# Patient Record
Sex: Male | Born: 1965 | Race: White | Hispanic: No | Marital: Married | Smoking: Former smoker
Health system: Southern US, Community
[De-identification: ages and names within clinical notes are randomized; demographics above are authoritative.]

## PROBLEM LIST (undated history)

## (undated) DIAGNOSIS — F32A Depression, unspecified: Secondary | ICD-10-CM

## (undated) DIAGNOSIS — I350 Nonrheumatic aortic (valve) stenosis: Secondary | ICD-10-CM

## (undated) DIAGNOSIS — R0600 Dyspnea, unspecified: Secondary | ICD-10-CM

## (undated) DIAGNOSIS — I2510 Atherosclerotic heart disease of native coronary artery without angina pectoris: Secondary | ICD-10-CM

## (undated) DIAGNOSIS — Z9109 Other allergy status, other than to drugs and biological substances: Secondary | ICD-10-CM

## (undated) DIAGNOSIS — I77810 Thoracic aortic ectasia: Secondary | ICD-10-CM

## (undated) DIAGNOSIS — I779 Disorder of arteries and arterioles, unspecified: Secondary | ICD-10-CM

## (undated) DIAGNOSIS — E669 Obesity, unspecified: Secondary | ICD-10-CM

## (undated) DIAGNOSIS — I471 Supraventricular tachycardia, unspecified: Secondary | ICD-10-CM

## (undated) DIAGNOSIS — G4730 Sleep apnea, unspecified: Secondary | ICD-10-CM

## (undated) DIAGNOSIS — E7800 Pure hypercholesterolemia, unspecified: Secondary | ICD-10-CM

## (undated) DIAGNOSIS — I739 Peripheral vascular disease, unspecified: Secondary | ICD-10-CM

## (undated) DIAGNOSIS — I10 Essential (primary) hypertension: Secondary | ICD-10-CM

## (undated) DIAGNOSIS — I499 Cardiac arrhythmia, unspecified: Secondary | ICD-10-CM

## (undated) DIAGNOSIS — R519 Headache, unspecified: Secondary | ICD-10-CM

## (undated) DIAGNOSIS — E119 Type 2 diabetes mellitus without complications: Secondary | ICD-10-CM

## (undated) DIAGNOSIS — M1990 Unspecified osteoarthritis, unspecified site: Secondary | ICD-10-CM

## (undated) DIAGNOSIS — R011 Cardiac murmur, unspecified: Secondary | ICD-10-CM

## (undated) DIAGNOSIS — F4310 Post-traumatic stress disorder, unspecified: Secondary | ICD-10-CM

## (undated) HISTORY — DX: Thoracic aortic ectasia: I77.810

## (undated) HISTORY — DX: Other allergy status, other than to drugs and biological substances: Z91.09

## (undated) HISTORY — DX: Nonrheumatic aortic (valve) stenosis: I35.0

## (undated) HISTORY — DX: Atherosclerotic heart disease of native coronary artery without angina pectoris: I25.10

## (undated) HISTORY — PX: TONSILLECTOMY: SUR1361

## (undated) HISTORY — DX: Obesity, unspecified: E66.9

## (undated) HISTORY — PX: OTHER SURGICAL HISTORY: SHX169

---

## 2007-05-31 DIAGNOSIS — N529 Male erectile dysfunction, unspecified: Secondary | ICD-10-CM | POA: Insufficient documentation

## 2007-06-01 DIAGNOSIS — D751 Secondary polycythemia: Secondary | ICD-10-CM | POA: Insufficient documentation

## 2010-04-11 ENCOUNTER — Emergency Department (HOSPITAL_COMMUNITY): Admission: EM | Admit: 2010-04-11 | Discharge: 2010-04-11 | Payer: Self-pay | Admitting: Emergency Medicine

## 2010-09-19 LAB — BASIC METABOLIC PANEL
BUN: 23 mg/dL (ref 6–23)
CO2: 22 mEq/L (ref 19–32)
Calcium: 9.5 mg/dL (ref 8.4–10.5)
Chloride: 104 mEq/L (ref 96–112)
Creatinine, Ser: 1.16 mg/dL (ref 0.4–1.5)
GFR calc Af Amer: >60 mL/min (ref >60)
GFR calc non Af Amer: >60 mL/min (ref >60)
Glucose, Bld: 127 mg/dL — ABNORMAL HIGH (ref 70–99)
Potassium: 3.9 mEq/L (ref 3.5–5.1)
Sodium: 136 mEq/L (ref 135–145)

## 2010-09-19 LAB — DIFFERENTIAL
Basophils Absolute: 0 10*3/uL (ref 0.0–0.1)
Basophils Relative: 0 % (ref 0–1)
Eosinophils Absolute: 0.1 10*3/uL (ref 0.0–0.7)
Eosinophils Relative: 1 % (ref 0–5)
Lymphocytes Relative: 30 % (ref 12–46)
Lymphs Abs: 3.7 10*3/uL (ref 0.7–4.0)
Monocytes Absolute: 0.9 10*3/uL (ref 0.1–1.0)
Monocytes Relative: 8 % (ref 3–12)
Neutro Abs: 7.6 10*3/uL (ref 1.7–7.7)
Neutrophils Relative %: 61 % (ref 43–77)

## 2010-09-19 LAB — CBC
HCT: 46.3 % (ref 39.0–52.0)
Hemoglobin: 16 g/dL (ref 13.0–17.0)
MCH: 31.7 pg (ref 26.0–34.0)
MCHC: 34.6 g/dL (ref 30.0–36.0)
MCV: 91.5 fL (ref 78.0–100.0)
Platelets: 254 10*3/uL (ref 150–400)
RBC: 5.05 MIL/uL (ref 4.22–5.81)
RDW: 14.1 % (ref 11.5–15.5)
WBC: 12.4 10*3/uL — ABNORMAL HIGH (ref 4.0–10.5)

## 2010-09-19 LAB — CK TOTAL AND CKMB (NOT AT ARMC)
CK, MB: 3.6 ng/mL (ref 0.3–4.0)
Relative Index: 1 (ref 0.0–2.5)
Total CK: 350 U/L — ABNORMAL HIGH (ref 7–232)

## 2010-09-19 LAB — TROPONIN I: Troponin I: 0.04 ng/mL (ref 0.00–0.06)

## 2010-12-13 ENCOUNTER — Emergency Department (HOSPITAL_COMMUNITY)
Admission: EM | Admit: 2010-12-13 | Discharge: 2010-12-14 | Disposition: A | Discharge status: Home / Self Care | Payer: BC Managed Care – PPO | Attending: Emergency Medicine | Admitting: Emergency Medicine

## 2010-12-13 DIAGNOSIS — I498 Other specified cardiac arrhythmias: Secondary | ICD-10-CM | POA: Insufficient documentation

## 2010-12-13 DIAGNOSIS — E7800 Pure hypercholesterolemia, unspecified: Secondary | ICD-10-CM | POA: Insufficient documentation

## 2010-12-13 DIAGNOSIS — I10 Essential (primary) hypertension: Secondary | ICD-10-CM | POA: Insufficient documentation

## 2010-12-13 DIAGNOSIS — R079 Chest pain, unspecified: Secondary | ICD-10-CM | POA: Insufficient documentation

## 2010-12-13 DIAGNOSIS — R61 Generalized hyperhidrosis: Secondary | ICD-10-CM | POA: Insufficient documentation

## 2010-12-13 LAB — POCT I-STAT, CHEM 8
BUN: 21 mg/dL (ref 6–23)
Calcium, Ion: 1.12 mmol/L (ref 1.12–1.32)
Chloride: 103 mEq/L (ref 96–112)
Creatinine, Ser: 1.4 mg/dL (ref 0.4–1.5)
Glucose, Bld: 123 mg/dL — ABNORMAL HIGH (ref 70–99)
HCT: 47 % (ref 39.0–52.0)
Hemoglobin: 16 g/dL (ref 13.0–17.0)
Potassium: 3.6 mEq/L (ref 3.5–5.1)
Sodium: 139 mEq/L (ref 135–145)
TCO2: 24 mmol/L (ref 0–100)

## 2011-06-03 ENCOUNTER — Emergency Department (HOSPITAL_BASED_OUTPATIENT_CLINIC_OR_DEPARTMENT_OTHER)
Admission: EM | Admit: 2011-06-03 | Discharge: 2011-06-03 | Disposition: A | Discharge status: Home / Self Care | Payer: BC Managed Care – PPO | Attending: Emergency Medicine | Admitting: Emergency Medicine

## 2011-06-03 ENCOUNTER — Encounter: Payer: Self-pay | Admitting: Student

## 2011-06-03 DIAGNOSIS — Z79899 Other long term (current) drug therapy: Secondary | ICD-10-CM | POA: Insufficient documentation

## 2011-06-03 DIAGNOSIS — I471 Supraventricular tachycardia: Secondary | ICD-10-CM

## 2011-06-03 DIAGNOSIS — I498 Other specified cardiac arrhythmias: Secondary | ICD-10-CM | POA: Insufficient documentation

## 2011-06-03 DIAGNOSIS — I10 Essential (primary) hypertension: Secondary | ICD-10-CM | POA: Insufficient documentation

## 2011-06-03 DIAGNOSIS — E7800 Pure hypercholesterolemia, unspecified: Secondary | ICD-10-CM | POA: Insufficient documentation

## 2011-06-03 HISTORY — DX: Essential (primary) hypertension: I10

## 2011-06-03 HISTORY — DX: Pure hypercholesterolemia, unspecified: E78.00

## 2011-06-03 HISTORY — DX: Supraventricular tachycardia: I47.1

## 2011-06-03 HISTORY — DX: Supraventricular tachycardia, unspecified: I47.10

## 2011-06-03 LAB — BASIC METABOLIC PANEL
BUN: 20 mg/dL (ref 6–23)
CO2: 25 mEq/L (ref 19–32)
Calcium: 9.5 mg/dL (ref 8.4–10.5)
Chloride: 101 mEq/L (ref 96–112)
Creatinine, Ser: 1 mg/dL (ref 0.50–1.35)
GFR calc Af Amer: >90 mL/min (ref >90)
GFR calc non Af Amer: 89 mL/min — ABNORMAL LOW (ref >90)
Glucose, Bld: 127 mg/dL — ABNORMAL HIGH (ref 70–99)
Potassium: 3.8 mEq/L (ref 3.5–5.1)
Sodium: 137 mEq/L (ref 135–145)

## 2011-06-03 LAB — CBC
HCT: 46.5 % (ref 39.0–52.0)
Hemoglobin: 16.2 g/dL (ref 13.0–17.0)
MCH: 30.5 pg (ref 26.0–34.0)
MCHC: 34.8 g/dL (ref 30.0–36.0)
MCV: 87.6 fL (ref 78.0–100.0)
Platelets: 216 10*3/uL (ref 150–400)
RBC: 5.31 MIL/uL (ref 4.22–5.81)
RDW: 13.5 % (ref 11.5–15.5)
WBC: 10.3 10*3/uL (ref 4.0–10.5)

## 2011-06-03 LAB — DIFFERENTIAL
Basophils Absolute: 0.1 10*3/uL (ref 0.0–0.1)
Basophils Relative: 1 % (ref 0–1)
Eosinophils Absolute: 0.2 10*3/uL (ref 0.0–0.7)
Eosinophils Relative: 2 % (ref 0–5)
Lymphocytes Relative: 28 % (ref 12–46)
Lymphs Abs: 2.9 10*3/uL (ref 0.7–4.0)
Monocytes Absolute: 1 10*3/uL (ref 0.1–1.0)
Monocytes Relative: 10 % (ref 3–12)
Neutro Abs: 6.2 10*3/uL (ref 1.7–7.7)
Neutrophils Relative %: 60 % (ref 43–77)

## 2011-06-03 LAB — TROPONIN I: Troponin I: <0.3 ng/mL (ref <0.30)

## 2011-06-03 MED ORDER — DILTIAZEM HCL ER COATED BEADS 180 MG PO CP24: 180.0000 mg | ORAL_CAPSULE | Freq: Every day | ORAL | Status: DC

## 2011-06-03 MED ORDER — ADENOSINE 6 MG/2ML IV SOLN
6.0000 mg | Freq: Once | INTRAVENOUS | Status: AC
  Administered 2011-06-03: 6 mg via INTRAVENOUS

## 2011-06-03 MED ORDER — ADENOSINE 6 MG/2ML IV SOLN
INTRAVENOUS | Status: AC
  Administered 2011-06-03: 6 mg via INTRAVENOUS
  Filled 2011-06-03: qty 2

## 2011-06-03 MED ORDER — SODIUM CHLORIDE 0.9 % IV BOLUS (SEPSIS)
1000.0000 mL | Freq: Once | INTRAVENOUS | Status: AC
  Administered 2011-06-03 (×2): 1000 mL via INTRAVENOUS

## 2011-06-03 NOTE — ED Notes (Signed)
Pt. Reports no pain and is in no resp. Distress.  Pt. Is SR on monitor with no noted ectopy.

## 2011-06-03 NOTE — ED Notes (Signed)
Pt in with c/o SVT starting at 1440 today and reports + cardiac hx of SVT. Take cardizem daily.

## 2011-06-03 NOTE — ED Notes (Signed)
MD at bedside. Crash cart at bedside. Pt placed on monitors with alarms set.

## 2011-06-03 NOTE — ED Provider Notes (Signed)
History     CSN: [id number] Arrival date & time: 06/03/2011  3:20 PM   First MD Initiated Contact with Patient 06/03/11 1521      Chief Complaint  Patient presents with  . Irregular Heart Beat    (Consider location/radiation/quality/duration/timing/severity/associated sxs/prior treatment) HPI Comments: Patient presents with palpitations, tightness in the chest and shortness of breath for the past hour period. 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  He has had SVT in the past and follows with Dr. Katrinka Blazing of [facility] cardiovascular. His last episode was in June he did convert with adenosine. Denies any cough or fever. He states he has a slight twinge of chest tightness doubt this time is in no distress. Denies any nausea, vomiting, abdominal pain. He states his Cardizem was recently increased to 189 mg from 120 but he has not received the new prescription yet.  The history is provided by the patient.    Past Medical History  Diagnosis Date  . SVT (supraventricular tachycardia)   . Hypertension   . Hypercholesterolemia     Past Surgical History  Procedure Date  . Tonsillectomy     History reviewed. No pertinent family history.  History  Substance Use Topics  . Smoking status: Never Smoker   . Smokeless tobacco: Not on file  . Alcohol Use: Yes      Review of Systems  Constitutional: Negative for fever, activity change and appetite change.  HENT: Negative for congestion and rhinorrhea.   Respiratory: Positive for chest tightness. Negative for cough and shortness of breath.   Cardiovascular: Positive for chest pain and palpitations.  Gastrointestinal: Negative for nausea, vomiting and abdominal pain.  Genitourinary: Negative for dysuria and hematuria.  Musculoskeletal: Negative for back pain.  Skin: Negative for rash.  Neurological: Negative for weakness and headaches.    Allergies  Tomato  Home Medications   Current  Outpatient Rx  Name Route Sig Dispense Refill  . ATORVASTATIN CALCIUM 40 MG PO TABS Oral Take 40 mg by mouth daily.      [name] DILTIAZEM HCL COATED BEADS 120 MG PO CP24 Oral Take 120 mg by mouth daily.      [name] DILTIAZEM HCL COATED BEADS 180 MG PO CP24 Oral Take 180 mg by mouth daily.      [name] HYDROCHLOROTHIAZIDE 12.5 MG PO CAPS Oral Take 12.5 mg by mouth daily.      [name] LISINOPRIL 40 MG PO TABS Oral Take 40 mg by mouth daily.      [name] ONE-DAILY MULTI VITAMINS PO TABS Oral Take 1 tablet by mouth daily.      [name] VITAMIN C 500 MG PO TABS Oral Take 500 mg by mouth daily.      [name] DILTIAZEM HCL COATED BEADS 180 MG PO CP24 Oral Take 1 capsule (180 mg total) by mouth daily. 7 capsule 0    BP 120/77  Pulse 77  Temp(Src) 98.1 F (36.7 C) (Oral)  Resp 20  Wt 265 lb (120.203 kg)  SpO2 99%  Physical Exam  Constitutional: He is oriented to person, place, and time. He appears well-developed and well-nourished. He appears distressed.  HENT:  Head: Normocephalic and atraumatic.  Mouth/Throat: Oropharynx is clear and moist.  Eyes: Conjunctivae are normal. Pupils are equal, round, and reactive to light.  Neck: Normal range of motion.  Cardiovascular: Normal rate, regular rhythm and normal heart sounds.        Tachycardic regular rhythm  Pulmonary/Chest: Effort normal  and breath sounds normal.  Abdominal: Soft. There is no tenderness. There is no rebound.  Musculoskeletal: Normal range of motion. He exhibits no edema and no tenderness.  Neurological: He is alert and oriented to person, place, and time. No cranial nerve deficit.  Skin: Skin is warm.    ED Course  Procedures (including critical care time)  Labs Reviewed  BASIC METABOLIC PANEL - Abnormal; Notable for the following:    Glucose, Bld 127 (*)    GFR calc non Af Amer 89 (*)    All other components within normal limits  CBC  DIFFERENTIAL  TROPONIN I   No results found.   1. Supraventricular tachycardia       MDM  Palpitations with  history of SVT. Vital signs stable at this time. SVT confirmed on EKG patient given 6 mg of adenosine and converted to normal sinus rhythm.   Date: 06/03/2011  Rate: 186  Rhythm: supraventricular tachycardia (SVT)  QRS Axis: normal  Intervals: normal  ST/T Wave abnormalities: nonspecific ST/T changes  Conduction Disutrbances:none  Narrative Interpretation:   Old EKG Reviewed: unchanged   Date: 06/03/2011  Rate: 89  Rhythm: normal sinus rhythm  QRS Axis: normal  Intervals: normal  ST/T Wave abnormalities: nonspecific ST/T changes  Conduction Disutrbances:none  Narrative Interpretation:   Old EKG Reviewed: changes noted  Patient remains in NSR with stable vitals and asymptomatic.  D/w Dr. Mayford Knife of [place].  Advises patient to start 180 mg of cardizem, continue other meds and follow up with Dr. Katrinka Blazing.  CRITICAL CARE Performed by: Glynn Octave   Total critical care time: 30  Critical care time was exclusive of separately billable procedures and treating other patients.  Critical care was necessary to treat or prevent imminent or life-threatening deterioration.  Critical care was time spent personally by me on the following activities: development of treatment plan with patient and/or surrogate as well as nursing, discussions with consultants, evaluation of patient's response to treatment, examination of patient, obtaining history from patient or surrogate, ordering and performing treatments and interventions, ordering and review of laboratory studies, ordering and review of radiographic studies, pulse oximetry and re-evaluation of patient's condition.         Glynn Octave, MD 06/03/11 [phone]

## 2011-06-03 NOTE — ED Notes (Signed)
Pt. Received adenosine 6mg  and bolused NS after infusion of Adenosine.  Pt. Converted to SR rate of 89 and was at a rate of 179 in SVT on cardiac monitor.  Pt. Is alert and oriented with no distress noted.  HR 87 sat on 3 liters is 98% and b/p is 134 /79

## 2011-06-03 NOTE — ED Notes (Signed)
Pt. In no distress.  Mother at bedside of Pt.  Cardiac monitor reveals SR with no ectopy

## 2011-08-04 ENCOUNTER — Ambulatory Visit (HOSPITAL_BASED_OUTPATIENT_CLINIC_OR_DEPARTMENT_OTHER): Payer: BC Managed Care – PPO | Attending: Otolaryngology | Admitting: Radiology

## 2011-08-04 VITALS — Ht 73.0 in | Wt 265.0 lb

## 2011-08-04 DIAGNOSIS — G4733 Obstructive sleep apnea (adult) (pediatric): Secondary | ICD-10-CM | POA: Insufficient documentation

## 2011-08-08 ENCOUNTER — Other Ambulatory Visit: Payer: Self-pay | Admitting: *Deleted

## 2011-08-08 ENCOUNTER — Encounter: Payer: Self-pay | Admitting: *Deleted

## 2011-08-08 ENCOUNTER — Ambulatory Visit (INDEPENDENT_AMBULATORY_CARE_PROVIDER_SITE_OTHER): Payer: BC Managed Care – PPO | Admitting: Internal Medicine

## 2011-08-08 ENCOUNTER — Encounter: Payer: Self-pay | Admitting: Internal Medicine

## 2011-08-08 DIAGNOSIS — I471 Supraventricular tachycardia, unspecified: Secondary | ICD-10-CM | POA: Insufficient documentation

## 2011-08-08 DIAGNOSIS — I350 Nonrheumatic aortic (valve) stenosis: Secondary | ICD-10-CM | POA: Insufficient documentation

## 2011-08-08 DIAGNOSIS — I498 Other specified cardiac arrhythmias: Secondary | ICD-10-CM

## 2011-08-08 DIAGNOSIS — I359 Nonrheumatic aortic valve disorder, unspecified: Secondary | ICD-10-CM

## 2011-08-08 NOTE — Assessment & Plan Note (Signed)
The patient has SVT. I initially thought it was A-V reentry but now by ECG I cannot be sure. Given the likelihood in a male is being A-V reentry and high probability if that is the case that it is on the left side, I discussed with Dr. Ladona Ridgel he is doing the procedure because of the potential need for transseptal access. The patient and he are agreeable. We have reviewed potential benefits as well as risks including but not limited to death perforation recurrence and heart block requiring pacemaker implantation he understands these risks and is willing to proceed.

## 2011-08-08 NOTE — Patient Instructions (Signed)
See attached instruction sheet for ablation on 08-21-2011

## 2011-08-08 NOTE — Progress Notes (Signed)
History and Physical  Patient ID: Dalton [name] MRN: [id number], SOB: 1966/01/08 45 y.o. Date of Encounter: 08/08/2011, 3:40 PM  Primary Physician: No primary provider on file. Primary Cardiologist: smith Primary Electrophysiologist:  Graciela Husbands  Chief Complaint: svt  History of Present Illness: Dalton [name] is a 46 y.o. male with a bicuspid aortic valve and moderate aortic stenosis who presents with recurrent SVT over the last 6 months or so. He's had 3 visits to the emergency room for this. They're associated with dyspnea chest tightness and lightheadedness. It converted with adenosine. There frog negative. He does get a pounding in his head.   Past Medical History  Diagnosis Date  . SVT (supraventricular tachycardia)   . Hypertension   . Hypercholesterolemia   . Aortic valve stenosis     Bicuspid     Past Surgical History  Procedure Date  . Tonsillectomy   . Surgery for non descending testicle       Current Outpatient Prescriptions  Medication Sig Dispense Refill  . atorvastatin (LIPITOR) 40 MG tablet Take 40 mg by mouth daily.        [name] diltiazem (DILACOR XR) 240 MG 24 hr capsule Take 240 mg by mouth daily.      . hydrochlorothiazide (MICROZIDE) 12.5 MG capsule Take 12.5 mg by mouth daily.        [name] lisinopril (PRINIVIL,ZESTRIL) 40 MG tablet Take 20 mg by mouth daily.       . Lycopene 10 MG CAPS Take 1 capsule by mouth.      . Multiple Vitamin (MULTIVITAMIN) tablet Take 1 tablet by mouth daily.        . Omega-3 Fatty Acids (FISH OIL) 1000 MG CAPS Take 1 capsule by mouth daily.      . vitamin C (ASCORBIC ACID) 500 MG tablet Take 1,000 mg by mouth daily.          Allergies: Allergies  Allergen Reactions  . Tomato Hives and Itching    Only allergic to raw tomato     History  Substance Use Topics  . Smoking status: Former Smoker    Types: Cigarettes    Quit date: 08/07/2010  . Smokeless tobacco: Never Used  . Alcohol Use: Yes     RED WINE EACH EVENING,  LIQUOR OCCASIONAL      Family History  Problem Relation Age of Onset  . Diabetes    . Cancer    . Breast cancer Maternal Grandmother   . Cancer Maternal Grandfather   . Aortic stenosis Maternal Grandfather       ROS:  Please see the history of present illness.   All other systems reviewed and negative.   Vital Signs: Blood pressure 126/80, pulse 71, height 6\' 1"  (1.854 m), weight 271 lb (122.925 kg).  PHYSICAL EXAM: General:  Well nourished, well developed male in no acute distress HEENT: normal Lymph: no adenopathy Neck: no JVD Endocrine:  No thryomegaly Vascular: No carotid bruits; upstroke mildly delayed FA pulses 2+ bilaterally without bruits Cardiac:  normal S1, S2; RRR; 2-3/6 systolic murmur increases with variable R-R interval   Back: without kyphosis/scoliosis, no CVA tenderness Lungs:  clear to auscultation bilaterally, no wheezing, rhonchi or rales Abd: soft, nontender, no hepatomegaly Ext: no edema Musculoskeletal:  No deformities, BUE and BLE strength normal and equal Skin: warm and dry Neuro:  CNs 2-12 intact, no focal abnormalities noted Psych:  Normal affect   EKG:  Sinus rhythm at 71 intervals 0.15/0.10/0.44 Axis is 81  Tachycardia ECG November 27 demonstrates R-R interval of 320 ms there is a suggestion of an R. prime in lead V1 there is no effacement of the Q waves in the inferior leads and there is a suggestion of a P wave in the proximal portion of the ST segment in lead 3  Labs:   Lab Results  Component Value Date   WBC 10.3 06/03/2011   HGB 16.2 06/03/2011   HCT 46.5 06/03/2011   MCV 87.6 06/03/2011   PLT 216 06/03/2011   No results found for this basename: NA,K,CL,CO2,BUN,CREATININE,CALCIUM,LABALBU,PROT,BILITOT,ALKPHOS,ALT,AST,GLUCOSE in the last 168 hours No results found for this basename: CKTOTAL:4,CKMB:4,TROPONINI:4 in the last 72 hours No results found for this basename: CHOL,  HDL,  LDLCALC,  TRIG   No results found for this basename:  DDIMER   BNP No results found for this basename: probnp       ASSESSMENT AND PLAN:

## 2011-08-08 NOTE — Assessment & Plan Note (Signed)
As above.

## 2011-08-10 DIAGNOSIS — R0609 Other forms of dyspnea: Secondary | ICD-10-CM

## 2011-08-10 DIAGNOSIS — G4733 Obstructive sleep apnea (adult) (pediatric): Secondary | ICD-10-CM

## 2011-08-10 DIAGNOSIS — R0989 Other specified symptoms and signs involving the circulatory and respiratory systems: Secondary | ICD-10-CM

## 2011-08-11 ENCOUNTER — Encounter (HOSPITAL_COMMUNITY): Payer: Self-pay | Admitting: Respiratory Therapy

## 2011-08-11 NOTE — Procedures (Signed)
NAME:  Dalton [name], Dalton [name] NO.:  [id number]  MEDICAL RECORD NO.:  [id number]          PATIENT TYPE:  OUT  LOCATION:  SLEEP CENTER                 FACILITY:  [facility]  PHYSICIAN:  Nolon Yellin D. Maple Hudson, MD, FCCP, FACPDATE OF BIRTH:  1966/06/17  DATE OF STUDY:  08/04/2011                           NOCTURNAL POLYSOMNOGRAM  REFERRING PHYSICIAN:  Hermelinda Medicus, M.D.  REFERRING PHYSICIAN:  Hermelinda Medicus, M.D.  INDICATION FOR STUDY:  Hypersomnia with sleep apnea.  EPWORTH SLEEPINESS SCORE:  11/24.  BMI 35. Weight 265 pounds, height 73 inches, neck 17.5 inches.  HOME MEDICATIONS:  Charted and reviewed.  SLEEP ARCHITECTURE:  Total sleep time 292.5 minutes with sleep efficiency 76.1%.  Stage I was 3.1%, stage II 78.8%, and stage III 0.2%. REM 17.9% of total sleep time.  Sleep latency 88.5 minutes.  REM latency 118.5 minutes.  Awake after sleep onset 3.5 minutes.  Arousal index 49.4.  BEDTIME MEDICATIONS:  None.  RESPIRATORY DATA:  Apnea-hypopnea index (AHI) 54.6 per hour.  A total of 266 events was scored including 110 obstructive apneas, 22 central apneas, 35 mixed apneas, and 99 hypopneas.  Events were not positional. REM AHI 14.9 per hour.  This was a diagnostic NPSG protocol as ordered, CPAP titration not done.  OXYGEN DATA:  Moderate snoring with oxygen desaturation to a nadir of 83% and a mean oxygen saturation through the study of 94.2% on room air.  CARDIAC DATA:  Sinus rhythm with occasional PVC.  MOVEMENT/PARASOMNIA:  No significant movement disturbance.  No bathroom trips.  IMPRESSION/RECOMMENDATION:  Severe obstructive sleep apnea/hypopnea syndrome, apnea-hypopnea index 54.6 per hour with non positional events. Moderate snoring and oxygen desaturation to a nadir of 83% and a mean oxygen saturation through the study of 94.2% on room air.     Kattaleya Alia D. Maple Hudson, MD, [facility], FACP Diplomate, Biomedical engineer of Sleep Medicine Electronically Signed    CDY/MEDQ   D:  08/10/2011 18:15:27  T:  08/11/2011 03:48:35  Job:  [id number]

## 2011-08-14 ENCOUNTER — Other Ambulatory Visit (INDEPENDENT_AMBULATORY_CARE_PROVIDER_SITE_OTHER): Payer: BC Managed Care – PPO | Admitting: *Deleted

## 2011-08-14 DIAGNOSIS — I471 Supraventricular tachycardia: Secondary | ICD-10-CM

## 2011-08-14 DIAGNOSIS — I498 Other specified cardiac arrhythmias: Secondary | ICD-10-CM

## 2011-08-14 LAB — BASIC METABOLIC PANEL
BUN: 15 mg/dL (ref 6–23)
CO2: 29 mEq/L (ref 19–32)
Calcium: 9.5 mg/dL (ref 8.4–10.5)
Chloride: 104 mEq/L (ref 96–112)
Creatinine, Ser: 1.1 mg/dL (ref 0.4–1.5)
GFR: 73.54 mL/min (ref >60.00)
Glucose, Bld: 128 mg/dL — ABNORMAL HIGH (ref 70–99)
Potassium: 4.7 mEq/L (ref 3.5–5.1)
Sodium: 138 mEq/L (ref 135–145)

## 2011-08-14 LAB — CBC WITH DIFFERENTIAL/PLATELET
Basophils Absolute: 0 10*3/uL (ref 0.0–0.1)
Basophils Relative: 0.6 % (ref 0.0–3.0)
Eosinophils Absolute: 0.1 10*3/uL (ref 0.0–0.7)
Eosinophils Relative: 1.4 % (ref 0.0–5.0)
HCT: 44.9 % (ref 39.0–52.0)
Hemoglobin: 15.3 g/dL (ref 13.0–17.0)
Lymphocytes Relative: 31.5 % (ref 12.0–46.0)
Lymphs Abs: 2 10*3/uL (ref 0.7–4.0)
MCHC: 34.1 g/dL (ref 30.0–36.0)
MCV: 92.3 fl (ref 78.0–100.0)
Monocytes Absolute: 0.5 10*3/uL (ref 0.1–1.0)
Monocytes Relative: 7.5 % (ref 3.0–12.0)
Neutro Abs: 3.8 10*3/uL (ref 1.4–7.7)
Neutrophils Relative %: 59 % (ref 43.0–77.0)
Platelets: 172 10*3/uL (ref 150.0–400.0)
RBC: 4.86 Mil/uL (ref 4.22–5.81)
RDW: 14 % (ref 11.5–14.6)
WBC: 6.4 10*3/uL (ref 4.5–10.5)

## 2011-08-14 LAB — PROTIME-INR
INR: 1 ratio (ref 0.8–1.0)
Prothrombin Time: 10.5 s (ref 10.2–12.4)

## 2011-08-21 ENCOUNTER — Encounter (HOSPITAL_COMMUNITY): Payer: Self-pay | Admitting: General Practice

## 2011-08-21 ENCOUNTER — Encounter (HOSPITAL_COMMUNITY)
Admission: RE | Disposition: A | Discharge status: Home / Self Care | Payer: Self-pay | Source: Ambulatory Visit | Attending: Internal Medicine

## 2011-08-21 ENCOUNTER — Ambulatory Visit (HOSPITAL_COMMUNITY)
Admission: RE | Admit: 2011-08-21 | Discharge: 2011-08-22 | Disposition: A | Discharge status: Home / Self Care | Payer: BC Managed Care – PPO | Source: Ambulatory Visit | Attending: Internal Medicine | Admitting: Internal Medicine

## 2011-08-21 DIAGNOSIS — I471 Supraventricular tachycardia, unspecified: Secondary | ICD-10-CM | POA: Insufficient documentation

## 2011-08-21 DIAGNOSIS — R0989 Other specified symptoms and signs involving the circulatory and respiratory systems: Secondary | ICD-10-CM | POA: Insufficient documentation

## 2011-08-21 DIAGNOSIS — I498 Other specified cardiac arrhythmias: Secondary | ICD-10-CM | POA: Insufficient documentation

## 2011-08-21 DIAGNOSIS — I359 Nonrheumatic aortic valve disorder, unspecified: Secondary | ICD-10-CM | POA: Insufficient documentation

## 2011-08-21 DIAGNOSIS — R0609 Other forms of dyspnea: Secondary | ICD-10-CM | POA: Insufficient documentation

## 2011-08-21 DIAGNOSIS — E7800 Pure hypercholesterolemia, unspecified: Secondary | ICD-10-CM | POA: Insufficient documentation

## 2011-08-21 DIAGNOSIS — I10 Essential (primary) hypertension: Secondary | ICD-10-CM | POA: Insufficient documentation

## 2011-08-21 HISTORY — PX: CARDIAC ELECTROPHYSIOLOGY STUDY AND ABLATION: SHX1294

## 2011-08-21 HISTORY — PX: SUPRAVENTRICULAR TACHYCARDIA ABLATION: SHX5492

## 2011-08-21 HISTORY — DX: Cardiac murmur, unspecified: R01.1

## 2011-08-21 SURGERY — SUPRAVENTRICULAR TACHYCARDIA ABLATION
Anesthesia: LOCAL

## 2011-08-21 MED ORDER — BUPIVACAINE HCL (PF) 0.25 % IJ SOLN
INTRAMUSCULAR | Status: AC
  Filled 2011-08-21: qty 30

## 2011-08-21 MED ORDER — HYDROXYUREA 500 MG PO CAPS
ORAL_CAPSULE | ORAL | Status: AC
  Filled 2011-08-21: qty 1

## 2011-08-21 MED ORDER — HEPARIN (PORCINE) IN NACL 2-0.9 UNIT/ML-% IJ SOLN
INTRAMUSCULAR | Status: AC
  Filled 2011-08-21: qty 1000

## 2011-08-21 MED ORDER — LIDOCAINE HCL (PF) 1 % IJ SOLN
INTRAMUSCULAR | Status: AC
  Filled 2011-08-21: qty 30

## 2011-08-21 MED ORDER — FENTANYL CITRATE 0.05 MG/ML IJ SOLN
INTRAMUSCULAR | Status: AC
  Filled 2011-08-21: qty 2

## 2011-08-21 MED ORDER — SODIUM CHLORIDE 0.9 % IV SOLN
250.0000 mL | INTRAVENOUS | Status: AC
  Administered 2011-08-21: 20 mL via INTRAVENOUS

## 2011-08-21 MED ORDER — MIDAZOLAM HCL 5 MG/5ML IJ SOLN
INTRAMUSCULAR | Status: AC
  Filled 2011-08-21: qty 5

## 2011-08-21 MED ORDER — ONDANSETRON HCL 4 MG/2ML IJ SOLN: 4.0000 mg | Freq: Four times a day (QID) | INTRAMUSCULAR | Status: DC | PRN

## 2011-08-21 MED ORDER — ACETAMINOPHEN 325 MG PO TABS: 650.0000 mg | ORAL_TABLET | ORAL | Status: DC | PRN

## 2011-08-21 MED ORDER — SODIUM CHLORIDE 0.45 % IV SOLN
INTRAVENOUS | Status: DC
  Administered 2011-08-21: 10:00:00 via INTRAVENOUS

## 2011-08-21 MED ORDER — LISINOPRIL 20 MG PO TABS
20.0000 mg | ORAL_TABLET | Freq: Every day | ORAL | Status: DC
  Administered 2011-08-21 – 2011-08-22 (×2): 20 mg via ORAL
  Filled 2011-08-21 (×2): qty 1

## 2011-08-21 MED ORDER — OXYCODONE HCL 5 MG PO TABS: 5.0000 mg | ORAL_TABLET | ORAL | Status: DC | PRN

## 2011-08-21 NOTE — Progress Notes (Signed)
At 2000 Pt bedrest up pt and he ambulated from room and up and down the hall bilateral groins assessed when wh returned to room level O no bruising inform pt to notify staff he he feels any warmth  or pain in groin area. Ilean Skill LPN

## 2011-08-21 NOTE — H&P (Signed)
  Dalton [name] is an 46 y.o. male.   Chief Complaint: i am here for an ablation HPI:  Dalton [name] is a pleasant 46 yo man with a h/o tachypalpitations associated with SVT at 190/min. He has been on medical therapy with calcium channel blockers and had recurrent symptoms and is now admitted for EPS/RFA of SVT. He is not pre-excited at baseline. He has dyspnea when he goes into SVT.  Past Medical History  Diagnosis Date  . SVT (supraventricular tachycardia)   . Hypertension   . Hypercholesterolemia   . Aortic valve stenosis     Bicuspid    Past Surgical History  Procedure Date  . Tonsillectomy   . Surgery for non descending testicle     Family History  Problem Relation Age of Onset  . Diabetes    . Cancer    . Breast cancer Maternal Grandmother   . Cancer Maternal Grandfather   . Aortic stenosis Maternal Grandfather    Social History:  reports that he quit smoking about a year ago. His smoking use included Cigarettes. He has never used smokeless tobacco. He reports that he drinks alcohol. He reports that he uses illicit drugs.  Allergies:  Allergies  Allergen Reactions  . Tomato Hives and Itching    Only allergic to raw tomato    Medications Prior to Admission  Medication Dose Route Frequency Provider Last Rate Last Dose  . 0.45 % sodium chloride infusion   Intravenous Continuous Lewayne Bunting, MD 50 mL/hr at 08/21/11 0959    . bupivacaine (MARCAINE) 0.25 % injection           . heparin 2-0.9 UNIT/ML-% infusion            Medications Prior to Admission  Medication Sig Dispense Refill  . atorvastatin (LIPITOR) 40 MG tablet Take 40 mg by mouth daily.        . hydrochlorothiazide (MICROZIDE) 12.5 MG capsule Take 12.5 mg by mouth daily.        [name] lisinopril (PRINIVIL,ZESTRIL) 40 MG tablet Take 20 mg by mouth daily.       . Multiple Vitamin (MULTIVITAMIN) tablet Take 1 tablet by mouth daily.        . vitamin C (ASCORBIC ACID) 500 MG tablet Take 1,000 mg by mouth daily.           No results found for this or any previous visit (from the past 48 hour(s)). No results found.  @ROS @  Blood pressure 116/71, pulse 54, temperature 97.5 F (36.4 C), temperature source Oral, resp. rate 18, height 6\' 1"  (1.854 m), weight 120.203 kg (265 lb), SpO2 96.00%. Well appearing NAD HEENT: Unremarkable Neck:  No JVD, no thyromegally Lymphatics:  No adenopathy Back:  No CVA tenderness Lungs:  Clear HEART:  Regular rate rhythm, no murmurs, no rubs, no clicks Abd:  Flat, positive bowel sounds, no organomegally, no rebound, no guarding Ext:  2 plus pulses, no edema, no cyanosis, no clubbing Skin:  No rashes no nodules Neuro:  CN II through XII intact, motor grossly intact  EKG: NSR  Assessment/Plan 1. Recurrent SVT - I have discussed the indications, risks, benefits, goals, and expectations of EPS/RFA with the patient and he wishes to proceed.  Lewayne Bunting 08/21/2011, 10:37 AM

## 2011-08-22 ENCOUNTER — Other Ambulatory Visit: Payer: Self-pay

## 2011-08-22 DIAGNOSIS — I4892 Unspecified atrial flutter: Secondary | ICD-10-CM

## 2011-08-22 MED ORDER — LISINOPRIL 20 MG PO TABS: 20.0000 mg | ORAL_TABLET | Freq: Every day | ORAL | Status: DC

## 2011-08-22 NOTE — Discharge Instructions (Signed)
NO HEAVY LIFTING OR SEXUAL ACTIVITY X 7 DAYS. NO DRIVING X 2 DAYS. NO SOAKING BATHS, HOT TUBS, POOLS, ETC., X 5 DAYS. Cardiac Ablation Cardiac means related to the heart. Ablation means destruction. Cardiac ablation is a procedure to disable a small amount of heart tissue in very specific places. The heart has many electrical connections. Sometimes these connections can become abnormal and can cause the heart to beat very fast or irregularly. By disabling some of the problem areas, the heart rhythm can be improved or made normal. Ablation is done for people who:  Have Wolf-Parkinson-White syndrome.   Have other fast heart rhythms known as tachycardia.   Have tried medications for an abnormal heart rhythm that resulted in:   No success.   Side effects.   May have a high risk heart beat (arrhythmia) that could result in death.  LET YOUR CAREGIVER KNOW ABOUT:   All previous heart procedures, such as:   Surgeries.   Tests.   Heart conditions.   Allergies or previous reactions to:   Food.   Medicine.   Tape.  RISKS AND COMPLICATIONS  Depending on how long it takes to do the ablation, the dose of radiation can be high. This can increase the risk of cancer.   Bruising and bleeding where the catheter was inserted.   Bleeding into the chest, especially into the sack that surrounds the heart, is a serious complication that sometimes occurs.   A permanent pacemaker may be needed if the normal electrical system is damaged.   The procedure may not be fully effective, and this may not be recognized for months. Repeat ablations are sometimes required.  BEFORE THE PROCEDURE   Let your caregiver know if:   You have an allergy to X-ray dye or seafood.   If you have kidney failure.   If you have had a kidney transplant.   Follow your caregiver's instructions regarding eating and drinking before the procedure.   Take your medications as directed at regular times with water unless  instructed otherwise. If you are on insulin, ask how you are to take it and if there are special instructions. It is common to adjust insulin dosing the day of the ablation.  PROCEDURE  An ablation is usually performed in a catheterization laboratory with the guidance of fluoroscopy. Fluoroscopy is a type of X-ray that helps your caregiver see images of your heart during the procedure.   An IV will be started before the procedure begins. You will be given a sedative to help you relax.   An ablation is a minimally invasive procedure. This means a small cut (incision) is made in either your neck or groin. Your caregiver will decide where to make the incision based on your medical history and physical exam.   The skin on your neck or groin will be numbed. A needle will be inserted into a large vein in your neck or groin and a thin, flexible tube called a catheter will be threaded to your heart.   A special dye that shows up on fluoroscopy pictures may be injected through the catheter. The dye helps your caregiver see the area of the heart that needs treatment.   The catheter has electrodes on the tip. When the area of heart tissue that is causing the abnormal heart rhythm is found, the catheter tip will send an electrical current to the area and "scar" the tissue.   Three types of energy can be used to ablate the heart  tissue:   Heat (radiofrequency energy).   Laser energy.   Extreme cold (cryoablation).   When the area of the heart has been ablated, the catheter will be taken out. Pressure will be held on the insertion site. This will help the insertion site clot and keep it from bleeding. A bandage will be placed on the insertion site.   An ablation procedure can take 1 to 6 hours to complete.  AFTER THE PROCEDURE   After the procedure, you will be taken to a recovery area where your vital signs (blood pressure, heart rate and breathing) will be monitored. The insertion site will also be  monitored for bleeding.   You will need to lie still for 4 to 6 hours. This is to ensure you do not bleed from the catheter insertion site.   If your blood pressure and heart rate are stable and no bleeding occurs at the insertion site, you may go home the same day as your procedure.   If complications occur or your caregiver feels you should be watched, you may need to stay in the hospital overnight.  HOME CARE INSTRUCTIONS   The day following the procedure, the bandage can be removed. If the insertion site oozes a small amount of blood, such as a few teaspoons (15 ml), place a clean bandage over the insertion site.   Ask your caregiver when you may shower.   Do not submerge the insertion site in water. This means do not sit in a bathtub, hot tub or swimming pool for 5 days or as instructed by your caregiver.  SEEK MEDICAL CARE IF:   There is swelling larger than a walnut or half a lemon at the insertion site.   You develop an oral temperature of more than 100.5 F (38.1 C).   The insertion site:   Becomes red and swollen.   Is Painful.   Has drainage that is tan, yellow or green in color.  SEEK IMMEDIATE MEDICAL CARE IF:   Bleeding from the insertion site does not stop. Hold pressure to this area. Call your local emergency service (911 in the [place]) immediately!   If your insertion site was in your groin, and the leg that has the insertion site becomes:   Blue.   Pale and/or cold.   Numb.   You develop chest pain that is crushing or pressure-like.   You have difficulty breathing or shortness of breath.   You develop an oral temperature of more than 101 F (38.3 C).  MAKE SURE YOU:   Understand these instructions.   Will watch your condition.   Will get help right away if you are not doing well or get worse.  Document Released: 11/09/2008 Document Revised: 03/05/2011 Document Reviewed: 11/09/2008 [facility] Patient Information 2012 [place], [place].

## 2011-08-22 NOTE — Discharge Summary (Signed)
   CARDIOLOGY DISCHARGE SUMMARY   Patient ID: Dalton [name] MRN: [id number] DOB/AGE: 09/14/1965 46 y.o.  Admit date: 08/21/2011 Discharge date: 08/22/2011  Primary Discharge Diagnosis:  SVT Secondary Discharge Diagnosis:  Patient Active Problem List  Diagnoses  . Aortic valve stenosis  . SVT (supraventricular tachycardia)    Procedures: Electrophysiologic study and RF catheter ablation  of AV node reentrant tachycardia utilizing three-dimensional  electroanatomic mapping.   Hospital Course: [name] is a 46 year old male with a history of SVT. Ablation was recommended and he came to the hospital for the procedure on 08/21/2011.   He had the procedures described above, with results listed below. Per Dr Jenel Lucks note: This study demonstrates successful electrophysiologic study  and RF catheter ablation of AV node reentrant tachycardia with 12 RF  energy applications delivered with Koch's triangle resulting in  rendering the SVT noninducible.  On 08-22-2011, [name] was evaluated by Dr Johney Frame. He was ambulating without chest pain or SOB and is considered stable for discharge, in improved condition, to follow up as an outpatient.    FOLLOW UP PLANS AND APPOINTMENTS Discharge Orders    Future Appointments: Provider: Department: Dept Phone: Center:   09/16/2011 9:50 AM Beatrice Lecher, PA [facility] [phone] [facility]     Future Orders Please Complete By Expires   Diet - low sodium heart healthy      Increase activity slowly      Call MD for:  redness, tenderness, or signs of infection (pain, swelling, redness, odor or green/yellow discharge around incision site)        Allergies  Allergen Reactions  . Tomato Hives and Itching    Only allergic to raw tomato   Medication List  As of 08/22/2011 11:51 AM   TAKE these medications         atorvastatin 40 MG tablet   Commonly known as: LIPITOR   Take 40 mg by mouth daily.     STOP diltiazem 240 MG 24 hr  capsule   Commonly known as: DILACOR XR   Take 240 mg by mouth daily.      Fish Oil 1000 MG Caps   Take 1 capsule by mouth daily.      hydrochlorothiazide 12.5 MG capsule   Commonly known as: MICROZIDE   Take 12.5 mg by mouth daily.      lisinopril 20 MG tablet   Commonly known as: PRINIVIL,ZESTRIL   Take 1 tablet (20 mg total) by mouth daily.      Lycopene 10 MG Caps   Take 1 capsule by mouth.      multivitamin tablet   Take 1 tablet by mouth daily.      vitamin C 500 MG tablet   Commonly known as: ASCORBIC ACID   Take 1,000 mg by mouth daily.           Follow-up Information    Follow up with Tereso Newcomer, PA. (See for Dr Graciela Husbands on March 12th at  9:50 am)    Contact information:   [address] [place] [address] [phone]          BRING ALL MEDICATIONS WITH YOU TO FOLLOW UP APPOINTMENTS  Time spent with patient to include physician time: 33 min Signed: Theodore Demark 08/22/2011, 11:51 AM Co-Sign MD  I have seen, examined the patient, and reviewed the above assessment and plan.   Co Sign: Hillis Range, MD

## 2011-08-22 NOTE — Op Note (Signed)
NAMEALONDRA, SAHNI NO.:  [id number]  MEDICAL RECORD NO.:  [id number]  LOCATION:  [id number]                         FACILITY:  [facility]  PHYSICIAN:  Doylene Canning. Ladona Ridgel, MD    DATE OF BIRTH:  May 29, 1966  DATE OF PROCEDURE:  08/21/2011 DATE OF DISCHARGE:                              OPERATIVE REPORT   SURGEON:  Doylene Canning. Ladona Ridgel, MD  PROCEDURE PERFORMED:  Electrophysiologic study and RF catheter ablation of AV node reentrant tachycardia utilizing three-dimensional electroanatomic mapping.  INTRODUCTION:  The patient is a 46 year old man with a 2-year history of tachypalpitations and documented SVT at rates of 190 beats per minute. He has failed medical therapy.  He is now referred for catheter ablation.  PROCEDURE:  After informed was obtained, the patient was taken to the Diagnostic EP Lab in the fasting state.  After usual preparation and draping, intravenous fentanyl and midazolam was given for sedation.  A 6- [place] hexapolar catheter was inserted percutaneously into the right jugular vein and advanced to the coronary sinus.  A 6-French quadripolar catheter was inserted percutaneously in the right femoral vein and advanced to the His bundle region.  A 6-French quadripolar catheter was inserted percutaneously in the right femoral vein and advanced to the right ventricle.  After measurement of the basic intervals, rapid ventricular pacing was carried out from the right ventricle at a base drive cycle length of 161 milliseconds demonstrating VA dissociation. Next, programmed ventricular stimulation was carried out demonstrating VA dissociation at 600 milliseconds.  Next, programmed atrial stimulation was carried out from the right atrium and this demonstrated AV Wenckebach cycle length of 440 milliseconds.  During rapid atrial pacing, the PR interval was greater than the RR interval, but there was no inducible SVT.  Next, programmed atrial stimulation was carried  out at a base drive cycle length of 096 milliseconds.  The S1-S2 interval was stepwise decreased from 540 milliseconds down to 390 milliseconds where the AV node ERP was observed.  During programmed atrial stimulation, there were multiple AH jumps, but no inducible SVT.  At this point, isoproterenol was infused at rates of 1-4 mcg per minute and rapid atrial pacing was again carried out resulting in the initiation of SVT.  Mapping was carried out demonstrating VA interval of approximately 0 and midline atrial activation.  PVCs placed at time of the His bundle refractoriness did not preexcite the atrium and ventricular pacing demonstrated a VAV conduction sequence.  With these data, the 7-French quadripolar ablation catheter was inserted into the right femoral vein and advanced into the right atrium.  Additional mapping was carried out and 3-D electroanatomic mapping carried out.  Koch's triangle was of larger than usual.  The CS was also larger than usual.  The activation mapping was carried out during SVT.  At this point, the ablation catheter was maneuvered into Koch's triangle and 12 RF energy applications were delivered starting at site 10 going up to site 5. During RF energy application, there was very accelerated junctional rhythm worrisome for possible development of heart block and RF had to be discontinued.  At the final site, accelerated junctional rhythm was slightly slower and after this, rapid atrial pacing  was carried out demonstrating no inducible SVT.  Programmed atrial stimulation was also carried out demonstrating no inducible SVT.  The patient was observed for 20 minutes and during this time, rapid atrial pacing and programmed atrial stimulation were carried out and no inducible SVT was demonstrated.  In addition, the PR interval was less than the RR interval.  At this point, the catheters were removed, hemostasis was assured, and the patient was returned to his room in  satisfactory condition.  COMPLICATIONS:  There were no immediate procedure complications.  RESULTS:  A.  Baseline ECG:  Baseline ECG demonstrates normal sinus rhythm with normal axis and intervals.  There was no pre-excitation. B.  Baseline intervals:  The HV interval was 37 milliseconds.  The QRS duration was 94 milliseconds.  The sinus node cycle length was 1100 milliseconds. C.  Rapid ventricular pacing:  The rapid ventricular pacing was carried out from the right ventricle demonstrating VA dissociation at 600 milliseconds. D.  Programmed ventricular stimulation:  Programmed ventricular stimulation was carried out from the right ventricle demonstrating VA dissociation at 600 milliseconds. E.  Rapid atrial pacing:  Rapid atrial pacing was carried out from the coronary sinus at a base drive cycle length of 409 milliseconds.  The S1- S2 interval was stepwise decreased down to 390 milliseconds where atrial refractoriness was observed.  During programmed atrial stimulation, there were multiple AH jumps.  There was no inducible SVT.  Following initiation of isoproterenol, there was inducible SVT with programmed atrial stimulation. F.  Rapid atrial pacing:  Rapid atrial pacing was carried out from the coronary sinus and was stepwise decreased down to 440 milliseconds where AV Wenckebach was observed.  Following the initiation of isoproterenol, rapid atrial pacing was carried out resulting in the initiation of SVT at a cycle length of 300 milliseconds. G.  Arrhythmias observed. 1. AV node reentry tachycardia initiation was with rapid atrial pacing     on isoproterenol.  Duration was sustained, cycle length was 320     milliseconds.  Method of termination was with rapid atrial pacing.     a.     Mapping:  Mapping of Koch's triangle demonstrated a larger      than normal-sized Koch's triangle, which was oriented superiorly.     b.     RF energy application:  A total of 12 brief RF energy       applications were delivered resulting in accelerated junctional      rhythm and rendering the patient's SVT noninducible.  CONCLUSION:  This study demonstrates successful electrophysiologic study and RF catheter ablation of AV node reentrant tachycardia with 12 RF energy applications delivered with Koch's triangle resulting in rendering the SVT noninducible.     Doylene Canning. Ladona Ridgel, MD     GWT/MEDQ  D:  08/21/2011  T:  08/22/2011  Job:  [id number]  cc:   Duke Salvia, MD, [facility] Lyn Records, M.D.

## 2011-08-22 NOTE — Progress Notes (Signed)
SUBJECTIVE: The patient is doing well today. He denies procedure related concerns  At this time, he denies chest pain, shortness of breath, or any new concerns.     . bupivacaine      . fentaNYL      . heparin      . hydroxyurea      . lidocaine      . lisinopril  20 mg Oral Daily  . midazolam      . midazolam          . sodium chloride Stopped (08/21/11 1830)  . DISCONTD: sodium chloride 50 mL/hr at 08/21/11 0959    OBJECTIVE: Physical Exam: Filed Vitals:   08/21/11 1451 08/21/11 1648 08/21/11 1943 08/22/11 0500  BP: 122/77 121/59 121/63 103/65  Pulse: 64  65 53  Temp: 97.8 F (36.6 C)  97.6 F (36.4 C) 97.6 F (36.4 C)  TempSrc: Oral  Oral Oral  Resp: 16  16 16   Height: 6\' 1"  (1.854 m)     Weight: 268 lb 4.8 oz (121.7 kg)   275 lb 12.7 oz (125.1 kg)  SpO2: 96%  95% 97%    Intake/Output Summary (Last 24 hours) at 08/22/11 0756 Last data filed at 08/22/11 0500  Gross per 24 hour  Intake  71.33 ml  Output    400 ml  Net -328.67 ml    Telemetry reveals sinus rhythm  GEN- The patient is well appearing, alert and oriented x 3 today.   Head- normocephalic, atraumatic Eyes-  Sclera clear, conjunctiva pink Ears- hearing intact Oropharynx- clear Neck- supple, no JVP Lymph- no cervical lymphadenopathy Lungs- Clear to ausculation bilaterally, normal work of breathing Heart- Regular rate and rhythm, no murmurs, rubs or gallops, PMI not laterally displaced GI- soft, NT, ND, + BS Extremities- no clubbing, cyanosis, or edema, no hematoma/ bruits  ASSESSMENT AND PLAN:  Active Problems:  * No active hospital problems. *   1. SVT- s/p successful ablation for AVNRT DC to home Follow-up with Dr Ladona Ridgel in 4-6 weeks  Hillis Range, MD 08/22/2011 7:56 AM

## 2011-08-22 NOTE — Progress Notes (Signed)
D/c instructions given. Informed of follow-up appt and e-script sent to Pharmacy. No questions re: d/c. Only request was for a return to work note. Awaiting his ride to pick him up.

## 2011-09-16 ENCOUNTER — Ambulatory Visit (INDEPENDENT_AMBULATORY_CARE_PROVIDER_SITE_OTHER): Payer: BC Managed Care – PPO | Admitting: Physician Assistant

## 2011-09-16 ENCOUNTER — Encounter: Payer: Self-pay | Admitting: Physician Assistant

## 2011-09-16 DIAGNOSIS — I471 Supraventricular tachycardia: Secondary | ICD-10-CM

## 2011-09-16 DIAGNOSIS — I350 Nonrheumatic aortic (valve) stenosis: Secondary | ICD-10-CM

## 2011-09-16 DIAGNOSIS — I498 Other specified cardiac arrhythmias: Secondary | ICD-10-CM

## 2011-09-16 DIAGNOSIS — I359 Nonrheumatic aortic valve disorder, unspecified: Secondary | ICD-10-CM

## 2011-09-16 NOTE — Progress Notes (Signed)
  [address] [place], [place]  [address] Phone: [phone] Fax:  [phone]  Date:  09/16/2011   Name:  [name]       DOB:  1966-02-24 MRN:  [id number]  PCP:  Dr. Barbaraann Barthel Primary Cardiologist:  Dr. Garnette Scheuermann Primary Electrophysiologist:  Dr. Sherryl Manges    History of Present Illness: Dalton [name] is a 46 y.o. male who returns for post hospital follow up.  He was evaluated by Dr. Sherryl Manges 2/13 for SVT.  He has a history of aortic stenosis.  He is followed by Dr. Katrinka Blazing.  Decision was made to pursue RFCA.  This was performed by Dr. Ladona Ridgel 08/21/11.  The procedure was successful.  He returns today for post hospital followup.  He denies palpitations.  The patient denies chest pain, shortness of breath, syncope, orthopnea, PND or significant pedal edema.   Past Medical History  Diagnosis Date  . Hypertension   . Hypercholesterolemia   . Aortic valve stenosis     Bicuspid  . Heart murmur   . SVT (supraventricular tachycardia)     Current Outpatient Prescriptions  Medication Sig Dispense Refill  . atorvastatin (LIPITOR) 40 MG tablet Take 40 mg by mouth daily.      . hydrochlorothiazide (MICROZIDE) 12.5 MG capsule Take 12.5 mg by mouth daily.        [name] lisinopril (PRINIVIL,ZESTRIL) 20 MG tablet Take 1 tablet (20 mg total) by mouth daily.  30 tablet  11  . Lycopene 10 MG CAPS Take 1 capsule by mouth.      . Multiple Vitamin (MULTIVITAMIN) tablet Take 1 tablet by mouth daily.        . Omega-3 Fatty Acids (FISH OIL) 1000 MG CAPS Take 1 capsule by mouth daily.      . vitamin C (ASCORBIC ACID) 500 MG tablet Take 1,000 mg by mouth daily.         Allergies: Allergies  Allergen Reactions  . Tomato Hives and Itching    Only allergic to raw tomato    History  Substance Use Topics  . Smoking status: Former Smoker    Types: Cigarettes    Quit date: 03/08/2011  . Smokeless tobacco: Never Used  . Alcohol Use: Yes     RED WINE EACH EVENING,  LIQUOR OCCASIONAL     PHYSICAL EXAM: VS:  BP 136/82  Pulse 53  Ht 6\' 1"  (1.854 m)  Wt 275 lb 12.8 oz (125.102 kg)  BMI 36.39 kg/m2 Well nourished, well developed, in no acute distress HEENT: normal Neck: no JVD Cardiac:  normal S1, S2; RRR; 2/6 systolic murmur RUSB Lungs:  clear to auscultation bilaterally, no wheezing, rhonchi or rales Abd: soft, nontender, no hepatomegaly Ext: no edema; right and left groin without hematoma or bruit  Skin: warm and dry Neuro:  CNs 2-12 intact, no focal abnormalities noted  EKG:  Sinus bradycardia, rate 53, normal axis, J-point elevation, no change from prior  ASSESSMENT AND PLAN:  1. SVT (supraventricular tachycardia)  Doing well post RFCA.  He can follow up with Dr. Sherryl Manges as needed.     2. Aortic valve stenosis  Continue follow up with Dr. Katrinka Blazing.     Signed, Tereso Newcomer, PA-C  10:15 AM 09/16/2011

## 2011-09-16 NOTE — Patient Instructions (Signed)
FOLLOW UP AS NEEDED

## 2013-05-12 ENCOUNTER — Other Ambulatory Visit (HOSPITAL_COMMUNITY): Payer: BC Managed Care – PPO

## 2013-06-03 ENCOUNTER — Telehealth: Payer: Self-pay

## 2013-06-06 MED ORDER — LISINOPRIL 20 MG PO TABS: 20.0000 mg | ORAL_TABLET | Freq: Every day | ORAL | Status: DC

## 2013-06-06 NOTE — Telephone Encounter (Signed)
Done

## 2013-11-28 ENCOUNTER — Other Ambulatory Visit: Payer: Self-pay | Admitting: *Deleted

## 2013-11-28 DIAGNOSIS — E785 Hyperlipidemia, unspecified: Secondary | ICD-10-CM

## 2013-12-21 ENCOUNTER — Other Ambulatory Visit (INDEPENDENT_AMBULATORY_CARE_PROVIDER_SITE_OTHER): Payer: BC Managed Care – PPO

## 2013-12-21 DIAGNOSIS — E785 Hyperlipidemia, unspecified: Secondary | ICD-10-CM

## 2013-12-21 LAB — HEPATIC FUNCTION PANEL
ALT: 36 U/L (ref 0–53)
AST: 25 U/L (ref 0–37)
Albumin: 4.8 g/dL (ref 3.5–5.2)
Alkaline Phosphatase: 89 U/L (ref 39–117)
Bilirubin, Direct: 0 mg/dL (ref 0.0–0.3)
Total Bilirubin: 0.7 mg/dL (ref 0.2–1.2)
Total Protein: 7.3 g/dL (ref 6.0–8.3)

## 2013-12-21 LAB — LIPID PANEL
Cholesterol: 143 mg/dL (ref 0–200)
HDL: 39.5 mg/dL (ref >39.00)
LDL Cholesterol: 74 mg/dL (ref 0–99)
NonHDL: 103.5
Total CHOL/HDL Ratio: 4
Triglycerides: 150 mg/dL — ABNORMAL HIGH (ref 0.0–149.0)
VLDL: 30 mg/dL (ref 0.0–40.0)

## 2013-12-23 ENCOUNTER — Telehealth: Payer: Self-pay

## 2013-12-23 DIAGNOSIS — E785 Hyperlipidemia, unspecified: Secondary | ICD-10-CM

## 2013-12-23 NOTE — Telephone Encounter (Signed)
lmom.lab are Excellent. Recheck 1 year.copy of labs mailed to pt.

## 2014-01-13 ENCOUNTER — Telehealth: Payer: Self-pay | Admitting: *Deleted

## 2014-01-13 MED ORDER — HYDROCHLOROTHIAZIDE 12.5 MG PO CAPS: 12.5000 mg | ORAL_CAPSULE | Freq: Every day | ORAL | Status: DC

## 2014-01-13 MED ORDER — ATORVASTATIN CALCIUM 40 MG PO TABS: 40.0000 mg | ORAL_TABLET | Freq: Every day | ORAL | Status: DC

## 2014-01-13 NOTE — Telephone Encounter (Signed)
Amy, can you address this in Lisa's absence today? Patient requests hctz and lipitor refill be sent to [facility] on [address]. Thanks, MI

## 2014-01-13 NOTE — Telephone Encounter (Signed)
Refilled

## 2014-03-30 ENCOUNTER — Ambulatory Visit: Payer: BC Managed Care – PPO | Admitting: Interventional Cardiology

## 2014-05-14 ENCOUNTER — Encounter: Payer: Self-pay | Admitting: *Deleted

## 2014-06-08 DIAGNOSIS — I10 Essential (primary) hypertension: Secondary | ICD-10-CM | POA: Insufficient documentation

## 2014-06-08 DIAGNOSIS — E785 Hyperlipidemia, unspecified: Secondary | ICD-10-CM | POA: Insufficient documentation

## 2014-06-08 NOTE — Progress Notes (Signed)
Patient ID: Dalton [name], male   DOB: 04/20/1966, 48 y.o.   MRN: [id number]    [address], [place], [place]  [address] Phone: [phone] Fax:  [phone]  Date:  06/08/2014   ID:  Dalton [name], DOB 06/07/1966, MRN [id number]  PCP:  Milagros Evener, MD   ASSESSMENT:  1. Bicuspid aortic valve with aortic stenosis 2. Prior history of SVT, resolved after radiofrequency ablation 3. Hypertension, essential 4. Hyperlipidemia  PLAN:  1. 2-D Doppler echocardiogram 2. Blood pressures at home over the next weeks and report back to [place] 3. Clinical follow-up in one year   SUBJECTIVE: Dalton [name] is a 48 y.o. male who is doing well. He is getting married. He has no syncope, dyspnea, or edema. He has not had palpitations. Overall he feels well.   Wt Readings from Last 3 Encounters:  09/16/11 275 lb 12.8 oz (125.102 kg)  08/22/11 275 lb 12.7 oz (125.1 kg)  08/08/11 271 lb (122.925 kg)     Past Medical History  Diagnosis Date  . Hypertension   . Hypercholesterolemia   . Aortic valve stenosis     Bicuspid  . Heart murmur   . SVT (supraventricular tachycardia)     Current Outpatient Prescriptions  Medication Sig Dispense Refill  . atorvastatin (LIPITOR) 40 MG tablet Take 1 tablet (40 mg total) by mouth daily. 30 tablet 2  . hydrochlorothiazide (MICROZIDE) 12.5 MG capsule Take 1 capsule (12.5 mg total) by mouth daily. 30 capsule 2  . lisinopril (PRINIVIL,ZESTRIL) 20 MG tablet Take 1 tablet (20 mg total) by mouth daily. 30 tablet 11  . Lycopene 10 MG CAPS Take 1 capsule by mouth.    . Multiple Vitamin (MULTIVITAMIN) tablet Take 1 tablet by mouth daily.      . Omega-3 Fatty Acids (FISH OIL) 1000 MG CAPS Take 1 capsule by mouth daily.    . vitamin C (ASCORBIC ACID) 500 MG tablet Take 1,000 mg by mouth daily.      No current facility-administered medications for this visit.    Allergies:    Allergies  Allergen Reactions  . Tomato Hives and Itching      Only allergic to raw tomato    Social History:  The patient  reports that he quit smoking about 3 years ago. His smoking use included Cigarettes. He smoked 0.00 packs per day. He has never used smokeless tobacco. He reports that he drinks alcohol. He reports that he uses illicit drugs.   ROS:  Please see the history of present illness.   No transient neurological complaints   All other systems reviewed and negative.   OBJECTIVE: VS:  There were no vitals taken for this visit. Well nourished, well developed, in no acute distress, obese HEENT: normal Neck: JVD flat. Carotid bruit bilateral transmitted from aortic valve  Cardiac:  normal S1, S2; RRR; 3 to 4/6 crescendo decrescendo systolic murmur at the right upper sternal border Lungs:  clear to auscultation bilaterally, no wheezing, rhonchi or rales Abd: soft, nontender, no hepatomegaly Ext: Edema absent. Pulses 2+ Skin: warm and dry Neuro:  CNs 2-12 intact, no focal abnormalities noted  EKG:  Normal sinus rhythm with prominent voltage       Signed, Illene Labrador III, MD 06/08/2014 7:30 PM

## 2014-06-09 ENCOUNTER — Encounter: Payer: Self-pay | Admitting: Interventional Cardiology

## 2014-06-09 ENCOUNTER — Ambulatory Visit (INDEPENDENT_AMBULATORY_CARE_PROVIDER_SITE_OTHER): Payer: BC Managed Care – PPO | Admitting: Interventional Cardiology

## 2014-06-09 VITALS — BP 150/72 | HR 55 | Ht 73.0 in | Wt 262.8 lb

## 2014-06-09 DIAGNOSIS — E785 Hyperlipidemia, unspecified: Secondary | ICD-10-CM

## 2014-06-09 DIAGNOSIS — I471 Supraventricular tachycardia: Secondary | ICD-10-CM

## 2014-06-09 DIAGNOSIS — I10 Essential (primary) hypertension: Secondary | ICD-10-CM

## 2014-06-09 DIAGNOSIS — I350 Nonrheumatic aortic (valve) stenosis: Secondary | ICD-10-CM

## 2014-06-09 MED ORDER — HYDROCHLOROTHIAZIDE 12.5 MG PO CAPS: 12.5000 mg | ORAL_CAPSULE | Freq: Every day | ORAL | Status: DC

## 2014-06-09 MED ORDER — LISINOPRIL 20 MG PO TABS: 20.0000 mg | ORAL_TABLET | Freq: Every day | ORAL | Status: DC

## 2014-06-09 MED ORDER — ATORVASTATIN CALCIUM 40 MG PO TABS: 40.0000 mg | ORAL_TABLET | Freq: Every day | ORAL | Status: DC

## 2014-06-09 NOTE — Patient Instructions (Addendum)
Your physician recommends that you continue on your current medications as directed. Please refer to the Current Medication list given to you today.  Your physician has requested that you have an echocardiogram. Echocardiography is a painless test that uses sound waves to create images of your heart. It provides your doctor with information about the size and shape of your heart and how well your heart's chambers and valves are working. This procedure takes approximately one hour. There are no restrictions for this procedure.  Your physician wants you to follow-up in: 1 year with Dr. Smith.  You will receive a reminder letter in the mail two months in advance. If you don't receive a letter, please call our office to schedule the follow-up appointment.  

## 2014-06-13 ENCOUNTER — Encounter: Payer: Self-pay | Admitting: Interventional Cardiology

## 2014-06-15 ENCOUNTER — Telehealth: Payer: Self-pay

## 2014-06-15 ENCOUNTER — Encounter (HOSPITAL_COMMUNITY): Payer: Self-pay | Admitting: Internal Medicine

## 2014-06-15 NOTE — Telephone Encounter (Signed)
pt wife aware d.o.t letter will be left at the front desk for pick up

## 2014-06-19 ENCOUNTER — Other Ambulatory Visit (HOSPITAL_COMMUNITY): Payer: BC Managed Care – PPO | Admitting: Cardiology

## 2014-06-19 ENCOUNTER — Ambulatory Visit (HOSPITAL_COMMUNITY): Payer: BC Managed Care – PPO | Attending: Cardiology | Admitting: Radiology

## 2014-06-19 ENCOUNTER — Other Ambulatory Visit: Payer: Self-pay

## 2014-06-19 DIAGNOSIS — I471 Supraventricular tachycardia: Secondary | ICD-10-CM | POA: Insufficient documentation

## 2014-06-19 DIAGNOSIS — E669 Obesity, unspecified: Secondary | ICD-10-CM | POA: Insufficient documentation

## 2014-06-19 DIAGNOSIS — I350 Nonrheumatic aortic (valve) stenosis: Secondary | ICD-10-CM | POA: Insufficient documentation

## 2014-06-19 DIAGNOSIS — Q231 Congenital insufficiency of aortic valve: Secondary | ICD-10-CM | POA: Insufficient documentation

## 2014-06-19 DIAGNOSIS — E785 Hyperlipidemia, unspecified: Secondary | ICD-10-CM | POA: Diagnosis not present

## 2014-06-19 DIAGNOSIS — I10 Essential (primary) hypertension: Secondary | ICD-10-CM | POA: Insufficient documentation

## 2014-06-19 DIAGNOSIS — R011 Cardiac murmur, unspecified: Secondary | ICD-10-CM | POA: Insufficient documentation

## 2014-06-19 DIAGNOSIS — I359 Nonrheumatic aortic valve disorder, unspecified: Secondary | ICD-10-CM | POA: Diagnosis present

## 2014-06-19 MED ORDER — PERFLUTREN LIPID MICROSPHERE
3.0000 mL | Freq: Once | INTRAVENOUS | Status: AC
  Administered 2014-06-19: 3 mL via INTRAVENOUS

## 2014-06-19 NOTE — Progress Notes (Signed)
Echocardiogram performed with [facility].  

## 2014-06-22 ENCOUNTER — Telehealth: Payer: Self-pay

## 2014-06-22 NOTE — Telephone Encounter (Signed)
Follow Up    Pt returning call regarding ECHO results. Please call back.

## 2014-06-22 NOTE — Telephone Encounter (Signed)
-----   Message from Burna, MD sent at 06/21/2014  7:39 PM EST ----- Severely calcified valve with mild aortic stenosis. Mildly dilated aortic root. Normal heart function and moderate increase in wall thickness. Will need a repeat echocardiogram in 2 years.

## 2014-06-22 NOTE — Telephone Encounter (Signed)
called to give pt echo resultd.lmtcb

## 2014-07-01 DIAGNOSIS — I359 Nonrheumatic aortic valve disorder, unspecified: Secondary | ICD-10-CM | POA: Insufficient documentation

## 2014-07-01 DIAGNOSIS — I498 Other specified cardiac arrhythmias: Secondary | ICD-10-CM | POA: Insufficient documentation

## 2015-03-14 ENCOUNTER — Encounter: Payer: Self-pay | Admitting: *Deleted

## 2015-03-21 NOTE — Discharge Instructions (Signed)
[facility] °[facility] °ENDOSCOPIC SINUS SURGERY °[facility] ° °What is Functional Endoscopic Sinus Surgery? ° The Surgery involves making the natural openings of the sinuses larger by removing the bony partitions that separate the sinuses from the nasal cavity.  The natural sinus lining is preserved as much as possible to allow the sinuses to resume normal function after the surgery.  In some patients nasal polyps (excessively swollen lining of the sinuses) may be removed to relieve obstruction of the sinus openings.  The surgery is performed through the nose using lighted scopes, which eliminates the need for incisions on the face.  A septoplasty is a different procedure which is sometimes performed with sinus surgery.  It involves straightening the boy partition that separates the two sides of your nose.  A crooked or deviated septum may need repair if is obstructing the sinuses or nasal airflow.  Turbinate reduction is also often performed during sinus surgery.  The turbinates are bony proturberances from the side walls of the nose which swell and can obstruct the nose in patients with sinus and allergy problems.  Their size can be surgically reduced to help relieve nasal obstruction. ° °What Can Sinus Surgery Do For Me? ° Sinus surgery can reduce the frequency of sinus infections requiring antibiotic treatment.  This can provide improvement in nasal congestion, post-nasal drainage, facial pressure and nasal obstruction.  Surgery will NOT prevent you from ever having an infection again, so it usually only for patients who get infections 4 or more times yearly requiring antibiotics, or for infections that do not clear with antibiotics.  It will not cure nasal allergies, so patients with allergies may still require medication to treat their allergies after surgery. Surgery may improve headaches related to sinusitis, however, some people will continue to  require medication to control sinus headaches related to allergies.  Surgery will do nothing for other forms of headache (migraine, tension or cluster). ° °What Are the Risks of Endoscopic Sinus Surgery? ° Current techniques allow surgery to be performed safely with little risk, however, there are rare complications that patients should be aware of.  Because the sinuses are located around the eyes, there is risk of eye injury, including blindness, though again, this would be quite rare. This is usually a result of bleeding behind the eye during surgery, which puts the vision oat risk, though there are treatments to protect the vision and prevent permanent disrupted by surgery causing a leak of the spinal fluid that surrounds the brain.  More serious complications would include bleeding inside the brain cavity or damage to the brain.  Again, all of these complications are uncommon, and spinal fluid leaks can be safely managed surgically if they occur.  The most common complication of sinus surgery is bleeding from the nose, which may require packing or cauterization of the nose.  Continued sinus have polyps may experience recurrence of the polyps requiring revision surgery.  Alterations of sense of smell or injury to the tear ducts are also rare complications.  ° °What is the Surgery Like, and what is the Recovery? ° The Surgery usually takes a couple of hours to perform, and is usually performed under a general anesthetic (completely asleep).  Patients are usually discharged home after a couple of hours.  Sometimes during surgery it is necessary to pack the nose to control bleeding, and the packing is left in place for 24 - 48 hours, and removed by your surgeon.    If a septoplasty was performed during the procedure, there is often a splint placed which must be removed after 5-7 days.   °Discomfort: Pain is usually mild to moderate, and can be controlled by prescription pain medication or acetaminophen (Tylenol).   Aspirin, Ibuprofen (Advil, Motrin), or Naprosyn (Aleve) should be avoided, as they can cause increased bleeding.  Most patients feel sinus pressure like they have a bad head cold for several days.  Sleeping with your head elevated can help reduce swelling and facial pressure, as can ice packs over the face.  A humidifier may be helpful to keep the mucous and blood from drying in the nose.  ° °Diet: There are no specific diet restrictions, however, you should generally start with clear liquids and a light diet of bland foods because the anesthetic can cause some nausea.  Advance your diet depending on how your stomach feels.  Taking your pain medication with food will often help reduce stomach upset which pain medications can cause. ° °Nasal Saline Irrigation: It is important to remove blood clots and dried mucous from the nose as it is healing.  This is done by having you irrigate the nose at least 3 - 4 times daily with a salt water solution.  We recommend using NeilMed Sinus Rinse (available at the drug store).  Fill the squeeze bottle with the solution, bend over a sink, and insert the tip of the squeeze bottle into the nose ½ of an inch.  Point the tip of the squeeze bottle towards the inside corner of the eye on the same side your irrigating.  Squeeze the bottle and gently irrigate the nose.  If you bend forward as you do this, most of the fluid will flow back out of the nose, instead of down your throat.   The solution should be warm, near body temperature, when you irrigate.   Each time you irrigate, you should use a full squeeze bottle.  ° °Note that if you are instructed to use Nasal Steroid Sprays at any time after your surgery, irrigate with saline BEFORE using the steroid spray, so you do not wash it all out of the nose. °Another product, Nasal Saline Gel (such as AYR Nasal Saline Gel) can be applied in each nostril 3 - 4 times daily to moisture the nose and reduce scabbing or crusting. ° °Bleeding:   Bloody drainage from the nose can be expected for several days, and patients are instructed to irrigate their nose frequently with salt water to help remove mucous and blood clots.  The drainage may be dark red or brown, though some fresh blood may be seen intermittently, especially after irrigation.  Do not blow you nose, as bleeding may occur. If you must sneeze, keep your mouth open to allow air to escape through your mouth. ° °If heavy bleeding occurs: Irrigate the nose with saline to rinse out clots, then spray the nose 3 - 4 times with Afrin Nasal Decongestant Spray.  The spray will constrict the blood vessels to slow bleeding.  Pinch the lower half of your nose shut to apply pressure, and lay down with your head elevated.  Ice packs over the nose may help as well. If bleeding persists despite these measures, you should notify your doctor.  Do not use the Afrin routinely to control nasal congestion after surgery, as it can result in worsening congestion and may affect healing.  ° ° ° °Activity: Return to work varies among patients. Most patients will be   out of work at least 5 - 7 days to recover.  Patient may return to work after they are off of narcotic pain medication, and feeling well enough to perform the functions of their job.  Patients must avoid heavy lifting (over 10 pounds) or strenuous physical for 2 weeks after surgery, so your employer may need to assign you to light duty, or keep you out of work longer if light duty is not possible.  NOTE: you should not drive, operate dangerous machinery, do any mentally demanding tasks or make any important legal or financial decisions while on narcotic pain medication and recovering from the general anesthetic.  °  °Call Your Doctor Immediately if You Have Any of the Following: °1. Bleeding that you cannot control with the above measures °2. Loss of vision, double vision, bulging of the eye or black eyes. °3. Fever over 101 degrees °4. Neck stiffness with  severe headache, fever, nausea and change in mental state. °You are always encourage to call anytime with concerns, however, please call with requests for pain medication refills during office hours. ° °Office Endoscopy: During follow-up visits your doctor will remove any packing or splints that may have been placed and evaluate and clean your sinuses endoscopically.  Topical anesthetic will be used to make this as comfortable as possible, though you may want to take your pain medication prior to the visit.  How often this will need to be done varies from patient to patient.  After complete recovery from the surgery, you may need follow-up endoscopy from time to time, particularly if there is concern of recurrent infection or nasal polyps. ° °General Anesthesia, Care After °Refer to this sheet in the next few weeks. These instructions provide you with information on caring for yourself after your procedure. Your health care provider may also give you more specific instructions. Your treatment has been planned according to current medical practices, but problems sometimes occur. Call your health care provider if you have any problems or questions after your procedure. °WHAT TO EXPECT AFTER THE PROCEDURE °After the procedure, it is typical to experience: °· Sleepiness. °· Nausea and vomiting. °HOME CARE INSTRUCTIONS °· For the first 24 hours after general anesthesia: °¨ Have a responsible person with you. °¨ Do not drive a car. If you are alone, do not take public transportation. °¨ Do not drink alcohol. °¨ Do not take medicine that has not been prescribed by your health care provider. °¨ Do not sign important papers or make important decisions. °¨ You may resume a normal diet and activities as directed by your health care provider. °· Change bandages (dressings) as directed. °· If you have questions or problems that seem related to general anesthesia, call the hospital and ask for the anesthetist or anesthesiologist  on call. °SEEK MEDICAL CARE IF: °· You have nausea and vomiting that continue the day after anesthesia. °· You develop a rash. °SEEK IMMEDIATE MEDICAL CARE IF:  °· You have difficulty breathing. °· You have chest pain. °· You have any allergic problems. °Document Released: 09/29/2000 Document Revised: 06/28/2013 Document Reviewed: 01/06/2013 °[employer]® Patient Information ©2015 [employer]. This information is not intended to replace advice given to you by your health care provider. Make sure you discuss any questions you have with your health care provider. ° °

## 2015-03-22 ENCOUNTER — Ambulatory Visit: Payer: BLUE CROSS/BLUE SHIELD | Admitting: Anesthesiology

## 2015-03-22 ENCOUNTER — Encounter
Admission: RE | Disposition: A | Discharge status: Home / Self Care | Payer: Self-pay | Source: Ambulatory Visit | Attending: Otolaryngology

## 2015-03-22 ENCOUNTER — Encounter: Payer: Self-pay | Admitting: Otolaryngology

## 2015-03-22 ENCOUNTER — Ambulatory Visit
Admission: RE | Admit: 2015-03-22 | Discharge: 2015-03-22 | Disposition: A | Discharge status: Home / Self Care | Payer: BLUE CROSS/BLUE SHIELD | Source: Ambulatory Visit | Attending: Otolaryngology | Admitting: Otolaryngology

## 2015-03-22 DIAGNOSIS — I10 Essential (primary) hypertension: Secondary | ICD-10-CM | POA: Insufficient documentation

## 2015-03-22 DIAGNOSIS — Z8249 Family history of ischemic heart disease and other diseases of the circulatory system: Secondary | ICD-10-CM | POA: Diagnosis not present

## 2015-03-22 DIAGNOSIS — I350 Nonrheumatic aortic (valve) stenosis: Secondary | ICD-10-CM | POA: Diagnosis not present

## 2015-03-22 DIAGNOSIS — Z8261 Family history of arthritis: Secondary | ICD-10-CM | POA: Diagnosis not present

## 2015-03-22 DIAGNOSIS — E785 Hyperlipidemia, unspecified: Secondary | ICD-10-CM | POA: Diagnosis not present

## 2015-03-22 DIAGNOSIS — J342 Deviated nasal septum: Secondary | ICD-10-CM | POA: Insufficient documentation

## 2015-03-22 DIAGNOSIS — Z809 Family history of malignant neoplasm, unspecified: Secondary | ICD-10-CM | POA: Diagnosis not present

## 2015-03-22 DIAGNOSIS — Z79899 Other long term (current) drug therapy: Secondary | ICD-10-CM | POA: Insufficient documentation

## 2015-03-22 DIAGNOSIS — Z87891 Personal history of nicotine dependence: Secondary | ICD-10-CM | POA: Insufficient documentation

## 2015-03-22 DIAGNOSIS — J343 Hypertrophy of nasal turbinates: Secondary | ICD-10-CM | POA: Insufficient documentation

## 2015-03-22 DIAGNOSIS — I471 Supraventricular tachycardia: Secondary | ICD-10-CM | POA: Insufficient documentation

## 2015-03-22 HISTORY — PX: SEPTOPLASTY: SHX2393

## 2015-03-22 HISTORY — DX: Cardiac arrhythmia, unspecified: I49.9

## 2015-03-22 SURGERY — SEPTOPLASTY, NOSE
Anesthesia: General | Wound class: Clean Contaminated

## 2015-03-22 MED ORDER — CEFAZOLIN SODIUM-DEXTROSE 2-3 GM-% IV SOLR
2.0000 g | Freq: Once | INTRAVENOUS | Status: AC
  Administered 2015-03-22: 2 g via INTRAVENOUS

## 2015-03-22 MED ORDER — OXYCODONE HCL 5 MG PO TABS: 5.0000 mg | ORAL_TABLET | Freq: Once | ORAL | Status: DC | PRN

## 2015-03-22 MED ORDER — OXYMETAZOLINE HCL 0.05 % NA SOLN
2.0000 | Freq: Once | NASAL | Status: AC
  Administered 2015-03-22: 2 via NASAL

## 2015-03-22 MED ORDER — DEXAMETHASONE SODIUM PHOSPHATE 4 MG/ML IJ SOLN
INTRAMUSCULAR | Status: DC | PRN
  Administered 2015-03-22: 10 mg via INTRAVENOUS

## 2015-03-22 MED ORDER — SUCCINYLCHOLINE CHLORIDE 20 MG/ML IJ SOLN
INTRAMUSCULAR | Status: DC | PRN
  Administered 2015-03-22: 100 mg via INTRAVENOUS

## 2015-03-22 MED ORDER — HYDROMORPHONE HCL 1 MG/ML IJ SOLN: 0.2500 mg | INTRAMUSCULAR | Status: DC | PRN

## 2015-03-22 MED ORDER — FENTANYL CITRATE (PF) 100 MCG/2ML IJ SOLN
INTRAMUSCULAR | Status: DC | PRN
  Administered 2015-03-22: 100 ug via INTRAVENOUS

## 2015-03-22 MED ORDER — ONDANSETRON HCL 4 MG/2ML IJ SOLN
INTRAMUSCULAR | Status: DC | PRN
  Administered 2015-03-22: 4 mg via INTRAVENOUS

## 2015-03-22 MED ORDER — MIDAZOLAM HCL 5 MG/5ML IJ SOLN
INTRAMUSCULAR | Status: DC | PRN
  Administered 2015-03-22: 2 mg via INTRAVENOUS

## 2015-03-22 MED ORDER — PROPOFOL 10 MG/ML IV BOLUS
INTRAVENOUS | Status: DC | PRN
  Administered 2015-03-22: 200 mg via INTRAVENOUS

## 2015-03-22 MED ORDER — ONDANSETRON HCL 4 MG/2ML IJ SOLN
4.0000 mg | Freq: Once | INTRAMUSCULAR | Status: AC | PRN
  Administered 2015-03-22: 4 mg via INTRAVENOUS

## 2015-03-22 MED ORDER — LIDOCAINE HCL (CARDIAC) 20 MG/ML IV SOLN
INTRAVENOUS | Status: DC | PRN
  Administered 2015-03-22: 40 mg via INTRAVENOUS

## 2015-03-22 MED ORDER — OXYCODONE HCL 5 MG/5ML PO SOLN: 5.0000 mg | Freq: Once | ORAL | Status: DC | PRN

## 2015-03-22 MED ORDER — LIDOCAINE HCL 1 % IJ SOLN
INTRAMUSCULAR | Status: DC | PRN
  Administered 2015-03-22: 30 mL

## 2015-03-22 MED ORDER — LACTATED RINGERS IV SOLN
INTRAVENOUS | Status: DC
  Administered 2015-03-22: 09:00:00 via INTRAVENOUS

## 2015-03-22 MED ORDER — LIDOCAINE-EPINEPHRINE 1 %-1:100000 IJ SOLN
INTRAMUSCULAR | Status: DC | PRN
  Administered 2015-03-22: 4 mL

## 2015-03-22 SURGICAL SUPPLY — 44 items
BLADE SURG 15 STRL LF DISP TIS (BLADE) IMPLANT
BLADE SURG 15 STRL SS (BLADE)
CANISTER SUCT 1200ML W/VALVE (MISCELLANEOUS) ×3 IMPLANT
CATH IV 18X1 1/4 SAFELET (CATHETERS) ×3 IMPLANT
COAG SUCT 10F 3.5MM HAND CTRL (MISCELLANEOUS) ×3 IMPLANT
COAGULATOR SUCT 8FR VV (MISCELLANEOUS) IMPLANT
DEVICE INFLATION 20/61 (MISCELLANEOUS) IMPLANT
DRAPE HEAD BAR (DRAPES) ×3 IMPLANT
DRESSING NASL FOAM PST OP SINU (MISCELLANEOUS) IMPLANT
DRSG NASAL 4CM NASOPORE (MISCELLANEOUS) IMPLANT
DRSG NASAL FOAM POST OP SINU (MISCELLANEOUS)
GLOVE PI ULTRA LF STRL 7.5 (GLOVE) ×4 IMPLANT
GLOVE PI ULTRA NON LATEX 7.5 (GLOVE) ×2
IRRIGATOR 4MM STR (IRRIGATION / IRRIGATOR) ×3 IMPLANT
IV CATH 18X1 1/4 SAFELET (CATHETERS) ×2
IV NS 500ML (IV SOLUTION)
IV NS 500ML BAXH (IV SOLUTION) ×1 IMPLANT
NDL HYPO 25GX1X1/2 BEV (NEEDLE) ×1 IMPLANT
NDL SPNL 25GX3.5 QUINCKE BL (NEEDLE) IMPLANT
NEEDLE HYPO 25GX1X1/2 BEV (NEEDLE) ×3 IMPLANT
NEEDLE SPNL 25GX3.5 QUINCKE BL (NEEDLE) ×3 IMPLANT
NS IRRIG 500ML POUR BTL (IV SOLUTION) ×3 IMPLANT
PACK DRAPE NASAL/ENT (PACKS) ×3 IMPLANT
PACKING NASAL EPIS 4X2.4 XEROG (MISCELLANEOUS) IMPLANT
PAD GROUND ADULT SPLIT (MISCELLANEOUS) ×3 IMPLANT
PATTIES SURGICAL .5 X3 (DISPOSABLE) ×3 IMPLANT
SET HANDPIECE IRR DIEGO (MISCELLANEOUS) ×3 IMPLANT
SINUPLASTY BALLN CATHTIP (CATHETERS) IMPLANT
SOL ANTI-FOG 6CC FOG-OUT (MISCELLANEOUS) ×2 IMPLANT
SOL FOG-OUT ANTI-FOG 6CC (MISCELLANEOUS) ×1
SPLINT NASAL SEPTAL BLV .50 ST (MISCELLANEOUS) ×1 IMPLANT
STRAP BODY AND KNEE 60X3 (MISCELLANEOUS) ×5 IMPLANT
SUT CHROMIC 3-0 (SUTURE)
SUT CHROMIC 3-0 KS 27XMFL CR (SUTURE)
SUT ETHILON 3-0 KS 30 BLK (SUTURE) ×3 IMPLANT
SUT ETHILON 4-0 (SUTURE)
SUT ETHILON 4-0 FS2 18XMFL BLK (SUTURE)
SUT PLAIN GUT 4-0 (SUTURE) ×3 IMPLANT
SUTURE CHRMC 3-0 KS 27XMFL CR (SUTURE) ×1 IMPLANT
SUTURE ETHLN 4-0 FS2 18XMF BLK (SUTURE) IMPLANT
SYR 3ML LL SCALE MARK (SYRINGE) ×3 IMPLANT
SYSTEM BALLN SINUPLASTY 6X16 (BALLOONS) IMPLANT
TOWEL OR 17X26 4PK STRL BLUE (TOWEL DISPOSABLE) ×3 IMPLANT
WATER STERILE IRR 500ML POUR (IV SOLUTION) IMPLANT

## 2015-03-22 NOTE — Anesthesia Preprocedure Evaluation (Signed)
Anesthesia Evaluation  Patient identified by MRN, date of birth, ID band Patient awake    Reviewed: Allergy & Precautions, NPO status , Patient's Chart, lab work & pertinent test results  Airway Mallampati: II  TM Distance: >3 FB Neck ROM: Full    Dental   Pulmonary former smoker,    Pulmonary exam normal        Cardiovascular hypertension, Normal cardiovascular exam+ dysrhythmias + Valvular Problems/Murmurs AS      Neuro/Psych    GI/Hepatic   Endo/Other    Renal/GU      Musculoskeletal   Abdominal   Peds  Hematology   Anesthesia Other Findings   Reproductive/Obstetrics                             Anesthesia Physical Anesthesia Plan  ASA: II  Anesthesia Plan: General   Post-op Pain Management:    Induction: Intravenous  Airway Management Planned: Oral ETT  Additional Equipment:   Intra-op Plan:   Post-operative Plan: Extubation in OR  Informed Consent: I have reviewed the patients History and Physical, chart, labs and discussed the procedure including the risks, benefits and alternatives for the proposed anesthesia with the patient or authorized representative who has indicated his/her understanding and acceptance.     Plan Discussed with: CRNA  Anesthesia Plan Comments:         Anesthesia Quick Evaluation

## 2015-03-22 NOTE — Transfer of Care (Signed)
Immediate Anesthesia Transfer of Care Note  Patient: Dalton [name]  Procedure(s) Performed: Procedure(s): SEPTOPLASTY  WITH RIGHT INFERIOR TURBINATE REDUCTION (N/A)  Patient Location: PACU  Anesthesia Type: General  Level of Consciousness: awake, alert  and patient cooperative  Airway and Oxygen Therapy: Patient Spontanous Breathing and Patient connected to supplemental oxygen  Post-op Assessment: Post-op Vital signs reviewed, Patient's Cardiovascular Status Stable, Respiratory Function Stable, Patent Airway and No signs of Nausea or vomiting  Post-op Vital Signs: Reviewed and stable  Complications: No apparent anesthesia complications

## 2015-03-22 NOTE — Anesthesia Postprocedure Evaluation (Signed)
  Anesthesia Post-op Note  Patient: Dalton [name]  Procedure(s) Performed: Procedure(s): SEPTOPLASTY  WITH RIGHT INFERIOR TURBINATE REDUCTION (N/A)  Anesthesia type:General  Patient location: PACU  Post pain: Pain level controlled  Post assessment: Post-op Vital signs reviewed, Patient's Cardiovascular Status Stable, Respiratory Function Stable, Patent Airway and No signs of Nausea or vomiting  Post vital signs: Reviewed and stable  Last Vitals:  Filed Vitals:   03/22/15 1045  BP: 128/82  Pulse: 66  Temp:   Resp: 17    Level of consciousness: awake, alert  and patient cooperative  Complications: No apparent anesthesia complications

## 2015-03-22 NOTE — Anesthesia Procedure Notes (Signed)
Procedure Name: Intubation Date/Time: 03/22/2015 9:16 AM Performed by: Londell Moh Pre-anesthesia Checklist: Patient identified, Emergency Drugs available, Suction available, Patient being monitored and Timeout performed Patient Re-evaluated:Patient Re-evaluated prior to inductionOxygen Delivery Method: Circle system utilized Preoxygenation: Pre-oxygenation with 100% oxygen Intubation Type: IV induction Ventilation: Mask ventilation without difficulty Grade View: Grade I Tube type: Oral Rae Tube size: 7.5 mm Number of attempts: 1 Placement Confirmation: ETT inserted through vocal cords under direct vision,  positive ETCO2 and breath sounds checked- equal and bilateral Tube secured with: Tape Dental Injury: Teeth and Oropharynx as per pre-operative assessment

## 2015-03-22 NOTE — H&P (Signed)
  H&P has been reviewed and no changes necessary. To be downloaded later. 

## 2015-03-22 NOTE — Op Note (Signed)
03/22/2015  10:19 AM  [id number]   Pre-Op Dx:  Deviated Nasal Septum, Hypertrophic Inferior Turbinates  Post-op Dx: Same  Proc: Nasal Septoplasty,  Partial Reduction Right Inferior Turbinate   Surg:  [name]  Anes:  GOT  EBL:  150 mL  Comp:  None  Findings: Shaley deviated septum to the left side pushing into the lateral nasal wall and turbinate a large right inferior turbinate was overgrown  Procedure: With the patient in a comfortable supine position,  general orotracheal anesthesia was induced without difficulty.     The patient received preoperative Afrin spray for topical decongestion and vasoconstriction.  Intravenous prophylactic antibiotics were administered.  At an appropriate level, the patient was placed in a semi-sitting position.  Nasal vibrissae were trimmed.   1% Xylocaine with 1:100,000 epinephrine, 10 cc's, was infiltrated into the anterior floor of the nose, into the nasal spine region, into the membranous columella, and finally into the submucoperichondrial plane of the septum on both sides.  Several minutes were allowed for this to take effect.  Cottoniod pledgetts soaked in Afrin and 4% Xylocaine were placed into both nasal cavities and left while the patient was prepped and draped in the standard fashion.  The materials were removed from the nose and observed to be intact and correct in number.  The nose was inspected with a headlight and also with a 0 degree scope, with the findings as described above.  A left Killian incision was sharply executed and carried down to the caudal edge of the quadrangular cartilage. The mucoperichondrium was elelvated along the quadrangular plate back to the bony-cartilaginous junction. The mucoperiostium was then elevated along the ethmoid plate and the vomer. The boney-catilaginous junction was then split with a freer elevator and the mucoperiosteum was elevated on the opposite side. The mucoperiosteum was then elevated along  the maxillary crest as needed to expose the crooked bone of the crest.  Boney spurs of the vomer and maxillary crest were removed with Donavan Foil forceps.  The cartilaginous plate was trimmed along its posterior and inferior borders of about 2 mm of cartilage to free it up inferiorly. Some of the deviated ethmoid plate was then fractured and removed with Takahashi forceps to free up the posterior border of the quadrangular plate and allow it to swing back to the midline. The mucosal flaps were placed back into their anatomic position to allow visualization of the airways. The septum now sat in the midline with an improved airway.  The mucosal flaps are then sutured together using a through and through whip stitch of 4-0 Plain Gut with a mini-Keith needle. This was used to close the Heeney incision as well.   The inferior turbinates were then inspected. An incision was created along the inferior aspect of the right inferior turbinate with removal of some of the inferior soft tissue and bone. Electrocautery was used to control bleeding in the area. The remaining turbinate was then outfractured to open up the airway further. There was no significant bleeding noted.  The airways were then visualized and showed open passageways on both sides that were significantly improved compared to before surgery. There was no signifcant bleeding. Nasal splints were applied to both sides of the septum using Xomed 0.46mm regular sized splints that were trimmed, and then held in position with a 3-0 Nylon through and through suture.  The patient was turned back over to anesthesia, and awakened, extubated, and taken to the PACU in satisfactory condition.  Dispo:  PACU to home  Plan: Ice, elevation, narcotic analgesia, steroid taper, and prophylactic antibiotics for the duration of indwelling nasal foreign bodies.  We will reevaluate the patient in the office in 6 days and remove the septal splints.  Return to work in 10  days, strenuous activities in two weeks.   Dalton [name] H 03/22/2015 10:19 AM

## 2015-03-23 ENCOUNTER — Encounter: Payer: Self-pay | Admitting: Otolaryngology

## 2015-04-25 ENCOUNTER — Encounter: Payer: Self-pay | Admitting: Physician Assistant

## 2015-04-25 NOTE — Progress Notes (Signed)
Cardiology Office Note Date:  04/26/2015  Patient ID:  Dalton, [name] [birth year], MRN [id number] PCP:  Einar Pheasant, MD  Cardiologist:  Dr. Tamala Julian  Chief Complaint: f/u aortic valve disorder  History of Present Illness: Dalton [name] is a 49 y.o. male with history of biscupid aortic valve with AS, prior SVT s/p RF ablation, HTN, HLD who presents for follow-up. Last echo 06/2014: mod LVH, EF 60-65%, grade 1 DD, severely calcified AV leaflets (bicuspid by prior report, poorly visualized on this study), moderate aortic stenosis with mild AI, mildly dilated ascending aorta - repeat planned in 2 years. There have been no significant interim medical events other than that he is being treated for seasonal allergies with allergy shots. He's snores but he thinks this may be related to the allergies. He is in between primary care providers at this time.  He is doing well from cardiovascular standpoint. Denies CP, SOB, lightheadedness, bleeding, near-syncope, or syncope. He does not think he's had labs checked in a while. 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   Past Medical History  Diagnosis Date  . Hypertension   . Hypercholesterolemia   . Aortic valve stenosis     a. Bicuspid AV - 2D Echo 06/2014 - mod AS, mild AI, mildly dilated aortic root.  . SVT (supraventricular tachycardia) ([place])     a. h/o SVT - Ablation done 2013.  . Dilated aortic root ([place])     a. By echo 06/2014.    Past Surgical History  Procedure Laterality Date  . Tonsillectomy    . Surgery for non descending testicle    . Cardiac electrophysiology study and ablation  08/21/2011  . Supraventricular tachycardia ablation N/A 08/21/2011    Procedure: SUPRAVENTRICULAR TACHYCARDIA ABLATION;  Surgeon: Evans Lance, MD;  Location: [facility] CATH LAB;  Service: Cardiovascular;  Laterality: N/A;    . Septoplasty N/A 03/22/2015    Procedure: SEPTOPLASTY  WITH RIGHT INFERIOR TURBINATE REDUCTION;  Surgeon: Margaretha Sheffield, MD;  Location: [place];  Service: ENT;  Laterality: N/A;    Current Outpatient Prescriptions  Medication Sig Dispense Refill  . atorvastatin (LIPITOR) 40 MG tablet Take 1 tablet (40 mg total) by mouth daily. 30 tablet 11  . hydrochlorothiazide (MICROZIDE) 12.5 MG capsule Take 1 capsule (12.5 mg total) by mouth daily. 30 capsule 11  . lisinopril (PRINIVIL,ZESTRIL) 20 MG tablet Take 1 tablet (20 mg total) by mouth daily. 30 tablet 11  . mometasone (NASONEX) 50 MCG/ACT nasal spray Place 2 sprays into the nose daily.    . tamsulosin (FLOMAX) 0.4 MG CAPS capsule Take 0.4 mg by mouth daily after supper.    . testosterone cypionate (DEPOTESTOSTERONE CYPIONATE) 200 MG/ML injection Inject 200 mg into the muscle every 14 (fourteen) days. Mondays    . vitamin C (ASCORBIC ACID) 500 MG tablet Take 1,000 mg by mouth daily.      No current facility-administered medications for this visit.    Allergies:   Tomato   Social History:  The patient  reports that he quit smoking about 26 years ago. His smoking use included Cigarettes. He has never used smokeless tobacco. He reports that he drinks about 4.8 oz of alcohol per week. He reports that he uses illicit drugs.   Family History:  The patient's family history includes Aortic stenosis in his maternal grandfather; Breast cancer  in his maternal grandmother; Cancer in his maternal grandfather and another family member; Diabetes in an other family member.  ROS:  Please see the history of present illness. All other systems are reviewed and otherwise negative.   PHYSICAL EXAM:  VS:  BP 130/88 mmHg  Pulse 60  Ht 6\' 1"  (1.854 m)  Wt 273 lb 6.4 oz (124.013 kg)  BMI 36.08 kg/m2 BMI: Body mass index is 36.08 kg/(m^2). Well nourished, well developed large-built WM in no acute distress HEENT: normocephalic, atraumatic Neck: no JVD.  + bilateral carotid bruits, question radiation from aortic valve. Carotid upstrokes are brisk and equal. No masses Cardiac:  normal S1, S2; RRR. 2/6 SEM at RUSB. No murmurs, rubs, or gallops Lungs:  clear to auscultation bilaterally, no wheezing, rhonchi or rales Abd: soft, nontender, no hepatomegaly, + BS MS: no deformity or atrophy Ext: no edema Skin: warm and dry, no rash Neuro:  moves all extremities spontaneously, no focal abnormalities noted, follows commands Psych: euthymic mood, full affect   EKG:  Done today shows NSR 60bpm, early ST upsloping in several leads, I, II, III, avF, V5-V6 (no reciprocal changes), similar to prior tracing.  Recent Labs: No results found for requested labs within last 365 days.  No results found for requested labs within last 365 days.   CrCl cannot be calculated (Patient has no serum creatinine result on file.).   Wt Readings from Last 3 Encounters:  04/26/15 273 lb 6.4 oz (124.013 kg)  03/22/15 268 lb (121.564 kg)  06/09/14 262 lb 12.8 oz (119.205 kg)     Other studies reviewed: Additional studies/records reviewed today include: summarized above  ASSESSMENT AND PLAN:  1. Bicuspid aortic valve with mod AS, mild AI, dilated aortic root - reviewed echocardiogram result with Dr. Tamala Julian. No change in plan to repeat echo in 06/2016. The patient is completely asymptomatic from cardiac standpoint. 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 We will provide a letter. 2. Essential HTN - generally controlled. 3. Hyperlipidemia - he has not had this checked recently and is in between PCPs. He says he is fasting this AM and requests to have these drawn. 4. H/o SVT - s/p ablation. Remains quiescent. 5. Carotid bruits - suspect radiation from aortic valve disease but will check carotid duplex given HTN & HLD. No TIA symptoms reported. 6. Snoring - patient states this persisted despite sinus surgery last month. He thinks allergies may have  something to do with it. He just started allergy shots. He may need sleep study to exclude sleep apnea - will defer to DOT examiner whether this is mandated prior to their full clearance. He does not report apneic events or significant daytime fatigue. I asked the patient to discuss with his DOT provider.  Disposition: F/u with Dr. Tamala Julian in 1 year.  Current medicines are reviewed at length with the patient today.  The patient did not have any concerns regarding medicines.  Raechel Ache PA-C 04/26/2015 8:19 AM     [address] [place] [address] [phone] (office)  [phone] (fax)

## 2015-04-26 ENCOUNTER — Ambulatory Visit (INDEPENDENT_AMBULATORY_CARE_PROVIDER_SITE_OTHER): Payer: BLUE CROSS/BLUE SHIELD | Admitting: Physician Assistant

## 2015-04-26 ENCOUNTER — Encounter: Payer: Self-pay | Admitting: *Deleted

## 2015-04-26 ENCOUNTER — Encounter: Payer: Self-pay | Admitting: Physician Assistant

## 2015-04-26 ENCOUNTER — Other Ambulatory Visit: Payer: Self-pay | Admitting: Physician Assistant

## 2015-04-26 VITALS — BP 130/88 | HR 60 | Ht 73.0 in | Wt 273.4 lb

## 2015-04-26 DIAGNOSIS — I471 Supraventricular tachycardia: Secondary | ICD-10-CM | POA: Diagnosis not present

## 2015-04-26 DIAGNOSIS — R0683 Snoring: Secondary | ICD-10-CM

## 2015-04-26 DIAGNOSIS — E785 Hyperlipidemia, unspecified: Secondary | ICD-10-CM

## 2015-04-26 DIAGNOSIS — Q231 Congenital insufficiency of aortic valve: Secondary | ICD-10-CM

## 2015-04-26 DIAGNOSIS — R0989 Other specified symptoms and signs involving the circulatory and respiratory systems: Secondary | ICD-10-CM

## 2015-04-26 DIAGNOSIS — I350 Nonrheumatic aortic (valve) stenosis: Secondary | ICD-10-CM

## 2015-04-26 DIAGNOSIS — I10 Essential (primary) hypertension: Secondary | ICD-10-CM

## 2015-04-26 LAB — COMPREHENSIVE METABOLIC PANEL
ALT: 36 U/L (ref 9–46)
AST: 19 U/L (ref 10–40)
Albumin: 4.4 g/dL (ref 3.6–5.1)
Alkaline Phosphatase: 85 U/L (ref 40–115)
BUN: 10 mg/dL (ref 7–25)
CO2: 26 mmol/L (ref 20–31)
Calcium: 9.3 mg/dL (ref 8.6–10.3)
Chloride: 98 mmol/L (ref 98–110)
Creat: 1.09 mg/dL (ref 0.60–1.35)
Glucose, Bld: 147 mg/dL — ABNORMAL HIGH (ref 65–99)
Potassium: 4.6 mmol/L (ref 3.5–5.3)
Sodium: 136 mmol/L (ref 135–146)
Total Bilirubin: 0.6 mg/dL (ref 0.2–1.2)
Total Protein: 6.8 g/dL (ref 6.1–8.1)

## 2015-04-26 LAB — HEPATIC FUNCTION PANEL
ALT: 36 U/L (ref 9–46)
AST: 19 U/L (ref 10–40)
Albumin: 4.4 g/dL (ref 3.6–5.1)
Alkaline Phosphatase: 85 U/L (ref 40–115)
Bilirubin, Direct: 0.2 mg/dL (ref <=0.2)
Indirect Bilirubin: 0.4 mg/dL (ref 0.2–1.2)
Total Bilirubin: 0.6 mg/dL (ref 0.2–1.2)
Total Protein: 6.8 g/dL (ref 6.1–8.1)

## 2015-04-26 NOTE — Patient Instructions (Addendum)
Medication Instructions:  Your physician recommends that you continue on your current medications as directed. Please refer to the Current Medication list given to you today.   Labwork: TODAY:  CMET                 LFT  Testing/Procedures: Your physician has requested that you have a carotid duplex. This test is an ultrasound of the carotid arteries in your neck. It looks at blood flow through these arteries that supply the brain with blood. Allow one hour for this exam. There are no restrictions or special instructions.  DUE IN December 2017:  ECHO    Follow-Up: Your physician wants you to follow-up in:  [place].  You will receive a reminder letter in the mail two months in advance. If you don't receive a letter, please call our office to schedule the follow-up appointment.   Any Other Special Instructions Will Be Listed Below (If Applicable).  Please discuss with DOT regarding Sleep Apnea.

## 2015-04-27 ENCOUNTER — Other Ambulatory Visit: Payer: Self-pay | Admitting: *Deleted

## 2015-04-27 DIAGNOSIS — E785 Hyperlipidemia, unspecified: Secondary | ICD-10-CM

## 2015-04-27 DIAGNOSIS — I350 Nonrheumatic aortic (valve) stenosis: Secondary | ICD-10-CM

## 2015-04-27 DIAGNOSIS — I10 Essential (primary) hypertension: Secondary | ICD-10-CM

## 2015-04-27 LAB — LIPID PANEL
Cholesterol: 114 mg/dL — ABNORMAL LOW (ref 125–200)
HDL: 29 mg/dL — ABNORMAL LOW (ref >=40)
LDL Cholesterol: 62 mg/dL (ref <130)
Total CHOL/HDL Ratio: 3.9 Ratio (ref <=5.0)
Triglycerides: 117 mg/dL (ref <150)
VLDL: 23 mg/dL (ref <30)

## 2015-05-14 ENCOUNTER — Ambulatory Visit (HOSPITAL_COMMUNITY)
Admission: RE | Admit: 2015-05-14 | Discharge: 2015-05-14 | Disposition: A | Discharge status: Home / Self Care | Payer: BLUE CROSS/BLUE SHIELD | Source: Ambulatory Visit | Attending: Cardiovascular Disease | Admitting: Cardiovascular Disease

## 2015-05-14 ENCOUNTER — Encounter (HOSPITAL_COMMUNITY): Payer: Self-pay

## 2015-05-14 DIAGNOSIS — I10 Essential (primary) hypertension: Secondary | ICD-10-CM | POA: Diagnosis not present

## 2015-05-14 DIAGNOSIS — E7800 Pure hypercholesterolemia, unspecified: Secondary | ICD-10-CM | POA: Insufficient documentation

## 2015-05-14 DIAGNOSIS — R0989 Other specified symptoms and signs involving the circulatory and respiratory systems: Secondary | ICD-10-CM | POA: Diagnosis not present

## 2015-05-14 DIAGNOSIS — I6523 Occlusion and stenosis of bilateral carotid arteries: Secondary | ICD-10-CM | POA: Diagnosis not present

## 2015-05-14 HISTORY — DX: Peripheral vascular disease, unspecified: I73.9

## 2015-05-14 HISTORY — DX: Disorder of arteries and arterioles, unspecified: I77.9

## 2015-06-05 ENCOUNTER — Telehealth: Payer: Self-pay | Admitting: Internal Medicine

## 2015-06-05 NOTE — Telephone Encounter (Signed)
Left msg to call office to schedule flu shot/msn °

## 2015-07-05 ENCOUNTER — Encounter: Payer: BLUE CROSS/BLUE SHIELD | Admitting: Internal Medicine

## 2015-07-06 ENCOUNTER — Other Ambulatory Visit: Payer: Self-pay | Admitting: Interventional Cardiology

## 2015-07-19 ENCOUNTER — Encounter: Payer: Self-pay | Admitting: Internal Medicine

## 2015-07-19 ENCOUNTER — Telehealth: Payer: Self-pay | Admitting: *Deleted

## 2015-07-19 ENCOUNTER — Ambulatory Visit (INDEPENDENT_AMBULATORY_CARE_PROVIDER_SITE_OTHER): Payer: BLUE CROSS/BLUE SHIELD | Admitting: Internal Medicine

## 2015-07-19 VITALS — BP 120/80 | HR 61 | Temp 98.0°F | Resp 18 | Ht 72.0 in | Wt 282.0 lb

## 2015-07-19 DIAGNOSIS — R0683 Snoring: Secondary | ICD-10-CM

## 2015-07-19 DIAGNOSIS — Z9109 Other allergy status, other than to drugs and biological substances: Secondary | ICD-10-CM

## 2015-07-19 DIAGNOSIS — E119 Type 2 diabetes mellitus without complications: Secondary | ICD-10-CM | POA: Diagnosis not present

## 2015-07-19 DIAGNOSIS — I10 Essential (primary) hypertension: Secondary | ICD-10-CM

## 2015-07-19 DIAGNOSIS — E785 Hyperlipidemia, unspecified: Secondary | ICD-10-CM

## 2015-07-19 DIAGNOSIS — I471 Supraventricular tachycardia: Secondary | ICD-10-CM

## 2015-07-19 DIAGNOSIS — I2510 Atherosclerotic heart disease of native coronary artery without angina pectoris: Secondary | ICD-10-CM

## 2015-07-19 DIAGNOSIS — R400 Somnolence: Secondary | ICD-10-CM

## 2015-07-19 DIAGNOSIS — R635 Abnormal weight gain: Secondary | ICD-10-CM

## 2015-07-19 DIAGNOSIS — Z91048 Other nonmedicinal substance allergy status: Secondary | ICD-10-CM

## 2015-07-19 DIAGNOSIS — I350 Nonrheumatic aortic (valve) stenosis: Secondary | ICD-10-CM

## 2015-07-19 LAB — BASIC METABOLIC PANEL
BUN: 12 mg/dL (ref 6–23)
CO2: 30 mEq/L (ref 19–32)
Calcium: 9.5 mg/dL (ref 8.4–10.5)
Chloride: 102 mEq/L (ref 96–112)
Creatinine, Ser: 1.08 mg/dL (ref 0.40–1.50)
GFR: 76.98 mL/min (ref >60.00)
Glucose, Bld: 152 mg/dL — ABNORMAL HIGH (ref 70–99)
Potassium: 4.8 mEq/L (ref 3.5–5.1)
Sodium: 139 mEq/L (ref 135–145)

## 2015-07-19 LAB — CBC WITH DIFFERENTIAL/PLATELET
Basophils Absolute: 0.1 10*3/uL (ref 0.0–0.1)
Basophils Relative: 0.8 % (ref 0.0–3.0)
Eosinophils Absolute: 0.1 10*3/uL (ref 0.0–0.7)
Eosinophils Relative: 1.6 % (ref 0.0–5.0)
HCT: 50.1 % (ref 39.0–52.0)
Hemoglobin: 16.7 g/dL (ref 13.0–17.0)
Lymphocytes Relative: 22.7 % (ref 12.0–46.0)
Lymphs Abs: 1.9 10*3/uL (ref 0.7–4.0)
MCHC: 33.3 g/dL (ref 30.0–36.0)
MCV: 89.9 fl (ref 78.0–100.0)
Monocytes Absolute: 0.7 10*3/uL (ref 0.1–1.0)
Monocytes Relative: 8.8 % (ref 3.0–12.0)
Neutro Abs: 5.6 10*3/uL (ref 1.4–7.7)
Neutrophils Relative %: 66.1 % (ref 43.0–77.0)
Platelets: 191 10*3/uL (ref 150.0–400.0)
RBC: 5.57 Mil/uL (ref 4.22–5.81)
RDW: 14 % (ref 11.5–15.5)
WBC: 8.5 10*3/uL (ref 4.0–10.5)

## 2015-07-19 LAB — HEPATIC FUNCTION PANEL
ALT: 30 U/L (ref 0–53)
AST: 17 U/L (ref 0–37)
Albumin: 4.4 g/dL (ref 3.5–5.2)
Alkaline Phosphatase: 77 U/L (ref 39–117)
Bilirubin, Direct: 0.1 mg/dL (ref 0.0–0.3)
Total Bilirubin: 0.8 mg/dL (ref 0.2–1.2)
Total Protein: 6.4 g/dL (ref 6.0–8.3)

## 2015-07-19 LAB — HEMOGLOBIN A1C: Hgb A1c MFr Bld: 7.5 % — ABNORMAL HIGH (ref 4.6–6.5)

## 2015-07-19 LAB — LIPID PANEL
Cholesterol: 136 mg/dL (ref 0–200)
HDL: 26.7 mg/dL — ABNORMAL LOW (ref >39.00)
LDL Cholesterol: 82 mg/dL (ref 0–99)
NonHDL: 109.52
Total CHOL/HDL Ratio: 5
Triglycerides: 136 mg/dL (ref 0.0–149.0)
VLDL: 27.2 mg/dL (ref 0.0–40.0)

## 2015-07-19 LAB — TSH: TSH: 1.03 u[IU]/mL (ref 0.35–4.50)

## 2015-07-19 NOTE — Progress Notes (Signed)
Patient ID: Dalton [name], male   DOB: August 30, 1965, 50 y.o.   MRN: [id number]   Subjective:    Patient ID: Dalton [name], male    DOB: [birth year], 50 y.o.   MRN: [id number]  HPI  Patient with past history of hypercholesterolemia, aortic stenosis, hypertension, SVT and CAD.  He comes in today to follow up on these issues and to establish care.  Was previously seeing Dr Milagros Evener [facility] in [place].  He saw Dr Sharlett Iles recently (DOT physician).  a1c 7.1.  Has never been diagnosed with diabetes or elevated sugar.  Has until March to improve the a1c level.  Has been adjusting his diet.  Decreased tea intake.  Discussed diet and exercise.  No chest pain or tightness.  Sees cardiology.  Just evaluated.  They follow with serial echo's.  Is s/p ablation for SVT.  No sob.  No increased heart rate or palpitations.  No acid reflux.  No abdominal pain or cramping.  Bowels stable.  Takes zyrtec and gets allergy shots.  Quit smoking 5-6 years ago.  Sees Dr Kathyrn Sheriff.  S/p sinus surgery.  Stable.  Sees Dr Mayer Masker - urology.  Stable.  Follows psa.  Discussed his sugars and diet and exercise.  Discussed referral to a nutritionist.  He is accompanied by his wife.  History obtained from both of them.  He snores.  Increased daytime somnolence.  Has some witnessed apneic episodes - per wife. Is planning to have a split night sleep study.     Past Medical History  Diagnosis Date  . Hypertension   . Hypercholesterolemia   . Aortic valve stenosis     a. Bicuspid AV - 2D Echo 06/2014 - mod AS, mild AI, mildly dilated aortic root.  . SVT (supraventricular tachycardia) ([place])     a. h/o SVT - Ablation done 2013.  . Dilated aortic root ([place])     a. By echo 06/2014.  . Carotid artery disease ([place])     a. Mild by duplex 05/2015 - 1-39% BICA. Repeat due 05/2017.  [name] CAD (coronary artery disease)   . Environmental allergies    Past Surgical History  Procedure Laterality Date  . Tonsillectomy    .  Surgery for non descending testicle    . Cardiac electrophysiology study and ablation  08/21/2011  . Supraventricular tachycardia ablation N/A 08/21/2011    Procedure: SUPRAVENTRICULAR TACHYCARDIA ABLATION;  Surgeon: Evans Lance, MD;  Location: [facility] CATH LAB;  Service: Cardiovascular;  Laterality: N/A;  . Septoplasty N/A 03/22/2015    Procedure: SEPTOPLASTY  WITH RIGHT INFERIOR TURBINATE REDUCTION;  Surgeon: Margaretha Sheffield, MD;  Location: [place];  Service: ENT;  Laterality: N/A;   Family History  Problem Relation Age of Onset  . Diabetes    . Cancer    . Breast cancer Maternal Grandmother   . Cancer Maternal Grandfather   . Aortic stenosis Maternal Grandfather    Social History   Social History  . Marital Status: Married    Spouse Name: N/A  . Number of Children: N/A  . Years of Education: N/A   Occupational History  . truck driver    Social History Main Topics  . Smoking status: Former Smoker    Types: Cigarettes    Quit date: 11/04/1988  . Smokeless tobacco: Never Used  . Alcohol Use: 4.8 oz/week    7 Glasses of wine, 1 Shots of liquor per week     Comment: RED WINE EACH EVENING,  LIQUOR OCCASIONAL  . Drug Use: Yes     Comment: many years ago in highschool  . Sexual Activity: Not Currently   Other Topics Concern  . None   Social History Narrative   Very rare exercise    Outpatient Encounter Prescriptions as of 07/19/2015  Medication Sig  . atorvastatin (LIPITOR) 40 MG tablet TAKE ONE TABLET BY MOUTH ONCE DAILY  . hydrochlorothiazide (MICROZIDE) 12.5 MG capsule TAKE ONE CAPSULE BY MOUTH ONCE DAILY  . lisinopril (PRINIVIL,ZESTRIL) 20 MG tablet TAKE ONE TABLET BY MOUTH ONCE DAILY  . mometasone (NASONEX) 50 MCG/ACT nasal spray Place 2 sprays into the nose daily.  . tamsulosin (FLOMAX) 0.4 MG CAPS capsule Take 0.4 mg by mouth daily after supper.  . testosterone cypionate (DEPOTESTOSTERONE CYPIONATE) 200 MG/ML injection Inject 200 mg into the muscle every 14  (fourteen) days. Mondays  . [DISCONTINUED] vitamin C (ASCORBIC ACID) 500 MG tablet Take 1,000 mg by mouth daily.    No facility-administered encounter medications on file as of 07/19/2015.    Review of Systems  Constitutional: Negative for fever and chills.  HENT: Negative for congestion and sinus pressure.   Eyes: Negative for discharge and redness.  Respiratory: Negative for cough, chest tightness and shortness of breath.   Cardiovascular: Negative for chest pain, palpitations and leg swelling.  Gastrointestinal: Negative for nausea, vomiting, abdominal pain and diarrhea.  Genitourinary: Negative for dysuria and difficulty urinating.  Musculoskeletal: Negative for back pain and joint swelling.  Skin: Negative for color change and rash.  Neurological: Negative for dizziness, light-headedness and headaches.  Psychiatric/Behavioral: Negative for decreased concentration and agitation.       Objective:    Physical Exam  Constitutional: He appears well-developed and well-nourished. No distress.  HENT:  Nose: Nose normal.  Mouth/Throat: Oropharynx is clear and moist.  Eyes: Conjunctivae are normal. Right eye exhibits no discharge. Left eye exhibits no discharge.  Neck: Neck supple. No thyromegaly present.  Cardiovascular: Normal rate and regular rhythm.   Pulmonary/Chest: Effort normal and breath sounds normal. No respiratory distress.  Abdominal: Soft. Bowel sounds are normal. There is no tenderness.  Musculoskeletal: He exhibits no edema or tenderness.  Lymphadenopathy:    He has no cervical adenopathy.  Skin: No rash noted. No erythema.  Psychiatric: He has a normal mood and affect. His behavior is normal.    BP 120/80 mmHg  Pulse 61  Temp(Src) 98 F (36.7 C) (Oral)  Resp 18  Ht 6' (1.829 m)  Wt 282 lb (127.914 kg)  BMI 38.24 kg/m2  SpO2 95% Wt Readings from Last 3 Encounters:  07/19/15 282 lb (127.914 kg)  04/26/15 273 lb 6.4 oz (124.013 kg)  03/22/15 268 lb (121.564  kg)     Lab Results  Component Value Date   WBC 8.5 07/19/2015   HGB 16.7 07/19/2015   HCT 50.1 07/19/2015   PLT 191.0 07/19/2015   GLUCOSE 152* 07/19/2015   CHOL 136 07/19/2015   TRIG 136.0 07/19/2015   HDL 26.70* 07/19/2015   LDLCALC 82 07/19/2015   ALT 30 07/19/2015   AST 17 07/19/2015   NA 139 07/19/2015   K 4.8 07/19/2015   CL 102 07/19/2015   CREATININE 1.08 07/19/2015   BUN 12 07/19/2015   CO2 30 07/19/2015   TSH 1.03 07/19/2015   INR 1.0 08/14/2011   HGBA1C 7.5* 07/19/2015       Assessment & Plan:   Problem List Items Addressed This Visit    Aortic valve stenosis  History of aortic stenosis.  Followed by cardiology.  Followed with ECHOs.        CAD (coronary artery disease)    Continue risk factor modification.  Currently without symptoms.  Followed by cardiology.        Diabetes mellitus ([place]) - Primary    Recently found to have elevated a1c.  Discussed low carb diet and exercise.  Discussed weight loss.  Discussed referral to a nutritionist.  Check met b and a1c.        Relevant Orders   Hemoglobin A1c (Completed)   Ambulatory referral to diabetic education   Environmental allergies    Receiving allergy shots.  On zyrtec.        Essential hypertension    Blood pressure under good control.  Continue same medication regimen.  Follow pressures.  Follow metabolic panel.        Relevant Orders   CBC with Differential/Platelet (Completed)   Basic metabolic panel (Completed)   Hyperlipidemia    Low cholesterol diet and exercise.  Follow lipid panel and liver function tests.        Relevant Orders   Hepatic function panel (Completed)   Lipid panel (Completed)   SVT (supraventricular tachycardia) ([facility])    S/p ablation.  Currently doing well.         Other Visit Diagnoses    Weight gain        Relevant Orders    TSH (Completed)    Aortic stenosis            Einar Pheasant, MD

## 2015-07-19 NOTE — Progress Notes (Signed)
Pre-visit discussion using our clinic review tool. No additional management support is needed unless otherwise documented below in the visit note.  

## 2015-07-19 NOTE — Telephone Encounter (Signed)
Patient was seen on 07/18/14, he was  advise to have a Sleep study. Patient stated that he needed Dr. Nicki Reaper to place the order for a sleep study. Patient stated that Dr. Nicki Reaper was aware of this.  Please Advice [place], his wife [phone]

## 2015-07-19 NOTE — Telephone Encounter (Signed)
Please advise 

## 2015-07-20 ENCOUNTER — Telehealth: Payer: Self-pay | Admitting: Internal Medicine

## 2015-07-20 NOTE — Telephone Encounter (Signed)
I have placed the order for the sleep study.  Someone from the office will be contacting him with an appt date and time.

## 2015-07-22 ENCOUNTER — Encounter: Payer: Self-pay | Admitting: Internal Medicine

## 2015-07-22 DIAGNOSIS — I2510 Atherosclerotic heart disease of native coronary artery without angina pectoris: Secondary | ICD-10-CM | POA: Insufficient documentation

## 2015-07-22 DIAGNOSIS — Z9109 Other allergy status, other than to drugs and biological substances: Secondary | ICD-10-CM | POA: Insufficient documentation

## 2015-07-22 NOTE — Assessment & Plan Note (Signed)
History of aortic stenosis.  Followed by cardiology.  Followed with ECHOs.

## 2015-07-22 NOTE — Assessment & Plan Note (Signed)
Low cholesterol diet and exercise.  Follow lipid panel and liver function tests.  

## 2015-07-22 NOTE — Assessment & Plan Note (Addendum)
Continue risk factor modification.  Currently without symptoms.  Followed by cardiology.

## 2015-07-22 NOTE — Assessment & Plan Note (Signed)
Blood pressure under good control.  Continue same medication regimen.  Follow pressures.  Follow metabolic panel.   

## 2015-07-22 NOTE — Assessment & Plan Note (Signed)
S/p ablation.  Currently doing well.

## 2015-07-22 NOTE — Assessment & Plan Note (Signed)
Receiving allergy shots.  On zyrtec.

## 2015-07-22 NOTE — Assessment & Plan Note (Signed)
Recently found to have elevated a1c.  Discussed low carb diet and exercise.  Discussed weight loss.  Discussed referral to a nutritionist.  Check met b and a1c.

## 2015-07-23 ENCOUNTER — Encounter: Payer: Self-pay | Admitting: Internal Medicine

## 2015-07-23 ENCOUNTER — Telehealth: Payer: Self-pay

## 2015-07-23 MED ORDER — METFORMIN HCL 500 MG PO TABS: 500.0000 mg | ORAL_TABLET | Freq: Every day | ORAL | Status: DC

## 2015-07-23 NOTE — Telephone Encounter (Signed)
rx sent in for metformin 500mg  q day #30 with 2 refills.  Pt notified via my chart.

## 2015-07-23 NOTE — Telephone Encounter (Signed)
Pt.s wife stated that Dalton [name] wants to try execerise before going on Metformin and is that okay? Please advise/tvw

## 2015-07-23 NOTE — Telephone Encounter (Signed)
I reviewed this phone message and I also received a my chart message from the patient.  He informed me that he would start the medication.  Her message states he does not want to start.  Need to clarify what he wants to do.  Thanks.

## 2015-07-23 NOTE — Telephone Encounter (Signed)
I sent in rx for metformin.  I also sent pt a my chart message to let him know that I sent in the prescription and to inform him to check blood sugars bid and send in over the next couple of weeks.

## 2015-07-23 NOTE — Telephone Encounter (Signed)
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

## 2015-07-24 NOTE — Telephone Encounter (Signed)
Called and notified pt of Dr. Bary Leriche message./tvw

## 2015-07-25 ENCOUNTER — Other Ambulatory Visit: Payer: Self-pay | Admitting: *Deleted

## 2015-07-25 ENCOUNTER — Telehealth: Payer: Self-pay | Admitting: *Deleted

## 2015-07-25 ENCOUNTER — Encounter: Payer: Self-pay | Admitting: Internal Medicine

## 2015-07-25 MED ORDER — ONETOUCH ULTRASOFT LANCETS MISC: Status: DC

## 2015-07-25 MED ORDER — GLUCOSE BLOOD VI STRP: ORAL_STRIP | Status: DC

## 2015-07-25 NOTE — Telephone Encounter (Signed)
[facility], stated that the lancets and test strips were sent over, patient do not use [facility]. [facility]

## 2015-07-25 NOTE — Telephone Encounter (Signed)
Noted./tvw

## 2015-07-30 ENCOUNTER — Telehealth: Payer: Self-pay | Admitting: *Deleted

## 2015-07-30 ENCOUNTER — Encounter: Payer: Self-pay | Admitting: Internal Medicine

## 2015-07-30 NOTE — Telephone Encounter (Signed)
Opened in error

## 2015-08-03 ENCOUNTER — Ambulatory Visit: Payer: BLUE CROSS/BLUE SHIELD | Attending: Otolaryngology

## 2015-08-03 DIAGNOSIS — G4733 Obstructive sleep apnea (adult) (pediatric): Secondary | ICD-10-CM | POA: Insufficient documentation

## 2015-08-03 DIAGNOSIS — G2581 Restless legs syndrome: Secondary | ICD-10-CM | POA: Insufficient documentation

## 2015-08-03 DIAGNOSIS — R0683 Snoring: Secondary | ICD-10-CM | POA: Insufficient documentation

## 2015-08-03 DIAGNOSIS — I10 Essential (primary) hypertension: Secondary | ICD-10-CM | POA: Diagnosis not present

## 2015-08-03 DIAGNOSIS — E119 Type 2 diabetes mellitus without complications: Secondary | ICD-10-CM | POA: Insufficient documentation

## 2015-08-09 ENCOUNTER — Telehealth: Payer: Self-pay | Admitting: *Deleted

## 2015-08-09 NOTE — Telephone Encounter (Signed)
Placed in yellow.

## 2015-08-09 NOTE — Telephone Encounter (Signed)
Reviewed and discussed with pt at his wife's appt.

## 2015-08-09 NOTE — Telephone Encounter (Signed)
Pt dropped off sugar reading while he was here during his wife's appt.

## 2015-08-23 ENCOUNTER — Encounter: Payer: Self-pay | Admitting: Internal Medicine

## 2015-08-23 ENCOUNTER — Ambulatory Visit (INDEPENDENT_AMBULATORY_CARE_PROVIDER_SITE_OTHER): Payer: BLUE CROSS/BLUE SHIELD | Admitting: Internal Medicine

## 2015-08-23 VITALS — BP 126/82 | HR 60 | Temp 97.4°F | Resp 12 | Ht 72.0 in | Wt 261.2 lb

## 2015-08-23 DIAGNOSIS — E785 Hyperlipidemia, unspecified: Secondary | ICD-10-CM

## 2015-08-23 DIAGNOSIS — I350 Nonrheumatic aortic (valve) stenosis: Secondary | ICD-10-CM | POA: Diagnosis not present

## 2015-08-23 DIAGNOSIS — E119 Type 2 diabetes mellitus without complications: Secondary | ICD-10-CM | POA: Diagnosis not present

## 2015-08-23 DIAGNOSIS — I2510 Atherosclerotic heart disease of native coronary artery without angina pectoris: Secondary | ICD-10-CM | POA: Diagnosis not present

## 2015-08-23 DIAGNOSIS — Z125 Encounter for screening for malignant neoplasm of prostate: Secondary | ICD-10-CM

## 2015-08-23 DIAGNOSIS — I10 Essential (primary) hypertension: Secondary | ICD-10-CM

## 2015-08-23 DIAGNOSIS — R0981 Nasal congestion: Secondary | ICD-10-CM

## 2015-08-23 MED ORDER — FLUTICASONE PROPIONATE 50 MCG/ACT NA SUSP: 2.0000 | Freq: Every day | NASAL | Status: DC

## 2015-08-23 NOTE — Patient Instructions (Signed)
Saline nasal spray - flush nose at least 2-3x/day  flonase nasal spray - 2 sprays each nostril one time per day.  Do this in the evening.   

## 2015-08-23 NOTE — Progress Notes (Signed)
Patient ID: Dalton [name], male   DOB: Aug 26, 1965, 50 y.o.   MRN: [id number]   Subjective:    Patient ID: Dalton [name], male    DOB: May 25, 1966, 50 y.o.   MRN: [id number]  HPI  Patient with past history of hypercholesterolemia, aortic valve stenosis, hypertension, CAD and diabetes.  He comes in today for a scheduled follow up.  He is accompanied by his wife.  History obtained from both of them.  He has adjusted his diet.  Lost weight.  Sugars much improved.  Sugars mostly running 80-120s.  Much improved.  He stays physically active.  Does not do formal exercise.  Feels better.  No chest pain or tightness.  No sob.  No acid reflux.  No abdominal pain or cramping.  Bowels stable.     Past Medical History  Diagnosis Date  . Hypertension   . Hypercholesterolemia   . Aortic valve stenosis     a. Bicuspid AV - 2D Echo 06/2014 - mod AS, mild AI, mildly dilated aortic root.  . SVT (supraventricular tachycardia) ([place])     a. h/o SVT - Ablation done 2013.  . Dilated aortic root ([place])     a. By echo 06/2014.  . Carotid artery disease ([place])     a. Mild by duplex 05/2015 - 1-39% BICA. Repeat due 05/2017.  [name] CAD (coronary artery disease)   . Environmental allergies    Past Surgical History  Procedure Laterality Date  . Tonsillectomy    . Surgery for non descending testicle    . Cardiac electrophysiology study and ablation  08/21/2011  . Supraventricular tachycardia ablation N/A 08/21/2011    Procedure: SUPRAVENTRICULAR TACHYCARDIA ABLATION;  Surgeon: Evans Lance, MD;  Location[facility] CATH LAB;  Service: Cardiovascular;  Laterality: N/A;  . Septoplasty N/A 03/22/2015    Procedure: SEPTOPLASTY  WITH RIGHT INFERIOR TURBINATE REDUCTION;  Surgeon: Margaretha Sheffield, MD;  Location: [place];  Service: ENT;  Laterality: N/A;   Family History  Problem Relation Age of Onset  . Diabetes    . Cancer    . Breast cancer Maternal Grandmother   . Cancer Maternal Grandfather   . Aortic stenosis  Maternal Grandfather    Social History   Social History  . Marital Status: Married    Spouse Name: N/A  . Number of Children: N/A  . Years of Education: N/A   Occupational History  . truck driver    Social History Main Topics  . Smoking status: Former Smoker    Types: Cigarettes    Quit date: 11/04/1988  . Smokeless tobacco: Never Used  . Alcohol Use: 4.8 oz/week    7 Glasses of wine, 1 Shots of liquor per week     Comment: RED WINE EACH EVENING, LIQUOR OCCASIONAL  . Drug Use: Yes     Comment: many years ago in highschool  . Sexual Activity: Not Currently   Other Topics Concern  . None   Social History Narrative   Very rare exercise    Outpatient Encounter Prescriptions as of 08/23/2015  Medication Sig  . atorvastatin (LIPITOR) 40 MG tablet TAKE ONE TABLET BY MOUTH ONCE DAILY  . glucose blood test strip Use to check sugars twice a day. Dx-E11.9  . hydrochlorothiazide (MICROZIDE) 12.5 MG capsule TAKE ONE CAPSULE BY MOUTH ONCE DAILY  . Lancets (ONETOUCH ULTRASOFT) lancets Use to check sugars twice a day. Dx E11.9  . lisinopril (PRINIVIL,ZESTRIL) 20 MG tablet TAKE ONE TABLET BY MOUTH ONCE DAILY  .  metFORMIN (GLUCOPHAGE) 500 MG tablet Take 1 tablet (500 mg total) by mouth daily with breakfast.  . mometasone (NASONEX) 50 MCG/ACT nasal spray Place 2 sprays into the nose daily.  . tamsulosin (FLOMAX) 0.4 MG CAPS capsule Take 0.4 mg by mouth daily after supper.  . testosterone cypionate (DEPOTESTOSTERONE CYPIONATE) 200 MG/ML injection Inject 200 mg into the muscle every 14 (fourteen) days. Mondays  . fluticasone (FLONASE) 50 MCG/ACT nasal spray Place 2 sprays into both nostrils daily.   No facility-administered encounter medications on file as of 08/23/2015.    Review of Systems  Constitutional:       Has adjusted his diet.  Lost weight.  Feels better.  Energy better.    HENT: Negative for congestion and sinus pressure.   Eyes: Negative for discharge and redness.    Respiratory: Negative for cough, chest tightness and shortness of breath.   Cardiovascular: Negative for chest pain, palpitations and leg swelling.  Gastrointestinal: Negative for nausea, vomiting, abdominal pain and diarrhea.  Musculoskeletal: Negative for back pain and joint swelling.  Skin: Negative for color change and rash.  Neurological: Negative for dizziness, light-headedness and headaches.  Psychiatric/Behavioral: Negative for dysphoric mood and agitation.       Objective:    Physical Exam  Constitutional: He appears well-developed and well-nourished. No distress.  HENT:  Nose: Nose normal.  Mouth/Throat: Oropharynx is clear and moist.  Eyes: Right eye exhibits no discharge. Left eye exhibits no discharge.  Neck: Neck supple. No thyromegaly present.  Cardiovascular: Normal rate and regular rhythm.   Pulmonary/Chest: Effort normal and breath sounds normal. No respiratory distress.  Abdominal: Soft. Bowel sounds are normal. There is no tenderness.  Musculoskeletal: He exhibits no edema or tenderness.  Lymphadenopathy:    He has no cervical adenopathy.  Skin: No rash noted. No erythema.  Psychiatric: He has a normal mood and affect. His behavior is normal.    BP 126/82 mmHg  Pulse 60  Temp(Src) 97.4 F (36.3 C) (Oral)  Resp 12  Ht 6' (1.829 m)  Wt 261 lb 3.2 oz (118.48 kg)  BMI 35.42 kg/m2  SpO2 97% Wt Readings from Last 3 Encounters:  08/23/15 261 lb 3.2 oz (118.48 kg)  07/19/15 282 lb (127.914 kg)  04/26/15 273 lb 6.4 oz (124.013 kg)     Lab Results  Component Value Date   WBC 8.5 07/19/2015   HGB 16.7 07/19/2015   HCT 50.1 07/19/2015   PLT 191.0 07/19/2015   GLUCOSE 152* 07/19/2015   CHOL 136 07/19/2015   TRIG 136.0 07/19/2015   HDL 26.70* 07/19/2015   LDLCALC 82 07/19/2015   ALT 30 07/19/2015   AST 17 07/19/2015   NA 139 07/19/2015   K 4.8 07/19/2015   CL 102 07/19/2015   CREATININE 1.08 07/19/2015   BUN 12 07/19/2015   CO2 30 07/19/2015    TSH 1.03 07/19/2015   INR 1.0 08/14/2011   HGBA1C 7.5* 07/19/2015       Assessment & Plan:   Problem List Items Addressed This Visit    Aortic valve stenosis    History of aortic stenosis.  Followed by cardiology.        CAD (coronary artery disease)    Continue risk factor modification.  Currently without symptoms.  Followed by cardiology.        Diabetes mellitus ([place])    Has adjusted his diet.  Lost weight.  Sugars much improved.  Not on any medication.  Follow met b and a1c.  Relevant Orders   Hemoglobin A1c   Essential hypertension - Primary    Blood pressure under good control.  Continue same medication regimen.  Follow pressures.  Follow metabolic panel.        Relevant Orders   Basic metabolic panel   Hyperlipidemia    Low cholesterol diet and exercise.  Has adjusted his diet.  Lost weight.   Lab Results  Component Value Date   CHOL 136 07/19/2015   HDL 26.70* 07/19/2015   LDLCALC 82 07/19/2015   TRIG 136.0 07/19/2015   CHOLHDL 5 07/19/2015        Relevant Orders   Lipid panel   Hepatic function panel   Nasal congestion    Saline nasal spray and nasacort (flonase) as directed.  Follow.         Other Visit Diagnoses    Prostate cancer screening        Relevant Orders    PSA        Einar Pheasant, MD

## 2015-08-23 NOTE — Progress Notes (Signed)
Pre visit review using our clinic review tool, if applicable. No additional management support is needed unless otherwise documented below in the visit note. 

## 2015-08-26 ENCOUNTER — Encounter: Payer: Self-pay | Admitting: Internal Medicine

## 2015-08-26 DIAGNOSIS — R0981 Nasal congestion: Secondary | ICD-10-CM | POA: Insufficient documentation

## 2015-08-26 NOTE — Assessment & Plan Note (Signed)
Continue risk factor modification.  Currently without symptoms.  Followed by cardiology.

## 2015-08-26 NOTE — Assessment & Plan Note (Signed)
History of aortic stenosis.  Followed by cardiology.

## 2015-08-26 NOTE — Assessment & Plan Note (Signed)
Saline nasal spray and nasacort (flonase) as directed.  Follow.

## 2015-08-26 NOTE — Assessment & Plan Note (Signed)
Blood pressure under good control.  Continue same medication regimen.  Follow pressures.  Follow metabolic panel.   

## 2015-08-26 NOTE — Assessment & Plan Note (Signed)
Low cholesterol diet and exercise.  Has adjusted his diet.  Lost weight.   Lab Results  Component Value Date   CHOL 136 07/19/2015   HDL 26.70* 07/19/2015   LDLCALC 82 07/19/2015   TRIG 136.0 07/19/2015   CHOLHDL 5 07/19/2015

## 2015-08-26 NOTE — Assessment & Plan Note (Signed)
Has adjusted his diet.  Lost weight.  Sugars much improved.  Not on any medication.  Follow met b and a1c.

## 2015-09-20 ENCOUNTER — Ambulatory Visit: Payer: BLUE CROSS/BLUE SHIELD | Admitting: Internal Medicine

## 2015-09-25 ENCOUNTER — Encounter: Payer: Self-pay | Admitting: Internal Medicine

## 2015-10-22 ENCOUNTER — Encounter: Payer: Self-pay | Admitting: Internal Medicine

## 2015-10-23 ENCOUNTER — Encounter: Payer: Self-pay | Admitting: Internal Medicine

## 2015-10-23 NOTE — Telephone Encounter (Signed)
Letter typed and printed.  Please forward and notify pt.

## 2015-11-09 ENCOUNTER — Other Ambulatory Visit (INDEPENDENT_AMBULATORY_CARE_PROVIDER_SITE_OTHER): Payer: BLUE CROSS/BLUE SHIELD

## 2015-11-09 DIAGNOSIS — E785 Hyperlipidemia, unspecified: Secondary | ICD-10-CM

## 2015-11-09 DIAGNOSIS — Z125 Encounter for screening for malignant neoplasm of prostate: Secondary | ICD-10-CM | POA: Diagnosis not present

## 2015-11-09 DIAGNOSIS — E119 Type 2 diabetes mellitus without complications: Secondary | ICD-10-CM | POA: Diagnosis not present

## 2015-11-09 DIAGNOSIS — I10 Essential (primary) hypertension: Secondary | ICD-10-CM | POA: Diagnosis not present

## 2015-11-09 LAB — BASIC METABOLIC PANEL
BUN: 14 mg/dL (ref 6–23)
CO2: 28 mEq/L (ref 19–32)
Calcium: 9.2 mg/dL (ref 8.4–10.5)
Chloride: 104 mEq/L (ref 96–112)
Creatinine, Ser: 1.13 mg/dL (ref 0.40–1.50)
GFR: 72.97 mL/min (ref >60.00)
Glucose, Bld: 116 mg/dL — ABNORMAL HIGH (ref 70–99)
Potassium: 4.4 mEq/L (ref 3.5–5.1)
Sodium: 138 mEq/L (ref 135–145)

## 2015-11-09 LAB — HEPATIC FUNCTION PANEL
ALT: 22 U/L (ref 0–53)
AST: 14 U/L (ref 0–37)
Albumin: 4.5 g/dL (ref 3.5–5.2)
Alkaline Phosphatase: 55 U/L (ref 39–117)
Bilirubin, Direct: 0.1 mg/dL (ref 0.0–0.3)
Total Bilirubin: 0.8 mg/dL (ref 0.2–1.2)
Total Protein: 6.3 g/dL (ref 6.0–8.3)

## 2015-11-09 LAB — HEMOGLOBIN A1C: Hgb A1c MFr Bld: 5.8 % (ref 4.6–6.5)

## 2015-11-09 LAB — LIPID PANEL
Cholesterol: 215 mg/dL — ABNORMAL HIGH (ref 0–200)
HDL: 28.6 mg/dL — ABNORMAL LOW (ref >39.00)
LDL Cholesterol: 162 mg/dL — ABNORMAL HIGH (ref 0–99)
NonHDL: 186.08
Total CHOL/HDL Ratio: 8
Triglycerides: 118 mg/dL (ref 0.0–149.0)
VLDL: 23.6 mg/dL (ref 0.0–40.0)

## 2015-11-09 LAB — PSA: PSA: 1.03 ng/mL (ref 0.10–4.00)

## 2015-11-10 ENCOUNTER — Encounter: Payer: Self-pay | Admitting: Internal Medicine

## 2015-11-12 ENCOUNTER — Ambulatory Visit: Payer: BLUE CROSS/BLUE SHIELD | Admitting: Internal Medicine

## 2015-11-19 ENCOUNTER — Encounter: Payer: Self-pay | Admitting: Internal Medicine

## 2015-11-19 ENCOUNTER — Ambulatory Visit (INDEPENDENT_AMBULATORY_CARE_PROVIDER_SITE_OTHER): Payer: BLUE CROSS/BLUE SHIELD | Admitting: Internal Medicine

## 2015-11-19 VITALS — BP 120/80 | HR 85 | Temp 98.0°F | Resp 18 | Ht 72.0 in | Wt 256.0 lb

## 2015-11-19 DIAGNOSIS — Z9109 Other allergy status, other than to drugs and biological substances: Secondary | ICD-10-CM

## 2015-11-19 DIAGNOSIS — I350 Nonrheumatic aortic (valve) stenosis: Secondary | ICD-10-CM | POA: Diagnosis not present

## 2015-11-19 DIAGNOSIS — I2510 Atherosclerotic heart disease of native coronary artery without angina pectoris: Secondary | ICD-10-CM

## 2015-11-19 DIAGNOSIS — I10 Essential (primary) hypertension: Secondary | ICD-10-CM | POA: Diagnosis not present

## 2015-11-19 DIAGNOSIS — Z91048 Other nonmedicinal substance allergy status: Secondary | ICD-10-CM

## 2015-11-19 DIAGNOSIS — E119 Type 2 diabetes mellitus without complications: Secondary | ICD-10-CM | POA: Diagnosis not present

## 2015-11-19 DIAGNOSIS — E785 Hyperlipidemia, unspecified: Secondary | ICD-10-CM

## 2015-11-19 NOTE — Progress Notes (Signed)
Pre-visit discussion using our clinic review tool. No additional management support is needed unless otherwise documented below in the visit note.  

## 2015-11-19 NOTE — Progress Notes (Signed)
Patient ID: Dalton [name], male   DOB: 05-21-1966, 50 y.o.   MRN: [id number]   Subjective:    Patient ID: Dalton [name], male    DOB: 1965/12/24, 50 y.o.   MRN: [id number]  HPI  Patient here for a scheduled follow up.  He has adjusted his diet.  Lost weight.  Feels better.  Sugars better.  On no medication.  a1c just checked 5.8.  Stopped taking cholesterol medication.  LDL elevated.  Discussed results and discussed the need to start back on his cholesterol medication.  No cardiac symptoms with increased activity or exertion.  No sob.  Stays physically active.  No abdominal pain or cramping.  Bowels stable.     Past Medical History  Diagnosis Date  . Hypertension   . Hypercholesterolemia   . Aortic valve stenosis     a. Bicuspid AV - 2D Echo 06/2014 - mod AS, mild AI, mildly dilated aortic root.  . SVT (supraventricular tachycardia) ([place])     a. h/o SVT - Ablation done 2013.  . Dilated aortic root ([place])     a. By echo 06/2014.  . Carotid artery disease ([place])     a. Mild by duplex 05/2015 - 1-39% BICA. Repeat due 05/2017.  [name] CAD (coronary artery disease)   . Environmental allergies    Past Surgical History  Procedure Laterality Date  . Tonsillectomy    . Surgery for non descending testicle    . Cardiac electrophysiology study and ablation  08/21/2011  . Supraventricular tachycardia ablation N/A 08/21/2011    Procedure: SUPRAVENTRICULAR TACHYCARDIA ABLATION;  Surgeon: Evans Lance, MD;  Location: [facility] CATH LAB;  Service: Cardiovascular;  Laterality: N/A;  . Septoplasty N/A 03/22/2015    Procedure: SEPTOPLASTY  WITH RIGHT INFERIOR TURBINATE REDUCTION;  Surgeon: Margaretha Sheffield, MD;  Location: [place];  Service: ENT;  Laterality: N/A;   Family History  Problem Relation Age of Onset  . Diabetes    . Cancer    . Breast cancer Maternal Grandmother   . Cancer Maternal Grandfather   . Aortic stenosis Maternal Grandfather    Social History   Social History  . Marital  Status: Married    Spouse Name: N/A  . Number of Children: N/A  . Years of Education: N/A   Occupational History  . truck driver    Social History Main Topics  . Smoking status: Former Smoker    Types: Cigarettes    Quit date: 11/04/1988  . Smokeless tobacco: Never Used  . Alcohol Use: 4.8 oz/week    7 Glasses of wine, 1 Shots of liquor per week     Comment: RED WINE EACH EVENING, LIQUOR OCCASIONAL  . Drug Use: Yes     Comment: many years ago in highschool  . Sexual Activity: Not Currently   Other Topics Concern  . None   Social History Narrative   Very rare exercise    Outpatient Encounter Prescriptions as of 11/19/2015  Medication Sig  . atorvastatin (LIPITOR) 40 MG tablet TAKE ONE TABLET BY MOUTH ONCE DAILY  . cetirizine (ZYRTEC) 10 MG tablet Take by mouth.  . fluticasone (FLONASE) 50 MCG/ACT nasal spray Place 2 sprays into both nostrils daily.  [name] glucose blood test strip Use to check sugars twice a day. Dx-E11.9  . hydrochlorothiazide (MICROZIDE) 12.5 MG capsule TAKE ONE CAPSULE BY MOUTH ONCE DAILY  . Lancets (ONETOUCH ULTRASOFT) lancets Use to check sugars twice a day. Dx E11.9  . lisinopril (PRINIVIL,ZESTRIL) 20  MG tablet TAKE ONE TABLET BY MOUTH ONCE DAILY  . tamsulosin (FLOMAX) 0.4 MG CAPS capsule Take 0.4 mg by mouth daily after supper.  . testosterone cypionate (DEPOTESTOSTERONE CYPIONATE) 200 MG/ML injection Inject 200 mg into the muscle every 14 (fourteen) days. Mondays  . [DISCONTINUED] mometasone (NASONEX) 50 MCG/ACT nasal spray Place 2 sprays into the nose daily.  . metFORMIN (GLUCOPHAGE) 500 MG tablet Take 1 tablet (500 mg total) by mouth daily with breakfast. (Patient not taking: Reported on 11/19/2015)   No facility-administered encounter medications on file as of 11/19/2015.    Review of Systems  Constitutional: Negative for appetite change and unexpected weight change.  HENT: Negative for congestion and sinus pressure.   Respiratory: Negative for cough,  chest tightness and shortness of breath.   Cardiovascular: Negative for chest pain, palpitations and leg swelling.  Gastrointestinal: Negative for nausea, vomiting, abdominal pain and diarrhea.  Genitourinary: Negative for dysuria and difficulty urinating.  Musculoskeletal: Negative for myalgias and joint swelling.  Skin: Negative for color change and rash.  Neurological: Negative for dizziness, light-headedness and headaches.  Psychiatric/Behavioral: Negative for dysphoric mood and agitation.       Objective:    Physical Exam  Constitutional: He appears well-developed and well-nourished. No distress.  HENT:  Nose: Nose normal.  Mouth/Throat: Oropharynx is clear and moist.  Neck: Neck supple. No thyromegaly present.  Cardiovascular: Normal rate and regular rhythm.   Pulmonary/Chest: Effort normal and breath sounds normal. No respiratory distress.  Abdominal: Soft. Bowel sounds are normal. There is no tenderness.  Musculoskeletal: He exhibits no edema or tenderness.  Lymphadenopathy:    He has no cervical adenopathy.  Skin: No rash noted. No erythema.  Psychiatric: He has a normal mood and affect. His behavior is normal.    BP 120/80 mmHg  Pulse 85  Temp(Src) 98 F (36.7 C) (Oral)  Resp 18  Ht 6' (1.829 m)  Wt 256 lb (116.121 kg)  BMI 34.71 kg/m2  SpO2 97% Wt Readings from Last 3 Encounters:  11/19/15 256 lb (116.121 kg)  08/23/15 261 lb 3.2 oz (118.48 kg)  07/19/15 282 lb (127.914 kg)     Lab Results  Component Value Date   WBC 8.5 07/19/2015   HGB 16.7 07/19/2015   HCT 50.1 07/19/2015   PLT 191.0 07/19/2015   GLUCOSE 116* 11/09/2015   CHOL 215* 11/09/2015   TRIG 118.0 11/09/2015   HDL 28.60* 11/09/2015   LDLCALC 162* 11/09/2015   ALT 22 11/09/2015   AST 14 11/09/2015   NA 138 11/09/2015   K 4.4 11/09/2015   CL 104 11/09/2015   CREATININE 1.13 11/09/2015   BUN 14 11/09/2015   CO2 28 11/09/2015   TSH 1.03 07/19/2015   PSA 1.03 11/09/2015   INR 1.0  08/14/2011   HGBA1C 5.8 11/09/2015       Assessment & Plan:   Problem List Items Addressed This Visit    Aortic valve stenosis - Primary    History of AS.  Followed by cardiology.       CAD (coronary artery disease)    Currently without symptoms.  Continue risk factor modification.        Diabetes mellitus ([facility])    a1c 5.8.  Improved.  Has adjusted diet.  Stays physically active.  Follow met b and a1c.        Relevant Orders   Hemoglobin F7P   Basic metabolic panel   Environmental allergies    Receiving allergy shots.  Continues on  zyrtec.  Follow.       Essential hypertension    Blood pressure under good control.  Continue same medication regimen.  Follow pressures.  Follow metabolic panel.        Hyperlipidemia    Off lipitor.  Low cholesterol diet and exercise.  Follow lipid panel and liver function tests.  Restart lipitor.        Relevant Orders   Lipid panel   Hepatic function panel       Einar Pheasant, MD

## 2015-12-02 ENCOUNTER — Encounter: Payer: Self-pay | Admitting: Internal Medicine

## 2015-12-02 NOTE — Assessment & Plan Note (Signed)
Off lipitor.  Low cholesterol diet and exercise.  Follow lipid panel and liver function tests.  Restart lipitor.

## 2015-12-02 NOTE — Assessment & Plan Note (Signed)
Receiving allergy shots.  Continues on zyrtec.  Follow.

## 2015-12-02 NOTE — Assessment & Plan Note (Signed)
Currently without symptoms.  Continue risk factor modification.

## 2015-12-02 NOTE — Assessment & Plan Note (Signed)
Blood pressure under good control.  Continue same medication regimen.  Follow pressures.  Follow metabolic panel.   

## 2015-12-02 NOTE — Assessment & Plan Note (Signed)
a1c 5.8.  Improved.  Has adjusted diet.  Stays physically active.  Follow met b and a1c.

## 2015-12-02 NOTE — Assessment & Plan Note (Signed)
History of AS.  Followed by cardiology.

## 2015-12-07 ENCOUNTER — Other Ambulatory Visit: Payer: Self-pay | Admitting: Internal Medicine

## 2015-12-09 ENCOUNTER — Other Ambulatory Visit: Payer: Self-pay | Admitting: Internal Medicine

## 2015-12-11 ENCOUNTER — Telehealth: Payer: Self-pay | Admitting: Interventional Cardiology

## 2015-12-11 NOTE — Telephone Encounter (Signed)
Spoke with wife and she was wondering if pt needed an echo because she thought he had one done recently. Advised that I do not see a recent echo.  Wife is going to call pt to clarify and call our office back.

## 2015-12-11 NOTE — Telephone Encounter (Signed)
New Message:   Pt wants to know if he needs to schedule an echo test for this year?

## 2015-12-20 ENCOUNTER — Ambulatory Visit: Payer: BLUE CROSS/BLUE SHIELD | Admitting: Internal Medicine

## 2016-01-01 ENCOUNTER — Other Ambulatory Visit: Payer: Self-pay

## 2016-01-01 NOTE — Telephone Encounter (Signed)
Spoke with pt's wife. Pt and his wife are aware that the pt is due for his echo in Dec 2017. Echo appt already scheduled for 06/09/16 @ 7am. Pt wife voiced appreciation for the call.

## 2016-02-25 ENCOUNTER — Other Ambulatory Visit: Payer: BLUE CROSS/BLUE SHIELD

## 2016-02-28 ENCOUNTER — Encounter: Payer: BLUE CROSS/BLUE SHIELD | Admitting: Internal Medicine

## 2016-05-01 ENCOUNTER — Other Ambulatory Visit (INDEPENDENT_AMBULATORY_CARE_PROVIDER_SITE_OTHER): Payer: BLUE CROSS/BLUE SHIELD

## 2016-05-01 DIAGNOSIS — E119 Type 2 diabetes mellitus without complications: Secondary | ICD-10-CM

## 2016-05-01 DIAGNOSIS — E785 Hyperlipidemia, unspecified: Secondary | ICD-10-CM

## 2016-05-01 LAB — LIPID PANEL
Cholesterol: 154 mg/dL (ref 0–200)
HDL: 35.2 mg/dL — ABNORMAL LOW (ref >39.00)
LDL Cholesterol: 98 mg/dL (ref 0–99)
NonHDL: 119.01
Total CHOL/HDL Ratio: 4
Triglycerides: 104 mg/dL (ref 0.0–149.0)
VLDL: 20.8 mg/dL (ref 0.0–40.0)

## 2016-05-01 LAB — BASIC METABOLIC PANEL
BUN: 22 mg/dL (ref 6–23)
CO2: 29 mEq/L (ref 19–32)
Calcium: 9.3 mg/dL (ref 8.4–10.5)
Chloride: 103 mEq/L (ref 96–112)
Creatinine, Ser: 1.33 mg/dL (ref 0.40–1.50)
GFR: 60.34 mL/min (ref >60.00)
Glucose, Bld: 112 mg/dL — ABNORMAL HIGH (ref 70–99)
Potassium: 4.6 mEq/L (ref 3.5–5.1)
Sodium: 139 mEq/L (ref 135–145)

## 2016-05-01 LAB — HEPATIC FUNCTION PANEL
ALT: 29 U/L (ref 0–53)
AST: 17 U/L (ref 0–37)
Albumin: 4.4 g/dL (ref 3.5–5.2)
Alkaline Phosphatase: 65 U/L (ref 39–117)
Bilirubin, Direct: 0.2 mg/dL (ref 0.0–0.3)
Total Bilirubin: 1.1 mg/dL (ref 0.2–1.2)
Total Protein: 6.8 g/dL (ref 6.0–8.3)

## 2016-05-01 LAB — HEMOGLOBIN A1C: Hgb A1c MFr Bld: 6.4 % (ref 4.6–6.5)

## 2016-05-02 ENCOUNTER — Encounter: Payer: Self-pay | Admitting: Internal Medicine

## 2016-05-05 ENCOUNTER — Ambulatory Visit (INDEPENDENT_AMBULATORY_CARE_PROVIDER_SITE_OTHER): Payer: BLUE CROSS/BLUE SHIELD | Admitting: Internal Medicine

## 2016-05-05 ENCOUNTER — Encounter: Payer: Self-pay | Admitting: Internal Medicine

## 2016-05-05 VITALS — BP 120/84 | HR 89 | Temp 97.8°F | Ht 72.05 in | Wt 263.0 lb

## 2016-05-05 DIAGNOSIS — E785 Hyperlipidemia, unspecified: Secondary | ICD-10-CM | POA: Diagnosis not present

## 2016-05-05 DIAGNOSIS — G4730 Sleep apnea, unspecified: Secondary | ICD-10-CM

## 2016-05-05 DIAGNOSIS — E119 Type 2 diabetes mellitus without complications: Secondary | ICD-10-CM

## 2016-05-05 DIAGNOSIS — Z0000 Encounter for general adult medical examination without abnormal findings: Secondary | ICD-10-CM

## 2016-05-05 DIAGNOSIS — I10 Essential (primary) hypertension: Secondary | ICD-10-CM

## 2016-05-05 DIAGNOSIS — I2510 Atherosclerotic heart disease of native coronary artery without angina pectoris: Secondary | ICD-10-CM

## 2016-05-05 DIAGNOSIS — I471 Supraventricular tachycardia: Secondary | ICD-10-CM

## 2016-05-05 DIAGNOSIS — I350 Nonrheumatic aortic (valve) stenosis: Secondary | ICD-10-CM

## 2016-05-05 NOTE — Progress Notes (Signed)
Pre visit review using our clinic review tool, if applicable. No additional management support is needed unless otherwise documented below in the visit note. 

## 2016-05-05 NOTE — Progress Notes (Signed)
Patient ID: Dalton [name], male   DOB: [birth year], 50 y.o.   MRN: [id number]   Subjective:    Patient ID: Dalton [name], male    DOB: [birth year], 50 y.o.   MRN: [id number]  HPI  Patient here for a physical exam.  Sees urology for prostate checks.  Due to f/u with Dr Mayer Masker.  Has been trying to watch his diet.  Not doing as well, but plans to get back in his routine.  Discussed diet and exercise.  No acid reflux.  No abdominal pain or cramping.  Bowels stable.  No urine change.  Has sleep apnea.  Does not have as much energy as he thought with using CPAP.  Discussed auto titration.  He is in agreement.  No chest pain.  No sob. Discussed referral to nutritionist to help with his diabetes and diet adjustment.     Past Medical History:  Diagnosis Date  . Aortic valve stenosis    a. Bicuspid AV - 2D Echo 06/2014 - mod AS, mild AI, mildly dilated aortic root.  [name] CAD (coronary artery disease)   . Carotid artery disease ([place])    a. Mild by duplex 05/2015 - 1-39% BICA. Repeat due 05/2017.  . Dilated aortic root ([place])    a. By echo 06/2014.  . Environmental allergies   . Hypercholesterolemia   . Hypertension   . SVT (supraventricular tachycardia) ([place])    a. h/o SVT - Ablation done 2013.   Past Surgical History:  Procedure Laterality Date  . CARDIAC ELECTROPHYSIOLOGY STUDY AND ABLATION  08/21/2011  . SEPTOPLASTY N/A 03/22/2015   Procedure: SEPTOPLASTY  WITH RIGHT INFERIOR TURBINATE REDUCTION;  Surgeon: Margaretha Sheffield, MD;  Location: [place];  Service: ENT;  Laterality: N/A;  . SUPRAVENTRICULAR TACHYCARDIA ABLATION N/A 08/21/2011   Procedure: SUPRAVENTRICULAR TACHYCARDIA ABLATION;  Surgeon: Evans Lance, MD;  Location: [facility] CATH LAB;  Service: Cardiovascular;  Laterality: N/A;  . surgery for non descending testicle    . TONSILLECTOMY     Family History  Problem Relation Age of Onset  . Diabetes    . Cancer    . Breast cancer Maternal Grandmother   . Cancer Maternal  Grandfather   . Aortic stenosis Maternal Grandfather    Social History   Social History  . Marital status: Married    Spouse name: N/A  . Number of children: N/A  . 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 Smoking status: Former Smoker    Types: Cigarettes    Quit date: 11/04/1988  . Smokeless tobacco: Never Used  . Alcohol use 4.8 oz/week    7 Glasses of wine, 1 Shots of liquor per week     Comment: [place]  . Drug use:      Comment: many years ago in highschool  . Sexual activity: Not Currently   Other Topics Concern  . None   Social History Narrative   Very rare exercise    Outpatient Encounter Prescriptions as of 05/05/2016  Medication Sig  . atorvastatin (LIPITOR) 40 MG tablet Take 1 tablet (40 mg total) by mouth daily.  . cetirizine (ZYRTEC) 10 MG tablet Take by mouth.  . fluticasone (FLONASE) 50 MCG/ACT nasal spray PLACE 2 SPRAYS INTO BOTH NOSTRILS DAILY  . fluticasone (FLONASE) 50 MCG/ACT nasal spray PLACE 2 SPRAYS INTO BOTH NOSTRILS DAILY  . glucose blood test strip Use  to check sugars twice a day. Dx-E11.9  . hydrochlorothiazide (MICROZIDE) 12.5 MG capsule TAKE ONE CAPSULE BY MOUTH ONCE DAILY  . Lancets (ONETOUCH ULTRASOFT) lancets Use to check sugars twice a day. Dx E11.9  . lisinopril (PRINIVIL,ZESTRIL) 20 MG tablet TAKE ONE TABLET BY MOUTH ONCE DAILY  . tamsulosin (FLOMAX) 0.4 MG CAPS capsule Take 0.4 mg by mouth daily after supper.  . [DISCONTINUED] atorvastatin (LIPITOR) 40 MG tablet TAKE ONE TABLET BY MOUTH ONCE DAILY  . [DISCONTINUED] metFORMIN (GLUCOPHAGE) 500 MG tablet Take 1 tablet (500 mg total) by mouth daily with breakfast. (Patient not taking: Reported on 11/19/2015)  . [DISCONTINUED] testosterone cypionate (DEPOTESTOSTERONE CYPIONATE) 200 MG/ML injection Inject 200 mg into the muscle every 14 (fourteen) days. Mondays   No facility-administered  encounter medications on file as of 05/05/2016.     Review of Systems  Constitutional: Negative for appetite change and unexpected weight change.  HENT: Negative for congestion and sinus pressure.   Eyes: Negative for pain and visual disturbance.  Respiratory: Negative for cough, chest tightness and shortness of breath.   Cardiovascular: Negative for chest pain, palpitations and leg swelling.  Gastrointestinal: Negative for abdominal pain, diarrhea, nausea and vomiting.  Genitourinary: Negative for difficulty urinating and dysuria.  Musculoskeletal: Negative for back pain and joint swelling.  Skin: Negative for color change and rash.  Neurological: Negative for dizziness, light-headedness and headaches.  Hematological: Negative for adenopathy. Does not bruise/bleed easily.  Psychiatric/Behavioral: Negative for agitation and dysphoric mood.       Objective:     Blood pressure rechecked by me:  122/84  Physical Exam  Constitutional: He is oriented to person, place, and time. He appears well-developed and well-nourished. No distress.  HENT:  Head: Normocephalic and atraumatic.  Nose: Nose normal.  Mouth/Throat: Oropharynx is clear and moist. No oropharyngeal exudate.  Eyes: Conjunctivae are normal. Right eye exhibits no discharge. Left eye exhibits no discharge.  Neck: Neck supple. No thyromegaly present.  Cardiovascular: Normal rate and regular rhythm.   Pulmonary/Chest: Breath sounds normal. No respiratory distress. He has no wheezes.  Abdominal: Soft. Bowel sounds are normal. There is no tenderness.  Genitourinary:  Genitourinary Comments: Performed by urology.   Musculoskeletal: He exhibits no edema or tenderness.  Lymphadenopathy:    He has no cervical adenopathy.  Neurological: He is alert and oriented to person, place, and time.  Skin: Skin is warm and dry. No rash noted. No erythema.  Psychiatric: He has a normal mood and affect. His behavior is normal.    BP 120/84    Pulse 89   Temp 97.8 F (36.6 C) (Oral)   Ht 6' 0.05" (1.83 m)   Wt 263 lb (119.3 kg)   SpO2 95%   BMI 35.62 kg/m  Wt Readings from Last 3 Encounters:  05/05/16 263 lb (119.3 kg)  11/19/15 256 lb (116.1 kg)  08/23/15 261 lb 3.2 oz (118.5 kg)     Lab Results  Component Value Date   WBC 8.5 07/19/2015   HGB 16.7 07/19/2015   HCT 50.1 07/19/2015   PLT 191.0 07/19/2015   GLUCOSE 112 (H) 05/01/2016   CHOL 154 05/01/2016   TRIG 104.0 05/01/2016   HDL 35.20 (L) 05/01/2016   LDLCALC 98 05/01/2016   ALT 29 05/01/2016   AST 17 05/01/2016   NA 139 05/01/2016   K 4.6 05/01/2016   CL 103 05/01/2016   CREATININE 1.33 05/01/2016   BUN 22 05/01/2016   CO2 29 05/01/2016   TSH 1.03  07/19/2015   PSA 1.03 11/09/2015   INR 1.0 08/14/2011   HGBA1C 6.4 05/01/2016       Assessment & Plan:   Problem List Items Addressed This Visit    Aortic valve stenosis    Followed by cardiology.        Relevant Medications   atorvastatin (LIPITOR) 40 MG tablet   CAD (coronary artery disease)    Continue risk factor modification.  Continue f/u with cardiology.        Relevant Medications   atorvastatin (LIPITOR) 40 MG tablet   Diabetes mellitus ([place])    Discussed diet and exercise.  a1c just checked 6.4.  Desires referral to nutritionist.  Follow met b and a1c.        Relevant Medications   atorvastatin (LIPITOR) 40 MG tablet   Other Relevant Orders   Ambulatory referral to Nutrition and Diabetic Education   Essential hypertension    Blood pressure under good control.  Continue same medication regimen.  Follow pressures.  Follow metabolic panel.        Relevant Medications   atorvastatin (LIPITOR) 40 MG tablet   Hyperlipidemia    Low cholesteorl diet and exercise.  Follow lipid panel and liver function tests.  On lipitor.        Relevant Medications   atorvastatin (LIPITOR) 40 MG tablet   Sleep apnea    Uses CPAP.  Does not feel as rested as would like.  Discussed with him  regarding doing - auto titration to confirm settings ok.        SVT (supraventricular tachycardia) ([facility])    S/p ablation.  Doing well.  Follow.  Has f/u with cardiology.        Relevant Medications   atorvastatin (LIPITOR) 40 MG tablet       Einar Pheasant, MD

## 2016-05-08 ENCOUNTER — Encounter: Payer: Self-pay | Admitting: Interventional Cardiology

## 2016-05-11 ENCOUNTER — Telehealth: Payer: Self-pay | Admitting: Internal Medicine

## 2016-05-11 ENCOUNTER — Encounter: Payer: Self-pay | Admitting: Internal Medicine

## 2016-05-11 DIAGNOSIS — G4730 Sleep apnea, unspecified: Secondary | ICD-10-CM | POA: Insufficient documentation

## 2016-05-11 MED ORDER — ATORVASTATIN CALCIUM 40 MG PO TABS: 40.0000 mg | ORAL_TABLET | Freq: Every day | ORAL | 5 refills | Status: DC

## 2016-05-11 NOTE — Telephone Encounter (Signed)
My chart message sent to pt to ask about auto titration.

## 2016-05-11 NOTE — Assessment & Plan Note (Signed)
Blood pressure under good control.  Continue same medication regimen.  Follow pressures.  Follow metabolic panel.   

## 2016-05-11 NOTE — Assessment & Plan Note (Signed)
Discussed diet and exercise.  a1c just checked 6.4.  Desires referral to nutritionist.  Follow met b and a1c.

## 2016-05-11 NOTE — Assessment & Plan Note (Signed)
Followed by cardiology 

## 2016-05-11 NOTE — Assessment & Plan Note (Signed)
S/p ablation.  Doing well.  Follow.  Has f/u with cardiology.

## 2016-05-11 NOTE — Assessment & Plan Note (Signed)
Uses CPAP.  Does not feel as rested as would like.  Discussed with him regarding doing - auto titration to confirm settings ok.

## 2016-05-11 NOTE — Assessment & Plan Note (Signed)
Continue risk factor modification.  Continue f/u with cardiology.

## 2016-05-11 NOTE — Assessment & Plan Note (Signed)
Low cholesteorl diet and exercise.  Follow lipid panel and liver function tests.  On lipitor.

## 2016-05-12 ENCOUNTER — Other Ambulatory Visit: Payer: Self-pay | Admitting: Internal Medicine

## 2016-05-12 DIAGNOSIS — G4730 Sleep apnea, unspecified: Secondary | ICD-10-CM

## 2016-05-12 NOTE — Progress Notes (Signed)
Order placed for auto titration.

## 2016-05-23 ENCOUNTER — Encounter: Payer: Self-pay | Admitting: Interventional Cardiology

## 2016-05-23 ENCOUNTER — Ambulatory Visit (INDEPENDENT_AMBULATORY_CARE_PROVIDER_SITE_OTHER): Payer: BLUE CROSS/BLUE SHIELD | Admitting: Interventional Cardiology

## 2016-05-23 ENCOUNTER — Encounter: Payer: Self-pay | Admitting: *Deleted

## 2016-05-23 VITALS — BP 138/78 | HR 83 | Ht 73.0 in | Wt 263.0 lb

## 2016-05-23 DIAGNOSIS — I471 Supraventricular tachycardia, unspecified: Secondary | ICD-10-CM

## 2016-05-23 DIAGNOSIS — G4733 Obstructive sleep apnea (adult) (pediatric): Secondary | ICD-10-CM

## 2016-05-23 DIAGNOSIS — I481 Persistent atrial fibrillation: Secondary | ICD-10-CM

## 2016-05-23 DIAGNOSIS — I10 Essential (primary) hypertension: Secondary | ICD-10-CM | POA: Diagnosis not present

## 2016-05-23 DIAGNOSIS — I4891 Unspecified atrial fibrillation: Secondary | ICD-10-CM | POA: Insufficient documentation

## 2016-05-23 DIAGNOSIS — I350 Nonrheumatic aortic (valve) stenosis: Secondary | ICD-10-CM

## 2016-05-23 DIAGNOSIS — I4819 Other persistent atrial fibrillation: Secondary | ICD-10-CM

## 2016-05-23 MED ORDER — APIXABAN 5 MG PO TABS: 5.0000 mg | ORAL_TABLET | Freq: Two times a day (BID) | ORAL | 6 refills | Status: DC

## 2016-05-23 NOTE — Progress Notes (Addendum)
Cardiology Office Note    Date:  05/23/2016   ID:  Dalton [name], DOB [birth year], MRN [id number]  PCP:  Einar Pheasant, MD  Cardiologist: Sinclair Grooms, MD   Chief Complaint  Patient presents with  . Coronary Artery Disease  . Cardiac Valve Problem    History of Present Illness:  Dalton [name] is a 50 y.o. male history of biscupid aortic valve with AS, prior SVT s/p RF ablation, HTN, HLD who presents for follow-up. Last echo 06/2014: mod LVH, EF 60-65%, grade 1 DD, severely calcified AV leaflets (bicuspid by prior report, poorly visualized on this study), moderate aortic stenosis with mild AI, mildly dilated ascending aorta - repeat planned in 2 years.   Vane is doing well. He is asymptomatic. No chest pain, palpitations, syncope, or other complaints. He denies orthopnea PND.  Past Medical History:  Diagnosis Date  . Aortic valve stenosis    a. Bicuspid AV - 2D Echo 06/2014 - mod AS, mild AI, mildly dilated aortic root.  [name] CAD (coronary artery disease)   . Carotid artery disease ([facility])    a. Mild by duplex 05/2015 - 1-39% BICA. Repeat due 05/2017.  . Dilated aortic root ([place])    a. By echo 06/2014.  . Environmental allergies   . Hypercholesterolemia   . Hypertension   . SVT (supraventricular tachycardia) ([place])    a. h/o SVT - Ablation done 2013.    Past Surgical History:  Procedure Laterality Date  . CARDIAC ELECTROPHYSIOLOGY STUDY AND ABLATION  08/21/2011  . SEPTOPLASTY N/A 03/22/2015   Procedure: SEPTOPLASTY  WITH RIGHT INFERIOR TURBINATE REDUCTION;  Surgeon: Margaretha Sheffield, MD;  [facility];  Service: ENT;  Laterality: N/A;  . SUPRAVENTRICULAR TACHYCARDIA ABLATION N/A 08/21/2011   Procedure: SUPRAVENTRICULAR TACHYCARDIA ABLATION;  Surgeon: Evans Lance, MD;  Location[facility] C[facility] LAB;  Service: Cardiovascular;  Laterality: N/A;  . surgery for non descending testicle    . TONSILLECTOMY      Current Medications: Outpatient Medications  Prior to Visit  Medication Sig Dispense Refill  . atorvastatin (LIPITOR) 40 MG tablet Take 1 tablet (40 mg total) by mouth daily. 30 tablet 5  . cetirizine (ZYRTEC) 10 MG tablet Take by mouth.    . fluticasone (FLONASE) 50 MCG/ACT nasal spray PLACE 2 SPRAYS INTO BOTH NOSTRILS DAILY 16 g 2  . fluticasone (FLONASE) 50 MCG/ACT nasal spray PLACE 2 SPRAYS INTO BOTH NOSTRILS DAILY 16 g 2  . glucose blood test strip Use to check sugars twice a day. Dx-E11.9 100 each 12  . hydrochlorothiazide (MICROZIDE) 12.5 MG capsule TAKE ONE CAPSULE BY MOUTH ONCE DAILY 30 capsule 11  . Lancets (ONETOUCH ULTRASOFT) lancets Use to check sugars twice a day. Dx E11.9 100 each 12  . lisinopril (PRINIVIL,ZESTRIL) 20 MG tablet TAKE ONE TABLET BY MOUTH ONCE DAILY 30 tablet 11  . tamsulosin (FLOMAX) 0.4 MG CAPS capsule Take 0.4 mg by mouth daily after supper.     No facility-administered medications prior to visit.      Allergies:   Tomato   Social History   Social History  . Marital status: Married    Spouse name: N/A  . Number of children: N/A  . 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 Smoking status: Former Smoker    Types: Cigarettes    Quit date: 11/04/1988  . Smokeless tobacco: Never Used  . Alcohol  use 4.8 oz/week    7 Glasses of wine, 1 Shots of liquor per week     Comment: [place]  . Drug use:      Comment: many years ago in highschool  . Sexual activity: Not Currently   Other Topics Concern  . None   Social History Narrative   Very rare exercise     Family History:  The patient's family history includes Aortic stenosis in his maternal grandfather; Breast cancer in his maternal grandmother; Cancer in his maternal grandfather.   ROS:   Please see the history of present illness.    Low back discomfort but otherwise unremarkable  All other systems reviewed and are negative.   PHYSICAL  EXAM:   VS:  BP 138/78   Pulse 83   Ht 6\' 1"  (1.854 m)   Wt 263 lb (119.3 kg)   BMI 34.70 kg/m    GEN: Well nourished, well developed, in no acute distress  HEENT: normal  Neck: no JVD, carotid bruits, or masses Cardiac: IIRR; 2/6 systolic murmur. No rubs, or gallops,no edema . Respiratory:  clear to auscultation bilaterally, normal work of breathing GI: soft, nontender, nondistended, + BS MS: no deformity or atrophy  Skin: warm and dry, no rash Neuro:  Alert and Oriented x 3, Strength and sensation are intact Psych: euthymic mood, full affect  Wt Readings from Last 3 Encounters:  05/23/16 263 lb (119.3 kg)  05/05/16 263 lb (119.3 kg)  11/19/15 256 lb (116.1 kg)      Studies/Labs Reviewed:   EKG:  EKG  Atrial fibrillation, new when compared to prior tracings with adequate rate control at 85 bpm. Otherwise unremarkable.  Recent Labs: 07/19/2015: Hemoglobin 16.7; Platelets 191.0; TSH 1.03 05/01/2016: ALT 29; BUN 22; Creatinine, Ser 1.33; Potassium 4.6; Sodium 139   Lipid Panel    Component Value Date/Time   CHOL 154 05/01/2016 0804   TRIG 104.0 05/01/2016 0804   HDL 35.20 (L) 05/01/2016 0804   CHOLHDL 4 05/01/2016 0804   VLDL 20.8 05/01/2016 0804   LDLCALC 98 05/01/2016 0804    Additional studies/ records that were reviewed today include:  No recent imaging data.    ASSESSMENT:    1. Persistent atrial fibrillation ([place])   2. Nonrheumatic aortic valve stenosis   3. Essential hypertension   4. SVT (supraventricular tachycardia) ([place])   5. Obstructive sleep apnea      PLAN:  In order of problems listed above:  1. Atrial fibrillation was picked up on routine electrocardiography today. The patient is totally asymptomatic. He has a CHADS VASC of 2 related hypertension and diastolic heart failure. He also has structural heart disease in the form of nonrheumatic bicuspid aortic stenosis. We will start Eliquis 5 mg twice a day, 48-hour Holter monitor will be  performed, and the 2-D Doppler echocardiogram will be done. We will have follow-up in 4 weeks. Atrial fibrillation was discussed with the patient in detail. 2. There is a systolic murmur. In 2015 the valve demonstrated mild to moderate AS and regurgitation. Murmur is not significant today. Echocardiogram will assess progression. 3. Blood pressure is adequately controlled although I would prefer targets of 130/90 mmHg or less. 4. Not addressed. 5. Obstructive sleep apnea is being managed with C PAP. The relationship between sleep apnea and atrial fibrillation were discussed.    Medication Adjustments/Labs and Tests Ordered: Current medicines are reviewed at length with the patient today.  Concerns regarding medicines are outlined above.  Medication changes, Labs and Tests ordered today are listed in the Patient Instructions below. There are no Patient Instructions on file for this visit.   Signed, Sinclair Grooms, MD  05/23/2016 8:38 AM    [facility] [address], [place], [place]  [address] Phone: [phone]; Fax: [phone]

## 2016-05-23 NOTE — Patient Instructions (Signed)
Medication Instructions:  Your physician has recommended you make the following change in your medication:  Start Eliquis 5 mg by mouth twice daily.    Labwork: none  Testing/Procedures: Your physician has recommended that you wear a holter monitor. Holter monitors are medical devices that record the heart's electrical activity. Doctors most often use these monitors to diagnose arrhythmias. Arrhythmias are problems with the speed or rhythm of the heartbeat. The monitor is a small, portable device. You can wear one while you do your normal daily activities. This is usually used to diagnose what is causing palpitations/syncope (passing out).  Keep scheduled appointment for echocardiogram on 06/09/16.   Follow-Up: .Your physician recommends that you schedule a follow-up appointment in: about 4 weeks.  Scheduled for December 18,2017 at 8:45    Any Other Special Instructions Will Be Listed Below (If Applicable).     If you need a refill on your cardiac medications before your next appointment, please call your pharmacy.

## 2016-06-04 ENCOUNTER — Ambulatory Visit (INDEPENDENT_AMBULATORY_CARE_PROVIDER_SITE_OTHER): Payer: BLUE CROSS/BLUE SHIELD

## 2016-06-04 DIAGNOSIS — I481 Persistent atrial fibrillation: Secondary | ICD-10-CM

## 2016-06-04 DIAGNOSIS — I4819 Other persistent atrial fibrillation: Secondary | ICD-10-CM

## 2016-06-08 ENCOUNTER — Encounter: Payer: Self-pay | Admitting: Internal Medicine

## 2016-06-08 DIAGNOSIS — M79676 Pain in unspecified toe(s): Secondary | ICD-10-CM

## 2016-06-08 DIAGNOSIS — L600 Ingrowing nail: Secondary | ICD-10-CM

## 2016-06-09 ENCOUNTER — Ambulatory Visit (HOSPITAL_COMMUNITY): Payer: BLUE CROSS/BLUE SHIELD | Attending: Cardiology

## 2016-06-09 ENCOUNTER — Other Ambulatory Visit: Payer: Self-pay

## 2016-06-09 DIAGNOSIS — I350 Nonrheumatic aortic (valve) stenosis: Secondary | ICD-10-CM

## 2016-06-09 DIAGNOSIS — I2510 Atherosclerotic heart disease of native coronary artery without angina pectoris: Secondary | ICD-10-CM | POA: Insufficient documentation

## 2016-06-09 DIAGNOSIS — I10 Essential (primary) hypertension: Secondary | ICD-10-CM | POA: Insufficient documentation

## 2016-06-09 DIAGNOSIS — Z87891 Personal history of nicotine dependence: Secondary | ICD-10-CM | POA: Insufficient documentation

## 2016-06-09 DIAGNOSIS — E785 Hyperlipidemia, unspecified: Secondary | ICD-10-CM | POA: Insufficient documentation

## 2016-06-09 DIAGNOSIS — I4891 Unspecified atrial fibrillation: Secondary | ICD-10-CM | POA: Diagnosis not present

## 2016-06-09 DIAGNOSIS — Q231 Congenital insufficiency of aortic valve: Secondary | ICD-10-CM | POA: Insufficient documentation

## 2016-06-10 NOTE — Telephone Encounter (Signed)
Order placed for podiatry referral.   

## 2016-06-11 ENCOUNTER — Ambulatory Visit: Payer: Self-pay | Admitting: Podiatry

## 2016-06-20 ENCOUNTER — Encounter: Payer: BLUE CROSS/BLUE SHIELD | Attending: Internal Medicine | Admitting: Dietician

## 2016-06-20 DIAGNOSIS — E6609 Other obesity due to excess calories: Secondary | ICD-10-CM | POA: Diagnosis not present

## 2016-06-20 DIAGNOSIS — Z6835 Body mass index (BMI) 35.0-35.9, adult: Secondary | ICD-10-CM

## 2016-06-20 NOTE — Patient Instructions (Addendum)
Ask MD about [facility]'s cardiac rehab program [facility]). Balance 3 meals/day with 2-4 oz. Lean protein, 3-4 servings of carbohydrate and non-starchy vegetables. Include a minimum of 5 servings of fruits/vegetables with an ideal goal of 8 servings daily. Read labels for saturated fat and limit to no more than 13 gms of saturated fat per day. Limit sodium to no more than 2000mg  per day. Limit eggs to 3-4 per week; Mix a whole egg with egg whites to increase volume. Work to establish a more consistent meal pattern on the weekends of 3 meals and 1-2 snacks. Take raw vegetables and fruit when "on the road". Decrease caffeine intake.

## 2016-06-20 NOTE — Progress Notes (Signed)
Medical Nutrition Therapy: Visit start time: 1330  end time: 1440 Assessment:  Diagnosis: Diabetes Past medical history:hyperlipidemia, CAD, aortic valve stenosis, hypertension, A-fib Psychosocial issues/ stress concerns: Patient rates his stress as "moderate" and copes by "eating". Preferred learning method:  . Hands-on  Current weight: 270.5 lbs Height: 73 in Medications, supplements: see list Progress and evaluation:  Patient accompanied by his wife in for initial medical nutrition therapy appointment. Patient's last HgA1c, 10/'17 was 6.5. He expresses a desire to lose weight. He has gained approximately 15 lbs since May '17. He reports he has very little energy, especially since he discontinued testosterone due to lack of insurance coverage. 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 He takes mainly canned meats to eat during the day. He likes most vegetables and eats them for  lunch on days he is at home. He doesn't eat a meal after work but eats peanut butter and honey or ice cream. He recently has decreased snacks in the evening such as chips and sweets. His wife stated, "We pig out on the weekends". His present diet is low in fruits, vegetables, whole grains, fiber and calcium sources and excessive in caffeine. He drinks at least 8 cups of regular coffee per day. Patient's wife is very supportive and does the cooking and states that she is willing to make diet changes as well.   Physical activity: He has an appointment this Monday 06/23/16 with his cardiologist and I encouraged him to ask about recommended exercise level.  Dietary Intake:  Usual eating pattern includes 2 meals and 1-2 snacks per day with more on weekends Dining out frequency:2 meals per week.  Breakfast: 5:30am- 3-4 eggs with cheese, sausage, coffee Lunch: 11:45-tuna or chicken salad, vegetables if at home or canned calamari or sardines or oysters when on the  road. Snack:coffee Supper: does not usually eat a meal; Mixes up a bowl of peanut butter and honey 7:30pm- bedtime Beverages: 8 bottles of water, 8-10 cups coffee Nutrition Care Education:   Weight control/Diabetes:  Instructed on a meal plan for diabetes based on 2000 calories to promote weight loss including carbohydrate counting and how to better balance meals with protein, carbohydrate, fat and non-starchy vegetables. Stressed importance of spreading food throughout the day. Discussed meal options when on the road and for the meal in the evening.  Hyperlipidemia: Included dietary guidelines for heart health including recommendations for saturated fat, trans fat and sodium and recommendations for fruit/vegetable servings. Encouraged to limit caffeine related to PVC diagnosis and to talk with cardiologist further about this.  Nutritional Diagnosis:  [place]-3.3 Overweight/obesity As related to history of excessive snacking and inconsistent meal pattern on weekends with excessive snacks and higher calorie foods.  As evidenced by diet history..  Intervention:  Ask MD about [facility]'s cardiac rehab program [facility]). Balance 3 meals/day with 2-4 oz. Lean protein, 3-4 servings of carbohydrate and non-starchy vegetables. Include a minimum of 5 servings of fruits/vegetables with an ideal goal of 8 servings daily. Read labels for saturated fat and limit to no more than 13 gms of saturated fat per day. Limit sodium to no more than 2000mg  per day. Limit eggs to 3-4 per week; Mix a whole egg with egg whites to increase volume. Work to establish a more consistent meal pattern on the weekends of 3 meals and 1-2 snacks. Take raw vegetables and fruit when "on the road". Decrease caffeine intake.  Education Materials given:  . Food lists/  Planning A Balanced Meal . Sample meal pattern/ menus . Goals/ instructions Learner/ who was taught:  . Patient  . Spouse/ partner  Level of understanding: . Partial  understanding; needs review/ practice Learning barriers: . None Willingness to learn/ readiness for change: . Acceptance, ready for change Monitoring and Evaluation:  Patient did not schedule a follow-up appointment at this time. Encouraged he or his wife to call if has further questions or if decides to schedule a follow-up.

## 2016-06-23 ENCOUNTER — Telehealth: Payer: Self-pay | Admitting: Interventional Cardiology

## 2016-06-23 ENCOUNTER — Encounter: Payer: Self-pay | Admitting: Interventional Cardiology

## 2016-06-23 ENCOUNTER — Ambulatory Visit (INDEPENDENT_AMBULATORY_CARE_PROVIDER_SITE_OTHER): Payer: BLUE CROSS/BLUE SHIELD | Admitting: Interventional Cardiology

## 2016-06-23 VITALS — BP 130/88 | HR 90 | Ht 73.0 in | Wt 269.8 lb

## 2016-06-23 DIAGNOSIS — G4730 Sleep apnea, unspecified: Secondary | ICD-10-CM

## 2016-06-23 DIAGNOSIS — I10 Essential (primary) hypertension: Secondary | ICD-10-CM

## 2016-06-23 DIAGNOSIS — E785 Hyperlipidemia, unspecified: Secondary | ICD-10-CM

## 2016-06-23 DIAGNOSIS — I4819 Other persistent atrial fibrillation: Secondary | ICD-10-CM

## 2016-06-23 DIAGNOSIS — I350 Nonrheumatic aortic (valve) stenosis: Secondary | ICD-10-CM | POA: Diagnosis not present

## 2016-06-23 DIAGNOSIS — I2510 Atherosclerotic heart disease of native coronary artery without angina pectoris: Secondary | ICD-10-CM

## 2016-06-23 DIAGNOSIS — I481 Persistent atrial fibrillation: Secondary | ICD-10-CM

## 2016-06-23 MED ORDER — DABIGATRAN ETEXILATE MESYLATE 150 MG PO CAPS: 150.0000 mg | ORAL_CAPSULE | Freq: Two times a day (BID) | ORAL | 3 refills | Status: DC

## 2016-06-23 MED ORDER — METOPROLOL SUCCINATE ER 25 MG PO TB24: 25.0000 mg | ORAL_TABLET | Freq: Every day | ORAL | 3 refills | Status: DC

## 2016-06-23 NOTE — Telephone Encounter (Signed)
Pradaxa 150 mg by mouth twice a day

## 2016-06-23 NOTE — Telephone Encounter (Signed)
New Message  Pt wife call to inform the pradaxa is the cheaper medication for pts insurance. Please call back to discuss if needed

## 2016-06-23 NOTE — Telephone Encounter (Signed)
Will route to Dr. Tamala Julian for advisement on medication.

## 2016-06-23 NOTE — Telephone Encounter (Signed)
Spoke with wife, DPR on file.  Made her aware of medication instructions.  Wife verbalized understanding and was appreciative for assistance.

## 2016-06-23 NOTE — Patient Instructions (Signed)
Medication Instructions:  1) START Metoprolol Succinate 25mg  once daily. 2) Contact your insurance in regards to which anticoagulation medication will be cheapest. (Pradaxa, Xarelto, Eliquis and Savaysa)  Labwork: None  Testing/Procedures: None  Follow-Up: Your physician wants you to follow-up in: 6 months with Dr. Tamala Julian.  You will receive a reminder letter in the mail two months in advance. If you don't receive a letter, please call our office to schedule the follow-up appointment.   Any Other Special Instructions Will Be Listed Below (If Applicable).     If you need a refill on your cardiac medications before your next appointment, please call your pharmacy.

## 2016-06-23 NOTE — Progress Notes (Signed)
Cardiology Office Note    Date:  06/23/2016   ID:  Dalton [name], DOB [birth year], MRN [id number]  PCP:  Einar Pheasant, MD  Cardiologist: Sinclair Grooms, MD   Chief Complaint  Patient presents with  . Atrial Fibrillation  . Cardiac Valve Problem    History of Present Illness:  Dalton [name] is a 50 y.o. male history of biscupid aortic valve with AS, prior SVT s/p RF ablation, HTN, HLD who presents for follow-up. Last echo 06/2014: mod LVH, EF 60-65%, grade 1 DD, severely calcified AV leaflets (bicuspid by prior report, poorly visualized on this study), moderate aortic stenosis with mild AI, mildly dilated ascending aorta - repeat planned in 2 years.  He is doing well. He was not able to start anticoagulation therapy due to cost. He has not yet started beta blocker therapy. He is asymptomatic.  He has continuous atrial fibrillation with moderate rate control. We plan to start metoprolol succinate 25 mg daily for better rate control. We recommended Eliquis but the insurance plan coverage was not affordable.   Past Medical History:  Diagnosis Date  . Aortic valve stenosis    a. Bicuspid AV - 2D Echo 06/2014 - mod AS, mild AI, mildly dilated aortic root.  [name] CAD (coronary artery disease)   . Carotid artery disease ([place])    a. Mild by duplex 05/2015 - 1-39% BICA. Repeat due 05/2017.  . Dilated aortic root ([place])    a. By echo 06/2014.  . Environmental allergies   . Hypercholesterolemia   . Hypertension   . SVT (supraventricular tachycardia) ([place])    a. h/o SVT - Ablation done 2013.    Past Surgical History:  Procedure Laterality Date  . CARDIAC ELECTROPHYSIOLOGY STUDY AND ABLATION  08/21/2011  . SEPTOPLASTY N/A 03/22/2015   Procedure: SEPTOPLASTY  WITH RIGHT INFERIOR TURBINATE REDUCTION;  Surgeon: Margaretha Sheffield, MD;  Location: [facility];  Service: ENT;  Laterality: N/A;  . SUPRAVENTRICULAR TACHYCARDIA ABLATION N/A 08/21/2011   Procedure: SUPRAVENTRICULAR  TACHYCARDIA ABLATION;  Surgeon: Evans Lance, MD;  Location[facility] CATH LAB;  Service: Cardiovascular;  Laterality: N/A;  . surgery for non descending testicle    . TONSILLECTOMY      Current Medications: Outpatient Medications Prior to Visit  Medication Sig Dispense Refill  . atorvastatin (LIPITOR) 40 MG tablet Take 1 tablet (40 mg total) by mouth daily. 30 tablet 5  . cetirizine (ZYRTEC) 10 MG tablet Take by mouth.    . fluticasone (FLONASE) 50 MCG/ACT nasal spray PLACE 2 SPRAYS INTO BOTH NOSTRILS DAILY 16 g 2  . fluticasone (FLONASE) 50 MCG/ACT nasal spray PLACE 2 SPRAYS INTO BOTH NOSTRILS DAILY 16 g 2  . glucose blood test strip Use to check sugars twice a day. Dx-E11.9 100 each 12  . hydrochlorothiazide (MICROZIDE) 12.5 MG capsule TAKE ONE CAPSULE BY MOUTH ONCE DAILY 30 capsule 11  . Lancets (ONETOUCH ULTRASOFT) lancets Use to check sugars twice a day. Dx E11.9 100 each 12  . lisinopril (PRINIVIL,ZESTRIL) 20 MG tablet TAKE ONE TABLET BY MOUTH ONCE DAILY 30 tablet 11  . apixaban (ELIQUIS) 5 MG TABS tablet Take 1 tablet (5 mg total) by mouth 2 (two) times daily. (Patient not taking: Reported on 06/23/2016) 60 tablet 6  . tamsulosin (FLOMAX) 0.4 MG CAPS capsule Take 0.4 mg by mouth daily after supper.     No facility-administered medications prior to visit.      Allergies:   Tomato   Social History  Social History  . Marital status: Married    Spouse name: N/A  . Number of children: N/A  . 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 Smoking status: Former Smoker    Types: Cigarettes    Quit date: 11/04/1988  . Smokeless tobacco: Never Used  . Alcohol use 4.8 oz/week    7 Glasses of wine, 1 Shots of liquor per week     Comment: [place]  . Drug use:      Comment: many years ago in highschool  . Sexual activity: Not Currently   Other Topics Concern  . None   Social History  Narrative   Very rare exercise     Family History:  The patient's family history includes Aortic stenosis in his maternal grandfather; Breast cancer in his maternal grandmother; Cancer in his maternal grandfather.   ROS:   Please see the history of present illness.    None  All other systems reviewed and are negative.   PHYSICAL EXAM:   VS:  BP 130/88 (BP Location: Left Arm)   Pulse 90   Ht 6\' 1"  (1.854 m)   Wt 269 lb 12.8 oz (122.4 kg)   BMI 35.60 kg/m    GEN: Well nourished, well developed, in no acute distress  HEENT: normal  Neck: no JVD, carotid bruits, or masses Cardiac: IIRR; 3/6 crescendo decrescendo Murmur of AS; No, rubs, or gallops,no edema . Respiratory:  clear to auscultation bilaterally, normal work of breathing GI: soft, nontender, nondistended, + BS MS: no deformity or atrophy  Skin: warm and dry, no rash Neuro:  Alert and Oriented x 3, Strength and sensation are intact Psych: euthymic mood, full affect  Wt Readings from Last 3 Encounters:  06/23/16 269 lb 12.8 oz (122.4 kg)  05/23/16 263 lb (119.3 kg)  05/05/16 263 lb (119.3 kg)      Studies/Labs Reviewed:   EKG:  EKG  none  Recent Labs: 07/19/2015: Hemoglobin 16.7; Platelets 191.0; TSH 1.03 05/01/2016: ALT 29; BUN 22; Creatinine, Ser 1.33; Potassium 4.6; Sodium 139   Lipid Panel    Component Value Date/Time   CHOL 154 05/01/2016 0804   TRIG 104.0 05/01/2016 0804   HDL 35.20 (L) 05/01/2016 0804   CHOLHDL 4 05/01/2016 0804   VLDL 20.8 05/01/2016 0804   LDLCALC 98 05/01/2016 0804    Additional studies/ records that were reviewed today include:   November 2017 48 hour Holter: Study Highlights     Continuous atrial fibrillation  Occasional PVC's  No pauses greater than 3 seconds    Continuous atrial fibrillation  Occasional PVC's     Echocardiogram 06/09/2016: Study Conclusions  - Left ventricle: The cavity size was normal. Systolic function was   normal. The estimated  ejection fraction was in the range of 50%   to 55%. Although no diagnostic regional wall motion abnormality   was identified, this possibility cannot be completely excluded on   the basis of this study. - Aortic valve: Bicuspid; severely thickened, severely calcified   leaflets; fusion of the right-noncoronary commissure. Valve   mobility was restricted. There was mild stenosis. There was   moderate regurgitation. Peak velocity (S): 235 cm/s. Mean   gradient (S): 11 mm Hg. (62mmHg previously. Current measurement   likely represents an understimation of stenosis). Valve area   (VTI): 1.48 cm^2. Valve area (Vmax): 1.34 cm^2. Valve area   (  Vmean): 1.31 cm^2. - Aorta: Aortic root dimension: 38 mm (ED). - Ascending aorta: The ascending aorta was mildly dilated. - Left atrium: The atrium was mildly dilated. - Right ventricle: The cavity size was moderately dilated. Wall   thickness was normal. - Right atrium: The atrium was moderately dilated.   ASSESSMENT:    1. Nonrheumatic aortic valve stenosis   2. Coronary artery disease involving native coronary artery of native heart without angina pectoris   3. Essential hypertension   4. Persistent atrial fibrillation ([place])   5. Sleep apnea, unspecified type   6. Hyperlipidemia, unspecified hyperlipidemia type      PLAN:  In order of problems listed above:  1. Stable without progression to significant hemodynamic abnormality. 2. Stable without angina. 3. Adequate blood pressure control on the current medical regimen. He will improve with low-dose beta blocker therapy. 4. Atrial fibrillation rate control will be improved with metoprolol succinate 25 mg per day. He has not yet started anticoagulation therapy. The CHADS VASC is 2 and yearly stroke rate 2.5% . Adhesion H he definitely needs anticoagulation therapy. This was discussed in detail with the patient and his wife. We will try to make plans with in his health care plan to start [facility]. He  is against therapy with Coumadin. 5. Continue C Pap therapy.    Medication Adjustments/Labs and Tests Ordered: Current medicines are reviewed at length with the patient today.  Concerns regarding medicines are outlined above.  Medication changes, Labs and Tests ordered today are listed in the Patient Instructions below. There are no Patient Instructions on file for this visit.   Signed, Sinclair Grooms, MD  06/23/2016 8:56 AM    [facility] [address], [place], [place]  [address] Phone: [phone]; Fax: [phone]

## 2016-06-23 NOTE — Telephone Encounter (Signed)
Left message to call back  

## 2016-06-27 ENCOUNTER — Ambulatory Visit: Payer: BLUE CROSS/BLUE SHIELD | Attending: Otolaryngology

## 2016-06-27 ENCOUNTER — Encounter: Payer: Self-pay | Admitting: Internal Medicine

## 2016-06-27 DIAGNOSIS — Z6834 Body mass index (BMI) 34.0-34.9, adult: Secondary | ICD-10-CM | POA: Insufficient documentation

## 2016-06-27 DIAGNOSIS — G4733 Obstructive sleep apnea (adult) (pediatric): Secondary | ICD-10-CM | POA: Diagnosis not present

## 2016-07-04 ENCOUNTER — Ambulatory Visit: Payer: Self-pay | Admitting: Podiatry

## 2016-07-08 ENCOUNTER — Encounter: Payer: Self-pay | Admitting: Interventional Cardiology

## 2016-07-09 NOTE — Telephone Encounter (Signed)
This patient is intolerant of Pradaxa. His insurance plan does not sufficiently cover Eliquis. Is there a way to determine if his policy covers Xarelto or Savaysa?  He refuses coumadin.  If there is nothing we can do, he should stop Pradaxa , resume aspirin and accept higher risk of CVA.

## 2016-07-10 ENCOUNTER — Telehealth: Payer: Self-pay | Admitting: Pharmacist

## 2016-07-10 ENCOUNTER — Telehealth: Payer: Self-pay | Admitting: *Deleted

## 2016-07-10 NOTE — Telephone Encounter (Signed)
LMTCB in regards to anticoagulation.

## 2016-07-10 NOTE — Telephone Encounter (Signed)
Xarelto is 20 mg daily. Aspirin should be DC when Xarelto started.

## 2016-07-10 NOTE — Telephone Encounter (Signed)
Called pt insurance and they state that Xarelto will be $10 more per month and Eliquis will be $20 more per month than Pradaxa.   Pt does have commercial insurance and should qualify for copay card.

## 2016-07-10 NOTE — Telephone Encounter (Signed)
Called and spoke with pt's wife, DPR on file. She states insurance only covers Pradaxa ($90/mo), Xarelto ($120/mo) and Eliquis ($150/mo).  Advised wife of information provided by Dr. Tamala Julian in pt advice request and educated on higher risk of CVA with just ASA.  Wife states she will talk to pt and call us back on what he would like to take.  Advised I will send message to Dr. Tamala Julian for advisement on dose and instructions for Xarelto and ASA so we have those once pt makes a decision on what he would like to do.  Wife appreciative for assistance.

## 2016-07-14 NOTE — Telephone Encounter (Signed)
LMOM again for pt to return call to discuss switching anticoagulants.

## 2016-07-15 MED ORDER — APIXABAN 5 MG PO TABS: 5.0000 mg | ORAL_TABLET | Freq: Two times a day (BID) | ORAL | 1 refills | Status: DC

## 2016-07-15 NOTE — Telephone Encounter (Signed)
Pt wife called to report that pt is stopping Pradaxa due to stomach issues. Pt would prefer to go on Eliquis as Dr. Tamala Julian originally prescribed. 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 She requests copay card be placed in mail. He will start Eliquis 5mg  BID - sent to pharmacy of choice. Pt's wife states understanding and appreciation.

## 2016-07-15 NOTE — Telephone Encounter (Signed)
LMOM to follow up 

## 2016-07-16 NOTE — Telephone Encounter (Signed)
See phone note from Pharmacist on 07/10/16.  Pt decided to go back on Eliquis.

## 2016-07-28 ENCOUNTER — Other Ambulatory Visit: Payer: Self-pay | Admitting: *Deleted

## 2016-07-28 MED ORDER — HYDROCHLOROTHIAZIDE 12.5 MG PO CAPS: 12.5000 mg | ORAL_CAPSULE | Freq: Every day | ORAL | 3 refills | Status: DC

## 2016-08-05 ENCOUNTER — Other Ambulatory Visit: Payer: Self-pay | Admitting: Internal Medicine

## 2016-08-05 ENCOUNTER — Telehealth: Payer: Self-pay | Admitting: Radiology

## 2016-08-05 DIAGNOSIS — E119 Type 2 diabetes mellitus without complications: Secondary | ICD-10-CM

## 2016-08-05 DIAGNOSIS — I10 Essential (primary) hypertension: Secondary | ICD-10-CM

## 2016-08-05 DIAGNOSIS — G4730 Sleep apnea, unspecified: Secondary | ICD-10-CM

## 2016-08-05 DIAGNOSIS — I4819 Other persistent atrial fibrillation: Secondary | ICD-10-CM

## 2016-08-05 NOTE — Telephone Encounter (Signed)
Orders placed for labs

## 2016-08-05 NOTE — Progress Notes (Signed)
Orders placed for labs

## 2016-08-05 NOTE — Telephone Encounter (Signed)
Pt coming for labs tomorrow, please place orders. Thank you.  

## 2016-08-06 ENCOUNTER — Other Ambulatory Visit (INDEPENDENT_AMBULATORY_CARE_PROVIDER_SITE_OTHER): Payer: BLUE CROSS/BLUE SHIELD

## 2016-08-06 DIAGNOSIS — E119 Type 2 diabetes mellitus without complications: Secondary | ICD-10-CM | POA: Diagnosis not present

## 2016-08-06 DIAGNOSIS — I10 Essential (primary) hypertension: Secondary | ICD-10-CM | POA: Diagnosis not present

## 2016-08-06 LAB — CBC WITH DIFFERENTIAL/PLATELET
Basophils Absolute: 0.1 10*3/uL (ref 0.0–0.1)
Basophils Relative: 1 % (ref 0.0–3.0)
Eosinophils Absolute: 0.1 10*3/uL (ref 0.0–0.7)
Eosinophils Relative: 1.6 % (ref 0.0–5.0)
HCT: 46.3 % (ref 39.0–52.0)
Hemoglobin: 15.9 g/dL (ref 13.0–17.0)
Lymphocytes Relative: 37.7 % (ref 12.0–46.0)
Lymphs Abs: 2.1 10*3/uL (ref 0.7–4.0)
MCHC: 34.4 g/dL (ref 30.0–36.0)
MCV: 92.2 fl (ref 78.0–100.0)
Monocytes Absolute: 0.6 10*3/uL (ref 0.1–1.0)
Monocytes Relative: 10.2 % (ref 3.0–12.0)
Neutro Abs: 2.7 10*3/uL (ref 1.4–7.7)
Neutrophils Relative %: 49.5 % (ref 43.0–77.0)
Platelets: 172 10*3/uL (ref 150.0–400.0)
RBC: 5.02 Mil/uL (ref 4.22–5.81)
RDW: 13.5 % (ref 11.5–15.5)
WBC: 5.5 10*3/uL (ref 4.0–10.5)

## 2016-08-06 LAB — LIPID PANEL
Cholesterol: 111 mg/dL (ref 0–200)
HDL: 31.9 mg/dL — ABNORMAL LOW (ref >39.00)
LDL Cholesterol: 59 mg/dL (ref 0–99)
NonHDL: 79.27
Total CHOL/HDL Ratio: 3
Triglycerides: 99 mg/dL (ref 0.0–149.0)
VLDL: 19.8 mg/dL (ref 0.0–40.0)

## 2016-08-06 LAB — HEPATIC FUNCTION PANEL
ALT: 26 U/L (ref 0–53)
AST: 15 U/L (ref 0–37)
Albumin: 4.5 g/dL (ref 3.5–5.2)
Alkaline Phosphatase: 74 U/L (ref 39–117)
Bilirubin, Direct: 0.1 mg/dL (ref 0.0–0.3)
Total Bilirubin: 0.5 mg/dL (ref 0.2–1.2)
Total Protein: 6.5 g/dL (ref 6.0–8.3)

## 2016-08-06 LAB — BASIC METABOLIC PANEL
BUN: 12 mg/dL (ref 6–23)
CO2: 29 mEq/L (ref 19–32)
Calcium: 9.2 mg/dL (ref 8.4–10.5)
Chloride: 107 mEq/L (ref 96–112)
Creatinine, Ser: 1.08 mg/dL (ref 0.40–1.50)
GFR: 76.65 mL/min (ref >60.00)
Glucose, Bld: 104 mg/dL — ABNORMAL HIGH (ref 70–99)
Potassium: 4.8 mEq/L (ref 3.5–5.1)
Sodium: 141 mEq/L (ref 135–145)

## 2016-08-06 LAB — MICROALBUMIN / CREATININE URINE RATIO
Creatinine,U: 108.7 mg/dL
Microalb Creat Ratio: 0.7 mg/g (ref 0.0–30.0)
Microalb, Ur: 0.8 mg/dL (ref 0.0–1.9)

## 2016-08-06 LAB — TSH: TSH: 1.57 u[IU]/mL (ref 0.35–4.50)

## 2016-08-06 LAB — HEMOGLOBIN A1C: Hgb A1c MFr Bld: 6 % (ref 4.6–6.5)

## 2016-08-07 ENCOUNTER — Encounter: Payer: Self-pay | Admitting: Internal Medicine

## 2016-08-08 ENCOUNTER — Ambulatory Visit: Payer: BLUE CROSS/BLUE SHIELD | Admitting: Internal Medicine

## 2016-08-17 ENCOUNTER — Other Ambulatory Visit: Payer: Self-pay | Admitting: Internal Medicine

## 2016-09-15 ENCOUNTER — Other Ambulatory Visit: Payer: Self-pay | Admitting: Internal Medicine

## 2016-09-19 ENCOUNTER — Ambulatory Visit (INDEPENDENT_AMBULATORY_CARE_PROVIDER_SITE_OTHER): Payer: BLUE CROSS/BLUE SHIELD | Admitting: Internal Medicine

## 2016-09-19 ENCOUNTER — Encounter: Payer: Self-pay | Admitting: Internal Medicine

## 2016-09-19 VITALS — BP 140/84 | HR 68 | Temp 98.7°F | Resp 16 | Ht 73.0 in | Wt 274.8 lb

## 2016-09-19 DIAGNOSIS — I350 Nonrheumatic aortic (valve) stenosis: Secondary | ICD-10-CM | POA: Diagnosis not present

## 2016-09-19 DIAGNOSIS — I10 Essential (primary) hypertension: Secondary | ICD-10-CM

## 2016-09-19 DIAGNOSIS — I2510 Atherosclerotic heart disease of native coronary artery without angina pectoris: Secondary | ICD-10-CM | POA: Diagnosis not present

## 2016-09-19 DIAGNOSIS — E119 Type 2 diabetes mellitus without complications: Secondary | ICD-10-CM | POA: Diagnosis not present

## 2016-09-19 DIAGNOSIS — E785 Hyperlipidemia, unspecified: Secondary | ICD-10-CM | POA: Diagnosis not present

## 2016-09-19 DIAGNOSIS — I471 Supraventricular tachycardia: Secondary | ICD-10-CM

## 2016-09-19 DIAGNOSIS — R7989 Other specified abnormal findings of blood chemistry: Secondary | ICD-10-CM

## 2016-09-19 DIAGNOSIS — G4730 Sleep apnea, unspecified: Secondary | ICD-10-CM

## 2016-09-19 DIAGNOSIS — E291 Testicular hypofunction: Secondary | ICD-10-CM | POA: Diagnosis not present

## 2016-09-19 DIAGNOSIS — I481 Persistent atrial fibrillation: Secondary | ICD-10-CM | POA: Diagnosis not present

## 2016-09-19 DIAGNOSIS — I4819 Other persistent atrial fibrillation: Secondary | ICD-10-CM

## 2016-09-19 NOTE — Progress Notes (Signed)
Patient ID: Dalton [name], male   DOB: 1965-07-14, 51 y.o.   MRN: [id number]   Subjective:    Patient ID: Dalton [name], male    DOB: 05-Nov-1965, 51 y.o.   MRN: [id number]  HPI  Patient here for a scheduled follow up.  He is accompanied by his wife.  History obtained by both of them.  He has gained weight.  Reports increased fatigue. Using cpap.  Still snores.  States he is still watching his diet.  Reports easy to get irritated.  Feels a lot of his symptoms are related to decreased testosterone.  Was seeing urology in [place].  Was on testosterone replacement.  Off now.  States last time level checked, did not feel needed. Wants testosterone level rechecked. Discussed seeing urology.  No chest pain.  Seeing cardiology.  Was placed on pradaxa.  Did not tolerate.  Does not want take eliquis.  Discussed with him today.  Breathing stable.     Past Medical History:  Diagnosis Date  . Aortic valve stenosis    a. Bicuspid AV - 2D Echo 06/2014 - mod AS, mild AI, mildly dilated aortic root.  [name] CAD (coronary artery disease)   . Carotid artery disease ([place])    a. Mild by duplex 05/2015 - 1-39% BICA. Repeat due 05/2017.  . Dilated aortic root ([place])    a. By echo 06/2014.  . Environmental allergies   . Hypercholesterolemia   . Hypertension   . SVT (supraventricular tachycardia) ([place])    a. h/o SVT - Ablation done 2013.   Past Surgical History:  Procedure Laterality Date  . CARDIAC ELECTROPHYSIOLOGY STUDY AND ABLATION  08/21/2011  . SEPTOPLASTY N/A 03/22/2015   Procedure: SEPTOPLASTY  WITH RIGHT INFERIOR TURBINATE REDUCTION;  Surgeon: Margaretha Sheffield, MD;  Location: [facility];  Service: ENT;  Laterality: N/A;  . SUPRAVENTRICULAR TACHYCARDIA ABLATION N/A 08/21/2011   Procedure: SUPRAVENTRICULAR TACHYCARDIA ABLATION;  Surgeon: Evans Lance, MD;  Location: [facility] CATH LAB;  Service: Cardiovascular;  Laterality: N/A;  . surgery for non descending testicle    . TONSILLECTOMY     Family  History  Problem Relation Age of Onset  . Diabetes    . Cancer    . Breast cancer Maternal Grandmother   . Cancer Maternal Grandfather   . Aortic stenosis Maternal Grandfather    Social History   Social History  . Marital status: Married    Spouse name: N/A  . Number of children: N/A  . 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 Smoking status: Former Smoker    Types: Cigarettes    Quit date: 11/04/1988  . Smokeless tobacco: Never Used  . Alcohol use 4.8 oz/week    7 Glasses of wine, 1 Shots of liquor per week     Comment: [place]  . Drug use: Yes     Comment: many years ago in highschool  . Sexual activity: Not Currently   Other Topics Concern  . None   Social History Narrative   Very rare exercise    Outpatient Encounter Prescriptions as of 09/19/2016  Medication Sig  . apixaban (ELIQUIS) 5 MG TABS tablet Take 1 tablet (5 mg total) by mouth 2 (two) times daily.  [name] aspirin 81 MG tablet Take 81 mg by mouth daily.  [name] atorvastatin (LIPITOR) 40 MG tablet Take 1 tablet (40 mg total) by mouth daily.  [name]  cetirizine (ZYRTEC) 10 MG tablet Take by mouth.  . fluticasone (FLONASE) 50 MCG/ACT nasal spray PLACE 2 SPRAYS INTO BOTH NOSTRILS DAILY  . fluticasone (FLONASE) 50 MCG/ACT nasal spray PLACE 2 SPRAYS INTO BOTH NOSTRILS DAILY  . hydrochlorothiazide (MICROZIDE) 12.5 MG capsule Take 1 capsule (12.5 mg total) by mouth daily.  [name] lisinopril (PRINIVIL,ZESTRIL) 20 MG tablet TAKE ONE TABLET BY MOUTH ONCE DAILY  . metoprolol succinate (TOPROL-XL) 25 MG 24 hr tablet Take 1 tablet (25 mg total) by mouth daily.  Glory Rosebush DELICA LANCETS 82C MISC USE TWICE DAILY AS DIRECTED  . ONETOUCH VERIO test strip USE TWICE DAILY AS DIRECTED   No facility-administered encounter medications on file as of 09/19/2016.     Review of Systems  Constitutional: Positive for fatigue. Negative for appetite  change.       Has gained weight.    HENT: Negative for congestion and sinus pressure.   Respiratory: Negative for cough, chest tightness and shortness of breath.   Cardiovascular: Negative for chest pain, palpitations and leg swelling.  Gastrointestinal: Negative for abdominal pain, diarrhea, nausea and vomiting.  Genitourinary: Negative for difficulty urinating and dysuria.  Musculoskeletal: Negative for joint swelling and myalgias.  Skin: Negative for color change and rash.  Neurological: Negative for dizziness, light-headedness and headaches.  Psychiatric/Behavioral: Negative for dysphoric mood.       Increased stress.  Financial stress.         Objective:     Blood pressure rechecked by me:  138/88  Physical Exam  Constitutional: He appears well-developed and well-nourished. No distress.  HENT:  Nose: Nose normal.  Mouth/Throat: Oropharynx is clear and moist.  Neck: Neck supple. No thyromegaly present.  Cardiovascular: Normal rate and regular rhythm.   Pulmonary/Chest: Effort normal and breath sounds normal. No respiratory distress.  Abdominal: Soft. Bowel sounds are normal. There is no tenderness.  Musculoskeletal: He exhibits no edema or tenderness.  Lymphadenopathy:    He has no cervical adenopathy.  Skin: No rash noted. No erythema.  Psychiatric: He has a normal mood and affect. His behavior is normal.    BP 140/84 (BP Location: Left Arm, Patient Position: Sitting, Cuff Size: Large)   Pulse 68   Temp 98.7 F (37.1 C) (Oral)   Resp 16   Ht 6' 1"  (1.854 m)   Wt 274 lb 12.8 oz (124.6 kg)   SpO2 96%   BMI 36.26 kg/m  Wt Readings from Last 3 Encounters:  09/19/16 274 lb 12.8 oz (124.6 kg)  06/23/16 269 lb 12.8 oz (122.4 kg)  05/23/16 263 lb (119.3 kg)     Lab Results  Component Value Date   WBC 5.5 08/06/2016   HGB 15.9 08/06/2016   HCT 46.3 08/06/2016   PLT 172.0 08/06/2016   GLUCOSE 104 (H) 08/06/2016   CHOL 111 08/06/2016   TRIG 99.0 08/06/2016   HDL  31.90 (L) 08/06/2016   LDLCALC 59 08/06/2016   ALT 26 08/06/2016   AST 15 08/06/2016   NA 141 08/06/2016   K 4.8 08/06/2016   CL 107 08/06/2016   CREATININE 1.08 08/06/2016   BUN 12 08/06/2016   CO2 29 08/06/2016   TSH 1.57 08/06/2016   PSA 1.03 11/09/2015   INR 1.0 08/14/2011   HGBA1C 6.0 08/06/2016   MICROALBUR 0.8 08/06/2016       Assessment & Plan:   Problem List Items Addressed This Visit    Aortic valve stenosis    Followed by cardiology.  See last note.  Discussed atrial fib and anticoagulation.  He declines at this time.  Continue f/u with cardiology.       CAD (coronary artery disease)    Continue f/u with cardiology.        Diabetes mellitus (HCC)    Last a1c 6.0.  Has gained weight.  States still watching diet and exercise.  Follow met b and a1c.        Relevant Orders   Hemoglobin A1c   Essential hypertension    Blood pressure slightly increased today.  Recheck as outlined.  Same medication regimen.  Follow pressures.  Decreased sodium intake.  Follow.        Relevant Orders   Basic metabolic panel   Hyperlipidemia    Low cholesterol diet and exercise.  Follow lipid panel.        Relevant Orders   Hepatic function panel   Lipid panel   Low testosterone level in male    Has a history of low testosterone.  He feels a lot of his symptoms are related to low testosterone.  Request to have testosterone checked.  Discussed urology referral.  He agrees.  Refer to Dr Jacqlyn Larsen.  Discussed increased stress, etc.  Discussed further intervention for this.  He declines.  Follow.        Persistent atrial fibrillation (HCC)    On metoprolol now.  Declines anticoagulation.  Follow.        Sleep apnea    Uses cpap.  Had recheck to confirm settings ok.  Still does not feel he is sleeping well.  Discussed referral to sleep specialist.  He declines at this time.  Follow.  Continue cpap.       SVT (supraventricular tachycardia) (HCC)    s/p ablation.  Doing well.   Seeing cardiology.        Other Visit Diagnoses    Low testosterone in male    -  Primary   Relevant Orders   Testos,Total,Free and SHBG (Male)       Einar Pheasant, MD

## 2016-09-19 NOTE — Progress Notes (Signed)
Pre-visit discussion using our clinic review tool. No additional management support is needed unless otherwise documented below in the visit note.  

## 2016-09-21 ENCOUNTER — Encounter: Payer: Self-pay | Admitting: Internal Medicine

## 2016-09-21 DIAGNOSIS — R7989 Other specified abnormal findings of blood chemistry: Secondary | ICD-10-CM | POA: Insufficient documentation

## 2016-09-21 NOTE — Assessment & Plan Note (Signed)
Has a history of low testosterone.  He feels a lot of his symptoms are related to low testosterone.  Request to have testosterone checked.  Discussed urology referral.  He agrees.  Refer to Dr Jacqlyn Larsen.  Discussed increased stress, etc.  Discussed further intervention for this.  He declines.  Follow.

## 2016-09-21 NOTE — Assessment & Plan Note (Signed)
Followed by cardiology.  See last note.  Discussed atrial fib and anticoagulation.  He declines at this time.  Continue f/u with cardiology.

## 2016-09-21 NOTE — Assessment & Plan Note (Signed)
On metoprolol now.  Declines anticoagulation.  Follow.

## 2016-09-21 NOTE — Assessment & Plan Note (Signed)
Blood pressure slightly increased today.  Recheck as outlined.  Same medication regimen.  Follow pressures.  Decreased sodium intake.  Follow.

## 2016-09-21 NOTE — Assessment & Plan Note (Signed)
s/p ablation.  Doing well.  Seeing cardiology.

## 2016-09-21 NOTE — Assessment & Plan Note (Signed)
Low cholesterol diet and exercise.  Follow lipid panel.   

## 2016-09-21 NOTE — Assessment & Plan Note (Addendum)
Uses cpap.  Had recheck to confirm settings ok.  Still does not feel he is sleeping well.  Discussed referral to sleep specialist.  He declines at this time.  Follow.  Continue cpap.

## 2016-09-21 NOTE — Assessment & Plan Note (Signed)
Continue f/u with cardiology.  

## 2016-09-21 NOTE — Assessment & Plan Note (Signed)
Last a1c 6.0.  Has gained weight.  States still watching diet and exercise.  Follow met b and a1c.

## 2016-09-23 LAB — TESTOS,TOTAL,FREE AND SHBG (FEMALE)
Sex Hormone Binding Glob.: 32 nmol/L (ref 10–50)
Testosterone, Free: 43.4 pg/mL (ref 35.0–155.0)
Testosterone,Total,LC/MS/MS: 339 ng/dL (ref 250–1100)

## 2016-09-24 ENCOUNTER — Encounter: Payer: Self-pay | Admitting: Internal Medicine

## 2016-09-24 ENCOUNTER — Encounter: Payer: Self-pay | Admitting: Interventional Cardiology

## 2016-11-24 ENCOUNTER — Other Ambulatory Visit: Payer: Self-pay | Admitting: Internal Medicine

## 2016-11-26 ENCOUNTER — Encounter: Payer: Self-pay | Admitting: Internal Medicine

## 2016-11-26 DIAGNOSIS — Z1211 Encounter for screening for malignant neoplasm of colon: Secondary | ICD-10-CM

## 2016-11-27 NOTE — Telephone Encounter (Signed)
I do not mind placing order for referral, just need a little more information.  Need to know when had last colonoscopy and how long they told him would need to be screened again.  Did he have polyps?

## 2016-12-04 NOTE — Telephone Encounter (Signed)
Order placed for GI referral.   

## 2016-12-08 ENCOUNTER — Other Ambulatory Visit: Payer: Self-pay | Admitting: Internal Medicine

## 2016-12-24 ENCOUNTER — Encounter: Payer: Self-pay | Admitting: Interventional Cardiology

## 2016-12-26 ENCOUNTER — Other Ambulatory Visit: Payer: BLUE CROSS/BLUE SHIELD

## 2017-01-02 ENCOUNTER — Ambulatory Visit: Payer: BLUE CROSS/BLUE SHIELD | Admitting: Internal Medicine

## 2017-01-02 ENCOUNTER — Other Ambulatory Visit (INDEPENDENT_AMBULATORY_CARE_PROVIDER_SITE_OTHER): Payer: BLUE CROSS/BLUE SHIELD

## 2017-01-02 DIAGNOSIS — E119 Type 2 diabetes mellitus without complications: Secondary | ICD-10-CM | POA: Diagnosis not present

## 2017-01-02 DIAGNOSIS — I10 Essential (primary) hypertension: Secondary | ICD-10-CM | POA: Diagnosis not present

## 2017-01-02 DIAGNOSIS — E785 Hyperlipidemia, unspecified: Secondary | ICD-10-CM | POA: Diagnosis not present

## 2017-01-02 LAB — HEPATIC FUNCTION PANEL
ALT: 37 U/L (ref 0–53)
AST: 20 U/L (ref 0–37)
Albumin: 4.3 g/dL (ref 3.5–5.2)
Alkaline Phosphatase: 76 U/L (ref 39–117)
Bilirubin, Direct: 0.1 mg/dL (ref 0.0–0.3)
Total Bilirubin: 0.7 mg/dL (ref 0.2–1.2)
Total Protein: 6.8 g/dL (ref 6.0–8.3)

## 2017-01-02 LAB — LIPID PANEL
Cholesterol: 150 mg/dL (ref 0–200)
HDL: 35.5 mg/dL — ABNORMAL LOW (ref >39.00)
LDL Cholesterol: 88 mg/dL (ref 0–99)
NonHDL: 114.88
Total CHOL/HDL Ratio: 4
Triglycerides: 133 mg/dL (ref 0.0–149.0)
VLDL: 26.6 mg/dL (ref 0.0–40.0)

## 2017-01-02 LAB — BASIC METABOLIC PANEL
BUN: 15 mg/dL (ref 6–23)
CO2: 30 mEq/L (ref 19–32)
Calcium: 9.6 mg/dL (ref 8.4–10.5)
Chloride: 104 mEq/L (ref 96–112)
Creatinine, Ser: 1.12 mg/dL (ref 0.40–1.50)
GFR: 73.38 mL/min (ref >60.00)
Glucose, Bld: 147 mg/dL — ABNORMAL HIGH (ref 70–99)
Potassium: 4.5 mEq/L (ref 3.5–5.1)
Sodium: 141 mEq/L (ref 135–145)

## 2017-01-02 LAB — HEMOGLOBIN A1C: Hgb A1c MFr Bld: 6.9 % — ABNORMAL HIGH (ref 4.6–6.5)

## 2017-01-04 ENCOUNTER — Encounter: Payer: Self-pay | Admitting: Internal Medicine

## 2017-01-06 NOTE — Telephone Encounter (Signed)
Hard copy mailed  

## 2017-01-08 NOTE — Progress Notes (Addendum)
Cardiology Office Note    Date:  01/09/2017   ID:  Dalton [name], DOB [birth year], MRN [id number]  PCP:  Einar Pheasant, MD  Cardiologist: Sinclair Grooms, MD   Chief Complaint  Patient presents with  . Atrial Fibrillation  . Cardiac Valve Problem    History of Present Illness:  Dalton [name] is a 51 y.o. male with history of biscupid aortic valve with AS, prior SVT s/p RF ablation, HTN, HLD who presents for follow-up. Last echo 06/2014: mod LVH, EF 60-65%, grade 1 DD, severely calcified AV leaflets (bicuspid by prior report, poorly visualized on this study), moderate aortic stenosis with mild AI, mildly dilated ascending aorta - based upon 06/2016 echocardiogram.  Dalton [name] denies chest pain, orthopnea, dizziness, syncope, and edema. He has no exertional complaints. There is no orthopnea or PND. He has not had palpitations, transient neurological complaints, or bleeding complications on anticoagulation.  No good evidence for CAD. H/O CA calcification on chest CT.  AF with CHADS VASC = 2.  Past Medical History:  Diagnosis Date  . Aortic valve stenosis    a. Bicuspid AV - 2D Echo 06/2014 - mod AS, mild AI, mildly dilated aortic root.  [name] CAD (coronary artery disease)   . Carotid artery disease ([place])    a. Mild by duplex 05/2015 - 1-39% BICA. Repeat due 05/2017.  . Dilated aortic root ([place])    a. By echo 06/2014.  . Environmental allergies   . Hypercholesterolemia   . Hypertension   . SVT (supraventricular tachycardia) ([place])    a. h/o SVT - Ablation done 2013.    Past Surgical History:  Procedure Laterality Date  . CARDIAC ELECTROPHYSIOLOGY STUDY AND ABLATION  08/21/2011  . SEPTOPLASTY N/A 03/22/2015   Procedure: SEPTOPLASTY  WITH RIGHT INFERIOR TURBINATE REDUCTION;  Surgeon: Margaretha Sheffield, MD;  Location: [facility];  Service: ENT;  Laterality: N/A;  . SUPRAVENTRICULAR TACHYCARDIA ABLATION N/A 08/21/2011   Procedure: SUPRAVENTRICULAR TACHYCARDIA ABLATION;   Surgeon: Evans Lance, MD;  Location[facility] C[facility] LAB;  Service: Cardiovascular;  Laterality: N/A;  . surgery for non descending testicle    . TONSILLECTOMY      Current Medications: Outpatient Medications Prior to Visit  Medication Sig Dispense Refill  . apixaban (ELIQUIS) 5 MG TABS tablet Take 1 tablet (5 mg total) by mouth 2 (two) times daily. 180 tablet 1  . aspirin 81 MG tablet Take 81 mg by mouth daily.    [name] atorvastatin (LIPITOR) 40 MG tablet Take 1 tablet (40 mg total) by mouth daily. 30 tablet 5  . cetirizine (ZYRTEC) 10 MG tablet Take by mouth.    . fluticasone (FLONASE) 50 MCG/ACT nasal spray PLACE 2 SPRAYS INTO BOTH NOSTRILS DAILY 16 g 2  . hydrochlorothiazide (MICROZIDE) 12.5 MG capsule Take 1 capsule (12.5 mg total) by mouth daily. 90 capsule 3  . lisinopril (PRINIVIL,ZESTRIL) 20 MG tablet TAKE ONE TABLET BY MOUTH ONCE DAILY 30 tablet 11  . metoprolol succinate (TOPROL-XL) 25 MG 24 hr tablet Take 1 tablet (25 mg total) by mouth daily. 90 tablet 3  . ONETOUCH DELICA LANCETS 27P MISC USE TWICE DAILY AS DIRECTED 200 each 3  . ONETOUCH VERIO test strip USE TWICE DAILY AS DIRECTED 200 each 3  . fluticasone (FLONASE) 50 MCG/ACT nasal spray PLACE 2 SPRAYS INTO BOTH NOSTRILS DAILY (Patient not taking: Reported on 01/09/2017) 16 g 2   No facility-administered medications prior to visit.      Allergies:  Tomato   Social History   Social History  . Marital status: Married    Spouse name: N/A  . Number of children: N/A  . 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 Smoking status: Former Smoker    Types: Cigarettes    Quit date: 11/04/1988  . Smokeless tobacco: Never Used  . Alcohol use 4.8 oz/week    7 Glasses of wine, 1 Shots of liquor per week     Comment: [place]  . Drug use: Yes     Comment: many years ago in highschool  . Sexual activity: Not Currently   Other Topics  Concern  . None   Social History Narrative   Very rare exercise     Family History:  The patient's family history includes Aortic stenosis in his maternal grandfather; Breast cancer in his maternal grandmother; Cancer in his maternal grandfather.   ROS:   Please see the history of present illness.    Unexplained weight gain. States that his appetite has decreased.  All other systems reviewed and are negative.   PHYSICAL EXAM:   VS:  BP 132/86   Pulse 79   Ht 6\' 1"  (1.854 m)   Wt 286 lb 9.6 oz (130 kg)   SpO2 96%   BMI 37.81 kg/m    GEN: Well nourished, well developed, in no acute distress  HEENT: normal  Neck: no JVD, carotid bruits, or masses Cardiac: IIRR; 2 to 3/6 crescendo decrescendo systolic murmur and no significant regurgitation is audible. He has no rubs, or gallops,no edema.  Respiratory:  clear to auscultation bilaterally, normal work of breathing GI: soft, nontender, nondistended, + BS MS: no deformity or atrophy  Skin: warm and dry, no rash Neuro:  Alert and Oriented x 3, Strength and sensation are intact Psych: euthymic mood, full affect  Wt Readings from Last 3 Encounters:  01/09/17 286 lb 9.6 oz (130 kg)  09/19/16 274 lb 12.8 oz (124.6 kg)  06/23/16 269 lb 12.8 oz (122.4 kg)      Studies/Labs Reviewed:   EKG:  EKG  Normal sinus rhythm, atrial fibrillation with controlled rate, and when compared to prior no significant change has occurred.  Recent Labs: 08/06/2016: Hemoglobin 15.9; Platelets 172.0; TSH 1.57 01/02/2017: ALT 37; BUN 15; Creatinine, Ser 1.12; Potassium 4.5; Sodium 141   Lipid Panel    Component Value Date/Time   CHOL 150 01/02/2017 0759   TRIG 133.0 01/02/2017 0759   HDL 35.50 (L) 01/02/2017 0759   CHOLHDL 4 01/02/2017 0759   VLDL 26.6 01/02/2017 0759   LDLCALC 88 01/02/2017 0759    Additional studies/ records that were reviewed today include:  Echocardiogram 06/09/2017: Study Conclusions  - Left ventricle: The cavity size was  normal. Systolic function was   normal. The estimated ejection fraction was in the range of 50%   to 55%. Although no diagnostic regional wall motion abnormality   was identified, this possibility cannot be completely excluded on   the basis of this study. - Aortic valve: Bicuspid; severely thickened, severely calcified   leaflets; fusion of the right-noncoronary commissure. Valve   mobility was restricted. There was mild stenosis. There was   moderate regurgitation. Peak velocity (S): 235 cm/s. Mean   gradient (S): 11 mm Hg. (76mmHg previously. Current measurement   likely represents an understimation of stenosis). Valve area   (VTI): 1.48 cm^2. Valve area (  Vmax): 1.34 cm^2. Valve area   (Vmean): 1.31 cm^2. - Aorta: Aortic root dimension: 38 mm (ED). - Ascending aorta: The ascending aorta was mildly dilated. - Left atrium: The atrium was mildly dilated. - Right ventricle: The cavity size was moderately dilated. Wall   thickness was normal. - Right atrium: The atrium was moderately dilated.    ASSESSMENT:    1. Nonrheumatic aortic valve stenosis   2. Essential hypertension   3. Coronary artery disease involving native coronary artery of native heart without angina pectoris   4. Persistent atrial fibrillation ([place])   5. SVT (supraventricular tachycardia) ([place])   6. Sleep apnea, unspecified type      PLAN:  In order of problems listed above:  1. Congenitally bicuspid aortic valve with moderate dysfunction currently. No symptoms. Continue to follow. Last echo December 2017. Plan repeat echo in approximately 2 years from the last study. 2. Good control of blood pressure. 3. Has coronary calcification noted on CT. No documentation of obstructive disease. 4. Chronic atrial fibrillation with controlled ventricular response. No complications. 5. No bleeding complications on Apixaban therapy. Continue indefinitely due to chronic atrial fibrillation. 6. Not addressed  Clinical  follow-up in 6 months. Next echo in approximately 18 months.  Medication Adjustments/Labs and Tests Ordered: Current medicines are reviewed at length with the patient today.  Concerns regarding medicines are outlined above.  Medication changes, Labs and Tests ordered today are listed in the Patient Instructions below. Patient Instructions  Medication Instructions:  None  Labwork: None  Testing/Procedures: None  Follow-Up: Your physician wants you to follow-up in: January 2019 with Dr. Tamala Julian.  You will receive a reminder letter in the mail two months in advance. If you don't receive a letter, please call our office to schedule the follow-up appointment.   Any Other Special Instructions Will Be Listed Below (If Applicable).     If you need a refill on your cardiac medications before your next appointment, please call your pharmacy.      Signed, Sinclair Grooms, MD  01/09/2017 8:11 AM    [address], [place], [place]  [address] Phone: [phone]; Fax: [phone]

## 2017-01-09 ENCOUNTER — Encounter: Payer: Self-pay | Admitting: *Deleted

## 2017-01-09 ENCOUNTER — Encounter: Payer: Self-pay | Admitting: Interventional Cardiology

## 2017-01-09 ENCOUNTER — Ambulatory Visit (INDEPENDENT_AMBULATORY_CARE_PROVIDER_SITE_OTHER): Payer: BLUE CROSS/BLUE SHIELD | Admitting: Interventional Cardiology

## 2017-01-09 VITALS — BP 132/86 | HR 79 | Ht 73.0 in | Wt 286.6 lb

## 2017-01-09 DIAGNOSIS — G4730 Sleep apnea, unspecified: Secondary | ICD-10-CM | POA: Diagnosis not present

## 2017-01-09 DIAGNOSIS — I481 Persistent atrial fibrillation: Secondary | ICD-10-CM

## 2017-01-09 DIAGNOSIS — I10 Essential (primary) hypertension: Secondary | ICD-10-CM | POA: Diagnosis not present

## 2017-01-09 DIAGNOSIS — I2510 Atherosclerotic heart disease of native coronary artery without angina pectoris: Secondary | ICD-10-CM | POA: Diagnosis not present

## 2017-01-09 DIAGNOSIS — I350 Nonrheumatic aortic (valve) stenosis: Secondary | ICD-10-CM

## 2017-01-09 DIAGNOSIS — I4819 Other persistent atrial fibrillation: Secondary | ICD-10-CM

## 2017-01-09 DIAGNOSIS — Z7901 Long term (current) use of anticoagulants: Secondary | ICD-10-CM

## 2017-01-09 NOTE — Patient Instructions (Signed)
Medication Instructions:  None  Labwork: None  Testing/Procedures: None  Follow-Up: Your physician wants you to follow-up in: January 2019 with Dr. Tamala Julian.  You will receive a reminder letter in the mail two months in advance. If you don't receive a letter, please call our office to schedule the follow-up appointment.   Any Other Special Instructions Will Be Listed Below (If Applicable).     If you need a refill on your cardiac medications before your next appointment, please call your pharmacy.

## 2017-01-20 ENCOUNTER — Other Ambulatory Visit: Payer: Self-pay | Admitting: Internal Medicine

## 2017-01-23 ENCOUNTER — Ambulatory Visit: Payer: BLUE CROSS/BLUE SHIELD | Admitting: Internal Medicine

## 2017-01-28 ENCOUNTER — Encounter: Payer: Self-pay | Admitting: Internal Medicine

## 2017-02-11 ENCOUNTER — Encounter: Payer: Self-pay | Admitting: Internal Medicine

## 2017-02-11 DIAGNOSIS — G4730 Sleep apnea, unspecified: Secondary | ICD-10-CM

## 2017-02-11 DIAGNOSIS — G479 Sleep disorder, unspecified: Secondary | ICD-10-CM

## 2017-02-11 NOTE — Telephone Encounter (Signed)
Please call pt.  Last visit, he reported that he was not sleeping well despite his cpap.  Make sure he is using cpap regularly.  Also, see if he is still having issues with sleeping.  If so, we had discussed seeing a sleep specialist.  If agreeable, let me know.

## 2017-02-17 NOTE — Telephone Encounter (Signed)
Order placed for neurology referral.   

## 2017-02-18 ENCOUNTER — Other Ambulatory Visit: Payer: Self-pay | Admitting: Interventional Cardiology

## 2017-02-18 NOTE — Telephone Encounter (Signed)
Age 51 years WT 130 KG  01/09/2017  Saw Dr Tamala Julian 01/09/2017  01/02/2017 SrCr 1.12 08/06/2016  Hgb 15.9 HCT 46.3  Refill done for Eliquis 5mg  q 12 hours as requested

## 2017-02-19 NOTE — Telephone Encounter (Signed)
Melissa, is there anyway you can help me with this.  This pt just had a sleep study in 05/2017.  His insurance will not cover another and he is not able to pay for it out of pocket.  Is there anyway she would see him without doing another sleep study?  Thanks    Dr Nicki Reaper

## 2017-02-20 NOTE — Telephone Encounter (Signed)
Dr. Sabino Dick (if that is how you spell her name) denied his referral because they can not do another sleep study and per the message that was left for me they need to do another sleep study before they can see the patient.  I can refer him to Dr. Manuella Ghazi or [facility] if you would like.

## 2017-02-20 NOTE — Telephone Encounter (Signed)
Since they are requiring you to have another sleep study, would you be able to go to [facility].  There is another physician I use there for sleep issues.  Just let me know.  Thanks

## 2017-04-13 ENCOUNTER — Telehealth: Payer: Self-pay | Admitting: Interventional Cardiology

## 2017-04-13 NOTE — Telephone Encounter (Signed)
This encounter was created in error - please disregard.

## 2017-04-13 NOTE — Telephone Encounter (Signed)
New message    1. What dental office are you calling from? Dr. Wilkie Aye   2. What is your office phone and fax number? Phone-[phone] [phone]   3. What type of procedure is the patient having performed? Surgical extraction   4. What date is procedure scheduled or is the patient there now? Oct 22nd   5. What is your question (ex. Antibiotics prior to procedure, holding medication-we need to know how long dentist wants pt to hold med)? Hold asa 7-10 days prior

## 2017-04-15 NOTE — Telephone Encounter (Signed)
   [facility] Pre-operative Risk Assessment    Request for surgical clearance:  1. What type of surgery is being performed? Surgical Extraction   2. When is this surgery scheduled? 10.22.2018   3. Are there any medications that need to be held prior to surgery and how long?Asprin 325 for 7-10 days prior to surgery.   4. Practice name and name of physician performing surgery? [facility] DMD,MS,[facility]   5. What is your office phone and fax number? Phone [phone] Fax [phone]  6. Anesthesia type (None, local, MAC, general) ? N/A   Shadow Stiggers R 04/15/2017, 10:44 AM  _________________________________________________________________   (provider comments below)

## 2017-04-17 ENCOUNTER — Telehealth: Payer: Self-pay | Admitting: Interventional Cardiology

## 2017-04-17 NOTE — Telephone Encounter (Signed)
Pharmacist (CVRR) dose not clear antiplatelets therapy. Route to APP/MD

## 2017-04-17 NOTE — Telephone Encounter (Signed)
° °  [facility] Pre-operative Risk Assessment    Request for surgical clearance:  1. What type of surgery is being performed? extraction  2. When is this surgery scheduled? ASAP, date not listed  3. Are there any medications that need to be held prior to surgery and how long? Aspirin, 7-10 days pre op and resuming it within 24 hours as long as bleeding is controlled.   4. Practice name and name of physician performing surgery? [facility], DMD, MS, [facility], [facility]  5. What is your office phone and fax number? PH# [phone], FAX# [phone]  6. Anesthesia type (None, local, MAC, general) ? Unspecified     _________________________________________________________________   (provider comments below) Doctor would like pt's most recent H&P faxed over.

## 2017-04-20 NOTE — Telephone Encounter (Signed)
   Chart reviewed as part of pre-operative protocol coverage. Received pre op clearance from dentist to hold ASA 325mg . However review of chart indicates patient is taking aspirin 81mg  qd and Eliquis 5 mg BID. Will routed back to call back pool to review current medications with dosage.   Kisha Messman, PA 04/20/2017, 1:32 PM

## 2017-04-20 NOTE — Telephone Encounter (Signed)
This is duplicate note of prop clearance.

## 2017-04-20 NOTE — Telephone Encounter (Signed)
Left message for patient to call back to review medications.

## 2017-04-21 NOTE — Telephone Encounter (Signed)
Follow up     Dalton [name] is calling from Dr. Wilkie Aye office about clearance. She said he is scheduled for Monday. And they need to know what to do. She is asking for a call back today. Please call.

## 2017-04-21 NOTE — Telephone Encounter (Signed)
Attempted to call patient to clarify which dose of ASA he is taking. There was no answer. Left message for patient to call back.

## 2017-04-21 NOTE — Telephone Encounter (Signed)
Called and spoke to [facility] at Dr. Silvano Rusk office and made her aware that we have made several attempts to reach out to the patient to clarify which medications that he is taking prior to giving clearance, since the clearance requests for the patient to hold ASA 325 for 7-10 days and according to our list the patient is taking ASA 81 mg QD and Eliquis 5 mg BID. Verdis Frederickson states that per the patient's wife the patient is only taking ASA 81 mg QD and is not taking Eliquis. Made Maria aware that per Melina Copa, PA, our protocol is to speak to the patient to clarify before we can give clearance. I also advised her that she can try to contact the patient as well to have him call our office. Verdis Frederickson verbalized understanding and states that she will make Dr. Wilkie Aye aware as well.

## 2017-04-23 NOTE — Telephone Encounter (Signed)
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 Per wise, Mr. Propes has never started Eliquis. He takes ASA 81mg  qd and other cardiac medication.  He has chronic chest pain and shortness of breath at baseline and it's stable per wife.   Will forward note  to Dr. Tamala Julian for clearance.   Leanor Kail, PA-C 04/23/2017 15:16

## 2017-04-23 NOTE — Telephone Encounter (Signed)
Please call the patient to clarify current medications.

## 2017-04-23 NOTE — Telephone Encounter (Signed)
lmtcb on both telephone numbers listed for patient.

## 2017-04-25 NOTE — Telephone Encounter (Signed)
THEY need to come in to discuss. He needs anticoagulation either coumadin or Eliquis because he is at risk of stroke.

## 2017-04-28 NOTE — Telephone Encounter (Signed)
Needs appt discuss-starting Eliquis Colonoscopy in December Discuss anticoagulation with wife she states that due to his job, pt refuses to take and anticoagulant because of the type of job that he does he is afraid of bleeding. Appt scheduled w/Dr Tamala Julian 06-19-17 to discuss, again.   Per Wife pt had dental procedure done yesterday w/Dr Wilkie Aye. But per wife, pt does have a colonoscopy scheduled with Dr Vira Agar on 06-26-17 and should need clearance for this also.

## 2017-05-04 NOTE — Telephone Encounter (Signed)
To be addressed with Dr. Tamala Julian at appointment 06/19/17. Note will be removed from the preop pool and sent to Dr. Tamala Julian for his information.  Richardson Dopp, PA-C    05/04/2017 4:20 PM

## 2017-05-09 ENCOUNTER — Encounter: Payer: Self-pay | Admitting: Interventional Cardiology

## 2017-06-19 ENCOUNTER — Ambulatory Visit: Payer: BLUE CROSS/BLUE SHIELD | Admitting: Interventional Cardiology

## 2017-06-25 ENCOUNTER — Encounter: Payer: Self-pay | Admitting: *Deleted

## 2017-06-26 ENCOUNTER — Encounter
Admission: RE | Disposition: A | Discharge status: Home / Self Care | Payer: Self-pay | Source: Ambulatory Visit | Attending: Unknown Physician Specialty

## 2017-06-26 ENCOUNTER — Ambulatory Visit: Payer: BLUE CROSS/BLUE SHIELD | Admitting: Anesthesiology

## 2017-06-26 ENCOUNTER — Ambulatory Visit
Admission: RE | Admit: 2017-06-26 | Discharge: 2017-06-26 | Disposition: A | Discharge status: Home / Self Care | Payer: BLUE CROSS/BLUE SHIELD | Source: Ambulatory Visit | Attending: Unknown Physician Specialty | Admitting: Unknown Physician Specialty

## 2017-06-26 DIAGNOSIS — E1151 Type 2 diabetes mellitus with diabetic peripheral angiopathy without gangrene: Secondary | ICD-10-CM | POA: Diagnosis not present

## 2017-06-26 DIAGNOSIS — Z6838 Body mass index (BMI) 38.0-38.9, adult: Secondary | ICD-10-CM | POA: Diagnosis not present

## 2017-06-26 DIAGNOSIS — K640 First degree hemorrhoids: Secondary | ICD-10-CM | POA: Diagnosis not present

## 2017-06-26 DIAGNOSIS — I77810 Thoracic aortic ectasia: Secondary | ICD-10-CM | POA: Insufficient documentation

## 2017-06-26 DIAGNOSIS — Z87891 Personal history of nicotine dependence: Secondary | ICD-10-CM | POA: Insufficient documentation

## 2017-06-26 DIAGNOSIS — D122 Benign neoplasm of ascending colon: Secondary | ICD-10-CM | POA: Insufficient documentation

## 2017-06-26 DIAGNOSIS — Z7984 Long term (current) use of oral hypoglycemic drugs: Secondary | ICD-10-CM | POA: Diagnosis not present

## 2017-06-26 DIAGNOSIS — I10 Essential (primary) hypertension: Secondary | ICD-10-CM | POA: Insufficient documentation

## 2017-06-26 DIAGNOSIS — Z7982 Long term (current) use of aspirin: Secondary | ICD-10-CM | POA: Insufficient documentation

## 2017-06-26 DIAGNOSIS — I2510 Atherosclerotic heart disease of native coronary artery without angina pectoris: Secondary | ICD-10-CM | POA: Insufficient documentation

## 2017-06-26 DIAGNOSIS — Z7951 Long term (current) use of inhaled steroids: Secondary | ICD-10-CM | POA: Insufficient documentation

## 2017-06-26 DIAGNOSIS — Z1211 Encounter for screening for malignant neoplasm of colon: Secondary | ICD-10-CM | POA: Diagnosis not present

## 2017-06-26 DIAGNOSIS — K30 Functional dyspepsia: Secondary | ICD-10-CM | POA: Insufficient documentation

## 2017-06-26 DIAGNOSIS — Z9989 Dependence on other enabling machines and devices: Secondary | ICD-10-CM | POA: Insufficient documentation

## 2017-06-26 DIAGNOSIS — I350 Nonrheumatic aortic (valve) stenosis: Secondary | ICD-10-CM | POA: Insufficient documentation

## 2017-06-26 DIAGNOSIS — Z79899 Other long term (current) drug therapy: Secondary | ICD-10-CM | POA: Insufficient documentation

## 2017-06-26 DIAGNOSIS — K3189 Other diseases of stomach and duodenum: Secondary | ICD-10-CM | POA: Diagnosis not present

## 2017-06-26 DIAGNOSIS — K635 Polyp of colon: Secondary | ICD-10-CM | POA: Insufficient documentation

## 2017-06-26 DIAGNOSIS — R109 Unspecified abdominal pain: Secondary | ICD-10-CM | POA: Diagnosis present

## 2017-06-26 DIAGNOSIS — E7800 Pure hypercholesterolemia, unspecified: Secondary | ICD-10-CM | POA: Diagnosis not present

## 2017-06-26 DIAGNOSIS — G4730 Sleep apnea, unspecified: Secondary | ICD-10-CM | POA: Diagnosis not present

## 2017-06-26 DIAGNOSIS — K766 Portal hypertension: Secondary | ICD-10-CM | POA: Insufficient documentation

## 2017-06-26 HISTORY — DX: Sleep apnea, unspecified: G47.30

## 2017-06-26 HISTORY — PX: COLONOSCOPY WITH PROPOFOL: SHX5780

## 2017-06-26 HISTORY — PX: ESOPHAGOGASTRODUODENOSCOPY (EGD) WITH PROPOFOL: SHX5813

## 2017-06-26 LAB — HM COLONOSCOPY

## 2017-06-26 SURGERY — ESOPHAGOGASTRODUODENOSCOPY (EGD) WITH PROPOFOL
Anesthesia: General

## 2017-06-26 MED ORDER — ONDANSETRON HCL 4 MG/2ML IJ SOLN: 4.0000 mg | Freq: Once | INTRAMUSCULAR | Status: DC | PRN

## 2017-06-26 MED ORDER — FENTANYL CITRATE (PF) 100 MCG/2ML IJ SOLN
INTRAMUSCULAR | Status: AC
  Filled 2017-06-26: qty 2

## 2017-06-26 MED ORDER — METOPROLOL TARTRATE 5 MG/5ML IV SOLN
INTRAVENOUS | Status: AC
  Filled 2017-06-26: qty 5

## 2017-06-26 MED ORDER — MIDAZOLAM HCL 2 MG/2ML IJ SOLN
INTRAMUSCULAR | Status: AC
  Filled 2017-06-26: qty 2

## 2017-06-26 MED ORDER — SODIUM CHLORIDE 0.9 % IV SOLN: INTRAVENOUS | Status: DC

## 2017-06-26 MED ORDER — LIDOCAINE HCL (PF) 2 % IJ SOLN
INTRAMUSCULAR | Status: DC | PRN
  Administered 2017-06-26: 100 mg

## 2017-06-26 MED ORDER — SODIUM CHLORIDE 0.9 % IV SOLN
INTRAVENOUS | Status: DC
  Administered 2017-06-26 (×2): via INTRAVENOUS

## 2017-06-26 MED ORDER — PROPOFOL 10 MG/ML IV BOLUS
INTRAVENOUS | Status: DC | PRN
  Administered 2017-06-26: 30 mg via INTRAVENOUS
  Administered 2017-06-26: 10 mg via INTRAVENOUS

## 2017-06-26 MED ORDER — PROPOFOL 500 MG/50ML IV EMUL
INTRAVENOUS | Status: DC | PRN
  Administered 2017-06-26: 75 ug/kg/min via INTRAVENOUS

## 2017-06-26 MED ORDER — FENTANYL CITRATE (PF) 100 MCG/2ML IJ SOLN
INTRAMUSCULAR | Status: DC | PRN
  Administered 2017-06-26 (×2): 50 ug via INTRAVENOUS

## 2017-06-26 MED ORDER — PROPOFOL 500 MG/50ML IV EMUL
INTRAVENOUS | Status: AC
  Filled 2017-06-26: qty 50

## 2017-06-26 MED ORDER — FENTANYL CITRATE (PF) 100 MCG/2ML IJ SOLN: 25.0000 ug | INTRAMUSCULAR | Status: DC | PRN

## 2017-06-26 MED ORDER — METOPROLOL TARTRATE 5 MG/5ML IV SOLN
INTRAVENOUS | Status: DC | PRN
  Administered 2017-06-26 (×2): 1 mg via INTRAVENOUS

## 2017-06-26 MED ORDER — MIDAZOLAM HCL 5 MG/5ML IJ SOLN
INTRAMUSCULAR | Status: DC | PRN
  Administered 2017-06-26: 2 mg via INTRAVENOUS

## 2017-06-26 NOTE — Anesthesia Preprocedure Evaluation (Signed)
Anesthesia Evaluation  Patient identified by MRN, date of birth, ID band Patient awake    Reviewed: Allergy & Precautions, NPO status , Patient's Chart, lab work & pertinent test results, reviewed documented beta blocker date and time   Airway Mallampati: III  TM Distance: >3 FB Neck ROM: Full    Dental  (+) Teeth Intact, Caps   Pulmonary sleep apnea and Continuous Positive Airway Pressure Ventilation , former smoker,    Pulmonary exam normal        Cardiovascular hypertension, Pt. on medications and Pt. on home beta blockers + CAD and + Peripheral Vascular Disease  Normal cardiovascular exam+ dysrhythmias Supra Ventricular Tachycardia + Valvular Problems/Murmurs AS      Neuro/Psych negative neurological ROS  negative psych ROS   GI/Hepatic Neg liver ROS,   Endo/Other  diabetes, Well Controlled, Type 2, Oral Hypoglycemic AgentsMorbid obesity  Renal/GU      Musculoskeletal   Abdominal (+) + obese,   Peds  Hematology   Anesthesia Other Findings   Reproductive/Obstetrics                             Anesthesia Physical  Anesthesia Plan  ASA: II  Anesthesia Plan: General   Post-op Pain Management:    Induction: Intravenous  PONV Risk Score and Plan:   Airway Management Planned: Nasal Cannula  Additional Equipment:   Intra-op Plan:   Post-operative Plan:   Informed Consent: I have reviewed the patients History and Physical, chart, labs and discussed the procedure including the risks, benefits and alternatives for the proposed anesthesia with the patient or authorized representative who has indicated his/her understanding and acceptance.     Plan Discussed with: CRNA and Surgeon  Anesthesia Plan Comments:         Anesthesia Quick Evaluation

## 2017-06-26 NOTE — Anesthesia Postprocedure Evaluation (Signed)
Anesthesia Post Note  Patient: Dalton [name]  Procedure(s) Performed: ESOPHAGOGASTRODUODENOSCOPY (EGD) WITH PROPOFOL (N/A ) COLONOSCOPY WITH PROPOFOL (N/A )  Patient location during evaluation: PACU Anesthesia Type: General Level of consciousness: awake and alert and oriented Pain management: pain level controlled Vital Signs Assessment: post-procedure vital signs reviewed and stable Respiratory status: spontaneous breathing Cardiovascular status: blood pressure returned to baseline Anesthetic complications: no     Last Vitals:  Vitals:   06/26/17 1030 06/26/17 1040  BP: 119/90 (!) 118/97  Pulse: 81 81  Resp: 16 16  Temp:    SpO2: 97% 97%    Last Pain:  Vitals:   06/26/17 1010  TempSrc: Tympanic                 Dayna Geurts

## 2017-06-26 NOTE — Op Note (Signed)
[facility] Patient Name: Dalton [name] Procedure Date: 06/26/2017 9:34 AM MRN: [id number] Account #: [id number] Date of Birth: 1965-07-23 Admit Type: Outpatient Age: 51 Room: [facility] ENDO ROOM 3 Gender: Male Note Status: Finalized Procedure:            Colonoscopy Indications:          Screening for colorectal malignant neoplasm Providers:            Manya Silvas, MD Referring MD:         Einar Pheasant, MD (Referring MD) Medicines:            Propofol per Anesthesia Complications:        No immediate complications. Procedure:            Pre-Anesthesia Assessment:                       - After reviewing the risks and benefits, the patient                        was deemed in satisfactory condition to undergo the                        procedure.                       After obtaining informed consent, the colonoscope was                        passed under direct vision. Throughout the procedure,                        the patient's blood pressure, pulse, and oxygen                        saturations were monitored continuously. The                        Colonoscope was introduced through the anus and                        advanced to the the cecum, identified by appendiceal                        orifice and ileocecal valve. The colonoscopy was                        performed without difficulty. The patient tolerated the                        procedure well. The quality of the bowel preparation                        was excellent. Findings:      A diminutive polyp was found in the ascending colon. The polyp was       sessile. The polyp was removed with a cold snare. Resection and       retrieval were complete.      Two sessile polyps were found in the recto-sigmoid colon. The polyps       were diminutive in size. These polyps were removed with a jumbo cold  forceps. Resection and retrieval were complete.      Internal  hemorrhoids were found during endoscopy. The hemorrhoids were       small and Grade I (internal hemorrhoids that do not prolapse).      The exam was otherwise without abnormality. Impression:           - One diminutive polyp in the ascending colon, removed                        with a cold snare. Resected and retrieved.                       - Two diminutive polyps at the recto-sigmoid colon,                        removed with a jumbo cold forceps. Resected and                        retrieved.                       - Internal hemorrhoids.                       - The examination was otherwise normal. Recommendation:       - Await pathology results. Manya Silvas, MD 06/26/2017 10:11:56 AM This report has been signed electronically. Number of Addenda: 0 Note Initiated On: 06/26/2017 9:34 AM Scope Withdrawal Time: 0 hours 10 minutes 6 seconds  Total Procedure Duration: 0 hours 14 minutes 58 seconds       [facility]

## 2017-06-26 NOTE — H&P (Signed)
Primary Care Physician:  Einar Pheasant, MD Primary Gastroenterologist:  Dr. Vira Agar  Pre-Procedure History & Physical: HPI:  Dalton [name] is a 51 y.o. male is here for an endoscopy and colonoscopy.   Past Medical History:  Diagnosis Date  . Aortic valve stenosis    a. Bicuspid AV - 2D Echo 06/2014 - mod AS, mild AI, mildly dilated aortic root.  [name] CAD (coronary artery disease)   . Carotid artery disease ([place])    a. Mild by duplex 05/2015 - 1-39% BICA. Repeat due 05/2017.  . Dilated aortic root ([place])    a. By echo 06/2014.  . Environmental allergies   . Hypercholesterolemia   . Hypertension   . Sleep apnea   . SVT (supraventricular tachycardia) ([place])    a. h/o SVT - Ablation done 2013.    Past Surgical History:  Procedure Laterality Date  . CARDIAC ELECTROPHYSIOLOGY STUDY AND ABLATION  08/21/2011  . SEPTOPLASTY N/A 03/22/2015   Procedure: SEPTOPLASTY  WITH RIGHT INFERIOR TURBINATE REDUCTION;  Surgeon: Margaretha Sheffield, MD;  Location: [facility];  Service: ENT;  Laterality: N/A;  . SUPRAVENTRICULAR TACHYCARDIA ABLATION N/A 08/21/2011   Procedure: SUPRAVENTRICULAR TACHYCARDIA ABLATION;  Surgeon: Evans Lance, MD;  Location: [facility] CATH LAB;  Service: Cardiovascular;  Laterality: N/A;  . surgery for non descending testicle    . TONSILLECTOMY      Prior to Admission medications   Medication Sig Start Date End Date Taking? Authorizing Provider  aspirin 81 MG tablet Take 81 mg by mouth daily.   Yes [provider]  atorvastatin (LIPITOR) 40 MG tablet TAKE 1 TABLET BY MOUTH EVERY DAY 01/20/17  Yes Einar Pheasant, MD  cetirizine (ZYRTEC) 10 MG tablet Take by mouth.   Yes [provider]  fluticasone (FLONASE) 50 MCG/ACT nasal spray PLACE 2 SPRAYS INTO BOTH NOSTRILS DAILY 11/24/16  Yes Einar Pheasant, MD  hydrochlorothiazide (MICROZIDE) 12.5 MG capsule Take 1 capsule (12.5 mg total) by mouth daily. 07/28/16  Yes [name], MD  lisinopril  (PRINIVIL,ZESTRIL) 20 MG tablet TAKE ONE TABLET BY MOUTH ONCE DAILY 07/06/15  Yes [name], MD  metoprolol succinate (TOPROL-XL) 25 MG 24 hr tablet Take 1 tablet (25 mg total) by mouth daily. 06/23/16  Yes [name], MD  [facility] VERIO test strip USE TWICE DAILY AS DIRECTED 12/08/16  Yes Einar Pheasant, MD  ELIQUIS 5 MG TABS tablet TAKE 1 TABLET BY MOUTH 2 TIMES DAILY Patient not taking: Reported on 06/26/2017 02/18/17   [name], MD  NON FORMULARY as directed. CPAP    [provider]  [place] LANCETS 67Y MISC USE TWICE DAILY AS DIRECTED Patient not taking: Reported on 06/26/2017 09/15/16   Einar Pheasant, MD    Allergies as of 04/07/2017 - Review Complete 01/09/2017  Allergen Reaction Noted  . Tomato Hives and Itching 06/03/2011    Family History  Problem Relation Age of Onset  . Diabetes Unknown   . Cancer Unknown   . Breast cancer Maternal Grandmother   . Cancer Maternal Grandfather   . Aortic stenosis Maternal Grandfather     Social History   Socioeconomic History  . Marital status: Married    Spouse name: Not on file  . Number of children: Not on file  . Years of education: Not on file  . Highest education level: Not on file  Social Needs  . Financial resource strain: Not on file  . Food insecurity - worry: Not on file  .  Food insecurity - inability: Not on file  . Transportation needs - medical: Not on file  . Transportation needs - non-medical: Not on file  Occupational History  . Occupation: truck Education administrator: Insurance account manager  Tobacco Use  . Smoking status: Former Smoker    Types: Cigarettes    Last attempt to quit: 11/04/1988    Years since quitting: 28.6  . Smokeless tobacco: Never Used  Substance and Sexual Activity  . Alcohol use: Yes    Alcohol/week: 4.8 oz    Types: 7 Glasses of wine, 1 Shots of liquor per week    Comment: none last 24 hrs  . Drug use: Yes    Comment: many years ago in highschool  . Sexual  activity: Not Currently  Other Topics Concern  . Not on file  Social History Narrative   Very rare exercise    Review of Systems: See HPI, otherwise negative ROS  Physical Exam: BP 133/81   Pulse 82   Temp (!) 96.8 F (36 C) (Tympanic)   Resp 16   Ht 6\' 1"  (1.854 m)   Wt 132 kg (291 lb)   SpO2 97%   BMI 38.39 kg/m  General:   Alert,  pleasant and cooperative in NAD Head:  Normocephalic and atraumatic. Neck:  Supple; no masses or thyromegaly. Lungs:  Clear throughout to auscultation.    Heart:  Regular rate and rhythm. Abdomen:  Soft, nontender and nondistended. Normal bowel sounds, without guarding, and without rebound.   Neurologic:  Alert and  oriented x4;  grossly normal neurologically.  Impression/Plan: Dalton [name] is here for an endoscopy and colonoscopy to be performed for screening colonoscopy and abdominal pain.  Risks, benefits, limitations, and alternatives regarding  endoscopy and colonoscopy have been reviewed with the patient.  Questions have been answered.  All parties agreeable.   Gaylyn Cheers, MD  06/26/2017, 9:37 AM

## 2017-06-26 NOTE — Op Note (Signed)
[facility] Gastroenterology Patient Name: Dalton [name] Procedure Date: 06/26/2017 9:37 AM MRN: [id number] Account #: [id number] Date of Birth: 07-19-1965 Admit Type: Outpatient Age: 51 Room: [facility] ENDO ROOM 3 Gender: Male Note Status: Finalized Procedure:            Upper GI endoscopy Indications:          Epigastric abdominal pain, Functional Dyspepsia Providers:            Manya Silvas, MD Referring MD:         Einar Pheasant, MD (Referring MD) Medicines:            Propofol per Anesthesia Complications:        No immediate complications. Procedure:            Pre-Anesthesia Assessment:                       - After reviewing the risks and benefits, the patient                        was deemed in satisfactory condition to undergo the                        procedure.                       After obtaining informed consent, the endoscope was                        passed under direct vision. Throughout the procedure,                        the patient's blood pressure, pulse, and oxygen                        saturations were monitored continuously. The Endoscope                        was introduced through the mouth, and advanced to the                        second part of duodenum. The upper GI endoscopy was                        accomplished without difficulty. The patient tolerated                        the procedure well. Findings:      The examined esophagus was normal. GEJ at 44cm.      Moderate, severe portal hypertensive gastropathy was found in the       gastric body.      The gastric antrum was normal.      The examined duodenum was normal. Impression:           - Normal esophagus.                       - Portal hypertensive gastropathy.                       - Normal antrum.                       -  Normal examined duodenum.                       - No specimens collected. Recommendation:       - Perform a colonoscopy as previously  scheduled. Manya Silvas, MD 06/26/2017 9:49:49 AM This report has been signed electronically. Number of Addenda: 0 Note Initiated On: 06/26/2017 9:37 AM      [facility]

## 2017-06-26 NOTE — Anesthesia Post-op Follow-up Note (Signed)
Anesthesia QCDR form completed.        

## 2017-06-26 NOTE — Transfer of Care (Signed)
Immediate Anesthesia Transfer of Care Note  Patient: Dalton [name]  Procedure(s) Performed: ESOPHAGOGASTRODUODENOSCOPY (EGD) WITH PROPOFOL (N/A ) COLONOSCOPY WITH PROPOFOL (N/A )  Patient Location: PACU  Anesthesia Type:General  Level of Consciousness: sedated  Airway & Oxygen Therapy: Patient Spontanous Breathing and Patient connected to nasal cannula oxygen  Post-op Assessment: Report given to RN and Post -op Vital signs reviewed and stable  Post vital signs: Reviewed and stable  Last Vitals:  Vitals:   06/26/17 0922 06/26/17 1010  BP: 133/81 122/90  Pulse: 82 89  Resp: 16 14  Temp: (!) 36 C (!) 36.1 C  SpO2: 97% 98%    Last Pain:  Vitals:   06/26/17 1010  TempSrc: Tympanic         Complications: No apparent anesthesia complications

## 2017-06-29 LAB — SURGICAL PATHOLOGY

## 2017-07-01 ENCOUNTER — Encounter: Payer: Self-pay | Admitting: Unknown Physician Specialty

## 2017-07-02 ENCOUNTER — Other Ambulatory Visit: Payer: Self-pay | Admitting: Internal Medicine

## 2017-07-05 ENCOUNTER — Encounter: Payer: Self-pay | Admitting: Internal Medicine

## 2017-07-05 DIAGNOSIS — Z860100 Personal history of colon polyps, unspecified: Secondary | ICD-10-CM | POA: Insufficient documentation

## 2017-07-31 ENCOUNTER — Other Ambulatory Visit: Payer: Self-pay | Admitting: Interventional Cardiology

## 2017-07-31 ENCOUNTER — Other Ambulatory Visit: Payer: Self-pay | Admitting: Internal Medicine

## 2017-08-01 ENCOUNTER — Other Ambulatory Visit: Payer: Self-pay | Admitting: Interventional Cardiology

## 2017-08-06 ENCOUNTER — Encounter: Payer: Self-pay | Admitting: Interventional Cardiology

## 2017-08-13 NOTE — Progress Notes (Signed)
Cardiology Office Note    Date:  08/14/2017   ID:  Dalton [name], DOB 06/17/1966, MRN [id number]  PCP:  Einar Pheasant, MD  Cardiologist: Sinclair Grooms, MD   Chief Complaint  Patient presents with  . Atrial Fibrillation  . Cardiac Valve Problem    History of Present Illness:  Dalton [name] is a 52 y.o. male history of biscupid aortic valve with AS, prior SVT s/p RF ablation, HTN, HLD who presents for follow-up. Last echo 06/2014: mod LVH, EF 60-65%, grade 1 DD, severely calcified AV leaflets (bicuspid by prior report, poorly visualized on this study), moderate aortic stenosis with mild AI, mildly dilated ascending aorta - repeat planned in 2 years.  Atrial fibrillation Eliquis because it causes excessive bleeding in his work that is distracting and not acceptable.  Denies orthopnea, PND, chest pain, and syncope no peripheral edema.  Feels tired all the time.  Easy to fall asleep.  Is compliant with CPAP.  Does not believe it is effectively controlling this problem.   Past Medical History:  Diagnosis Date  . Aortic valve stenosis    a. Bicuspid AV - 2D Echo 06/2014 - mod AS, mild AI, mildly dilated aortic root.  [name] CAD (coronary artery disease)   . Carotid artery disease ([place])    a. Mild by duplex 05/2015 - 1-39% BICA. Repeat due 05/2017.  . Dilated aortic root ([place])    a. By echo 06/2014.  . Environmental allergies   . Hypercholesterolemia   . Hypertension   . Sleep apnea   . SVT (supraventricular tachycardia) ([place])    a. h/o SVT - Ablation done 2013.    Past Surgical History:  Procedure Laterality Date  . CARDIAC ELECTROPHYSIOLOGY STUDY AND ABLATION  08/21/2011  . COLONOSCOPY WITH PROPOFOL N/A 06/26/2017   Procedure: COLONOSCOPY WITH PROPOFOL;  Surgeon: Manya Silvas, MD;  Location: [facility] ENDOSCOPY;  Service: Endoscopy;  Laterality: N/A;  . ESOPHAGOGASTRODUODENOSCOPY (EGD) WITH PROPOFOL N/A 06/26/2017   Procedure: ESOPHAGOGASTRODUODENOSCOPY (EGD) WITH  PROPOFOL;  Surgeon: Manya Silvas, MD;  Location: [facility] ENDOSCOPY;  Service: Endoscopy;  Laterality: N/A;  . SEPTOPLASTY N/A 03/22/2015   Procedure: SEPTOPLASTY  WITH RIGHT INFERIOR TURBINATE REDUCTION;  Surgeon: Margaretha Sheffield, MD;  Location: [facility];  Service: ENT;  Laterality: N/A;  . SUPRAVENTRICULAR TACHYCARDIA ABLATION N/A 08/21/2011   Procedure: SUPRAVENTRICULAR TACHYCARDIA ABLATION;  Surgeon: Evans Lance, MD;  Location: [facility] CATH LAB;  Service: Cardiovascular;  Laterality: N/A;  . surgery for non descending testicle    . TONSILLECTOMY      Current Medications: Outpatient Medications Prior to Visit  Medication Sig Dispense Refill  . aspirin 81 MG tablet Take 81 mg by mouth daily.    [name] atorvastatin (LIPITOR) 40 MG tablet TAKE 1 TABLET BY MOUTH EVERY DAY 30 tablet 5  . cetirizine (ZYRTEC) 10 MG tablet Take 10 mg by mouth daily.     . fluticasone (FLONASE) 50 MCG/ACT nasal spray Place 2 sprays into both nostrils daily as needed for allergies or rhinitis.    . hydrochlorothiazide (MICROZIDE) 12.5 MG capsule TAKE 1 CAPSULE (12.5 MG TOTAL) BY MOUTH DAILY. 90 capsule 0  . lisinopril (PRINIVIL,ZESTRIL) 20 MG tablet TAKE ONE TABLET BY MOUTH ONCE DAILY 30 tablet 11  . metoprolol succinate (TOPROL-XL) 25 MG 24 hr tablet TAKE 1 TABLET (25 MG TOTAL) BY MOUTH DAILY. 90 tablet 0  . NON FORMULARY as directed. CPAP    . ONETOUCH [place] LANCETS 85I MISC USE  TWICE DAILY AS DIRECTED 200 each 3  . ONETOUCH VERIO test strip USE TWICE DAILY AS DIRECTED 200 each 3  . ELIQUIS 5 MG TABS tablet TAKE 1 TABLET BY MOUTH 2 TIMES DAILY (Patient not taking: Reported on 08/14/2017) 180 tablet 1  . fluticasone (FLONASE) 50 MCG/ACT nasal spray SPRAY 2 SPRAYS INTO EACH NOSTRIL EVERY DAY (Patient not taking: Reported on 08/14/2017) 16 g 2   No facility-administered medications prior to visit.      Allergies:   Tomato   Social History   Socioeconomic History  . Marital status: Married    Spouse name: None    . Number of children: None  . Years of education: None  . Highest education level: None  Social Needs  . Financial resource strain: None  . Food insecurity - worry: None  . Food insecurity - inability: None  . Transportation needs - medical: None  . Transportation needs - non-medical: None  Occupational History  . Occupation: truck Education administrator: Insurance account manager  Tobacco Use  . Smoking status: Former Smoker    Types: Cigarettes    Last attempt to quit: 11/04/1988    Years since quitting: 28.7  . Smokeless tobacco: Never Used  Substance and Sexual Activity  . Alcohol use: Yes    Alcohol/week: 4.8 oz    Types: 7 Glasses of wine, 1 Shots of liquor per week    Comment: none last 24 hrs  . Drug use: Yes    Comment: many years ago in highschool  . Sexual activity: Not Currently  Other Topics Concern  . None  Social History Narrative   Very rare exercise     Family History:  The patient's family history includes Aortic stenosis in his maternal grandfather; Breast cancer in his maternal grandmother; Cancer in his maternal grandfather and unknown relative; Diabetes in his unknown relative.   ROS:   Please see the history of present illness.    He no longer has anyone managing sleep apnea.  Feels that he is not resting well despite wearing his device.  He has decided against anticoagulation therapy because of nuisance bleeding.  We discussed his greater than 3 %/year risk of embolic CVA in the setting of chronic atrial fibrillation.  He is willing to take the risk rather than the nuisance of bleeding.  This conversation occurred in the presence of his wife who also seems to understand significant trade-off.  Other complaints include cough, back pain, snoring, and fatigue. All other systems reviewed and are negative.   PHYSICAL EXAM:   VS:  BP 128/76   Pulse 85   Ht 6\' 1"  (1.854 m)   Wt 289 lb 6.4 oz (131.3 kg)   BMI 38.18 kg/m    GEN: Well nourished, well developed, in no  acute distress.  Morbidly obese. HEENT: normal  Neck: no JVD, carotid bruits, or masses Cardiac: Irregularly irregular RR.  There is a 2/6 crescendo decrescendo right upper sternal border systolic murmur.  No diastolic murmurs heard.  No rubs, or gallops,no edema. Respiratory:  clear to auscultation bilaterally, normal work of breathing GI: soft, nontender, nondistended, + BS MS: no deformity or atrophy  Skin: warm and dry, no rash Neuro:  Alert and Oriented x 3, Strength and sensation are intact Psych: euthymic mood, full affect  Wt Readings from Last 3 Encounters:  08/14/17 289 lb 6.4 oz (131.3 kg)  06/26/17 291 lb (132 kg)  01/09/17 286 lb 9.6 oz (130 kg)  Studies/Labs Reviewed:   EKG:  EKG the most recent tracing is from July 2018 and revealed atrial fibrillation with controlled ventricular response and slight increase in interventricular conduction duration.  No other abnormalities are noted and no change is noted when compared to November 2017.  Recent Labs: 01/02/2017: ALT 37; BUN 15; Creatinine, Ser 1.12; Potassium 4.5; Sodium 141   Lipid Panel    Component Value Date/Time   CHOL 150 01/02/2017 0759   TRIG 133.0 01/02/2017 0759   HDL 35.50 (L) 01/02/2017 0759   CHOLHDL 4 01/02/2017 0759   VLDL 26.6 01/02/2017 0759   LDLCALC 88 01/02/2017 0759    Additional studies/ records that were reviewed today include:  No new cardiac functional data is available.  Echocardiogram performed June 09, 2016: Study Conclusions   - Left ventricle: The cavity size was normal. Systolic function was   normal. The estimated ejection fraction was in the range of 50%   to 55%. Although no diagnostic regional wall motion abnormality   was identified, this possibility cannot be completely excluded on   the basis of this study. - Aortic valve: Bicuspid; severely thickened, severely calcified   leaflets; fusion of the right-noncoronary commissure. Valve   mobility was restricted.  There was mild stenosis. There was   moderate regurgitation. Peak velocity (S): 235 cm/s. Mean   gradient (S): 11 mm Hg. (43mmHg previously. Current measurement   likely represents an understimation of stenosis). Valve area   (VTI): 1.48 cm^2. Valve area (Vmax): 1.34 cm^2. Valve area   (Vmean): 1.31 cm^2. - Aorta: Aortic root dimension: 38 mm (ED). - Ascending aorta: The ascending aorta was mildly dilated. - Left atrium: The atrium was mildly dilated. - Right ventricle: The cavity size was moderately dilated. Wall   thickness was normal. - Right atrium: The atrium was moderately dilated.   ASSESSMENT:    1. Nonrheumatic aortic valve stenosis   2. Essential hypertension   3. Persistent atrial fibrillation ([place])   4. Sleep apnea, unspecified type   5. Chronic anticoagulation   6. Hyperlipidemia, unspecified hyperlipidemia type   7. Coronary artery disease of native artery of native heart with stable angina pectoris ([place])      PLAN:  In order of problems listed above:  1. Exam is clinically unchanged.  No significant interval development of aortic regurgitation.  Last echo assessment of aortic stenosis severity was less than moderate.  Plan 2D Doppler echocardiogram between now and when he returns in 1 year. 2. Blood pressure control is adequate.  Weight loss and salt restrictions are discussed. 3. Elevated risk for embolic CVA, CAD CHADS VASC is greater than 2 (hypertension, glucose intolerance).  The patient has decided against long-term anticoagulation therapy.  He is on aspirin daily.  We discussed the magnitude of the elevated risk placing him at 2-3% risk of stroke per year compared to less than 1% risk on anticoagulation. 4. Will have patient see Dr. Shelva Majestic sleep management.  Since therapy for sleep apnea began patient feels better but still has excessive daytime sleepiness. 5. Patient has chosen to forego chronic anticoagulation. 6. Not addressed 7. There is no  documented evidence of coronary artery disease and he certainly has no symptoms of angina.  We will search for details concerning this and if none is found in Care Everywhere, will remove from his problem list.    Clinical follow-up in 1 year. He will have a 2D Doppler echocardiogram done before the next visit.  He will be  referred for sleep management by Dr. Corky Downs.  Medication Adjustments/Labs and Tests Ordered: Current medicines are reviewed at length with the patient today.  Concerns regarding medicines are outlined above.  Medication changes, Labs and Tests ordered today are listed in the Patient Instructions below. Patient Instructions  Medication Instructions:  Your physician recommends that you continue on your current medications as directed. Please refer to the Current Medication list given to you today.  Labwork: None  Testing/Procedures: Your physician has requested that you have an echocardiogram before you see Dr. Tamala Julian back in one year. Echocardiography is a painless test that uses sound waves to create images of your heart. It provides your doctor with information about the size and shape of your heart and how well your heart's chambers and valves are working. This procedure takes approximately one hour. There are no restrictions for this procedure.    Follow-Up: Your physician recommends that you schedule a follow-up appointment in: the next 6 months with Dr. Claiborne Billings to follow up on your sleep apnea.  Your physician wants you to follow-up in: 1 year with Dr. Tamala Julian.  You will receive a reminder letter in the mail two months in advance. If you don't receive a letter, please call our office to schedule the follow-up appointment.    Any Other Special Instructions Will Be Listed Below (If Applicable).     If you need a refill on your cardiac medications before your next appointment, please call your pharmacy.      Signed, Sinclair Grooms, MD  08/14/2017 12:43 PM     [facility] [place], [place],   [address] Phone: [phone]; Fax: [phone]

## 2017-08-14 ENCOUNTER — Encounter: Payer: Self-pay | Admitting: Interventional Cardiology

## 2017-08-14 ENCOUNTER — Ambulatory Visit (INDEPENDENT_AMBULATORY_CARE_PROVIDER_SITE_OTHER): Payer: Managed Care, Other (non HMO) | Admitting: Interventional Cardiology

## 2017-08-14 ENCOUNTER — Encounter: Payer: Self-pay | Admitting: *Deleted

## 2017-08-14 VITALS — BP 128/76 | HR 85 | Ht 73.0 in | Wt 289.4 lb

## 2017-08-14 DIAGNOSIS — G4730 Sleep apnea, unspecified: Secondary | ICD-10-CM | POA: Diagnosis not present

## 2017-08-14 DIAGNOSIS — I25118 Atherosclerotic heart disease of native coronary artery with other forms of angina pectoris: Secondary | ICD-10-CM

## 2017-08-14 DIAGNOSIS — E785 Hyperlipidemia, unspecified: Secondary | ICD-10-CM | POA: Diagnosis not present

## 2017-08-14 DIAGNOSIS — I350 Nonrheumatic aortic (valve) stenosis: Secondary | ICD-10-CM

## 2017-08-14 DIAGNOSIS — I4819 Other persistent atrial fibrillation: Secondary | ICD-10-CM

## 2017-08-14 DIAGNOSIS — I10 Essential (primary) hypertension: Secondary | ICD-10-CM

## 2017-08-14 DIAGNOSIS — Z7901 Long term (current) use of anticoagulants: Secondary | ICD-10-CM

## 2017-08-14 DIAGNOSIS — I481 Persistent atrial fibrillation: Secondary | ICD-10-CM | POA: Diagnosis not present

## 2017-08-14 NOTE — Patient Instructions (Addendum)
Medication Instructions:  Your physician recommends that you continue on your current medications as directed. Please refer to the Current Medication list given to you today.  Labwork: None  Testing/Procedures: Your physician has requested that you have an echocardiogram before you see Dr. Tamala Julian back in one year. Echocardiography is a painless test that uses sound waves to create images of your heart. It provides your doctor with information about the size and shape of your heart and how well your heart's chambers and valves are working. This procedure takes approximately one hour. There are no restrictions for this procedure.    Follow-Up: Your physician recommends that you schedule a follow-up appointment in: the next 6 months with Dr. Claiborne Billings to follow up on your sleep apnea.  Your physician wants you to follow-up in: 1 year with Dr. Tamala Julian.  You will receive a reminder letter in the mail two months in advance. If you don't receive a letter, please call our office to schedule the follow-up appointment.    Any Other Special Instructions Will Be Listed Below (If Applicable).     If you need a refill on your cardiac medications before your next appointment, please call your pharmacy.

## 2017-10-02 ENCOUNTER — Other Ambulatory Visit: Payer: Self-pay | Admitting: Internal Medicine

## 2017-10-22 ENCOUNTER — Other Ambulatory Visit: Payer: Self-pay | Admitting: Interventional Cardiology

## 2017-10-26 ENCOUNTER — Other Ambulatory Visit: Payer: Self-pay | Admitting: Interventional Cardiology

## 2017-10-31 ENCOUNTER — Other Ambulatory Visit: Payer: Self-pay | Admitting: Interventional Cardiology

## 2017-10-31 ENCOUNTER — Other Ambulatory Visit: Payer: Self-pay | Admitting: Internal Medicine

## 2017-11-18 ENCOUNTER — Telehealth: Payer: Self-pay

## 2017-11-18 NOTE — Telephone Encounter (Signed)
Copied from [place] [id number]. Topic: General - Call Back - No Documentation >> Nov 18, 2017  2:42 PM Sheaffer-Poudrier, Lydia Guiles wrote: Reason for CRM: Called patient and lm on vm . Patient sent a request to have his medical record copied and picked up. We must send to [facility] to process. Office wants pt to know there will be a fee. We do not known what the cost will be per page, ($1 to $3 ?) Will not send out request until patient ok'd this. Please tell patient he will receive a bill .  Thanks  >> Nov 18, 2017  3:34 PM Percell Belt A wrote: Pt called back in, gave him info and he ok to send request and expressed understanding there will be a bill sent and a cost per page

## 2017-11-18 NOTE — Telephone Encounter (Signed)
I think this should've went to you

## 2017-12-22 ENCOUNTER — Other Ambulatory Visit: Payer: Self-pay | Admitting: Internal Medicine

## 2017-12-26 ENCOUNTER — Encounter: Payer: Self-pay | Admitting: Internal Medicine

## 2018-01-27 ENCOUNTER — Telehealth: Payer: Self-pay

## 2018-01-27 NOTE — Telephone Encounter (Signed)
Copied from [place] [phone]. Topic: Referral - Request >> Jan 27, 2018  1:16 PM Selinda Flavin B, NT wrote: Reason for CRM: Patient's wife, Butch Penny, calling and is requesting that Dr Nicki Reaper refer the patient to an orthopedic doctor of her choice. Prefers it not to be in [place]. States they need an orthopedic doctor to be able to get things started with the [place]. Please advise. CB#: [phone]

## 2018-01-28 NOTE — Telephone Encounter (Signed)
Spoke with patients wife regarding referral. She stated that patient is needing referral to ortho for neck, shoulder, back pain. This is not for an acute issue. He was injured in the service. She stated that the [place] is requiring him be seen by a MD prior to them treating him. Advised that patient may need OV with you before referral could be put in. Patients wife stated that she tried to go ahead and do so but they were told that he could not be seen until November. I advised that we could see him before then. Wife stated that due to work he can only come in on Fridays.

## 2018-01-28 NOTE — Telephone Encounter (Signed)
Do they have a preference of which orthopedist to see.  I can place order for referral at their request and see if will see without me seeing.  This may save two trips.   Just need to know if preference.

## 2018-02-01 ENCOUNTER — Other Ambulatory Visit: Payer: Self-pay | Admitting: Internal Medicine

## 2018-02-01 DIAGNOSIS — M549 Dorsalgia, unspecified: Secondary | ICD-10-CM

## 2018-02-01 NOTE — Progress Notes (Signed)
Order placed for ortho referral.   

## 2018-02-01 NOTE — Telephone Encounter (Signed)
Order placed for ortho referral.   

## 2018-02-01 NOTE — Telephone Encounter (Signed)
Patient would like to be referred to someone in town. They are ok with whoever you recommend.

## 2018-02-21 ENCOUNTER — Encounter: Payer: Self-pay | Admitting: Internal Medicine

## 2018-02-24 NOTE — Telephone Encounter (Signed)
The last a1c that I have is 12/2016 and it was 6.9.  This is c/w diabetes.  I have not seen him in a while and I have no labs for over one year.

## 2018-03-05 ENCOUNTER — Other Ambulatory Visit: Payer: Self-pay | Admitting: Internal Medicine

## 2018-03-09 ENCOUNTER — Other Ambulatory Visit: Payer: Self-pay

## 2018-04-01 ENCOUNTER — Telehealth: Payer: Self-pay | Admitting: Interventional Cardiology

## 2018-04-01 NOTE — Telephone Encounter (Signed)
New Message          Patient's wife is calling today because patient stop breathing on Sunday 03/28/2018. She states that he was cold and claming and not breathing.Pls call and advise.

## 2018-04-01 NOTE — Telephone Encounter (Signed)
He needs a 2D Doppler echocardiogram to follow-up on aortic stenosis/regurgitation prior to coming in to see me.  He may need to be re-evaluated by his sleep physician to make sure that his current treatment settings are appropriate.  Make an appointment for him to see me approximately 1 week after his echo.
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
## 2018-04-01 NOTE — Telephone Encounter (Signed)
Spoke with wife and she placed me on speaker phone so pt could hear as well.  Went over recommendations per Dr. Tamala Julian. Pt's appts have to be on Friday due to work.  Scheduled echo for 10/4 and scheduled pt to see Dr. Tamala Julian on 10/18 as he is not in the office on 10/11.  Pt and wife verbalized understanding and were in agreement with this plan.

## 2018-04-09 ENCOUNTER — Ambulatory Visit (HOSPITAL_COMMUNITY): Payer: Managed Care, Other (non HMO) | Attending: Cardiology

## 2018-04-09 ENCOUNTER — Other Ambulatory Visit: Payer: Self-pay

## 2018-04-09 DIAGNOSIS — I352 Nonrheumatic aortic (valve) stenosis with insufficiency: Secondary | ICD-10-CM | POA: Diagnosis not present

## 2018-04-09 DIAGNOSIS — I350 Nonrheumatic aortic (valve) stenosis: Secondary | ICD-10-CM | POA: Diagnosis present

## 2018-04-09 DIAGNOSIS — E785 Hyperlipidemia, unspecified: Secondary | ICD-10-CM | POA: Diagnosis not present

## 2018-04-09 DIAGNOSIS — I4891 Unspecified atrial fibrillation: Secondary | ICD-10-CM | POA: Insufficient documentation

## 2018-04-09 DIAGNOSIS — G4730 Sleep apnea, unspecified: Secondary | ICD-10-CM | POA: Diagnosis not present

## 2018-04-09 DIAGNOSIS — I119 Hypertensive heart disease without heart failure: Secondary | ICD-10-CM | POA: Insufficient documentation

## 2018-04-09 DIAGNOSIS — I2510 Atherosclerotic heart disease of native coronary artery without angina pectoris: Secondary | ICD-10-CM | POA: Insufficient documentation

## 2018-04-09 MED ORDER — PERFLUTREN LIPID MICROSPHERE
1.0000 mL | INTRAVENOUS | Status: AC | PRN
  Administered 2018-04-09: 2 mL via INTRAVENOUS
  Administered 2018-04-09: 3 mL via INTRAVENOUS

## 2018-04-14 ENCOUNTER — Telehealth: Payer: Self-pay | Admitting: *Deleted

## 2018-04-14 NOTE — Telephone Encounter (Signed)
-----   Message from [name], MD sent at 04/14/2018 12:35 PM EDT ----- Let the patient know the echo remains relatively stable.  Valve is not progressed.  We will discuss further at office visit. A copy will be sent to Einar Pheasant, MD

## 2018-04-14 NOTE — Telephone Encounter (Signed)
Left message to go over echo results. 

## 2018-04-21 NOTE — Progress Notes (Signed)
Cardiology Office Note:    Date:  04/23/2018   ID:  Dalton [name], DOB [birth year], MRN [id number]  PCP:  Einar Pheasant, MD  Cardiologist:  No primary care provider on file.   Referring MD: Einar Pheasant, MD   Chief Complaint  Patient presents with  . Atrial Fibrillation  . Cardiac Valve Problem    History of Present Illness:    Dalton [name] is a 52 y.o. male with a hx of history of biscupid aortic valve with AS, prior SVT s/p RF ablation, HTN, HLD who presents for follow-up. Last echo 06/2014: mod LVH, EF 60-65%, grade 1 DD, severely calcified AV leaflets (bicuspid by prior report, poorly visualized on this study), moderate aortic stenosis with mild AI, mildly dilated ascending aorta - repeat planned in 2 years.   He is doing okay.  His wife is with him with concerns concerning sleep apnea.  About a month ago she states she was awakened and noticed that he was not breathing, was cold and clammy.  She had to shake him to arouse.  She then became concerned that his sleep apnea equipment was not properly functioning.  His equipment was monitored and supposedly if he had adequate ventilation is occurring, the company will contact them.  They never heard anything.  She called them when they reviewed the data, they noted that he did have "episodes".  They recommend follow-up with his sleep physician to get updated prescription for sleep.  Unfortunately, he does not have a sleep physician and nothing has happened.  He denies orthopnea, PND, and chest pain.  He denies syncope.  He has chronic atrial fibrillation and is at high risk for embolic stroke: CHADS VASC is 3 (diastolic heart failure/left atrial enlargement; diabetes mellitus; hypertension).  We had an extended conversation concerning anticoagulation and the benefits for prophylaxis against embolic stroke.  He and his wife have been concerned about hemorrhaging without a reversal agent.   Past Medical History:  Diagnosis  Date  . Aortic valve stenosis    a. Bicuspid AV - 2D Echo 06/2014 - mod AS, mild AI, mildly dilated aortic root.  [name] CAD (coronary artery disease)   . Carotid artery disease ([place])    a. Mild by duplex 05/2015 - 1-39% BICA. Repeat due 05/2017.  . Dilated aortic root ([place])    a. By echo 06/2014.  . Environmental allergies   . Hypercholesterolemia   . Hypertension   . Sleep apnea   . SVT (supraventricular tachycardia) ([place])    a. h/o SVT - Ablation done 2013.    Past Surgical History:  Procedure Laterality Date  . CARDIAC ELECTROPHYSIOLOGY STUDY AND ABLATION  08/21/2011  . COLONOSCOPY WITH PROPOFOL N/A 06/26/2017   Procedure: COLONOSCOPY WITH PROPOFOL;  Surgeon: Manya Silvas, MD;  Location: [facility] ENDOSCOPY;  Service: Endoscopy;  Laterality: N/A;  . ESOPHAGOGASTRODUODENOSCOPY (EGD) WITH PROPOFOL N/A 06/26/2017   Procedure: ESOPHAGOGASTRODUODENOSCOPY (EGD) WITH PROPOFOL;  Surgeon: Manya Silvas, MD;  Location: [facility] ENDOSCOPY;  Service: Endoscopy;  Laterality: N/A;  . SEPTOPLASTY N/A 03/22/2015   Procedure: SEPTOPLASTY  WITH RIGHT INFERIOR TURBINATE REDUCTION;  Surgeon: Margaretha Sheffield, MD;  Location: [place];  Service: ENT;  Laterality: N/A;  . SUPRAVENTRICULAR TACHYCARDIA ABLATION N/A 08/21/2011   Procedure: SUPRAVENTRICULAR TACHYCARDIA ABLATION;  Surgeon: Evans Lance, MD;  Location: [facility] CATH LAB;  Service: Cardiovascular;  Laterality: N/A;  . surgery for non descending testicle    . TONSILLECTOMY      Current Medications: Current  Meds  Medication Sig  . aspirin 81 MG tablet Take 81 mg by mouth daily.  [name] atorvastatin (LIPITOR) 40 MG tablet TAKE 1 TABLET BY MOUTH EVERY DAY  . cetirizine (ZYRTEC) 10 MG tablet Take 10 mg by mouth daily.   . fluticasone (FLONASE) 50 MCG/ACT nasal spray Place 2 sprays into both nostrils daily as needed for allergies or rhinitis.  . hydrochlorothiazide (MICROZIDE) 12.5 MG capsule TAKE 1 CAPSULE (12.5 MG TOTAL) BY MOUTH DAILY.  [name] lisinopril  (PRINIVIL,ZESTRIL) 20 MG tablet TAKE ONE TABLET BY MOUTH ONCE DAILY  . metoprolol succinate (TOPROL-XL) 25 MG 24 hr tablet Take 1 tablet (25 mg total) by mouth daily.  . NON FORMULARY as directed. CPAP  . ONETOUCH DELICA LANCETS 95K MISC USE TWICE DAILY AS DIRECTED  . ONETOUCH VERIO test strip USE TWICE DAILY AS DIRECTED     Allergies:   Tomato   Social History   Socioeconomic History  . Marital status: Married    Spouse name: Not on file  . Number of children: Not on file  . Years of education: Not on file  . Highest education level: Not on file  Occupational History  . Occupation: truck Education administrator: Insurance account manager  Social Needs  . Financial resource strain: Not on file  . Food insecurity:    Worry: Not on file    Inability: Not on file  . Transportation needs:    Medical: Not on file    Non-medical: Not on file  Tobacco Use  . Smoking status: Former Smoker    Types: Cigarettes    Last attempt to quit: 11/04/1988    Years since quitting: 29.4  . Smokeless tobacco: Never Used  Substance and Sexual Activity  . Alcohol use: Yes    Alcohol/week: 8.0 standard drinks    Types: 7 Glasses of wine, 1 Shots of liquor per week    Comment: none last 24 hrs  . Drug use: Yes    Comment: many years ago in highschool  . Sexual activity: Not Currently  Lifestyle  . Physical activity:    Days per week: Not on file    Minutes per session: Not on file  . Stress: Not on file  Relationships  . Social connections:    Talks on phone: Not on file    Gets together: Not on file    Attends religious service: Not on file    Active member of club or organization: Not on file    Attends meetings of clubs or organizations: Not on file    Relationship status: Not on file  Other Topics Concern  . Not on file  Social History Narrative   Very rare exercise     Family History: The patient's family history includes Aortic stenosis in his maternal grandfather; Breast cancer in his  maternal grandmother; Cancer in his maternal grandfather and unknown relative; Diabetes in his unknown relative.  ROS:   Please see the history of present illness.    Tired all the time, also off to sleep easily, snores despite his CPAP equipment.  Chronic headaches, neck and back pain, excessive sweating.  All other systems reviewed and are negative.  EKGs/Labs/Other Studies Reviewed:    The following studies were reviewed today: Echocardiogram April 09, 2017:  Study Conclusions  - Left ventricle: The cavity size was at the upper limits of   normal. Wall thickness was increased in a pattern of moderate   LVH. Systolic function was normal. The  estimated ejection   fraction was in the range of 50% to 55%. There is hypokinesis of   the anteroseptal myocardium. - Ventricular septum: Septal motion showed abnormal function and   dyssynergy. - Aortic valve: Possibly bicuspid; moderately calcified leaflets.   There was mild to moderate stenosis. There was mild to moderate   regurgitation. Valve area (VTI): 1.43 cm^2. Valve area (Vmax):   1.19 cm^2. Valve area (Vmean): 1.24 cm^2. - Left atrium: The atrium was mildly dilated. - Impressions: Technically very limited study due to poor sound   wave transmission. With Definity EF appears to be in 50-55% range   with septal dyssynergy due to conduction delay. Aortic valve not   well seen but may be bicuspid with moderate calcification. Mild   to moderate AS/AI. Consider tee if more information needed.  Impressions:  - Technically very limited study due to poor sound wave   transmission. With Definity EF appears to be in 50-55% range with   septal dyssynergy due to conduction delay. Aortic valve not well   seen but may be bicuspid with moderate calcification. Mild to   moderate AS/AI. Consider tee if more information needed.  EKG:  EKG is  ordered today.  The ekg ordered today demonstrates atrial fibrillation with moderate rate control,  prominent voltage.  Compared to prior 01/09/2017, the heart rate is faster.  Recent Labs: No results found for requested labs within last 8760 hours.  Recent Lipid Panel    Component Value Date/Time   CHOL 150 01/02/2017 0759   TRIG 133.0 01/02/2017 0759   HDL 35.50 (L) 01/02/2017 0759   CHOLHDL 4 01/02/2017 0759   VLDL 26.6 01/02/2017 0759   LDLCALC 88 01/02/2017 0759    Physical Exam:    VS:  BP 130/86   Pulse 95   Ht 6\' 1"  (1.854 m)   Wt 284 lb 9.6 oz (129.1 kg)   BMI 37.55 kg/m     Wt Readings from Last 3 Encounters:  04/23/18 284 lb 9.6 oz (129.1 kg)  08/14/17 289 lb 6.4 oz (131.3 kg)  06/26/17 291 lb (132 kg)     GEN: Obesity well developed in no acute distress HEENT: Normal NECK: No JVD. LYMPHATICS: No lymphadenopathy CARDIAC: IIRR, 3/6 crescendo decrescendo systolic murmur, no gallop, no edema. VASCULAR: 2+ bilateral carotid, radial, femoral pulses.  bruits. RESPIRATORY:  Clear to auscultation without rales, wheezing or rhonchi  ABDOMEN: Soft, non-tender, non-distended, No pulsatile mass, MUSCULOSKELETAL: No deformity  SKIN: Warm and dry NEUROLOGIC:  Alert and oriented x 3 PSYCHIATRIC:  Normal affect   ASSESSMENT:    1. Nonrheumatic aortic valve stenosis   2. Persistent atrial fibrillation   3. Essential hypertension   4. Chronic anticoagulation   5. Coronary artery disease of native artery of native heart with stable angina pectoris ([place])   6. Obstructive sleep apnea    PLAN:    In order of problems listed above:  1. Bicuspid aortic valve with moderate AS and mild AR and no symptoms.  He has not had syncope, angina, or change in chronic dyspnea on exertion. 2. Start Eliquis 5 mg twice daily.  Creatinine and hemoglobin in 6 months. 3. Low-salt diet.  Target 130/80 mmHg or less. 4. See above.  Eliquis started and aspirin has been discontinued. 5. Stable without angina pectoris. 6. Will establish with a sleep physician here in [place].  Needs a new  prescription.  Have documentation that his current settings are in effect.  Follow-up with me  in 1 year.  Establish with a certified sleep physician to take changes in sleep prescription to provide an adequate margin of safety.  Watch for evidence of bleeding on anticoagulation therapy.  Creatinine and hemoglobin 6 months.  Clinical follow-up in 1 year.  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  Prolonged office visit with greater than 50% of the time spent in coordination of care, education, and counseling concerning stroke risk, bleeding risk on anticoagulation, and management of sleep apnea.  Much background work will need to be done to manage his problems including reaching out to his sleep specialist.  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  To establish a follow-up mechanism to avoid future lapses in care.   Medication Adjustments/Labs and Tests Ordered: Current medicines are reviewed at length with the patient today.  Concerns regarding medicines are outlined above.  Orders Placed This Encounter  Procedures  . EKG 12-Lead   No orders of the defined types were placed in this encounter.   There are no Patient Instructions on file for this visit.   Signed, Sinclair Grooms, MD  04/23/2018 12:07 PM    [place]

## 2018-04-23 ENCOUNTER — Ambulatory Visit (INDEPENDENT_AMBULATORY_CARE_PROVIDER_SITE_OTHER): Payer: Managed Care, Other (non HMO) | Admitting: Interventional Cardiology

## 2018-04-23 ENCOUNTER — Encounter: Payer: Self-pay | Admitting: *Deleted

## 2018-04-23 ENCOUNTER — Encounter: Payer: Self-pay | Admitting: Interventional Cardiology

## 2018-04-23 VITALS — BP 130/86 | HR 95 | Ht 73.0 in | Wt 284.6 lb

## 2018-04-23 DIAGNOSIS — I350 Nonrheumatic aortic (valve) stenosis: Secondary | ICD-10-CM | POA: Diagnosis not present

## 2018-04-23 DIAGNOSIS — I25118 Atherosclerotic heart disease of native coronary artery with other forms of angina pectoris: Secondary | ICD-10-CM

## 2018-04-23 DIAGNOSIS — Z7901 Long term (current) use of anticoagulants: Secondary | ICD-10-CM

## 2018-04-23 DIAGNOSIS — G4733 Obstructive sleep apnea (adult) (pediatric): Secondary | ICD-10-CM

## 2018-04-23 DIAGNOSIS — I10 Essential (primary) hypertension: Secondary | ICD-10-CM

## 2018-04-23 DIAGNOSIS — I4819 Other persistent atrial fibrillation: Secondary | ICD-10-CM | POA: Diagnosis not present

## 2018-04-23 MED ORDER — APIXABAN 5 MG PO TABS: 5.0000 mg | ORAL_TABLET | Freq: Two times a day (BID) | ORAL | 11 refills | Status: DC

## 2018-04-23 NOTE — Patient Instructions (Signed)
Medication Instructions:  1) DISCONTINUE Aspirin 2) START Eliquis 5mg  twice daily  If you need a refill on your cardiac medications before your next appointment, please call your pharmacy.   Lab work: You will need to return in 6 months for lab work.  I will contact you when it is closer to time to schedule.  If you have labs (blood work) drawn today and your tests are completely normal, you will receive your results only by: [name] MyChart Message (if you have MyChart) OR . A paper copy in the mail If you have any lab test that is abnormal or we need to change your treatment, we will call you to review the results.  Testing/Procedures: Your physician has requested that you have an echocardiogram just prior to seeing Dr. Tamala Julian back in one year. Echocardiography is a painless test that uses sound waves to create images of your heart. It provides your doctor with information about the size and shape of your heart and how well your heart's chambers and valves are working. This procedure takes approximately one hour. There are no restrictions for this procedure.    Follow-Up: You have been referred to Dr. Radford Pax or Dr. Claiborne Billings for sleep apnea follow up.  At [facility], you and your health needs are our priority.  As part of our continuing mission to provide you with exceptional heart care, we have created designated Provider Care Teams.  These Care Teams include your primary Cardiologist (physician) and Advanced Practice Providers (APPs -  Physician Assistants and Nurse Practitioners) who all work together to provide you with the care you need, when you need it. You will need a follow up appointment in 12 months.  Please call our office 2 months in advance to schedule this appointment.  You may see Dr. Tamala Julian or one of the following Advanced Practice Providers on your designated Care Team:   Truitt Merle, NP Cecilie Kicks, NP . Kathyrn Drown, NP  Any Other Special Instructions Will Be Listed  Below (If Applicable).

## 2018-04-26 ENCOUNTER — Ambulatory Visit (INDEPENDENT_AMBULATORY_CARE_PROVIDER_SITE_OTHER): Payer: Managed Care, Other (non HMO) | Admitting: Cardiology

## 2018-04-26 ENCOUNTER — Encounter: Payer: Self-pay | Admitting: Cardiology

## 2018-04-26 VITALS — BP 132/88 | HR 77 | Ht 73.0 in | Wt 289.8 lb

## 2018-04-26 DIAGNOSIS — E1159 Type 2 diabetes mellitus with other circulatory complications: Secondary | ICD-10-CM | POA: Insufficient documentation

## 2018-04-26 DIAGNOSIS — G4733 Obstructive sleep apnea (adult) (pediatric): Secondary | ICD-10-CM

## 2018-04-26 DIAGNOSIS — I10 Essential (primary) hypertension: Secondary | ICD-10-CM

## 2018-04-26 DIAGNOSIS — E669 Obesity, unspecified: Secondary | ICD-10-CM | POA: Diagnosis not present

## 2018-04-26 DIAGNOSIS — E1169 Type 2 diabetes mellitus with other specified complication: Secondary | ICD-10-CM

## 2018-04-26 HISTORY — DX: Obesity, unspecified: E66.9

## 2018-04-26 NOTE — Patient Instructions (Signed)
Medication Instructions:  Your physician recommends that you continue on your current medications as directed. Please refer to the Current Medication list given to you today.  If you need a refill on your cardiac medications before your next appointment, please call your pharmacy.   Lab work:  If you have labs (blood work) drawn today and your tests are completely normal, you will receive your results only by: . MyChart Message (if you have MyChart) OR . A paper copy in the mail If you have any lab test that is abnormal or we need to change your treatment, we will call you to review the results.  Follow-Up: At [facility], you and your health needs are our priority.  As part of our continuing mission to provide you with exceptional heart care, we have created designated Provider Care Teams.  These Care Teams include your primary Cardiologist (physician) and Advanced Practice Providers (APPs -  Physician Assistants and Nurse Practitioners) who all work together to provide you with the care you need, when you need it. You will need a follow up appointment in 1 years.  Please call our office 2 months in advance to schedule this appointment.  You may see Dr. Turner or one of the following Advanced Practice Providers on your designated Care Team:   Brittainy Simmons, PA-C Dayna Dunn, PA-C . Michele Lenze, PA-C    
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
## 2018-04-30 ENCOUNTER — Telehealth: Payer: Self-pay | Admitting: Cardiology

## 2018-04-30 DIAGNOSIS — M5412 Radiculopathy, cervical region: Secondary | ICD-10-CM | POA: Insufficient documentation

## 2018-04-30 DIAGNOSIS — R519 Headache, unspecified: Secondary | ICD-10-CM | POA: Insufficient documentation

## 2018-04-30 DIAGNOSIS — M542 Cervicalgia: Secondary | ICD-10-CM | POA: Insufficient documentation

## 2018-04-30 NOTE — Telephone Encounter (Signed)
Walk in Pt Form-paperwork dropped off. Placed in [place] doc box.

## 2018-05-05 ENCOUNTER — Telehealth: Payer: Self-pay | Admitting: *Deleted

## 2018-05-05 DIAGNOSIS — G4733 Obstructive sleep apnea (adult) (pediatric): Secondary | ICD-10-CM

## 2018-05-05 NOTE — Telephone Encounter (Signed)
-----   Message from Sueanne Margarita, MD sent at 05/04/2018 10:03 PM EDT ----- AHI is too high - please get 2 week autotitration from 4 to 18cm H2O

## 2018-05-05 NOTE — Telephone Encounter (Signed)
Order faxed to [facility]

## 2018-05-05 NOTE — Telephone Encounter (Signed)
Informed patient of compliance results and verbalized understanding was indicated. Patient understands his apneic events are high at 8.3. Patient understands we will get a 2 week autotitration from 4 to 18cm H2O. Pt is aware and agreeable to treatment.

## 2018-05-05 NOTE — Telephone Encounter (Signed)
-----   Message from Sueanne Margarita, MD sent at 05/02/2018  2:46 PM EDT ----- AHI too high - please get a 2 week autotitration from 4 to 18cm H2O as well as overnight pulse ox

## 2018-05-13 NOTE — Addendum Note (Signed)
Addended by: Freada Bergeron on: 05/13/2018 03:56 PM   Modules accepted: Orders

## 2018-05-13 NOTE — Telephone Encounter (Signed)
[facility] Butch Penny) called back to say she never received the order for the autotitration because when they changed over from [facility] in July all of their fax numbers changed and we did not get the updated fax number. I have faxed the titration order over to [facility] to fax# [phone] but the order for the Over night pulse ox will be faxed to [facility] because [facility] does not perform those.

## 2018-05-13 NOTE — Telephone Encounter (Signed)
Over night Pulse Ox faxed to [facility].

## 2018-05-13 NOTE — Telephone Encounter (Signed)
Called [facility] to ask about patients auto titration had to [facility].

## 2018-05-14 ENCOUNTER — Ambulatory Visit (INDEPENDENT_AMBULATORY_CARE_PROVIDER_SITE_OTHER): Payer: Managed Care, Other (non HMO) | Admitting: Internal Medicine

## 2018-05-14 ENCOUNTER — Encounter

## 2018-05-14 VITALS — BP 124/78 | HR 56 | Temp 97.8°F | Resp 18 | Ht 73.0 in | Wt 283.0 lb

## 2018-05-14 DIAGNOSIS — I10 Essential (primary) hypertension: Secondary | ICD-10-CM

## 2018-05-14 DIAGNOSIS — R7989 Other specified abnormal findings of blood chemistry: Secondary | ICD-10-CM | POA: Diagnosis not present

## 2018-05-14 DIAGNOSIS — E669 Obesity, unspecified: Secondary | ICD-10-CM

## 2018-05-14 DIAGNOSIS — Z9109 Other allergy status, other than to drugs and biological substances: Secondary | ICD-10-CM

## 2018-05-14 DIAGNOSIS — Z0000 Encounter for general adult medical examination without abnormal findings: Secondary | ICD-10-CM

## 2018-05-14 DIAGNOSIS — E119 Type 2 diabetes mellitus without complications: Secondary | ICD-10-CM | POA: Diagnosis not present

## 2018-05-14 DIAGNOSIS — I4819 Other persistent atrial fibrillation: Secondary | ICD-10-CM | POA: Diagnosis not present

## 2018-05-14 DIAGNOSIS — I350 Nonrheumatic aortic (valve) stenosis: Secondary | ICD-10-CM

## 2018-05-14 DIAGNOSIS — G4733 Obstructive sleep apnea (adult) (pediatric): Secondary | ICD-10-CM

## 2018-05-14 DIAGNOSIS — Z125 Encounter for screening for malignant neoplasm of prostate: Secondary | ICD-10-CM | POA: Diagnosis not present

## 2018-05-14 DIAGNOSIS — E785 Hyperlipidemia, unspecified: Secondary | ICD-10-CM

## 2018-05-14 LAB — LIPID PANEL
Cholesterol: 289 mg/dL — ABNORMAL HIGH (ref 0–200)
HDL: 37.7 mg/dL — ABNORMAL LOW (ref >39.00)
NonHDL: 251.16
Total CHOL/HDL Ratio: 8
Triglycerides: 221 mg/dL — ABNORMAL HIGH (ref 0.0–149.0)
VLDL: 44.2 mg/dL — ABNORMAL HIGH (ref 0.0–40.0)

## 2018-05-14 LAB — BASIC METABOLIC PANEL
BUN: 15 mg/dL (ref 6–23)
CO2: 26 mEq/L (ref 19–32)
Calcium: 9.9 mg/dL (ref 8.4–10.5)
Chloride: 103 mEq/L (ref 96–112)
Creatinine, Ser: 1.17 mg/dL (ref 0.40–1.50)
GFR: 69.41 mL/min (ref >60.00)
Glucose, Bld: 133 mg/dL — ABNORMAL HIGH (ref 70–99)
Potassium: 4.1 mEq/L (ref 3.5–5.1)
Sodium: 142 mEq/L (ref 135–145)

## 2018-05-14 LAB — CBC WITH DIFFERENTIAL/PLATELET
Basophils Absolute: 0.1 10*3/uL (ref 0.0–0.1)
Basophils Relative: 1.1 % (ref 0.0–3.0)
Eosinophils Absolute: 0.1 10*3/uL (ref 0.0–0.7)
Eosinophils Relative: 1.1 % (ref 0.0–5.0)
HCT: 48.5 % (ref 39.0–52.0)
Hemoglobin: 16.6 g/dL (ref 13.0–17.0)
Lymphocytes Relative: 37.6 % (ref 12.0–46.0)
Lymphs Abs: 2.3 10*3/uL (ref 0.7–4.0)
MCHC: 34.2 g/dL (ref 30.0–36.0)
MCV: 92.2 fl (ref 78.0–100.0)
Monocytes Absolute: 0.5 10*3/uL (ref 0.1–1.0)
Monocytes Relative: 7.5 % (ref 3.0–12.0)
Neutro Abs: 3.2 10*3/uL (ref 1.4–7.7)
Neutrophils Relative %: 52.7 % (ref 43.0–77.0)
Platelets: 186 10*3/uL (ref 150.0–400.0)
RBC: 5.25 Mil/uL (ref 4.22–5.81)
RDW: 14.4 % (ref 11.5–15.5)
WBC: 6 10*3/uL (ref 4.0–10.5)

## 2018-05-14 LAB — HM DIABETES FOOT EXAM

## 2018-05-14 LAB — MICROALBUMIN / CREATININE URINE RATIO
Creatinine,U: 104.2 mg/dL
Microalb Creat Ratio: 0.7 mg/g (ref 0.0–30.0)
Microalb, Ur: 0.7 mg/dL (ref 0.0–1.9)

## 2018-05-14 LAB — TSH: TSH: 1.56 u[IU]/mL (ref 0.35–4.50)

## 2018-05-14 LAB — LDL CHOLESTEROL, DIRECT: Direct LDL: 228 mg/dL

## 2018-05-14 LAB — HEPATIC FUNCTION PANEL
ALT: 35 U/L (ref 0–53)
AST: 19 U/L (ref 0–37)
Albumin: 4.8 g/dL (ref 3.5–5.2)
Alkaline Phosphatase: 65 U/L (ref 39–117)
Bilirubin, Direct: 0.1 mg/dL (ref 0.0–0.3)
Total Bilirubin: 0.7 mg/dL (ref 0.2–1.2)
Total Protein: 7.2 g/dL (ref 6.0–8.3)

## 2018-05-14 LAB — HEMOGLOBIN A1C: Hgb A1c MFr Bld: 6.9 % — ABNORMAL HIGH (ref 4.6–6.5)

## 2018-05-14 LAB — PSA: PSA: 0.72 ng/mL (ref 0.10–4.00)

## 2018-05-14 NOTE — Progress Notes (Signed)
Patient ID: Dalton [name], male   DOB: [birth year], 52 y.o.   MRN: [id number]   Subjective:    Patient ID: Dalton [name], male    DOB: July 28, 1965, 52 y.o.   MRN: [id number]  HPI  Patient here for his physical exam.  He just saw cardiology 04/23/18 for f/u bicuspid aortic valve with moderante AS and mild AR.  Did agree to start eliquis given history of afib.  Was referred to Dr Radford Pax for f/u and reevaluation of sleep apnea.  Using cpap.  Is stopping eating red meats.  Discussed diet and exercise.  States am sugars averaging 140-150.  Lowest reading 78.  No significant low sugars.  Discussed eating regular meals and appropriate snacks.  No chest pain.  Breathing stable.  No acid reflux.  No abdominal apin.  Bowels moving.  Evaluated at [facility] for neck pain.  Given prednisone.  Had c-spine xray and MRI.  Continue f/u with ortho.     Past Medical History:  Diagnosis Date  . Aortic valve stenosis    a. Bicuspid AV - 2D Echo 06/2014 - mod AS, mild AI, mildly dilated aortic root.  [name] CAD (coronary artery disease)   . Carotid artery disease ([place])    a. Mild by duplex 05/2015 - 1-39% BICA. Repeat due 05/2017.  . Dilated aortic root ([place])    a. By echo 06/2014.  . Environmental allergies   . Hypercholesterolemia   . Hypertension   . Obesity (BMI 30-39.9) 04/26/2018  . Sleep apnea   . SVT (supraventricular tachycardia) ([place])    a. h/o SVT - Ablation done 2013.   Past Surgical History:  Procedure Laterality Date  . CARDIAC ELECTROPHYSIOLOGY STUDY AND ABLATION  08/21/2011  . COLONOSCOPY WITH PROPOFOL N/A 06/26/2017   Procedure: COLONOSCOPY WITH PROPOFOL;  Surgeon: Manya Silvas, MD;  Location: [facility] ENDOSCOPY;  Service: Endoscopy;  Laterality: N/A;  . ESOPHAGOGASTRODUODENOSCOPY (EGD) WITH PROPOFOL N/A 06/26/2017   Procedure: ESOPHAGOGASTRODUODENOSCOPY (EGD) WITH PROPOFOL;  Surgeon: Manya Silvas, MD;  [facility] ENDOSCOPY;  Service: Endoscopy;  Laterality: N/A;  . SEPTOPLASTY N/A  03/22/2015   Procedure: SEPTOPLASTY  WITH RIGHT INFERIOR TURBINATE REDUCTION;  Surgeon: Margaretha Sheffield, MD;  Location: [place];  Service: ENT;  Laterality: N/A;  . SUPRAVENTRICULAR TACHYCARDIA ABLATION N/A 08/21/2011   Procedure: SUPRAVENTRICULAR TACHYCARDIA ABLATION;  Surgeon: Evans Lance, MD;  Location: [facility] C[facility] LAB;  Service: Cardiovascular;  Laterality: N/A;  . surgery for non descending testicle    . TONSILLECTOMY     Family History  Problem Relation Age of Onset  . Diabetes Unknown   . Cancer Unknown   . Breast cancer Maternal Grandmother   . Cancer Maternal Grandfather   . Aortic stenosis Maternal Grandfather    Social History   Socioeconomic History  . Marital status: Married    Spouse name: Not on file  . Number of children: Not on file  . Years of education: Not on file  . Highest education level: Not on file  Occupational History  . Occupation: truck Education administrator: Insurance account manager  Social Needs  . Financial resource strain: Not on file  . Food insecurity:    Worry: Not on file    Inability: Not on file  . Transportation needs:    Medical: Not on file    Non-medical: Not on file  Tobacco Use  . Smoking status: Former Smoker    Types: Cigarettes    Last attempt to quit: 11/04/1988  Years since quitting: 29.5  . Smokeless tobacco: Never Used  Substance and Sexual Activity  . Alcohol use: Yes    Alcohol/week: 8.0 standard drinks    Types: 7 Glasses of wine, 1 Shots of liquor per week    Comment: none last 24 hrs  . Drug use: Yes    Comment: many years ago in highschool  . Sexual activity: Not Currently  Lifestyle  . Physical activity:    Days per week: Not on file    Minutes per session: Not on file  . Stress: Not on file  Relationships  . Social connections:    Talks on phone: Not on file    Gets together: Not on file    Attends religious service: Not on file    Active member of club or organization: Not on file    Attends meetings of  clubs or organizations: Not on file    Relationship status: Not on file  Other Topics Concern  . Not on file  Social History Narrative   Very rare exercise    Outpatient Encounter Medications as of 05/14/2018  Medication Sig  . apixaban (ELIQUIS) 5 MG TABS tablet Take 1 tablet (5 mg total) by mouth 2 (two) times daily.  [name] atorvastatin (LIPITOR) 40 MG tablet TAKE 1 TABLET BY MOUTH EVERY DAY  . cetirizine (ZYRTEC) 10 MG tablet Take 10 mg by mouth daily.   . fluticasone (FLONASE) 50 MCG/ACT nasal spray Place 2 sprays into both nostrils daily as needed for allergies or rhinitis.  . hydrochlorothiazide (MICROZIDE) 12.5 MG capsule TAKE 1 CAPSULE (12.5 MG TOTAL) BY MOUTH DAILY.  [name] lisinopril (PRINIVIL,ZESTRIL) 20 MG tablet TAKE ONE TABLET BY MOUTH ONCE DAILY  . metoprolol succinate (TOPROL-XL) 25 MG 24 hr tablet Take 1 tablet (25 mg total) by mouth daily.  . NON FORMULARY as directed. CPAP  . ONETOUCH DELICA LANCETS 56L MISC USE TWICE DAILY AS DIRECTED  . ONETOUCH VERIO test strip USE TWICE DAILY AS DIRECTED   No facility-administered encounter medications on file as of 05/14/2018.     Review of Systems  Constitutional: Negative for appetite change and unexpected weight change.  HENT: Negative for congestion and sinus pressure.   Eyes: Negative for pain and visual disturbance.  Respiratory: Negative for cough, chest tightness and shortness of breath.   Cardiovascular: Negative for chest pain, palpitations and leg swelling.  Gastrointestinal: Negative for abdominal pain, diarrhea, nausea and vomiting.  Genitourinary: Negative for difficulty urinating and dysuria.  Musculoskeletal: Negative for joint swelling and myalgias.  Skin: Negative for color change and rash.  Neurological: Negative for dizziness, light-headedness and headaches.  Hematological: Negative for adenopathy. Does not bruise/bleed easily.  Psychiatric/Behavioral: Negative for agitation and dysphoric mood.       Objective:     Physical Exam  Constitutional: He is oriented to person, place, and time. He appears well-developed and well-nourished. No distress.  HENT:  Head: Normocephalic and atraumatic.  Nose: Nose normal.  Mouth/Throat: Oropharynx is clear and moist. No oropharyngeal exudate.  Eyes: Conjunctivae are normal. Right eye exhibits no discharge. Left eye exhibits no discharge.  Neck: Neck supple. No thyromegaly present.  Cardiovascular: Normal rate and regular rhythm.  Pulmonary/Chest: Breath sounds normal. No respiratory distress. He has no wheezes.  Abdominal: Soft. Bowel sounds are normal. There is no tenderness.  Genitourinary:  Genitourinary Comments: Not performed.    Musculoskeletal: He exhibits no edema or tenderness.  Feet:  No lesions.  DP pulses palpable and equal bilaterally.  Sensation intact to light touch and pin prick.   Lymphadenopathy:    He has no cervical adenopathy.  Neurological: He is alert and oriented to person, place, and time.  Skin: No rash noted. No erythema.  Psychiatric: He has a normal mood and affect. His behavior is normal.    BP 124/78 (BP Location: Left Arm, Patient Position: Sitting, Cuff Size: Normal)   Pulse (!) 56   Temp 97.8 F (36.6 C) (Oral)   Resp 18   Ht _0  (1.854 m)   Wt 283 lb (128.4 kg)   SpO2 98%   BMI 37.34 kg/m  Wt Readings from Last 3 Encounters:  05/14/18 283 lb (128.4 kg)  04/26/18 289 lb 12.8 oz (131.5 kg)  04/23/18 284 lb 9.6 oz (129.1 kg)     Lab Results  Component Value Date   WBC 6.0 05/14/2018   HGB 16.6 05/14/2018   HCT 48.5 05/14/2018   PLT 186.0 05/14/2018   GLUCOSE 133 (H) 05/14/2018   CHOL 289 (H) 05/14/2018   TRIG 221.0 (H) 05/14/2018   HDL 37.70 (L) 05/14/2018   LDLDIRECT 228.0 05/14/2018   LDLCALC 88 01/02/2017   ALT 35 05/14/2018   AST 19 05/14/2018   NA 142 05/14/2018   K 4.1 05/14/2018   CL 103 05/14/2018   CREATININE 1.17 05/14/2018   BUN 15 05/14/2018   CO2 26 05/14/2018   TSH 1.56 05/14/2018    PSA 0.72 05/14/2018   INR 1.0 08/14/2011   HGBA1C 6.9 (H) 05/14/2018   MICROALBUR 0.7 05/14/2018       Assessment & Plan:   Problem List Items Addressed This Visit    Aortic valve stenosis    Followed by cardiology.  On eliquis now.  Just started.        Diabetes mellitus ([place])    Sugars as outlined.  Discussed low carb diet and exercise.  Follow met b and a1c.        Relevant Orders   Hemoglobin A1c (Completed)   Hepatic function panel (Completed)   Lipid panel (Completed)   Basic metabolic panel (Completed)   Microalbumin / creatinine urine ratio (Completed)   Environmental allergies    Controlled.       Essential hypertension    Blood pressure under good control.  Continue same medication regimen.  Follow pressures.  Follow metabolic panel.        Relevant Orders   CBC with Differential/Platelet (Completed)   TSH (Completed)   Hyperlipidemia    Low cholesterol diet and exercise.  Follow lipid panel.       Low testosterone level in male - Primary    Has seen urology.  Request to have levels rechecked.  Follow.       Relevant Orders   Testos,Total,Free and SHBG (Male)   Obesity (BMI 30-39.9)    Discussed diet and exercise.  Follow.        Persistent atrial fibrillation    On eliquis.  Stable.  Follow.  Continue f/u with cardiology.        Sleep apnea    Uses cpap regularly.  Seeing Dr Radford Pax.         Other Visit Diagnoses    Prostate cancer screening       Relevant Orders   PSA (Completed)       Einar Pheasant, MD

## 2018-05-16 ENCOUNTER — Encounter: Payer: Self-pay | Admitting: Internal Medicine

## 2018-05-16 NOTE — Assessment & Plan Note (Signed)
Discussed diet and exercise.  Follow.  

## 2018-05-16 NOTE — Assessment & Plan Note (Signed)
Sugars as outlined.  Discussed low carb diet and exercise.  Follow met b and a1c.   

## 2018-05-16 NOTE — Assessment & Plan Note (Signed)
Followed by cardiology.  On eliquis now.  Just started.

## 2018-05-16 NOTE — Assessment & Plan Note (Signed)
Low cholesterol diet and exercise.  Follow lipid panel.   

## 2018-05-16 NOTE — Assessment & Plan Note (Signed)
Blood pressure under good control.  Continue same medication regimen.  Follow pressures.  Follow metabolic panel.   

## 2018-05-16 NOTE — Assessment & Plan Note (Signed)
Uses cpap regularly.  Seeing Dr Radford Pax.

## 2018-05-16 NOTE — Assessment & Plan Note (Signed)
Has seen urology.  Request to have levels rechecked.  Follow.

## 2018-05-16 NOTE — Assessment & Plan Note (Signed)
On eliquis.  Stable.  Follow.  Continue f/u with cardiology.

## 2018-05-16 NOTE — Assessment & Plan Note (Signed)
Controlled.  

## 2018-05-18 ENCOUNTER — Telehealth: Payer: Self-pay

## 2018-05-18 ENCOUNTER — Other Ambulatory Visit: Payer: Self-pay

## 2018-05-18 NOTE — Telephone Encounter (Signed)
Called lab regarding testosterone result. Test has not been resulted yet. Dalton [name] says that it takes a little longer to get these results but should be back by the end of day today.

## 2018-05-19 ENCOUNTER — Encounter: Payer: Self-pay | Admitting: Internal Medicine

## 2018-05-19 LAB — TESTOS,TOTAL,FREE AND SHBG (FEMALE)
Free Testosterone: 68.2 pg/mL (ref 35.0–155.0)
Sex Hormone Binding: 33 nmol/L (ref 10–50)
Testosterone, Total, LC-MS-MS: 446 ng/dL (ref 250–1100)

## 2018-05-30 ENCOUNTER — Other Ambulatory Visit: Payer: Self-pay | Admitting: Internal Medicine

## 2018-06-25 ENCOUNTER — Ambulatory Visit: Payer: Managed Care, Other (non HMO) | Admitting: Family Medicine

## 2018-07-05 ENCOUNTER — Ambulatory Visit (INDEPENDENT_AMBULATORY_CARE_PROVIDER_SITE_OTHER): Payer: Managed Care, Other (non HMO) | Admitting: Family

## 2018-07-05 ENCOUNTER — Encounter: Payer: Self-pay | Admitting: Family

## 2018-07-05 VITALS — BP 128/82 | HR 95 | Temp 98.7°F | Ht 73.0 in | Wt 292.4 lb

## 2018-07-05 DIAGNOSIS — L0291 Cutaneous abscess, unspecified: Secondary | ICD-10-CM

## 2018-07-05 MED ORDER — DOXYCYCLINE HYCLATE 100 MG PO TABS: 100.0000 mg | ORAL_TABLET | Freq: Two times a day (BID) | ORAL | 0 refills | Status: DC

## 2018-07-05 NOTE — Progress Notes (Signed)
Subjective:    Patient ID: [name], male    DOB: 1966-01-08, 52 y.o.   MRN: [id number]  CC: [name] is a 52 y.o. male who presents today for an acute visit.    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  He has had the scar for over 40 years with no problems.  No bulging.  He does note purulent discharge at one point from left-sided wound.  No history of MRSA.  No new detergents.  Has used Bactroban with temporary relief.  History of atrial fib-on Eliquis     HISTORY:  Past Medical History:  Diagnosis Date  . Aortic valve stenosis    a. Bicuspid AV - 2D Echo 06/2014 - mod AS, mild AI, mildly dilated aortic root.  [name] CAD (coronary artery disease)   . Carotid artery disease ([name])    a. Mild by duplex 05/2015 - 1-39% BICA. Repeat due 05/2017.  . Dilated aortic root ([name])    a. By echo 06/2014.  . Environmental allergies   . Hypercholesterolemia   . Hypertension   . Obesity (BMI 30-39.9) 04/26/2018  . Sleep apnea   . SVT (supraventricular tachycardia) ([place])    a. h/o SVT - Ablation done 2013.   Past Surgical History:  Procedure Laterality Date  . CARDIAC ELECTROPHYSIOLOGY STUDY AND ABLATION  08/21/2011  . COLONOSCOPY WITH PROPOFOL N/A 06/26/2017   Procedure: COLONOSCOPY WITH PROPOFOL;  Surgeon: Manya Silvas, MD;  Location: [name];  Service: Endoscopy;  Laterality: N/A;  . ESOPHAGOGASTRODUODENOSCOPY (EGD) WITH PROPOFOL N/A 06/26/2017   Procedure: ESOPHAGOGASTRODUODENOSCOPY (EGD) WITH PROPOFOL;  Surgeon: Manya Silvas, MD;  Location: [name];  Service: Endoscopy;  Laterality: N/A;  . SEPTOPLASTY N/A 03/22/2015   Procedure: SEPTOPLASTY  WITH RIGHT INFERIOR TURBINATE REDUCTION;  Surgeon: Margaretha Sheffield, MD;  Location: [name];  Service: ENT;  Laterality: N/A;  . SUPRAVENTRICULAR TACHYCARDIA ABLATION N/A  08/21/2011   Procedure: SUPRAVENTRICULAR TACHYCARDIA ABLATION;  Surgeon: Evans Lance, MD;  Location: [name] C[facility];  Service: Cardiovascular;  Laterality: N/A;  . surgery for non descending testicle    . TONSILLECTOMY     Family History  Problem Relation Age of Onset  . Diabetes Unknown   . Cancer Unknown   . Breast cancer Maternal Grandmother   . Cancer Maternal Grandfather   . Aortic stenosis Maternal Grandfather     Allergies: Tomato Current Outpatient Medications on File Prior to Visit  Medication Sig Dispense Refill  . apixaban (ELIQUIS) 5 MG TABS tablet Take 1 tablet (5 mg total) by mouth 2 (two) times daily. 60 tablet 11  . atorvastatin (LIPITOR) 40 MG tablet TAKE 1 TABLET BY MOUTH EVERY DAY 30 tablet 2  . cetirizine (ZYRTEC) 10 MG tablet Take 10 mg by mouth daily.     . fluticasone (FLONASE) 50 MCG/ACT nasal spray Place 2 sprays into both nostrils daily as needed for allergies or rhinitis.    . hydrochlorothiazide (MICROZIDE) 12.5 MG capsule TAKE 1 CAPSULE (12.5 MG TOTAL) BY MOUTH DAILY. 90 capsule 2  . lisinopril (PRINIVIL,ZESTRIL) 20 MG tablet TAKE ONE TABLET BY MOUTH ONCE DAILY 30 tablet 11  . metoprolol succinate (TOPROL-XL) 25 MG 24 hr tablet Take 1 tablet (25 mg total) by mouth daily. 90 tablet 3  . NON FORMULARY as directed. CPAP    . ONETOUCH DELICA LANCETS  33G MISC USE TWICE DAILY AS DIRECTED 200 each 3  . ONETOUCH VERIO test strip USE TWICE DAILY AS DIRECTED 100 each 15   No current facility-administered medications on file prior to visit.     Social History   Tobacco Use  . Smoking status: Former Smoker    Types: Cigarettes    Last attempt to quit: 11/04/1988    Years since quitting: 29.6  . Smokeless tobacco: Never Used  Substance Use Topics  . Alcohol use: Yes    Alcohol/week: 8.0 standard drinks    Types: 7 Glasses of wine, 1 Shots of liquor per week    Comment: none last 24 hrs  . Drug use: Yes    Comment: many years ago in highschool    Review of  Systems  Constitutional: Negative for chills and fever.  Respiratory: Negative for cough.   Cardiovascular: Negative for chest pain and palpitations.  Gastrointestinal: Negative for nausea and vomiting.  Skin: Positive for wound.      Objective:    BP 128/82   Pulse 95   Temp 98.7 F (37.1 C) (Oral)   Ht 6\' 1"  (1.854 m)   Wt 292 lb 6.4 oz (132.6 kg)   SpO2 95%   BMI 38.58 kg/m    Physical Exam Vitals signs reviewed.  Constitutional:      Appearance: He is well-developed.  Cardiovascular:     Rate and Rhythm: Regular rhythm.     Heart sounds: Normal heart sounds.  Pulmonary:     Effort: Pulmonary effort is normal. No respiratory distress.     Breath sounds: Normal breath sounds. No wheezing, rhonchi or rales.  Skin:    General: Skin is warm and dry.          Comments: Proximally 1 to 2cm raised area of tenderness, erythema over scar as marked on diagram.  No increased warmth, purulent discharge.  Nonfluctuant.  Neurological:     Mental Status: He is alert.  Psychiatric:        Speech: Speech normal.        Behavior: Behavior normal.        Assessment & Plan:  1. Abscess Afebrile well-appearing today.  Wound is nonfluctuant, not spontaneously draining today.  Jointly agreed that based on location over scar, suspected scar tissue, I felt uncomfortable with attempting incision and drainage- nor was wound fluctuant.  We jointly agreed we would trial oral antibiotic with close vigilance.  Also place referral general surgeon for further evaluation. - Ambulatory referral to General Surgery - doxycycline (VIBRA-TABS) 100 MG tablet; Take 1 tablet (100 mg total) by mouth 2 (two) times daily.  Dispense: 10 tablet; Refill: 0     I am having [name] start on doxycycline. I am also having him maintain his lisinopril, cetirizine, NON FORMULARY, fluticasone, metoprolol succinate, ONETOUCH DELICA LANCETS 16X, hydrochlorothiazide, ONETOUCH VERIO, apixaban, and  atorvastatin.   Meds ordered this encounter  Medications  . doxycycline (VIBRA-TABS) 100 MG tablet    Sig: Take 1 tablet (100 mg total) by mouth 2 (two) times daily.    Dispense:  10 tablet    Refill:  0    Order Specific Question:   Supervising Provider    Answer:   Crecencio Mc [[id number]]    Return precautions given.   Risks, benefits, and alternatives of the medications and treatment plan prescribed today were discussed, and patient expressed understanding.   Education regarding symptom management and diagnosis given to patient on  AVS.  Continue to follow with Einar Pheasant, MD for routine health maintenance.   [name] and I agreed with plan.   Mable Paris, FNP

## 2018-07-05 NOTE — Patient Instructions (Addendum)
Please start doxycycline.  Please avoid the sun on this medication as it can be easier to sunburn. Probiotics Let me know if it does not improve, certainly gets worse.  Today we discussed referrals, orders. General surgery   I have placed these orders in the system for you.  Please be sure to give [place] a call if you have not heard from our office regarding this. We should hear from [place] within ONE week with information regarding your appointment. If not, please let me know immediately.

## 2018-07-16 ENCOUNTER — Other Ambulatory Visit (HOSPITAL_COMMUNITY): Payer: Managed Care, Other (non HMO)

## 2018-07-23 ENCOUNTER — Encounter: Payer: Self-pay | Admitting: Surgery

## 2018-07-23 ENCOUNTER — Ambulatory Visit (INDEPENDENT_AMBULATORY_CARE_PROVIDER_SITE_OTHER): Payer: Managed Care, Other (non HMO) | Admitting: Surgery

## 2018-07-23 ENCOUNTER — Other Ambulatory Visit: Payer: Self-pay

## 2018-07-23 DIAGNOSIS — L02214 Cutaneous abscess of groin: Secondary | ICD-10-CM | POA: Diagnosis not present

## 2018-07-23 MED ORDER — SULFAMETHOXAZOLE-TRIMETHOPRIM 800-160 MG PO TABS: 1.0000 | ORAL_TABLET | Freq: Two times a day (BID) | ORAL | 0 refills | Status: AC

## 2018-07-23 NOTE — Progress Notes (Signed)
07/23/2018  Reason for Visit:  Left groin abscess  Referring Provider:  Mable Paris, FNP  History of Present Illness: [name] is a 53 y.o. male presenting for evaluation of a left groin abscess.  Patient reports he's had a boil in his left groin for about a month, which drains and seals repeatedly.  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  The boil is small but when it's getting fuller there is more discomfort and redness, and when it drains it feels better.  He went to see his PCP and was prescribed doxycycline.  He completed the course a week ago but he says that he is still getting the boil.  Currently there is no drainage or pain.  He denies any fevers, chills.  Past Medical History: Past Medical History:  Diagnosis Date  . Aortic valve stenosis    a. Bicuspid AV - 2D Echo 06/2014 - mod AS, mild AI, mildly dilated aortic root.  [name] CAD (coronary artery disease)   . Carotid artery disease ([place])    a. Mild by duplex 05/2015 - 1-39% BICA. Repeat due 05/2017.  . Dilated aortic root ([place])    a. By echo 06/2014.  . Environmental allergies   . Hypercholesterolemia   . Hypertension   . Obesity (BMI 30-39.9) 04/26/2018  . Sleep apnea   . SVT (supraventricular tachycardia) ([place])    a. h/o SVT - Ablation done 2013.     Past Surgical History: Past Surgical History:  Procedure Laterality Date  . CARDIAC ELECTROPHYSIOLOGY STUDY AND ABLATION  08/21/2011  . COLONOSCOPY WITH PROPOFOL N/A 06/26/2017   Procedure: COLONOSCOPY WITH PROPOFOL;  Surgeon: Manya Silvas, MD;  [facility] ENDOSCOPY;  Service: Endoscopy;  Laterality: N/A;  . ESOPHAGOGASTRODUODENOSCOPY (EGD) WITH PROPOFOL N/A 06/26/2017   Procedure: ESOPHAGOGASTRODUODENOSCOPY (EGD) WITH PROPOFOL;  Surgeon: Manya Silvas, MD;  Location: [facility] ENDOSCOPY;  Service: Endoscopy;  Laterality: N/A;  . SEPTOPLASTY N/A 03/22/2015   Procedure:  SEPTOPLASTY  WITH RIGHT INFERIOR TURBINATE REDUCTION;  Surgeon: Margaretha Sheffield, MD;  Location: [place];  Service: ENT;  Laterality: N/A;  . SUPRAVENTRICULAR TACHYCARDIA ABLATION N/A 08/21/2011   Procedure: SUPRAVENTRICULAR TACHYCARDIA ABLATION;  Surgeon: Evans Lance, MD;  Location: [facility] CATH LAB;  Service: Cardiovascular;  Laterality: N/A;  . surgery for non descending testicle    . TONSILLECTOMY      Home Medications: Prior to Admission medications   Medication Sig Start Date End Date Taking? Authorizing Provider  apixaban (ELIQUIS) 5 MG TABS tablet Take 1 tablet (5 mg total) by mouth 2 (two) times daily. 04/23/18  Yes [name], MD  atorvastatin (LIPITOR) 40 MG tablet TAKE 1 TABLET BY MOUTH EVERY DAY 05/31/18  Yes Einar Pheasant, MD  cetirizine (ZYRTEC) 10 MG tablet Take 10 mg by mouth daily.    Yes [provider]  doxycycline (VIBRA-TABS) 100 MG tablet Take 1 tablet (100 mg total) by mouth 2 (two) times daily. 07/05/18  Yes Burnard Hawthorne, FNP  fluticasone (FLONASE) 50 MCG/ACT nasal spray Place 2 sprays into both nostrils daily as needed for allergies or rhinitis.   Yes [provider]  hydrochlorothiazide (MICROZIDE) 12.5 MG capsule TAKE 1 CAPSULE (12.5 MG TOTAL) BY MOUTH DAILY. 11/02/17  Yes [name], MD  lisinopril (PRINIVIL,ZESTRIL) 20 MG tablet TAKE ONE TABLET BY MOUTH ONCE DAILY 07/06/15  Yes [name], MD  metoprolol  succinate (TOPROL-XL) 25 MG 24 hr tablet Take 1 tablet (25 mg total) by mouth daily. 10/22/17  Yes [name], MD  NON FORMULARY as directed. CPAP   Yes [provider]  [facility] DELICA LANCETS 09X MISC USE TWICE DAILY AS DIRECTED 11/02/17  Yes Einar Pheasant, MD  [facility] VERIO test strip USE TWICE DAILY AS DIRECTED 12/22/17  Yes Einar Pheasant, MD  sulfamethoxazole-trimethoprim (BACTRIM DS,SEPTRA DS) 800-160 MG tablet Take 1 tablet by mouth 2 (two) times daily for 7 days. 07/23/18 07/30/18  Olean Ree, MD     Allergies: Allergies  Allergen Reactions  . Tomato Hives and Itching    Only allergic to raw tomato    Social History:  reports that he quit smoking about 29 years ago. His smoking use included cigarettes. He has never used smokeless tobacco. He reports current alcohol use of about 8.0 standard drinks of alcohol per week. He reports current drug use.   Family History: Family History  Problem Relation Age of Onset  . Diabetes Other   . Cancer Other   . Breast cancer Maternal Grandmother   . Cancer Maternal Grandfather   . Aortic stenosis Maternal Grandfather     Review of Systems: Review of Systems  Constitutional: Negative for chills and fever.  HENT: Negative for hearing loss.   Respiratory: Negative for cough.   Cardiovascular: Negative for chest pain.  Gastrointestinal: Negative for abdominal pain and nausea.  Genitourinary: Negative for dysuria.  Musculoskeletal: Negative for myalgias.  Skin:       Left groin abscess  Neurological: Negative for dizziness.  Psychiatric/Behavioral: Negative for depression.    Physical Exam BP (!) 152/93   Pulse 61   Temp (!) 97.5 F (36.4 C) (Temporal)   Ht 6\' 1"  (1.854 m)   Wt 281 lb 6.4 oz (127.6 kg)   SpO2 97%   BMI 37.13 kg/m  CONSTITUTIONAL: No acute distress HEENT:  Normocephalic, atraumatic, extraocular motion intact. NECK: Trachea is midline, and there is no jugular venous distension.  RESPIRATORY:  Lungs are clear, and breath sounds are equal bilaterally. Normal respiratory effort without pathologic use of accessory muscles. CARDIOVASCULAR: Heart is regular without murmurs, gallops, or rubs. GI: The abdomen is soft, obese, non-distended, nontender to palpation.  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  On the left groin, at about midpoint on the scar, there is a small wound about 2 cm long, 0.5 cm wide from prior abscess, with scab over it.  Scab removed and wound  probed with qtip, but the wound is healed.  No fluctuance or erythema, unable to palpate any mass underneath.  MUSCULOSKELETAL:  Normal muscle strength and tone in all four extremities.  No peripheral edema or cyanosis. SKIN: Skin turgor is normal. There are no pathologic skin lesions.  NEUROLOGIC:  Motor and sensation is grossly normal.  Cranial nerves are grossly intact. PSYCH:  Alert and oriented to person, place and time. Affect is normal.  Laboratory Analysis: No results found for this or any previous visit (from the past 24 hour(s)).  Imaging: No results found.  Assessment and Plan: This is a 53 y.o. male with a left groin abscess.  At this point, the abscess appears to have resolved.  There is no fluctuance, induration, or erythema around the wound, and there is no fluid that can be expressed on compression or probing.  No I&D procedure is needed at this point.  As a precaution, will  give the patient a 1 week course of Bactrim to help with any residual infection.  Discussed with him that he can apply a dry gauze dressing over the wound to protect it and provide a cushion so his belt does not rub against it and irritate it.    If he notices that the abscess is growing again, he should call us so we can do an I&D procedure.  Patient understands this plan and all of his questions have been answered.  Face-to-face time spent with the patient and care providers was 40 minutes, with more than 50% of the time spent counseling, educating, and coordinating care of the patient.     Melvyn Neth, [facility]

## 2018-07-23 NOTE — Patient Instructions (Signed)
Return as needed.The patient is aware to call back for any questions or concerns. Rx sent

## 2018-07-30 ENCOUNTER — Other Ambulatory Visit: Payer: Self-pay | Admitting: Interventional Cardiology

## 2018-08-10 ENCOUNTER — Encounter (HOSPITAL_COMMUNITY): Payer: Self-pay

## 2018-08-10 ENCOUNTER — Emergency Department (HOSPITAL_COMMUNITY)
Admission: EM | Admit: 2018-08-10 | Discharge: 2018-08-10 | Disposition: A | Discharge status: Home / Self Care | Payer: Managed Care, Other (non HMO) | Attending: Emergency Medicine | Admitting: Emergency Medicine

## 2018-08-10 ENCOUNTER — Other Ambulatory Visit: Payer: Self-pay

## 2018-08-10 ENCOUNTER — Emergency Department (HOSPITAL_COMMUNITY): Payer: Managed Care, Other (non HMO)

## 2018-08-10 DIAGNOSIS — J101 Influenza due to other identified influenza virus with other respiratory manifestations: Secondary | ICD-10-CM | POA: Diagnosis not present

## 2018-08-10 DIAGNOSIS — Z79899 Other long term (current) drug therapy: Secondary | ICD-10-CM | POA: Diagnosis not present

## 2018-08-10 DIAGNOSIS — J111 Influenza due to unidentified influenza virus with other respiratory manifestations: Secondary | ICD-10-CM

## 2018-08-10 DIAGNOSIS — Z87891 Personal history of nicotine dependence: Secondary | ICD-10-CM | POA: Insufficient documentation

## 2018-08-10 DIAGNOSIS — I10 Essential (primary) hypertension: Secondary | ICD-10-CM | POA: Diagnosis not present

## 2018-08-10 DIAGNOSIS — R05 Cough: Secondary | ICD-10-CM | POA: Diagnosis present

## 2018-08-10 DIAGNOSIS — I2510 Atherosclerotic heart disease of native coronary artery without angina pectoris: Secondary | ICD-10-CM | POA: Diagnosis not present

## 2018-08-10 DIAGNOSIS — Z7901 Long term (current) use of anticoagulants: Secondary | ICD-10-CM | POA: Insufficient documentation

## 2018-08-10 DIAGNOSIS — E119 Type 2 diabetes mellitus without complications: Secondary | ICD-10-CM | POA: Insufficient documentation

## 2018-08-10 LAB — INFLUENZA PANEL BY PCR (TYPE A & B)
Influenza A By PCR: POSITIVE — AB
Influenza B By PCR: NEGATIVE

## 2018-08-10 MED ORDER — BENZONATATE 100 MG PO CAPS
100.0000 mg | ORAL_CAPSULE | Freq: Once | ORAL | Status: AC
  Administered 2018-08-10: 100 mg via ORAL
  Filled 2018-08-10: qty 1

## 2018-08-10 MED ORDER — OSELTAMIVIR PHOSPHATE 75 MG PO CAPS: 75.0000 mg | ORAL_CAPSULE | Freq: Two times a day (BID) | ORAL | 0 refills | Status: DC

## 2018-08-10 MED ORDER — OSELTAMIVIR PHOSPHATE 75 MG PO CAPS
75.0000 mg | ORAL_CAPSULE | Freq: Once | ORAL | Status: AC
  Administered 2018-08-10: 75 mg via ORAL
  Filled 2018-08-10: qty 1

## 2018-08-10 MED ORDER — ALBUTEROL SULFATE HFA 108 (90 BASE) MCG/ACT IN AERS
2.0000 | INHALATION_SPRAY | Freq: Once | RESPIRATORY_TRACT | Status: AC
  Administered 2018-08-10: 2 via RESPIRATORY_TRACT
  Filled 2018-08-10: qty 6.7

## 2018-08-10 MED ORDER — SODIUM CHLORIDE 0.9 % IV BOLUS
1000.0000 mL | Freq: Once | INTRAVENOUS | Status: AC
  Administered 2018-08-10: 1000 mL via INTRAVENOUS

## 2018-08-10 NOTE — ED Triage Notes (Addendum)
Patient states he has been coughing up blood bright red and pink that started today. Patient states he had a productive cough yesterday that was yellow/brown in color. Patient currently takes Elqiuis. Patient also c/o body aches that started 2 days ago.

## 2018-08-13 NOTE — ED Provider Notes (Signed)
[place] DEPT Provider Note   CSN: [id number] Arrival date & time: 08/10/18  1801     History   Chief Complaint Chief Complaint  Patient presents with  . Hemoptysis  . Generalized Body Aches    HPI [name] is a 53 y.o. male.  Productive cough for a few days then started having some blood tinged sputum recently. No frank hemoptysis.   The history is provided by the patient.  Cough  Cough characteristics:  Productive Sputum characteristics:  Green and bloody Severity:  Moderate Onset quality:  Gradual Duration:  3 days Timing:  Constant Progression:  Worsening Chronicity:  New Relieved by:  None tried Worsened by:  Nothing Ineffective treatments:  None tried   Past Medical History:  Diagnosis Date  . Aortic valve stenosis    a. Bicuspid AV - 2D Echo 06/2014 - mod AS, mild AI, mildly dilated aortic root.  [name] CAD (coronary artery disease)   . Carotid artery disease ([place])    a. Mild by duplex 05/2015 - 1-39% BICA. Repeat due 05/2017.  . Dilated aortic root ([place])    a. By echo 06/2014.  . Environmental allergies   . Hypercholesterolemia   . Hypertension   . Obesity (BMI 30-39.9) 04/26/2018  . Sleep apnea   . SVT (supraventricular tachycardia) ([place])    a. h/o SVT - Ablation done 2013.    Patient Active Problem List   Diagnosis Date Noted  . Abscess of left groin 07/23/2018  . Obesity (BMI 30-39.9) 04/26/2018  . History of colon polyps 07/05/2017  . Chronic anticoagulation 01/09/2017  . Low testosterone level in male 09/21/2016  . Persistent atrial fibrillation 05/23/2016  . Sleep apnea 05/11/2016  . Nasal congestion 08/26/2015  . Environmental allergies 07/22/2015  . Diabetes mellitus ([place]) 07/19/2015  . Essential hypertension 06/08/2014  . Hyperlipidemia 06/08/2014  . Aortic valve stenosis     Past Surgical History:  Procedure Laterality Date  . CARDIAC ELECTROPHYSIOLOGY STUDY AND ABLATION  08/21/2011  .  COLONOSCOPY WITH PROPOFOL N/A 06/26/2017   Procedure: COLONOSCOPY WITH PROPOFOL;  Surgeon: Manya Silvas, MD;  Location: [facility] ENDOSCOPY;  Service: Endoscopy;  Laterality: N/A;  . ESOPHAGOGASTRODUODENOSCOPY (EGD) WITH PROPOFOL N/A 06/26/2017   Procedure: ESOPHAGOGASTRODUODENOSCOPY (EGD) WITH PROPOFOL;  Surgeon: Manya Silvas, MD;  Location: [facility] ENDOSCOPY;  Service: Endoscopy;  Laterality: N/A;  . SEPTOPLASTY N/A 03/22/2015   Procedure: SEPTOPLASTY  WITH RIGHT INFERIOR TURBINATE REDUCTION;  Surgeon: Margaretha Sheffield, MD;  Location: [place];  Service: ENT;  Laterality: N/A;  . SUPRAVENTRICULAR TACHYCARDIA ABLATION N/A 08/21/2011   Procedure: SUPRAVENTRICULAR TACHYCARDIA ABLATION;  Surgeon: Evans Lance, MD;  Location: [facility] CATH LAB;  Service: Cardiovascular;  Laterality: N/A;  . surgery for non descending testicle    . TONSILLECTOMY          Home Medications    Prior to Admission medications   Medication Sig Start Date End Date Taking? Authorizing Provider  apixaban (ELIQUIS) 5 MG TABS tablet Take 1 tablet (5 mg total) by mouth 2 (two) times daily. 04/23/18  Yes [name], MD  atorvastatin (LIPITOR) 40 MG tablet TAKE 1 TABLET BY MOUTH EVERY DAY Patient taking differently: Take 40 mg by mouth daily.  05/31/18  Yes Einar Pheasant, MD  cetirizine (ZYRTEC) 10 MG tablet Take 10 mg by mouth daily.    Yes [provider]  fluticasone (FLONASE) 50 MCG/ACT nasal spray Place 2 sprays into both nostrils daily as needed for allergies  or rhinitis.   Yes [provider]  hydrochlorothiazide (MICROZIDE) 12.5 MG capsule TAKE 1 CAPSULE (12.5 MG TOTAL) BY MOUTH DAILY. 07/30/18  Yes [name], MD  lisinopril (PRINIVIL,ZESTRIL) 20 MG tablet TAKE ONE TABLET BY MOUTH ONCE DAILY Patient taking differently: Take 20 mg by mouth daily.  07/06/15  Yes [name], MD  metoprolol succinate (TOPROL-XL) 25 MG 24 hr tablet Take 1 tablet (25 mg total) by mouth daily. 10/22/17   Yes [name], MD  NON FORMULARY as directed. CPAP   Yes [provider]  [facility] DELICA LANCETS 19J MISC USE TWICE DAILY AS DIRECTED 11/02/17  Yes Einar Pheasant, MD  [facility] VERIO test strip USE TWICE DAILY AS DIRECTED 12/22/17  Yes Einar Pheasant, MD  oseltamivir (TAMIFLU) 75 MG capsule Take 1 capsule (75 mg total) by mouth every 12 (twelve) hours. 08/10/18   Briunna Leicht, Corene Cornea, MD    Family History Family History  Problem Relation Age of Onset  . Diabetes Other   . Cancer Other   . Breast cancer Maternal Grandmother   . Cancer Maternal Grandfather   . Aortic stenosis Maternal Grandfather     Social History Social History   Tobacco Use  . Smoking status: Former Smoker    Types: Cigarettes    Last attempt to quit: 11/04/1988    Years since quitting: 29.7  . Smokeless tobacco: Never Used  Substance Use Topics  . Alcohol use: Yes    Alcohol/week: 8.0 standard drinks    Types: 7 Glasses of wine, 1 Shots of liquor per week    Comment: occasionally  . Drug use: Not Currently    Comment: many years ago in highschool     Allergies   Tomato   Review of Systems Review of Systems  Respiratory: Positive for cough.   All other systems reviewed and are negative.    Physical Exam Updated Vital Signs BP 140/86   Pulse (!) 110   Temp 99.4 F (37.4 C) (Oral)   Resp 18   Ht 6\' 1"  (1.854 m)   Wt 126.6 kg   SpO2 96%   BMI 36.81 kg/m   Physical Exam Vitals signs and nursing note reviewed.  Constitutional:      Appearance: He is well-developed.  HENT:     Head: Normocephalic and atraumatic.     Mouth/Throat:     Mouth: Mucous membranes are moist.  Eyes:     Extraocular Movements: Extraocular movements intact.     Conjunctiva/sclera: Conjunctivae normal.     Pupils: Pupils are equal, round, and reactive to light.  Neck:     Musculoskeletal: Normal range of motion.  Cardiovascular:     Rate and Rhythm: Normal rate.  Pulmonary:     Effort: Pulmonary effort  is normal. No respiratory distress.  Abdominal:     General: There is no distension.  Musculoskeletal: Normal range of motion.  Skin:    General: Skin is warm and dry.  Neurological:     General: No focal deficit present.     Mental Status: He is alert.      ED Treatments / Results  Labs (all labs ordered are listed, but only abnormal results are displayed) Labs Reviewed  INFLUENZA PANEL BY PCR (TYPE A & B) - Abnormal; Notable for the following components:      Result Value   Influenza A By PCR POSITIVE (*)    All other components within normal limits    EKG None  Radiology No results found.  Procedures Procedures (including critical care time)  Medications Ordered in ED Medications  benzonatate (TESSALON) capsule 100 mg (100 mg Oral Given 08/10/18 2040)  albuterol (PROVENTIL HFA;VENTOLIN HFA) 108 (90 Base) MCG/ACT inhaler 2 puff (2 puffs Inhalation Given 08/10/18 2047)  sodium chloride 0.9 % bolus 1,000 mL (0 mLs Intravenous Stopped 08/10/18 2306)  oseltamivir (TAMIFLU) capsule 75 mg (75 mg Oral Given 08/10/18 2236)     Initial Impression / Assessment and Plan / ED Course  I have reviewed the triage vital signs and the nursing notes.  Pertinent labs & imaging results that were available during my care of the patient were reviewed by me and considered in my medical decision making (see chart for details).     Influenza. Appears well. No resp distress. Low suspicion for PE/infarct. Suspect blood streaking is from coughing so much. Stable for dc.   Final Clinical Impressions(s) / ED Diagnoses   Final diagnoses:  Influenza    ED Discharge Orders         Ordered    oseltamivir (TAMIFLU) 75 MG capsule  Every 12 hours     08/10/18 2259           Clete Kuch, Corene Cornea, MD 08/13/18 [id number]

## 2018-08-14 ENCOUNTER — Encounter: Payer: Self-pay | Admitting: Internal Medicine

## 2018-08-22 ENCOUNTER — Encounter: Payer: Self-pay | Admitting: Internal Medicine

## 2018-08-23 ENCOUNTER — Encounter: Payer: Self-pay | Admitting: Internal Medicine

## 2018-08-23 NOTE — Telephone Encounter (Signed)
LMTCB

## 2018-08-24 NOTE — Telephone Encounter (Signed)
Pt aware. Appt scheduled with Catie

## 2018-08-24 NOTE — Telephone Encounter (Signed)
Regarding exercise, he mentioned that he walks at work.  He needs to also set aside time to do formal exercise. Walking is a good exercise, but needs to set aside time (can start with 30 minutes several times per week) where it is continuous uninterrupted exercise.  Also, I read his other my chart message.  He needs to stay on his current blood pressure medications. The lisinopril helps to protect his kidneys against damage that diabetes can cause, etc.  I do not feel these are contributing to his elevated blood sugar.  Needs to watch his diet and exercise as above.  Can schedule him to see Catie if desires.

## 2018-08-30 ENCOUNTER — Other Ambulatory Visit: Payer: Self-pay | Admitting: Internal Medicine

## 2018-08-30 ENCOUNTER — Ambulatory Visit (INDEPENDENT_AMBULATORY_CARE_PROVIDER_SITE_OTHER): Payer: Managed Care, Other (non HMO) | Admitting: Pharmacist

## 2018-08-30 ENCOUNTER — Encounter: Payer: Self-pay | Admitting: Pharmacist

## 2018-08-30 VITALS — BP 130/88 | HR 82 | Wt 283.2 lb

## 2018-08-30 DIAGNOSIS — E119 Type 2 diabetes mellitus without complications: Secondary | ICD-10-CM

## 2018-08-30 DIAGNOSIS — I10 Essential (primary) hypertension: Secondary | ICD-10-CM | POA: Diagnosis not present

## 2018-08-30 DIAGNOSIS — E785 Hyperlipidemia, unspecified: Secondary | ICD-10-CM | POA: Diagnosis not present

## 2018-08-30 LAB — POCT GLYCOSYLATED HEMOGLOBIN (HGB A1C): Hemoglobin A1C: 7.4 % — AB (ref 4.0–5.6)

## 2018-08-30 MED ORDER — METFORMIN HCL ER 500 MG PO TB24: 1000.0000 mg | ORAL_TABLET | Freq: Every day | ORAL | 2 refills | Status: DC

## 2018-08-30 MED ORDER — LISINOPRIL 10 MG PO TABS: 10.0000 mg | ORAL_TABLET | Freq: Every day | ORAL | 2 refills | Status: DC

## 2018-08-30 NOTE — Assessment & Plan Note (Signed)
#  Hypertension currently uncontrolled and not optimally managed. Goal BP <130/80. Patient notes that he has been without lisinopril therapy for at least a few months. Appropriate to restart ACEi for nephroprotection in the setting of DM.  - Ordered lisinopril 10 mg daily, as BP is nearly at goal today in clinic. Could consider eventual discontinuation of HCTZ, if patient is concerned about its effect on glucose control - Continue metoprolol succinate 25 mg daily.  - Encouraged patient to check BP at home and bring readings to clinic.

## 2018-08-30 NOTE — Patient Instructions (Addendum)
It was great to meet you today!  1) Start metformin XR 500 mg, 2 tablets daily. You can take both tablets with the largest meal of your day, or take 1 tablet with breakfast/lunch and 1 with supper, whichever works best for you. This medication lasts a full 24 hours. If this is tolerated well and we need further blood sugar lowering later, we can increase the dose.  2) I am sending in a prescription for lisinopril 10 mg daily. Feel free to check your blood pressure at home and bring these results to future appointments.    Continue checking your blood sugar: 1) Fasting, before eating breakfast. The goal fasting blood sugar is less than 130 mg/dL 2) 2 hours after a meal. The goal 2-hour after meal blood sugar is less than 180 mg/dL  You are doing all of the right things with your meals - focus on maximizing protein to help better regulate blood sugars. Try to do moderate intensity physical activity (raising your heart rate or breathing hard enough that your ability to carry on a conversation is impacted) 100-150 minutes every week. This will also help with the concept of "insulin resistance" that we talked about.    I have sent a prescription for lisinopril 10 mg daily to the pharmacy, and Dr. Nicki Reaper refilled the atorvastatin earlier today.   Please feel free to contact clinic with any questions or concerns. You have an appointment with Dr. Nicki Reaper in about 3 weeks; after you see her, you two can decide when/if to come back and see me.   Catie Darnelle Maffucci, PharmD

## 2018-08-30 NOTE — Assessment & Plan Note (Signed)
#  ASCVD risk - primary prevention in patient aged 53-75 with DM; most recent LDL >200, patient had been off atorvastatin therapy for an unknown time period before this check.  - Continue atorvastatin 40 mg daily. Recommend lipid check with next blood work to evaluate if dose increase is needed.

## 2018-08-30 NOTE — Assessment & Plan Note (Signed)
#  Diabetes - Currently uncontrolled, most recent A1c 7.4% today, worsened from 6.9% on 05/15/2019; Goal A1c <7%, potentially <6.5%. Patient is correct, there are metabolic associations with statin and thiazide medications. However, I strongly believe the benefit of statin therapy in a patient with most recent LDL <190 and elevated ASCVD risk outweighs risk of increased blood sugars, as patient has a history of elevated A1c over 7% without statin therapy.  - Started metformin XR 1000 mg daily. Discussed mechanism of action, side effects. Discussed the concept of insulin resistance, and the benefit that could come from weight loss on blood sugar control. Titrate as needed moving forward.  - Discussed increasing exercise, targeting moderate intensity exercise for 100-150 minutes weekly and 2 days/week of resistance training (resistance bands, light hand weights). Patient plans to discuss appropriate exercise with Dr. Tamala Julian. - Encouraged to continue to checking fasting and 2 hour post prandial blood sugars.

## 2018-08-30 NOTE — Progress Notes (Signed)
S:     Chief Complaint  Patient presents with  . Medication Management    Diabetes    Patient arrives in good spirits. Presents for diabetes evaluation, education, and management at the request of Dr. Nicki Reaper (referred on 08/23/2018 in [place] message). Last seen by primary care provider on 05/14/2018.   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 He notes that he has done research, and knows that thiazides and statins can increase blood sugars.   Patient reports that he has "never been told he he has diabetes, just pre-diabetes".   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 Patient reports hyperglycemic symptoms including polyuria, polydipsia, polyphagia, nocturia, neuropathy, blurred vision.   Patient reported dietary habits: Eats 2-3 meals/day Breakfast: Sunday - fried bologna sandwich, egg; a lot of mornings just has a cup of coffee (w/ sweetener + creamer); some mornings doesn't have any coffee Lunch: Chicken, fish; sometimes brings food, sometimes done  Dinner: Sometimes skips dinner; Eats something at home Snacks: Sometimes fruit Drinks: Water; unsweet tea (no sweet tea, no soft drinks) Notes that he has tried keto, tried pescatarian; feels like nothing has really worked for him   Notes that he gains weight in the summer time (though  sweats and drinks)   Patient reported exercise habits: Some jogging but mostly walking (at work)    O:  Physical Exam Vitals signs reviewed.  Constitutional:      Appearance: Normal appearance.     Review of Systems  All other systems reviewed and are negative.    Lab Results  Component Value Date   HGBA1C 7.4 (A) 08/30/2018   Vitals:   08/30/18 1432  BP: 130/88  Pulse: 82    Basic Metabolic Panel BMP Latest Ref Rng & Units 05/14/2018 01/02/2017 08/06/2016  Glucose 70 - 99 mg/dL 133(H) 147(H) 104(H)  BUN 6 - 23 mg/dL 15 15 12   Creatinine 0.40 - 1.50 mg/dL 1.17 1.12 1.08  Sodium 135 - 145 mEq/L 142 141 141  Potassium 3.5 - 5.1 mEq/L 4.1 4.5 4.8  Chloride 96 - 112 mEq/L 103 104 107  CO2 19 - 32 mEq/L 26 30 29   Calcium 8.4 - 10.5 mg/dL 9.9 9.6 9.2     Lipid Panel     Component Value Date/Time   CHOL 289 (H) 05/14/2018 0951   TRIG 221.0 (H) 05/14/2018 0951   HDL 37.70 (L) 05/14/2018 0951   CHOLHDL 8 05/14/2018 0951   VLDL 44.2 (H) 05/14/2018 0951   LDLCALC 88 01/02/2017 0759   LDLDIRECT 228.0 05/14/2018 0951    Self-Monitored Blood Glucose Results Fasting SMBG: 100s-200s 2 hour post-prandial/random SMBG: 180s-200s  Clinical ASCVD: No  The 10-year ASCVD risk score [name], et al., 2013) is: 19.5%   Values used to calculate the score:     Age: 53 years     Sex: Male     Is Non-Hispanic African American: No  Diabetic: Yes     Tobacco smoker: No     Systolic Blood Pressure: 600 mmHg     Is BP treated: Yes     HDL Cholesterol: 37.7 mg/dL     Total Cholesterol: 289 mg/dL    A/P: #Diabetes - Currently uncontrolled, most recent A1c 7.4% today, worsened from 6.9% on 05/15/2019; Goal A1c <7%, potentially <6.5%. Patient is correct, there are metabolic associations with statin and thiazide medications. However, I strongly believe the benefit of statin therapy in a patient with most recent LDL <190 and elevated ASCVD risk outweighs risk of increased blood  sugars, as patient has a history of elevated A1c over 7% without statin therapy.  - Started metformin XR 1000 mg daily. Discussed mechanism of action, side effects. Discussed the concept of insulin resistance, and the benefit that could come from weight loss on blood sugar control. Titrate as needed moving forward.  - Discussed increasing exercise, targeting moderate intensity exercise for 100-150 minutes weekly and 2 days/week of resistance training (resistance bands, light hand weights). Patient plans to discuss appropriate exercise with Dr. Tamala Julian. - Encouraged to continue to checking fasting and 2 hour post prandial blood sugars.    #Hypertension currently uncontrolled and not optimally managed. Goal BP <130/80. Patient notes that he has been without lisinopril therapy for at least a few months. Appropriate to restart ACEi for nephroprotection in the setting of DM.  - Ordered lisinopril 10 mg daily, as BP is nearly at goal today in clinic. Could consider eventual discontinuation of HCTZ, if patient is concerned about its effect on glucose control - Continue metoprolol succinate 25 mg daily.  - Encouraged patient to check BP at home and bring readings to clinic.   #ASCVD risk - primary prevention in patient aged 62-75 with DM; most recent LDL >200, patient had been off atorvastatin therapy for an unknown time period before this check.  - Continue atorvastatin 40 mg daily. Recommend lipid check with next blood work to evaluate if dose increase is needed.    Written patient instructions provided.  Total time in face to face counseling 60 minutes.    Follow up in Pharmacist Clinic Visit 6-8 weeks (~4-6 weeks after next PCP visit).  De Hollingshead, PharmD, [place] PGY2 Ambulatory Care Pharmacy Resident Phone: [phone]

## 2018-09-14 ENCOUNTER — Other Ambulatory Visit: Payer: Self-pay | Admitting: Internal Medicine

## 2018-09-16 ENCOUNTER — Encounter: Payer: Self-pay | Admitting: Internal Medicine

## 2018-09-16 DIAGNOSIS — K766 Portal hypertension: Secondary | ICD-10-CM

## 2018-09-16 DIAGNOSIS — K3189 Other diseases of stomach and duodenum: Secondary | ICD-10-CM | POA: Insufficient documentation

## 2018-09-17 ENCOUNTER — Ambulatory Visit (INDEPENDENT_AMBULATORY_CARE_PROVIDER_SITE_OTHER): Payer: Managed Care, Other (non HMO) | Admitting: Internal Medicine

## 2018-09-17 ENCOUNTER — Encounter: Payer: Self-pay | Admitting: Internal Medicine

## 2018-09-17 ENCOUNTER — Other Ambulatory Visit: Payer: Self-pay

## 2018-09-17 VITALS — BP 134/82 | HR 68 | Temp 97.8°F | Resp 16 | Wt 292.2 lb

## 2018-09-17 DIAGNOSIS — G4733 Obstructive sleep apnea (adult) (pediatric): Secondary | ICD-10-CM

## 2018-09-17 DIAGNOSIS — Z23 Encounter for immunization: Secondary | ICD-10-CM

## 2018-09-17 DIAGNOSIS — I4891 Unspecified atrial fibrillation: Secondary | ICD-10-CM

## 2018-09-17 DIAGNOSIS — K3189 Other diseases of stomach and duodenum: Secondary | ICD-10-CM

## 2018-09-17 DIAGNOSIS — E1165 Type 2 diabetes mellitus with hyperglycemia: Secondary | ICD-10-CM | POA: Diagnosis not present

## 2018-09-17 DIAGNOSIS — I350 Nonrheumatic aortic (valve) stenosis: Secondary | ICD-10-CM

## 2018-09-17 DIAGNOSIS — I10 Essential (primary) hypertension: Secondary | ICD-10-CM | POA: Diagnosis not present

## 2018-09-17 DIAGNOSIS — E1169 Type 2 diabetes mellitus with other specified complication: Secondary | ICD-10-CM

## 2018-09-17 DIAGNOSIS — E785 Hyperlipidemia, unspecified: Secondary | ICD-10-CM | POA: Diagnosis not present

## 2018-09-17 DIAGNOSIS — E669 Obesity, unspecified: Secondary | ICD-10-CM

## 2018-09-17 DIAGNOSIS — K766 Portal hypertension: Secondary | ICD-10-CM

## 2018-09-17 DIAGNOSIS — E1159 Type 2 diabetes mellitus with other circulatory complications: Secondary | ICD-10-CM

## 2018-09-17 DIAGNOSIS — E119 Type 2 diabetes mellitus without complications: Secondary | ICD-10-CM

## 2018-09-17 NOTE — Progress Notes (Signed)
Patient ID: Dalton [name], male   DOB: [birth year], 53 y.o.   MRN: [id number]   Subjective:    Patient ID: Dalton [name], male    DOB: 03-Nov-1965, 53 y.o.   MRN: [id number]  HPI  Patient here for a scheduled follow up.  He reports he is doing well.  Feels good.  Has started walking.  Discussed low carb diet and exercise.  No chest pain.  No sob.  No acid reflux.  No abdominal pain.  Bowels moving.  Forgot his medication for a few days this week when out of town.  Had his eliquis.  Sugars a little higher then, but reports sugars mostly averaging 130-150.  Back on his medication and tries to take regularly.     Past Medical History:  Diagnosis Date  . Aortic valve stenosis    a. Bicuspid AV - 2D Echo 06/2014 - mod AS, mild AI, mildly dilated aortic root.  [name] CAD (coronary artery disease)   . Carotid artery disease ([place])    a. Mild by duplex 05/2015 - 1-39% BICA. Repeat due 05/2017.  . Dilated aortic root ([place])    a. By echo 06/2014.  . Environmental allergies   . Hypercholesterolemia   . Hypertension   . Obesity (BMI 30-39.9) 04/26/2018  . Sleep apnea   . SVT (supraventricular tachycardia) ([place])    a. h/o SVT - Ablation done 2013.   Past Surgical History:  Procedure Laterality Date  . CARDIAC ELECTROPHYSIOLOGY STUDY AND ABLATION  08/21/2011  . COLONOSCOPY WITH PROPOFOL N/A 06/26/2017   Procedure: COLONOSCOPY WITH PROPOFOL;  Surgeon: Manya Silvas, MD;  Location[facility] ENDOSCOPY;  Service: Endoscopy;  Laterality: N/A;  . ESOPHAGOGASTRODUODENOSCOPY (EGD) WITH PROPOFOL N/A 06/26/2017   Procedure: ESOPHAGOGASTRODUODENOSCOPY (EGD) WITH PROPOFOL;  Surgeon: Manya Silvas, MD;  Location: [facility] ENDOSCOPY;  Service: Endoscopy;  Laterality: N/A;  . SEPTOPLASTY N/A 03/22/2015   Procedure: SEPTOPLASTY  WITH RIGHT INFERIOR TURBINATE REDUCTION;  Surgeon: Margaretha Sheffield, MD;  Location: [facility];  Service: ENT;  Laterality: N/A;  . SUPRAVENTRICULAR TACHYCARDIA ABLATION N/A 08/21/2011    Procedure: SUPRAVENTRICULAR TACHYCARDIA ABLATION;  Surgeon: Evans Lance, MD;  Location: [facility] CATH LAB;  Service: Cardiovascular;  Laterality: N/A;  . surgery for non descending testicle    . TONSILLECTOMY     Family History  Problem Relation Age of Onset  . Diabetes Other   . Cancer Other   . Breast cancer Maternal Grandmother   . Cancer Maternal Grandfather   . Aortic stenosis Maternal Grandfather    Social History   Socioeconomic History  . Marital status: Married    Spouse name: Not on file  . Number of children: 2  . Years of education: Not on file  . Highest education level: Not on file  Occupational History  . Occupation: truck Education administrator: Insurance account manager  Social Needs  . Financial resource strain: Not on file  . Food insecurity:    Worry: Not on file    Inability: Not on file  . Transportation needs:    Medical: Not on file    Non-medical: Not on file  Tobacco Use  . Smoking status: Former Smoker    Types: Cigarettes    Last attempt to quit: 11/04/1988    Years since quitting: 29.8  . Smokeless tobacco: Never Used  Substance and Sexual Activity  . Alcohol use: Yes    Alcohol/week: 8.0 standard drinks    Types: 7 Glasses of wine,  1 Shots of liquor per week    Comment: occasionally  . Drug use: Not Currently    Comment: many years ago in highschool  . Sexual activity: Not Currently  Lifestyle  . Physical activity:    Days per week: Not on file    Minutes per session: Not on file  . Stress: Not on file  Relationships  . Social connections:    Talks on phone: Not on file    Gets together: Not on file    Attends religious service: Not on file    Active member of club or organization: Not on file    Attends meetings of clubs or organizations: Not on file    Relationship status: Not on file  Other Topics Concern  . Not on file  Social History Narrative   Very rare exercise    Outpatient Encounter Medications as of 09/17/2018  Medication Sig  .  apixaban (ELIQUIS) 5 MG TABS tablet Take 1 tablet (5 mg total) by mouth 2 (two) times daily.  [name] atorvastatin (LIPITOR) 40 MG tablet TAKE 1 TABLET BY MOUTH EVERY DAY  . cetirizine (ZYRTEC) 10 MG tablet Take 10 mg by mouth daily.   . fluticasone (FLONASE) 50 MCG/ACT nasal spray Place 2 sprays into both nostrils daily as needed for allergies or rhinitis.  . hydrochlorothiazide (MICROZIDE) 12.5 MG capsule TAKE 1 CAPSULE (12.5 MG TOTAL) BY MOUTH DAILY.  [name] Lancets (ONETOUCH DELICA PLUS [id number]) MISC USE TWICE DAILY AS DIRECTED  . lisinopril (PRINIVIL,ZESTRIL) 10 MG tablet Take 1 tablet (10 mg total) by mouth daily.  . metFORMIN (GLUCOPHAGE XR) 500 MG 24 hr tablet Take 2 tablets (1,000 mg total) by mouth daily.  . metoprolol succinate (TOPROL-XL) 25 MG 24 hr tablet Take 1 tablet (25 mg total) by mouth daily.  . NON FORMULARY as directed. CPAP  . ONETOUCH VERIO test strip USE TWICE DAILY AS DIRECTED   No facility-administered encounter medications on file as of 09/17/2018.     Review of Systems  Constitutional: Negative for appetite change and unexpected weight change.  HENT: Negative for congestion and sinus pressure.   Respiratory: Negative for cough, chest tightness and shortness of breath.   Cardiovascular: Negative for chest pain, palpitations and leg swelling.  Gastrointestinal: Negative for abdominal pain, diarrhea, nausea and vomiting.  Genitourinary: Negative for difficulty urinating and dysuria.  Musculoskeletal: Negative for joint swelling and myalgias.  Skin: Negative for color change and rash.  Neurological: Negative for dizziness, light-headedness and headaches.  Psychiatric/Behavioral: Negative for agitation and dysphoric mood.       Objective:    Physical Exam Constitutional:      General: He is not in acute distress.    Appearance: Normal appearance. He is well-developed.  HENT:     Nose: Nose normal. No congestion.     Mouth/Throat:     Pharynx: No oropharyngeal  exudate or posterior oropharyngeal erythema.  Cardiovascular:     Rate and Rhythm: Normal rate.     Comments: Rate controlled.  Pulmonary:     Effort: Pulmonary effort is normal. No respiratory distress.     Breath sounds: Normal breath sounds.  Abdominal:     General: Bowel sounds are normal.     Palpations: Abdomen is soft.     Tenderness: There is no abdominal tenderness.  Musculoskeletal:        General: No swelling or tenderness.  Skin:    Findings: No erythema or rash.  Neurological:     Mental  Status: He is alert.  Psychiatric:        Mood and Affect: Mood normal.        Behavior: Behavior normal.     BP 134/82   Pulse 68   Temp 97.8 F (36.6 C) (Oral)   Resp 16   Wt 292 lb 3.2 oz (132.5 kg)   SpO2 97%   BMI 38.55 kg/m  Wt Readings from Last 3 Encounters:  09/17/18 292 lb 3.2 oz (132.5 kg)  08/30/18 283 lb 3.2 oz (128.5 kg)  08/10/18 279 lb (126.6 kg)     Lab Results  Component Value Date   WBC 6.0 05/14/2018   HGB 16.6 05/14/2018   HCT 48.5 05/14/2018   PLT 186.0 05/14/2018   GLUCOSE 133 (H) 05/14/2018   CHOL 289 (H) 05/14/2018   TRIG 221.0 (H) 05/14/2018   HDL 37.70 (L) 05/14/2018   LDLDIRECT 228.0 05/14/2018   LDLCALC 88 01/02/2017   ALT 35 05/14/2018   AST 19 05/14/2018   NA 142 05/14/2018   K 4.1 05/14/2018   CL 103 05/14/2018   CREATININE 1.17 05/14/2018   BUN 15 05/14/2018   CO2 26 05/14/2018   TSH 1.56 05/14/2018   PSA 0.72 05/14/2018   INR 1.0 08/14/2011   HGBA1C 7.4 (A) 08/30/2018   MICROALBUR 0.7 05/14/2018    Dg Chest 2 View  Result Date: 08/10/2018 CLINICAL DATA:  Hemoptysis, cough EXAM: CHEST - 2 VIEW COMPARISON:  04/11/2010 FINDINGS: There is chronic elevation of the left diaphragm. There is no focal consolidation. There is no pleural effusion or pneumothorax. The heart and mediastinal contours are unremarkable. The osseous structures are unremarkable. IMPRESSION: No active cardiopulmonary disease. Electronically Signed   By:  Kathreen Devoid   On: 08/10/2018 19:26       Assessment & Plan:   Problem List Items Addressed This Visit    Aortic valve stenosis    Followed by cardiology.  On eliquis.  Stable.        Atrial fibrillation ([place])    On eliquis.  Followed by cardiology.  Stable.       RESOLVED: Diabetes mellitus ([place])    On metformin.  Sugars as outlined.  Discussed low carb diet and exercise.  He has started walking.  Follow met b and a1c.       Relevant Orders   Hemoglobin A1c   Essential hypertension    Blood pressure under good control.  Continue same medication regimen.  Follow pressures.  Follow metabolic panel.        Relevant Orders   Basic metabolic panel   Hyperlipidemia    On lipitor.  Low cholesterol diet and exercise.  Follow lipid panel and liver function tests.        Relevant Orders   Hepatic function panel   Lipid panel   Obesity, diabetes, and hypertension syndrome ([place])    He has started exercising.  Discussed diet.  Follow.       Portal hypertensive gastropathy ([facility])    Stable.  Saw GI.  EGD as outlined in overview.       Sleep apnea    Uses CPAP regularly.        Other Visit Diagnoses    Needs flu shot    -  Primary   Relevant Orders   Flu Vaccine QUAD 6+ mos PF IM (Fluarix Quad PF) (Completed)       Einar Pheasant, MD

## 2018-09-19 ENCOUNTER — Encounter: Payer: Self-pay | Admitting: Internal Medicine

## 2018-09-19 NOTE — Assessment & Plan Note (Signed)
Blood pressure under good control.  Continue same medication regimen.  Follow pressures.  Follow metabolic panel.   

## 2018-09-19 NOTE — Assessment & Plan Note (Signed)
On metformin.  Sugars as outlined.  Discussed low carb diet and exercise.  He has started walking.  Follow met b and a1c.

## 2018-09-19 NOTE — Assessment & Plan Note (Signed)
Stable.  Saw GI.  EGD as outlined in overview.

## 2018-09-19 NOTE — Assessment & Plan Note (Signed)
On lipitor.  Low cholesterol diet and exercise.  Follow lipid panel and liver function tests.   

## 2018-09-19 NOTE — Assessment & Plan Note (Signed)
He has started exercising.  Discussed diet.  Follow.

## 2018-09-19 NOTE — Assessment & Plan Note (Signed)
Followed by cardiology.  On eliquis.  Stable.   

## 2018-09-19 NOTE — Assessment & Plan Note (Signed)
Uses CPAP regularly °

## 2018-09-19 NOTE — Assessment & Plan Note (Signed)
On eliquis.  Followed by cardiology.  Stable.  °

## 2018-09-20 NOTE — Progress Notes (Signed)
I have reviewed the above note and agree. I was available to the pharmacist for consultation.  Kyler Germer, MD  

## 2018-09-28 ENCOUNTER — Telehealth: Payer: Self-pay

## 2018-09-28 NOTE — Telephone Encounter (Signed)
Copied from [place] [id number]. Topic: General - Inquiry >> Sep 28, 2018  9:13 AM Virl Axe D wrote: Reason for CRM: Pt's wife Butch Penny would like to know if pt's lab appointment scheduled for 10/01/18 is essential or if it can be scheduled for a later date due to Covid-19 they do not want to have pt come into the office unless absolutely necessary. Please advise. [phone]

## 2018-09-28 NOTE — Telephone Encounter (Signed)
a1c was checked by Catie.  We had him scheduled to check other labs.  Ok to postpone labs.

## 2018-09-28 NOTE — Telephone Encounter (Signed)
Pt is scheduled for fasting labs. He just had an A1C done on 08/30/18. Are you ok with postponing his labs?

## 2018-09-29 ENCOUNTER — Encounter: Payer: Self-pay | Admitting: Internal Medicine

## 2018-09-30 NOTE — Telephone Encounter (Signed)
See mychart.  

## 2018-10-01 ENCOUNTER — Other Ambulatory Visit: Payer: Managed Care, Other (non HMO)

## 2018-10-20 ENCOUNTER — Telehealth: Payer: Self-pay | Admitting: Interventional Cardiology

## 2018-10-20 MED ORDER — APIXABAN 5 MG PO TABS: 5.0000 mg | ORAL_TABLET | Freq: Two times a day (BID) | ORAL | 1 refills | Status: DC

## 2018-10-20 NOTE — Telephone Encounter (Signed)
Called pt and spoke with pt's wife informing her that I was leaving a $10 co-pay card downstairs for pt to pick up and just activate it and take it to the pharmacy to be able to get the $10 discount and if they have any other problems, questions or concerns to give our office a call back.

## 2018-10-20 NOTE — Addendum Note (Signed)
Addended by: Derl Barrow on: 10/20/2018 11:39 AM   Modules accepted: Orders

## 2018-11-01 ENCOUNTER — Ambulatory Visit: Payer: Managed Care, Other (non HMO)

## 2018-11-01 ENCOUNTER — Ambulatory Visit: Payer: Managed Care, Other (non HMO) | Admitting: Pharmacist

## 2018-11-04 ENCOUNTER — Telehealth: Payer: Self-pay | Admitting: Interventional Cardiology

## 2018-11-04 NOTE — Telephone Encounter (Signed)
Patient uses a c-pap machine and is still waking up very tired and still not sleeping well. Wife says that he stops breathing during the middle of the night and he's still snoring.  He wants to know what to do at this point.  He thinks that he may need to be referred back to Dr. Radford Pax. Please call patient.

## 2018-11-04 NOTE — Telephone Encounter (Signed)
Has seen Dr. Radford Pax in the past for sleep.  Will route to Dr. Theodosia Blender nurse.

## 2018-11-05 NOTE — Telephone Encounter (Signed)
Left message to call back  

## 2018-11-05 NOTE — Telephone Encounter (Signed)
Needs to be set up for video visit

## 2018-11-08 ENCOUNTER — Telehealth: Payer: Managed Care, Other (non HMO) | Admitting: Cardiology

## 2018-11-11 NOTE — Telephone Encounter (Signed)
Left a message to call back.

## 2018-11-23 ENCOUNTER — Other Ambulatory Visit: Payer: Self-pay | Admitting: Interventional Cardiology

## 2018-11-23 ENCOUNTER — Other Ambulatory Visit: Payer: Self-pay | Admitting: Internal Medicine

## 2018-11-23 DIAGNOSIS — I10 Essential (primary) hypertension: Secondary | ICD-10-CM

## 2018-11-30 ENCOUNTER — Encounter: Payer: Self-pay | Admitting: Internal Medicine

## 2018-12-01 NOTE — Telephone Encounter (Signed)
Confirmed pt doing okay. He is having burning in his stomach after he eats and feels bloated. Not every time he eats. Has become more frequent. Scheduled pt for tomorrow to discuss medication options with PCP. He has not tried anything OTC. Did not want to take anything until discussed with Dr. Nicki Reaper

## 2018-12-02 ENCOUNTER — Ambulatory Visit (INDEPENDENT_AMBULATORY_CARE_PROVIDER_SITE_OTHER): Payer: Managed Care, Other (non HMO) | Admitting: Internal Medicine

## 2018-12-02 ENCOUNTER — Other Ambulatory Visit: Payer: Self-pay

## 2018-12-02 ENCOUNTER — Encounter: Payer: Self-pay | Admitting: Internal Medicine

## 2018-12-02 DIAGNOSIS — R109 Unspecified abdominal pain: Secondary | ICD-10-CM

## 2018-12-02 DIAGNOSIS — E785 Hyperlipidemia, unspecified: Secondary | ICD-10-CM | POA: Diagnosis not present

## 2018-12-02 DIAGNOSIS — K766 Portal hypertension: Secondary | ICD-10-CM

## 2018-12-02 DIAGNOSIS — I10 Essential (primary) hypertension: Secondary | ICD-10-CM | POA: Diagnosis not present

## 2018-12-02 DIAGNOSIS — K3189 Other diseases of stomach and duodenum: Secondary | ICD-10-CM

## 2018-12-02 DIAGNOSIS — I4891 Unspecified atrial fibrillation: Secondary | ICD-10-CM

## 2018-12-02 DIAGNOSIS — E1165 Type 2 diabetes mellitus with hyperglycemia: Secondary | ICD-10-CM

## 2018-12-02 NOTE — Progress Notes (Signed)
Patient ID: Dalton [name], male   DOB: 02-Sep-1965, 53 y.o.   MRN: [id number]   Virtual Visit via telephone Note  This visit type was conducted due to national recommendations for restrictions regarding the COVID-19 pandemic (e.g. social distancing).  This format is felt to be most appropriate for this patient at this time.  All issues noted in this document were discussed and addressed.  No physical exam was performed (except for noted visual exam findings with Video Visits).   I connected with [name] by telephone and verified that I am speaking with the correct person using two identifiers. Location patient: home Location provider: work Persons participating in the telephone visit: patient, provider  I discussed the limitations, risks, security and privacy concerns of performing an evaluation and management service by telephone and the availability of in person appointments. The patient expressed understanding and agreed to proceed.   Reason for visit: acute visit  HPI: Work in appt with concerns regarding stomach pain, bloating and burning in his stomach. States he notices symptoms after eating.  Increased burning - after eats.  Does not notice symptoms at other times.  Increased gas.  May have 2-3 bowel movements per day.  No fever.  Trying to stay in due to COVID restrictions.  No chest pain, sob, cough or chest congestion.  No urinary symptoms.  No blood in stool.  He is eating.  No weight loss.  Taking metformin XR.  Reports sugars averaging 160-190.  Taking medication regularly.  Using cpap.     ROS: See pertinent positives and negatives per HPI.  Past Medical History:  Diagnosis Date  . Aortic valve stenosis    a. Bicuspid AV - 2D Echo 06/2014 - mod AS, mild AI, mildly dilated aortic root.  [name] CAD (coronary artery disease)   . Carotid artery disease ([place])    a. Mild by duplex 05/2015 - 1-39% BICA. Repeat due 05/2017.  . Dilated aortic root ([place])    a. By echo 06/2014.   . Environmental allergies   . Hypercholesterolemia   . Hypertension   . Obesity (BMI 30-39.9) 04/26/2018  . Sleep apnea   . SVT (supraventricular tachycardia) ([place])    a. h/o SVT - Ablation done 2013.    Past Surgical History:  Procedure Laterality Date  . CARDIAC ELECTROPHYSIOLOGY STUDY AND ABLATION  08/21/2011  . COLONOSCOPY WITH PROPOFOL N/A 06/26/2017   Procedure: COLONOSCOPY WITH PROPOFOL;  Surgeon: Manya Silvas, MD;  [facility]COPY;  Service: Endoscopy;  Laterality: N/A;  . ESOPHAGOGASTRODUODENOSCOPY (EGD) WITH PROPOFOL N/A 06/26/2017   Procedure: ESOPHAGOGASTRODUODENOSCOPY (EGD) WITH PROPOFOL;  Surgeon: Manya Silvas, MD;  Location: [facility] ENDOSCOPY;  Service: Endoscopy;  Laterality: N/A;  . SEPTOPLASTY N/A 03/22/2015   Procedure: SEPTOPLASTY  WITH RIGHT INFERIOR TURBINATE REDUCTION;  Surgeon: Margaretha Sheffield, MD;  Location: [place];  Service: ENT;  Laterality: N/A;  . SUPRAVENTRICULAR TACHYCARDIA ABLATION N/A 08/21/2011   Procedure: SUPRAVENTRICULAR TACHYCARDIA ABLATION;  Surgeon: Evans Lance, MD;  Location: [facility] CATH LAB;  Service: Cardiovascular;  Laterality: N/A;  . surgery for non descending testicle    . TONSILLECTOMY      Family History  Problem Relation Age of Onset  . Diabetes Other   . Cancer Other   . Breast cancer Maternal Grandmother   . Cancer Maternal Grandfather   . Aortic stenosis Maternal Grandfather     SOCIAL HX: reviewed.    Current Outpatient Medications:  .  apixaban (ELIQUIS) 5 MG TABS  tablet, Take 1 tablet (5 mg total) by mouth 2 (two) times daily., Disp: 180 tablet, Rfl: 1 .  atorvastatin (LIPITOR) 40 MG tablet, TAKE 1 TABLET BY MOUTH EVERY DAY, Disp: 30 tablet, Rfl: 2 .  cetirizine (ZYRTEC) 10 MG tablet, Take 10 mg by mouth daily. , Disp: , Rfl:  .  fluticasone (FLONASE) 50 MCG/ACT nasal spray, Place 2 sprays into both nostrils daily as needed for allergies or rhinitis., Disp: , Rfl:  .  hydrochlorothiazide  (MICROZIDE) 12.5 MG capsule, TAKE 1 CAPSULE (12.5 MG TOTAL) BY MOUTH DAILY., Disp: 30 capsule, Rfl: 9 .  Lancets (ONETOUCH DELICA PLUS [id number]) MISC, USE TWICE DAILY AS DIRECTED, Disp: 100 each, Rfl: 7 .  lisinopril (ZESTRIL) 10 MG tablet, TAKE 1 TABLET BY MOUTH EVERY DAY, Disp: 30 tablet, Rfl: 2 .  metFORMIN (GLUCOPHAGE-XR) 500 MG 24 hr tablet, TAKE 2 TABLETS BY MOUTH EVERY DAY, Disp: 60 tablet, Rfl: 2 .  metoprolol succinate (TOPROL-XL) 25 MG 24 hr tablet, TAKE 1 TABLET (25 MG TOTAL) BY MOUTH DAILY., Disp: 30 tablet, Rfl: 11 .  NON FORMULARY, as directed. CPAP, Disp: , Rfl:  .  ONETOUCH VERIO test strip, USE TWICE DAILY AS DIRECTED, Disp: 100 each, Rfl: 15  EXAM:  GENERAL: alert, oriented, appears well and in no acute distress  HEENT: atraumatic, conjunttiva clear, no obvious abnormalities on inspection of external nose and ears  NECK: normal movements of the head and neck  LUNGS: on inspection no signs of respiratory distress, breathing rate appears normal, no obvious gross SOB, gasping or wheezing  CV: no obvious cyanosis  PSYCH/NEURO: pleasant and cooperative, no obvious depression or anxiety, speech and thought processing grossly intact  ASSESSMENT AND PLAN:  Discussed the following assessment and plan:  Abdominal pain, unspecified abdominal location - Plan: US Abdomen Complete  Atrial fibrillation, unspecified type ([place])  Essential hypertension  Hyperlipidemia, unspecified hyperlipidemia type  Portal hypertensive gastropathy (HCC)  Type 2 diabetes mellitus with hyperglycemia, without long-term current use of insulin (HCC)  Atrial fibrillation ([place]) Followed by cardiology.  On eliquis.  Stable.    Essential hypertension Blood pressure has been under good control.  Follow pressures.  Follow metabolic panel.   Hyperlipidemia On lipitor.  Low cholesterol diet and exercise.  Follow lipid panel and liver function tests.    Portal hypertensive gastropathy ([place]) Found  on EGD 06/2017.  Treat symptoms - PPI.  Obtain abdominal ultrasound.  Discussed possible referral back to GI.    Abdominal pain With burning after eating.  Increased stomach pain and bloating.  Symptoms after eating.  No vomiting.  Start pepcid 20mg  q day.  Avoid foods that aggravate and avoid eating late.  Check abdominal ultrasound.  Discussed possible GI referral.  Update me regarding symtoms.    Type 2 diabetes mellitus with hyperglycemia (HCC) Low carb diet and exercise.  On metformin.  Sugars as outlined.  Follow.      I discussed the assessment and treatment plan with the patient. The patient was provided an opportunity to ask questions and all were answered. The patient agreed with the plan and demonstrated an understanding of the instructions.   The patient was advised to call back or seek an in-person evaluation if the symptoms worsen or if the condition fails to improve as anticipated.    Einar Pheasant, MD

## 2018-12-03 ENCOUNTER — Telehealth: Payer: Self-pay

## 2018-12-03 DIAGNOSIS — G4750 Parasomnia, unspecified: Secondary | ICD-10-CM

## 2018-12-03 NOTE — Telephone Encounter (Signed)
Left a message for the patient, to call back.

## 2018-12-03 NOTE — Telephone Encounter (Signed)
-----   Message from Sueanne Margarita, MD sent at 11/22/2018 10:40 AM EDT ----- His AHI is 3.2 on auto PAP with 100% compliance - no changes needed  Traci ----- Message ----- From: Sarina Ill, RN Sent: 11/22/2018  10:29 AM EDT To: Sueanne Margarita, MD  You saw this patient back in October, we were unable to see his sleep report at that time so you were waiting on it to make changes. He is in [place].   Suezanne Jacquet

## 2018-12-05 ENCOUNTER — Encounter: Payer: Self-pay | Admitting: Internal Medicine

## 2018-12-05 DIAGNOSIS — R109 Unspecified abdominal pain: Secondary | ICD-10-CM | POA: Insufficient documentation

## 2018-12-05 DIAGNOSIS — E1165 Type 2 diabetes mellitus with hyperglycemia: Secondary | ICD-10-CM | POA: Insufficient documentation

## 2018-12-05 NOTE — Assessment & Plan Note (Signed)
With burning after eating.  Increased stomach pain and bloating.  Symptoms after eating.  No vomiting.  Start pepcid 20mg  q day.  Avoid foods that aggravate and avoid eating late.  Check abdominal ultrasound.  Discussed possible GI referral.  Update me regarding symtoms.

## 2018-12-05 NOTE — Assessment & Plan Note (Signed)
On lipitor.  Low cholesterol diet and exercise.  Follow lipid panel and liver function tests.   

## 2018-12-05 NOTE — Assessment & Plan Note (Signed)
Followed by cardiology.  On eliquis.  Stable.   

## 2018-12-05 NOTE — Assessment & Plan Note (Signed)
Blood pressure has been under good control.  Follow pressures.  Follow metabolic panel.  

## 2018-12-05 NOTE — Assessment & Plan Note (Signed)
Low carb diet and exercise.  On metformin.  Sugars as outlined.  Follow.

## 2018-12-05 NOTE — Assessment & Plan Note (Signed)
Found on EGD 06/2017.  Treat symptoms - PPI.  Obtain abdominal ultrasound.  Discussed possible referral back to GI.

## 2018-12-10 ENCOUNTER — Other Ambulatory Visit: Payer: Self-pay

## 2018-12-10 ENCOUNTER — Ambulatory Visit (HOSPITAL_COMMUNITY)
Admission: RE | Admit: 2018-12-10 | Discharge: 2018-12-10 | Disposition: A | Discharge status: Home / Self Care | Payer: Managed Care, Other (non HMO) | Source: Ambulatory Visit | Attending: Internal Medicine | Admitting: Internal Medicine

## 2018-12-10 DIAGNOSIS — R109 Unspecified abdominal pain: Secondary | ICD-10-CM | POA: Diagnosis not present

## 2018-12-15 NOTE — Telephone Encounter (Signed)
Left message on home phone to call back.

## 2018-12-17 NOTE — Telephone Encounter (Signed)
His AHI is fine ? Whether he is having some type of parasomnia with the very restless sleep at night.  Do not think it is related to OSA.  Please refer to Dr. Maia Petties with Neurology to assess for parasomnias

## 2018-12-17 NOTE — Telephone Encounter (Signed)
Spoke with the patient and his wife, they agreed to the referral.

## 2018-12-17 NOTE — Telephone Encounter (Signed)
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 His wife stated that he is still having problems with his device. He started to snore again and is extremely restless at night. He has not had any stop breathing incidents, but his breathing does slow multiple times a night. The wife wants to know if there are any changes, because he is not sleeping soundly and he is extremely tired all the time.

## 2018-12-17 NOTE — Telephone Encounter (Signed)
Patient is returning your call.  

## 2018-12-17 NOTE — Addendum Note (Signed)
Addended by: Sarina Ill on: 12/17/2018 05:24 PM   Modules accepted: Orders

## 2019-01-11 ENCOUNTER — Other Ambulatory Visit: Payer: Self-pay | Admitting: Internal Medicine

## 2019-01-13 ENCOUNTER — Encounter: Payer: Self-pay | Admitting: Neurology

## 2019-01-13 ENCOUNTER — Institutional Professional Consult (permissible substitution): Payer: Managed Care, Other (non HMO) | Admitting: Neurology

## 2019-01-13 ENCOUNTER — Ambulatory Visit (INDEPENDENT_AMBULATORY_CARE_PROVIDER_SITE_OTHER): Payer: Managed Care, Other (non HMO) | Admitting: Internal Medicine

## 2019-01-13 ENCOUNTER — Telehealth: Payer: Self-pay | Admitting: Internal Medicine

## 2019-01-13 ENCOUNTER — Ambulatory Visit (INDEPENDENT_AMBULATORY_CARE_PROVIDER_SITE_OTHER): Payer: Managed Care, Other (non HMO) | Admitting: Neurology

## 2019-01-13 ENCOUNTER — Other Ambulatory Visit: Payer: Self-pay

## 2019-01-13 VITALS — BP 137/97 | HR 85 | Temp 98.2°F | Ht 73.0 in | Wt 294.0 lb

## 2019-01-13 DIAGNOSIS — E785 Hyperlipidemia, unspecified: Secondary | ICD-10-CM

## 2019-01-13 DIAGNOSIS — R109 Unspecified abdominal pain: Secondary | ICD-10-CM

## 2019-01-13 DIAGNOSIS — I4891 Unspecified atrial fibrillation: Secondary | ICD-10-CM

## 2019-01-13 DIAGNOSIS — Z9109 Other allergy status, other than to drugs and biological substances: Secondary | ICD-10-CM

## 2019-01-13 DIAGNOSIS — I10 Essential (primary) hypertension: Secondary | ICD-10-CM | POA: Diagnosis not present

## 2019-01-13 DIAGNOSIS — I350 Nonrheumatic aortic (valve) stenosis: Secondary | ICD-10-CM

## 2019-01-13 DIAGNOSIS — Z9989 Dependence on other enabling machines and devices: Secondary | ICD-10-CM

## 2019-01-13 DIAGNOSIS — G4733 Obstructive sleep apnea (adult) (pediatric): Secondary | ICD-10-CM

## 2019-01-13 DIAGNOSIS — R0981 Nasal congestion: Secondary | ICD-10-CM

## 2019-01-13 DIAGNOSIS — E1165 Type 2 diabetes mellitus with hyperglycemia: Secondary | ICD-10-CM

## 2019-01-13 MED ORDER — ATORVASTATIN CALCIUM 40 MG PO TABS: 40.0000 mg | ORAL_TABLET | Freq: Every day | ORAL | 2 refills | Status: DC

## 2019-01-13 MED ORDER — METFORMIN HCL 500 MG PO TABS: 500.0000 mg | ORAL_TABLET | Freq: Two times a day (BID) | ORAL | 1 refills | Status: DC

## 2019-01-13 MED ORDER — LISINOPRIL 10 MG PO TABS: 10.0000 mg | ORAL_TABLET | Freq: Every day | ORAL | 2 refills | Status: DC

## 2019-01-13 NOTE — Telephone Encounter (Signed)
Clarification given to pharmacist.

## 2019-01-13 NOTE — Telephone Encounter (Signed)
Pharmacy called to confirm that pt's metFORMIN (GLUCOPHAGE) 500 MG tablet is the right one. They stated he has been on the time release and wanted to confirm before filling. Please advise.  [id number] [address], [place]. [phone] (Phone) [phone] (Fax)
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
## 2019-01-13 NOTE — Progress Notes (Signed)
Patient ID: Dalton [name], male   DOB: 1965/11/28, 53 y.o.   MRN: [id number]   Virtual Visit via telephone Note  This visit type was conducted due to national recommendations for restrictions regarding the COVID-19 pandemic (e.g. social distancing).  This format is felt to be most appropriate for this patient at this time.  All issues noted in this document were discussed and addressed.  No physical exam was performed (except for noted visual exam findings with Video Visits).   I connected with [name] by telephone and verified that I am speaking with the correct person using two identifiers. Location patient: home Location provider: work  Persons participating in the visit: patient, provider  I discussed the limitations, risks, security and privacy concerns of performing an evaluation and management service by telephone and the availability of in person appointments. The patient expressed understanding and agreed to proceed.   Reason for visit: scheduled follow up.    HPI: He was worked in 12/02/18 for abdominal bloating and burning sensation.  See last note. Never started on protonix.  Symptoms resolved.  No abdominal pain now.  No nausea or vomiting.  Eating.  No chest pain.  Breathing stable.  Recent abdominal ultrasound revealed fatty liver.  Discussed diet and exercise.  AM sugars averaging 130-140s and pm sugars 170-180.  States blood pressures doing well - averaging 130s/81-82.  Sees cardiology.  Stable. On eliquis.     ROS: See pertinent positives and negatives per HPI.  Past Medical History:  Diagnosis Date  . Aortic valve stenosis    a. Bicuspid AV - 2D Echo 06/2014 - mod AS, mild AI, mildly dilated aortic root.  [name] CAD (coronary artery disease)   . Carotid artery disease ([place])    a. Mild by duplex 05/2015 - 1-39% BICA. Repeat due 05/2017.  . Dilated aortic root ([place])    a. By echo 06/2014.  . Environmental allergies   . Hypercholesterolemia   . Hypertension   .  Obesity (BMI 30-39.9) 04/26/2018  . Sleep apnea   . SVT (supraventricular tachycardia) ([place])    a. h/o SVT - Ablation done 2013.    Past Surgical History:  Procedure Laterality Date  . CARDIAC ELECTROPHYSIOLOGY STUDY AND ABLATION  08/21/2011  . COLONOSCOPY WITH PROPOFOL N/A 06/26/2017   Procedure: COLONOSCOPY WITH PROPOFOL;  Surgeon: Manya Silvas, MD;  Location: [facility] ENDOSCOPY;  Service: Endoscopy;  Laterality: N/A;  . ESOPHAGOGASTRODUODENOSCOPY (EGD) WITH PROPOFOL N/A 06/26/2017   Procedure: ESOPHAGOGASTRODUODENOSCOPY (EGD) WITH PROPOFOL;  Surgeon: Manya Silvas, MD;  Location: [facility] ENDOSCOPY;  Service: Endoscopy;  Laterality: N/A;  . SEPTOPLASTY N/A 03/22/2015   Procedure: SEPTOPLASTY  WITH RIGHT INFERIOR TURBINATE REDUCTION;  Surgeon: Margaretha Sheffield, MD;  Location: [facility];  Service: ENT;  Laterality: N/A;  . SUPRAVENTRICULAR TACHYCARDIA ABLATION N/A 08/21/2011   Procedure: SUPRAVENTRICULAR TACHYCARDIA ABLATION;  Surgeon: Evans Lance, MD;  Location: [facility] CATH LAB;  Service: Cardiovascular;  Laterality: N/A;  . surgery for non descending testicle    . TONSILLECTOMY      Family History  Problem Relation Age of Onset  . Diabetes Other   . Cancer Other   . Breast cancer Maternal Grandmother   . Cancer Maternal Grandfather   . Aortic stenosis Maternal Grandfather     SOCIAL HX: reviewed.    Current Outpatient Medications:  .  apixaban (ELIQUIS) 5 MG TABS tablet, Take 1 tablet (5 mg total) by mouth 2 (two) times daily., Disp: 180 tablet, Rfl: 1 .  atorvastatin (LIPITOR) 40 MG tablet, Take 1 tablet (40 mg total) by mouth daily., Disp: 30 tablet, Rfl: 2 .  cetirizine (ZYRTEC) 10 MG tablet, Take 10 mg by mouth daily. , Disp: , Rfl:  .  fluticasone (FLONASE) 50 MCG/ACT nasal spray, Place 2 sprays into both nostrils daily as needed for allergies or rhinitis., Disp: , Rfl:  .  hydrochlorothiazide (MICROZIDE) 12.5 MG capsule, TAKE 1 CAPSULE (12.5 MG TOTAL) BY MOUTH  DAILY., Disp: 30 capsule, Rfl: 9 .  Lancets (ONETOUCH DELICA PLUS [id number]) MISC, USE TWICE DAILY AS DIRECTED, Disp: 100 each, Rfl: 7 .  lisinopril (ZESTRIL) 10 MG tablet, Take 1 tablet (10 mg total) by mouth daily., Disp: 30 tablet, Rfl: 2 .  metFORMIN (GLUCOPHAGE) 500 MG tablet, Take 1 tablet (500 mg total) by mouth 2 (two) times daily with a meal., Disp: 60 tablet, Rfl: 1 .  metoprolol succinate (TOPROL-XL) 25 MG 24 hr tablet, TAKE 1 TABLET (25 MG TOTAL) BY MOUTH DAILY., Disp: 30 tablet, Rfl: 11 .  NON FORMULARY, as directed. CPAP, Disp: , Rfl:  .  ONETOUCH VERIO test strip, USE TWICE DAILY AS DIRECTED, Disp: 50 strip, Rfl: 31  EXAM:  VITALS per patient if applicable: 732K/02-54  GENERAL: alert.  Answering questions appropriately.  Sounds to be in no acute distress.    PSYCH/NEURO: pleasant and cooperative, no obvious depression or anxiety, speech and thought processing grossly intact  ASSESSMENT AND PLAN:  Discussed the following assessment and plan:  Abdominal pain Resolved.  Not an issue now.  Follow.    Aortic valve stenosis Followed by cardiology.  On eliquis.  Stable.    Atrial fibrillation ([place]) Followed by cardiology.  On eliquis.  No increased heart rate or palpitations.  Stable.    Environmental allergies Controlled.    Essential hypertension Blood pressure has been doing well.  Continue current medication regimen.  Follow pressures.  Follow metabolic panel.    Hyperlipidemia On lipitor.  Low cholesterol diet and exercise.  Follow lipid panel and liver function tests.    Type 2 diabetes mellitus with hyperglycemia (HCC) Low carb diet and exercise.  Follow met b and a1c.  Sugars as outlined.      I discussed the assessment and treatment plan with the patient. The patient was provided an opportunity to ask questions and all were answered. The patient agreed with the plan and demonstrated an understanding of the instructions.   The patient was advised to call  back or seek an in-person evaluation if the symptoms worsen or if the condition fails to improve as anticipated.  I provided 16 minutes of non-face-to-face time during this encounter.   Einar Pheasant, MD

## 2019-01-13 NOTE — Patient Instructions (Signed)
Post-Traumatic Stress Disorder, Adult Post-traumatic stress disorder (PTSD) is a mental health disorder that can occur after a traumatic event, such as a threat to life, serious injury, or sexual violence. Sometimes, PTSD can occur in people who hear about trauma that occurs to a close family member or friend. PTSD can happen to anyone at any age. What are the causes? The condition may be caused by experiencing a traumatic event. What increases the risk? This condition is more likely to occur in:  Engineer, manufacturing.  People who are in circumstances where their lives are threatened.  People who have been the victim of, or witness to, a traumatic event, such as: ? Domestic violence. ? Physical or sexual abuse. ? Rape. ? A terrorist act or gun violence. ? Natural disasters. ? Accidents involving serious injury. What are the signs or symptoms? PTSD symptoms may start soon after a frightening event or months or years later. Symptoms last at least one month and tend to disrupt relationships, work, and daily activities. Symptoms of PTSD can be grouped into several categories. Intrusive symptoms This is when you re-experience the physical and emotional sensations of the traumatic event through one or more of the following ways:  Having upsetting dreams.  Feeling fear, horror, intense sadness, or anger in response to a reminder of the trauma.  Having unwanted upsetting memories while awake.  Having physical reactions triggered by reminders of the trauma, such as increased heart rate, shortness of breath, sweating, and shaking.  Having flashbacks, or feeling like you are going through the event again. Avoidance symptoms This is when you avoid anything that reminds you of the trauma. Symptoms may also include:  Losing interest or not participating in daily activities.  Feeling disconnected from or avoiding other people.  Isolating yourself. Increased arousal symptoms  You may have physical or emotional reactions triggered by your environment. Symptoms may include:  Being easily startled.  Behaving in a careless or self-destructive way.  Becoming easily irritated.  Feeling worried and nervous.  Having trouble concentrating.  Yelling at or hitting other people or objects.  Having trouble sleeping. Negative mood and thoughts  Believing that you or others are bad.  Feeling fear, horror, anger, sadness, guilt, or shame regularly.  Not being able to remember certain parts of the traumatic event.  Blaming yourself or others for the trauma.  Being unable to experience positive emotions, such as happiness or love. How is this diagnosed? PTSD is diagnosed through an assessment by a mental health professional. Dennis Bast will be asked questions about your symptoms. How is this treated? Treatment for this condition may include any of the following or a combination:  Taking medicines to reduce PTSD symptoms.  Having counseling with a mental health professional or therapist who is experienced in treating PTSD.  Doing eye movement desensitization and reprocessing therapy (EMDR). This type of therapy occurs with a specialized therapist. If you have other mental health concerns, these conditions will also be treated. Follow these instructions at home: Lifestyle  Find a support group in your community. Groups are often available for [employer] veterans, trauma victims, and family members or caregivers.  Try to get 7-9 hours of sleep each night. To help with sleep: ? Keep your bedroom cool and dark. ? Do not eat a heavy meal within 1 hour of bedtime. ? Do not drink alcohol or caffeinated drinks before bed. ? Avoid screen time, such as television, computers, tablets, or mobile phones, before bed.  Do not  use illegal drugs.  Contact a local organization to find out if you are eligible for a service dog. Activity  Exercise regularly. Try to do at least 30  minutes of physical activity most days of the week.  Practice self-calming through: ? Breathing exercises. ? Meditation. ? Yoga. ? Listening to quiet music.  Do not isolate yourself. Make connections with other people.  Consider volunteering. Volunteering can help you feel more connected. Eating and drinking  Do not drink alcohol if: ? Your health care provider tells you not to drink. ? You are pregnant, may be pregnant, or are planning to become pregnant.  If you drink alcohol: ? Limit how much you use to:  0-1 drink a day for women.  0-2 drinks a day for men. ? Be aware of how much alcohol is in your drink. In the [place] one drink equals one 12 oz bottle of beer (355 mL), one 5 oz glass of wine (148 mL), or one 1 oz glass of hard liquor (44 mL). General instructions  Take steps to help yourself feel safer at home, such as by installing a security system.  Work with a health care provider or therapist to help manage your symptoms.  Take over-the-counter and prescription medicines as told by your health care provider.  Let others know that you have PTSD and the things that may trigger symptoms. This can protect you and help them understand you better.  If your PTSD is affecting your marriage or family, seek help from a family therapist.  Make sure to let all of your health care providers know you have PTSD. This is especially important if you are having surgery or need to be admitted to the hospital.  Keep all follow-up visits as told by your health care provider. This is important. Contact a health care provider if:  Your symptoms do not get better.  You are feeling overwhelmed by your symptoms. Get help right away if:  You have thoughts of hurting yourself or others. If you ever feel like you may hurt yourself or others, or have thoughts about taking your own life, get help right away. You can go to your nearest emergency department or call:  Your local emergency  services (911 in the [place]  A suicide crisis helpline, such as the [facility] at 587-715-3438. This is open 24 hours a day. Summary  Post-traumatic stress disorder (PTSD) is a mental health disorder that can occur after a traumatic event.  Treatment for PTSD may include medicines, counseling, eye movement desensitization and reprocessing therapy (EMDR), or a combination of therapies.  Find a support group in your community.  Get help right away if you have thoughts of hurting yourself or others. This information is not intended to replace advice given to you by your health care provider. Make sure you discuss any questions you have with your health care provider. Document Released: 03/18/2001 Document Revised: 09/02/2018 Document Reviewed: 09/02/2018 Elsevier Patient Education  2020 [employer].

## 2019-01-16 ENCOUNTER — Encounter: Payer: Self-pay | Admitting: Internal Medicine

## 2019-01-16 NOTE — Assessment & Plan Note (Signed)
Low carb diet and exercise.  Follow met b and a1c.  Sugars as outlined.   

## 2019-01-16 NOTE — Assessment & Plan Note (Signed)
Controlled.  

## 2019-01-16 NOTE — Assessment & Plan Note (Signed)
Resolved.  Not an issue now.  Follow.  

## 2019-01-16 NOTE — Assessment & Plan Note (Signed)
Blood pressure has been doing well.  Continue current medication regimen.  Follow pressures.  Follow metabolic panel.  

## 2019-01-16 NOTE — Assessment & Plan Note (Signed)
Followed by cardiology.  On eliquis.  No increased heart rate or palpitations.  Stable.

## 2019-01-16 NOTE — Assessment & Plan Note (Signed)
On lipitor.  Low cholesterol diet and exercise.  Follow lipid panel and liver function tests.   

## 2019-01-16 NOTE — Assessment & Plan Note (Signed)
Followed by cardiology.  On eliquis.  Stable.   

## 2019-01-17 ENCOUNTER — Other Ambulatory Visit: Payer: Self-pay | Admitting: Neurology

## 2019-01-17 DIAGNOSIS — I10 Essential (primary) hypertension: Secondary | ICD-10-CM

## 2019-01-17 DIAGNOSIS — I4891 Unspecified atrial fibrillation: Secondary | ICD-10-CM

## 2019-01-17 DIAGNOSIS — G4733 Obstructive sleep apnea (adult) (pediatric): Secondary | ICD-10-CM

## 2019-01-17 DIAGNOSIS — Z9989 Dependence on other enabling machines and devices: Secondary | ICD-10-CM

## 2019-01-24 ENCOUNTER — Other Ambulatory Visit: Payer: Self-pay | Admitting: Neurology

## 2019-01-24 ENCOUNTER — Telehealth: Payer: Self-pay

## 2019-01-24 DIAGNOSIS — I350 Nonrheumatic aortic (valve) stenosis: Secondary | ICD-10-CM

## 2019-01-24 DIAGNOSIS — I10 Essential (primary) hypertension: Secondary | ICD-10-CM

## 2019-01-24 DIAGNOSIS — G4733 Obstructive sleep apnea (adult) (pediatric): Secondary | ICD-10-CM

## 2019-01-24 DIAGNOSIS — I4891 Unspecified atrial fibrillation: Secondary | ICD-10-CM

## 2019-01-24 DIAGNOSIS — Z9989 Dependence on other enabling machines and devices: Secondary | ICD-10-CM

## 2019-01-24 DIAGNOSIS — R0981 Nasal congestion: Secondary | ICD-10-CM

## 2019-01-24 NOTE — Telephone Encounter (Signed)
Order placed for HST 

## 2019-01-24 NOTE — Telephone Encounter (Signed)
[employer] denied in lab sleep study, Need HST order. Dr. Brett Fairy agreed to this change.

## 2019-01-28 ENCOUNTER — Other Ambulatory Visit: Payer: Self-pay

## 2019-01-28 ENCOUNTER — Other Ambulatory Visit (INDEPENDENT_AMBULATORY_CARE_PROVIDER_SITE_OTHER): Payer: Managed Care, Other (non HMO)

## 2019-01-28 DIAGNOSIS — E1165 Type 2 diabetes mellitus with hyperglycemia: Secondary | ICD-10-CM

## 2019-01-28 DIAGNOSIS — I10 Essential (primary) hypertension: Secondary | ICD-10-CM | POA: Diagnosis not present

## 2019-01-28 DIAGNOSIS — E785 Hyperlipidemia, unspecified: Secondary | ICD-10-CM

## 2019-01-28 LAB — BASIC METABOLIC PANEL
BUN: 17 mg/dL (ref 6–23)
CO2: 28 mEq/L (ref 19–32)
Calcium: 9.5 mg/dL (ref 8.4–10.5)
Chloride: 101 mEq/L (ref 96–112)
Creatinine, Ser: 1.03 mg/dL (ref 0.40–1.50)
GFR: 75.44 mL/min (ref >60.00)
Glucose, Bld: 190 mg/dL — ABNORMAL HIGH (ref 70–99)
Potassium: 4.3 mEq/L (ref 3.5–5.1)
Sodium: 138 mEq/L (ref 135–145)

## 2019-01-28 LAB — HEMOGLOBIN A1C: Hgb A1c MFr Bld: 8.5 % — ABNORMAL HIGH (ref 4.6–6.5)

## 2019-01-28 LAB — LIPID PANEL
Cholesterol: 147 mg/dL (ref 0–200)
HDL: 35.5 mg/dL — ABNORMAL LOW (ref >39.00)
LDL Cholesterol: 75 mg/dL (ref 0–99)
NonHDL: 111
Total CHOL/HDL Ratio: 4
Triglycerides: 181 mg/dL — ABNORMAL HIGH (ref 0.0–149.0)
VLDL: 36.2 mg/dL (ref 0.0–40.0)

## 2019-01-28 LAB — HEPATIC FUNCTION PANEL
ALT: 37 U/L (ref 0–53)
AST: 21 U/L (ref 0–37)
Albumin: 4.5 g/dL (ref 3.5–5.2)
Alkaline Phosphatase: 84 U/L (ref 39–117)
Bilirubin, Direct: 0.1 mg/dL (ref 0.0–0.3)
Total Bilirubin: 0.8 mg/dL (ref 0.2–1.2)
Total Protein: 6.5 g/dL (ref 6.0–8.3)

## 2019-02-02 ENCOUNTER — Other Ambulatory Visit: Payer: Self-pay | Admitting: Internal Medicine

## 2019-02-02 MED ORDER — METFORMIN HCL 1000 MG PO TABS: 1000.0000 mg | ORAL_TABLET | Freq: Two times a day (BID) | ORAL | 1 refills | Status: DC

## 2019-02-02 NOTE — Progress Notes (Signed)
rx sent in for metformin 1000mg  bid #180 with 1 refill.

## 2019-02-16 ENCOUNTER — Encounter: Payer: Self-pay | Admitting: Internal Medicine

## 2019-02-17 NOTE — Telephone Encounter (Signed)
Sounds good.  She can discuss cost effective treatment options.  Let me know and I will place the order for the referral.    Dr Nicki Reaper

## 2019-02-28 ENCOUNTER — Other Ambulatory Visit: Payer: Self-pay | Admitting: *Deleted

## 2019-02-28 DIAGNOSIS — Z7901 Long term (current) use of anticoagulants: Secondary | ICD-10-CM

## 2019-02-28 DIAGNOSIS — I4891 Unspecified atrial fibrillation: Secondary | ICD-10-CM

## 2019-03-07 ENCOUNTER — Ambulatory Visit: Payer: Managed Care, Other (non HMO) | Admitting: Neurology

## 2019-03-07 DIAGNOSIS — I350 Nonrheumatic aortic (valve) stenosis: Secondary | ICD-10-CM

## 2019-03-07 DIAGNOSIS — I4891 Unspecified atrial fibrillation: Secondary | ICD-10-CM

## 2019-03-07 DIAGNOSIS — R0981 Nasal congestion: Secondary | ICD-10-CM

## 2019-03-07 DIAGNOSIS — G4710 Hypersomnia, unspecified: Secondary | ICD-10-CM

## 2019-03-07 DIAGNOSIS — G4719 Other hypersomnia: Secondary | ICD-10-CM

## 2019-03-07 DIAGNOSIS — G4733 Obstructive sleep apnea (adult) (pediatric): Secondary | ICD-10-CM | POA: Diagnosis not present

## 2019-03-07 DIAGNOSIS — Z8659 Personal history of other mental and behavioral disorders: Secondary | ICD-10-CM

## 2019-03-07 DIAGNOSIS — I10 Essential (primary) hypertension: Secondary | ICD-10-CM

## 2019-03-07 DIAGNOSIS — Z9989 Dependence on other enabling machines and devices: Secondary | ICD-10-CM

## 2019-03-16 NOTE — Procedures (Signed)
Patient Information     First Name: Dalton Last Name: [name] ID: [id number]  Birth Date: Jul 14, 1965 Age: 53 Gender: Male  Referring Provider: Randell Patient Scott,MD BMI: 38.9 (W=293 lb, H=6' 1'')  Neck Circ.:  16 '' Epworth:  19   Sleep Study Information    Study Date: Mar 07, 2019 S/H/A Version: 001.001.001.001 / 4.1.1528 / 77  History:     Patient seen on 01/13/2019 in a referral from [facility] PCP for a parasomnia evaluation/ excessive sleepiness evaluation.  I have the pleasure of seeing Dalton [name], a right -handed Caucasian male [place] with a heart disease /atrial fibrillation related referral for EDS, Parasomnia, and possible CPAP transfer. The patient uses CPAP compliantly, residual AHI was 0.5/h, with a FFM. Epworth score was 19 ! Possible PTSD.   He has a past medical history of Aortic valve stenosis, CAD (coronary artery disease), Carotid artery disease ([place]), Dilated aortic root ([place]), Environmental allergies, Hypercholesterolemia, Hypertension, Obesity (BMI 30-39.9) (04/26/2018), Sleep apnea, and SVT (supraventricular tachycardia) ([place]). He reports he had atrial fibrillation. He is status post nasal septum repair and turbinate reduction in 2016.  Baseline sleep study 2017 in [place] had AHI of 64.7/h. CPAP set to 7 cm water, and nasal pillows were issued at first.                                    Summary & Diagnosis:     All obstructive, mild Sleep apnea recorded at an AHI of 12.3/h, further documented were: 1) intermittent  bradycardia,  2) loud snoring in REM sleep.   Recommendations:      There is much milder apnea noted than in 2017. 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  The patient should undergo a parasomnia evaluation ( EEG-Montage in PSG) when returning for PSG/ MSLT.  Physician Name: Larey Seat, MD               Sleep Summary  Oxygen Saturation Statistics   Start  Study Time: End Study Time: Total Recording Time:  9:32:31 PM 6:22:32 AM 8 h, 50 min  Total Sleep Time % REM of Sleep Time:  7 h, 51 min 16.2    Mean: 96 Minimum: 90 Maximum: 100  Mean of Desaturations Nadirs (%):   93  Oxygen Desat. %: 4-9 10-20 >20 Total  Events Number Total  19 100.0  0 0.0  0 0.0  19 100.0  Oxygen Saturation: <90 <=88 <85 <80 <70  Duration (minutes): Sleep % 0.0 0.0 0.0 0.0 0.0 0.0 0.0 0.0 0.0 0.0     Respiratory Indices      Total Events REM NREM All Night  pRDI:  179  pAHI:  90 ODI:  19  pAHIc:  18   % CSR: 0.0 37.9 12.9 5.2 3.5 21.0 11.7 2.0 2.2 23.6 11.9 2.5 2.4       Pulse Rate Statistics during Sleep (BPM)      Mean: 75 Minimum: 40 Maximum: 101    Indices are calculated using technically valid sleep time of  7 h, 34 min. pRDI/ pAHI are calculated using 02 desaturations ? 3% Sit N/A Body Position Statistics  Position Supine Prone Right Left Non-Supine  Sleep (min) 258.5 52.5 159.5 0.0 212.0  Sleep % 54.8 11.1 33.8 0.0 45.0  pRDI 28.6 28.9 13.9 N/A 17.7  pAHI 17.2 10.4 3.9 N/A 5.5  ODI 2.7 2.3 1.9 N/A 2.0     Snoring Statistics Snoring Level (dB) >40 >50 >60 >70 >80 >Threshold (45)  Sleep (min) 223.6 7.6 2.3 0.0 0.0 15.7  Sleep % 47.4 1.6 0.5 0.0 0.0 3.3    Mean: 41 dB Sleep Stages Chart

## 2019-03-16 NOTE — Addendum Note (Signed)
Addended by: Larey Seat on: 03/16/2019 06:18 PM   Modules accepted: Orders

## 2019-03-17 ENCOUNTER — Telehealth: Payer: Self-pay | Admitting: Neurology

## 2019-03-17 NOTE — Telephone Encounter (Signed)
-----   Message from Larey Seat, MD sent at 03/16/2019  6:18 PM EDT ----- All obstructive, mild Sleep apnea recorded at an AHI of 12.3/h,  further documented were:  1) intermittent bradycardia,  2) loud snoring in REM sleep.   Recommendations:     There is much milder apnea noted than in 2017. CPAP therapy  should be continued and the patient further evaluated for Narcolepsy,  given that EDS is high while CPAP is used.   The patient should undergo a parasomnia evaluation ( EEG-Montage  in PSG) when returning for PSG/ MSLT.   PS : Myriam Jacobson, Please inform me if the patient needs new supplies or even a new machine  Physician Name: Larey Seat, MD

## 2019-03-17 NOTE — Telephone Encounter (Signed)
Called the patient and he had be discuss sleep study results with his wife. I advised her of the sleep study findings. Pt wife states that when the patient was taking melatonin per Dr. Edwena Felty recommendation, the wife states he was still moving and hitting in sleep. I advised that Dr Brett Fairy recommends bringing the patient in for the sleep study over night and have him use the CPAP to make sure apnea is treated and also assess REM BD. Is assessed. We will also like to assess narcolepsy diagnoses in a daytime sleep study. Advised the wife that sleep lab will hopefully be in contact soon to get him set up with that. I reviewed the pt's med list and he is not taking anything that would alter the sleep study. Advised that if there was something to be started prior to sleep study he would need to inform us so that we can make sure that medication will not alter the study. Patients wife verbalized understanding.

## 2019-03-19 ENCOUNTER — Other Ambulatory Visit: Payer: Self-pay | Admitting: Internal Medicine

## 2019-03-25 ENCOUNTER — Other Ambulatory Visit: Payer: Self-pay

## 2019-03-25 ENCOUNTER — Ambulatory Visit (HOSPITAL_COMMUNITY): Payer: Managed Care, Other (non HMO) | Attending: Cardiovascular Disease

## 2019-03-25 ENCOUNTER — Other Ambulatory Visit: Payer: Managed Care, Other (non HMO) | Admitting: *Deleted

## 2019-03-25 DIAGNOSIS — I350 Nonrheumatic aortic (valve) stenosis: Secondary | ICD-10-CM | POA: Insufficient documentation

## 2019-03-25 DIAGNOSIS — I4891 Unspecified atrial fibrillation: Secondary | ICD-10-CM

## 2019-03-25 DIAGNOSIS — Z7901 Long term (current) use of anticoagulants: Secondary | ICD-10-CM

## 2019-03-25 LAB — CBC
Hematocrit: 41.2 % (ref 37.5–51.0)
Hemoglobin: 14.1 g/dL (ref 13.0–17.7)
MCH: 31.4 pg (ref 26.6–33.0)
MCHC: 34.2 g/dL (ref 31.5–35.7)
MCV: 92 fL (ref 79–97)
Platelets: 152 10*3/uL (ref 150–450)
RBC: 4.49 x10E6/uL (ref 4.14–5.80)
RDW: 13.1 % (ref 11.6–15.4)
WBC: 5.3 10*3/uL (ref 3.4–10.8)

## 2019-03-25 MED ORDER — PERFLUTREN LIPID MICROSPHERE
1.0000 mL | INTRAVENOUS | Status: AC | PRN
  Administered 2019-03-25: 2 mL via INTRAVENOUS

## 2019-03-28 ENCOUNTER — Telehealth: Payer: Self-pay

## 2019-03-28 NOTE — Telephone Encounter (Signed)
LMTCB re: Echo and labs.

## 2019-03-28 NOTE — Telephone Encounter (Signed)
-----   Message from [name], MD sent at 03/26/2019  7:39 PM EDT ----- Let the patient know the heart has gotten larger and somewhat weaker. Need to discuss at [place] sometime soon. A copy will be sent to Einar Pheasant, MD

## 2019-03-29 ENCOUNTER — Ambulatory Visit (INDEPENDENT_AMBULATORY_CARE_PROVIDER_SITE_OTHER): Payer: Managed Care, Other (non HMO) | Admitting: Family Medicine

## 2019-03-29 ENCOUNTER — Encounter: Payer: Self-pay | Admitting: Internal Medicine

## 2019-03-29 ENCOUNTER — Other Ambulatory Visit: Payer: Self-pay

## 2019-03-29 DIAGNOSIS — R509 Fever, unspecified: Secondary | ICD-10-CM

## 2019-03-29 DIAGNOSIS — R52 Pain, unspecified: Secondary | ICD-10-CM | POA: Diagnosis not present

## 2019-03-29 DIAGNOSIS — R5383 Other fatigue: Secondary | ICD-10-CM

## 2019-03-29 DIAGNOSIS — M791 Myalgia, unspecified site: Secondary | ICD-10-CM

## 2019-03-29 DIAGNOSIS — R05 Cough: Secondary | ICD-10-CM | POA: Diagnosis not present

## 2019-03-29 DIAGNOSIS — Z20822 Contact with and (suspected) exposure to covid-19: Secondary | ICD-10-CM

## 2019-03-29 NOTE — Progress Notes (Signed)
Patient ID: Dalton [name], male   DOB: 07-17-1965, 53 y.o.   MRN: [id number]    Virtual Visit via video Note  This visit type was conducted due to national recommendations for restrictions regarding the COVID-19 pandemic (e.g. social distancing).  This format is felt to be most appropriate for this patient at this time.  All issues noted in this document were discussed and addressed.  No physical exam was performed (except for noted visual exam findings with Video Visits).   I connected with [name] today at  2:20 PM EDT by a video enabled telemedicine application and verified that I am speaking with the correct person using two identifiers. Location patient: home Location provider: work or home office Persons participating in the virtual visit: patient, provider  I discussed the limitations, risks, security and privacy concerns of performing an evaluation and management service by video and the availability of in person appointments. I also discussed with the patient that there may be a patient responsible charge related to this service. The patient expressed understanding and agreed to proceed.  HPI:  Patient and I connected via video due to complaint of body aches, fever and chills dry cough for 2 or 3 days.  States upon waking this morning, symptoms seem no worse.  Felt really fatigued.  Has not checked temperature with a thermometer, but has felt hot and cold alternating.  Denies any known sick contacts.  Denies shortness breath or wheezing.  Denies nausea or vomiting, appetite has been normal.  Denies chest pain.   ROS: See pertinent positives and negatives per HPI.  Past Medical History:  Diagnosis Date  . Aortic valve stenosis    a. Bicuspid AV - 2D Echo 06/2014 - mod AS, mild AI, mildly dilated aortic root.  [name] CAD (coronary artery disease)   . Carotid artery disease ([place])    a. Mild by duplex 05/2015 - 1-39% BICA. Repeat due 05/2017.  . Dilated aortic root ([place])    a.  By echo 06/2014.  . Environmental allergies   . Hypercholesterolemia   . Hypertension   . Obesity (BMI 30-39.9) 04/26/2018  . Sleep apnea   . SVT (supraventricular tachycardia) ([place])    a. h/o SVT - Ablation done 2013.    Past Surgical History:  Procedure Laterality Date  . CARDIAC ELECTROPHYSIOLOGY STUDY AND ABLATION  08/21/2011  . COLONOSCOPY WITH PROPOFOL N/A 06/26/2017   Procedure: COLONOSCOPY WITH PROPOFOL;  Surgeon: Manya Silvas, MD;  Location: [facility] ENDOSCOPY;  Service: Endoscopy;  Laterality: N/A;  . ESOPHAGOGASTRODUODENOSCOPY (EGD) WITH PROPOFOL N/A 06/26/2017   Procedure: ESOPHAGOGASTRODUODENOSCOPY (EGD) WITH PROPOFOL;  Surgeon: Manya Silvas, MD;  Location: [facility] ENDOSCOPY;  Service: Endoscopy;  Laterality: N/A;  . SEPTOPLASTY N/A 03/22/2015   Procedure: SEPTOPLASTY  WITH RIGHT INFERIOR TURBINATE REDUCTION;  Surgeon: Margaretha Sheffield, MD;  Location: [place];  Service: ENT;  Laterality: N/A;  . SUPRAVENTRICULAR TACHYCARDIA ABLATION N/A 08/21/2011   Procedure: SUPRAVENTRICULAR TACHYCARDIA ABLATION;  Surgeon: Evans Lance, MD;  Location: [facility] CATH LAB;  Service: Cardiovascular;  Laterality: N/A;  . surgery for non descending testicle    . TONSILLECTOMY      Family History  Problem Relation Age of Onset  . Diabetes Other   . Cancer Other   . Breast cancer Maternal Grandmother   . Cancer Maternal Grandfather   . Aortic stenosis Maternal Grandfather    Social History   Tobacco Use  . Smoking status: Former Smoker    Types: Cigarettes  Quit date: 11/04/1988    Years since quitting: 30.4  . Smokeless tobacco: Never Used  Substance Use Topics  . Alcohol use: Yes    Alcohol/week: 8.0 standard drinks    Types: 7 Glasses of wine, 1 Shots of liquor per week    Comment: occasionally    Current Outpatient Medications:  .  apixaban (ELIQUIS) 5 MG TABS tablet, Take 1 tablet (5 mg total) by mouth 2 (two) times daily., Disp: 180 tablet, Rfl: 1 .  atorvastatin  (LIPITOR) 40 MG tablet, Take 1 tablet (40 mg total) by mouth daily., Disp: 30 tablet, Rfl: 2 .  cetirizine (ZYRTEC) 10 MG tablet, Take 10 mg by mouth daily. , Disp: , Rfl:  .  fluticasone (FLONASE) 50 MCG/ACT nasal spray, Place 2 sprays into both nostrils daily as needed for allergies or rhinitis., Disp: , Rfl:  .  hydrochlorothiazide (MICROZIDE) 12.5 MG capsule, TAKE 1 CAPSULE (12.5 MG TOTAL) BY MOUTH DAILY., Disp: 30 capsule, Rfl: 9 .  Lancets (ONETOUCH DELICA PLUS 123XX123) MISC, USE TWICE DAILY AS DIRECTED, Disp: 100 each, Rfl: 7 .  lisinopril (ZESTRIL) 10 MG tablet, Take 1 tablet (10 mg total) by mouth daily., Disp: 30 tablet, Rfl: 2 .  metFORMIN (GLUCOPHAGE) 1000 MG tablet, Take 1 tablet (1,000 mg total) by mouth 2 (two) times daily with a meal., Disp: 180 tablet, Rfl: 1 .  metoprolol succinate (TOPROL-XL) 25 MG 24 hr tablet, TAKE 1 TABLET (25 MG TOTAL) BY MOUTH DAILY., Disp: 30 tablet, Rfl: 11 .  NON FORMULARY, as directed. CPAP, Disp: , Rfl:  .  ONETOUCH VERIO test strip, USE TWICE DAILY AS DIRECTED, Disp: 50 strip, Rfl: 31  EXAM:  GENERAL: alert, oriented, appears well and in no acute distress  HEENT: atraumatic, conjunttiva clear, no obvious abnormalities on inspection of external nose and ears  NECK: normal movements of the head and neck  LUNGS: on inspection no signs of respiratory distress, breathing rate appears normal, no obvious gross SOB, gasping or wheezing  CV: no obvious cyanosis  MS: moves all visible extremities without noticeable abnormality  PSYCH/NEURO: pleasant and cooperative, no obvious depression or anxiety, speech and thought processing grossly intact  ASSESSMENT AND PLAN:  Discussed the following assessment and plan:  Suspected COVID-19 virus infection, body aches, fever/chills - advised that due to symptoms, we need to get patient set up for COVID-19 testing.  Patient advised that I will put order in and he can go to testing location for for  drive-through testing. Patient given the address of testing site.  Patient advised that testing is taking 2 to 7 days to result, and while we are awaiting results patient must remain under self quarantine and monitor for any changing/worsening symptoms.  Advised over-the-counter medications such as Tylenol can be used to help treat pain or fevers, Robitussin can be used to help calm cough, allergy medication such as Claritin or Allegra can help reduce congestion.  Also discussed getting plenty of rest and increasing fluid intake.  Made patient aware that test results as well as how his symptoms progress will determine when the self quarantine will be able to end.  Also advised to monitor self for any worsening symptoms, advised if severe shortness of breath develops, high fever that is not reduced with use of Tylenol, chest pain, severe vomiting or diarrhea  --patient must call on-call and or go to ER right away for evaluation. patient verbalized understanding of these instructions.  Work note released to [place]  discussed the assessment and treatment plan with the patient. The patient was provided an opportunity to ask questions and all were answered. The patient agreed with the plan and demonstrated an understanding of the instructions.   The patient was advised to call back or seek an in-person evaluation if the symptoms worsen or if the condition fails to improve as anticipated.  15 minutes spent on video call with patient  Jodelle Green, FNP

## 2019-03-30 ENCOUNTER — Other Ambulatory Visit: Payer: Self-pay

## 2019-03-30 DIAGNOSIS — Z20822 Contact with and (suspected) exposure to covid-19: Secondary | ICD-10-CM

## 2019-04-01 LAB — NOVEL CORONAVIRUS, NAA: SARS-CoV-2, NAA: DETECTED — AB

## 2019-04-05 ENCOUNTER — Telehealth: Payer: Self-pay

## 2019-04-05 NOTE — Telephone Encounter (Signed)
Pt was called to schedule NPSG and MSLT. Pt stated he is not doing anymore sleep studies. Pt was told that if or when he changes his mind to give [place] a call back.

## 2019-04-06 ENCOUNTER — Telehealth: Payer: Self-pay

## 2019-04-06 NOTE — Progress Notes (Signed)
Cardiology Office Note:        Date:  04/08/2019   ID:  Dalton [name], DOB 02-Jul-1966, MRN [id number]     Virtual Visit via Video Note   This visit type was conducted due to national recommendations for restrictions regarding the COVID-19 Pandemic (e.g. social distancing) in an effort to limit this patient's exposure and mitigate transmission in our community.  Due to his co-morbid illnesses, this patient is at least at moderate risk for complications without adequate follow up.  This format is felt to be most appropriate for this patient at this time.  All issues noted in this document were discussed and addressed.  A limited physical exam was performed with this format.  Please refer to the patient's chart for his consent to telehealth for [facility].   Date:  04/08/2019   ID:  Dalton [name], DOB 12-Aug-1965, MRN [id number]  Patient Location: Home Provider Location: Office  PCP:  Dalton Pheasant, MD  Cardiologist:  No primary care provider on file.  Electrophysiologist:  None   Evaluation Performed:  Follow-Up Visit  Chief Complaint: Aortic valve disease.  Being seen virtually because the patient is COVID positive.  History of Present Illness:    Dalton [name] is a 53 y.o. male with  hx of biscupid aortic valve with AS, prior SVT s/p RF ablation, HTN, HLD who presents for follow-up. Last echo 06/2014: mod LVH, EF 60-65%, grade 1 DD, severely calcified AV leaflets (bicuspid by prior report, poorly visualized on this study), moderate aortic stenosis with mild AI, mildly dilated ascending aorta - repeat planned in 2 years.   The patient does have symptoms concerning for COVID-19 infection (fever, chills, cough, or new shortness of breath).    Past Medical History:  Diagnosis Date  . Aortic valve stenosis    a. Bicuspid AV - 2D Echo 06/2014 - mod AS, mild AI, mildly dilated aortic root.  [name] CAD (coronary artery disease)   . Carotid artery disease ([place])    a. Mild  by duplex 05/2015 - 1-39% BICA. Repeat due 05/2017.  . Dilated aortic root ([place])    a. By echo 06/2014.  . Environmental allergies   . Hypercholesterolemia   . Hypertension   . Obesity (BMI 30-39.9) 04/26/2018  . Sleep apnea   . SVT (supraventricular tachycardia) ([place])    a. h/o SVT - Ablation done 2013.   Past Surgical History:  Procedure Laterality Date  . CARDIAC ELECTROPHYSIOLOGY STUDY AND ABLATION  08/21/2011  . COLONOSCOPY WITH PROPOFOL N/A 06/26/2017   Procedure: COLONOSCOPY WITH PROPOFOL;  Surgeon: Manya Silvas, MD;  Location: [facility] ENDOSCOPY;  Service: Endoscopy;  Laterality: N/A;  . ESOPHAGOGASTRODUODENOSCOPY (EGD) WITH PROPOFOL N/A 06/26/2017   Procedure: ESOPHAGOGASTRODUODENOSCOPY (EGD) WITH PROPOFOL;  Surgeon: Manya Silvas, MD;  Location: [facility] ENDOSCOPY;  Service: Endoscopy;  Laterality: N/A;  . SEPTOPLASTY N/A 03/22/2015   Procedure: SEPTOPLASTY  WITH RIGHT INFERIOR TURBINATE REDUCTION;  Surgeon: Margaretha Sheffield, MD;  Location: [place];  Service: ENT;  Laterality: N/A;  . SUPRAVENTRICULAR TACHYCARDIA ABLATION N/A 08/21/2011   Procedure: SUPRAVENTRICULAR TACHYCARDIA ABLATION;  Surgeon: Evans Lance, MD;  Location: [facility] CATH LAB;  Service: Cardiovascular;  Laterality: N/A;  . surgery for non descending testicle    . TONSILLECTOMY       Current Meds  Medication Sig  . apixaban (ELIQUIS) 5 MG TABS tablet Take 1 tablet (5 mg total) by mouth 2 (two) times daily.  [name] atorvastatin (LIPITOR) 40 MG tablet  Take 1 tablet (40 mg total) by mouth daily.  . cetirizine (ZYRTEC) 10 MG tablet Take 10 mg by mouth daily.   . fluticasone (FLONASE) 50 MCG/ACT nasal spray Place 2 sprays into both nostrils daily as needed for allergies or rhinitis.  . hydrochlorothiazide (MICROZIDE) 12.5 MG capsule TAKE 1 CAPSULE (12.5 MG TOTAL) BY MOUTH DAILY.  [name] Lancets (ONETOUCH DELICA PLUS 123XX123) MISC USE TWICE DAILY AS DIRECTED  . lisinopril (ZESTRIL) 10 MG tablet Take 1 tablet (10 mg  total) by mouth daily.  . metFORMIN (GLUCOPHAGE) 1000 MG tablet Take 1 tablet (1,000 mg total) by mouth 2 (two) times daily with a meal.  . metoprolol succinate (TOPROL-XL) 25 MG 24 hr tablet TAKE 1 TABLET (25 MG TOTAL) BY MOUTH DAILY.  . NON FORMULARY as directed. CPAP  . ONETOUCH VERIO test strip USE TWICE DAILY AS DIRECTED     Allergies:   Tomato   Social History   Tobacco Use  . Smoking status: Former Smoker    Types: Cigarettes    Quit date: 11/04/1988    Years since quitting: 30.4  . Smokeless tobacco: Never Used  Substance Use Topics  . Alcohol use: Yes    Alcohol/week: 8.0 standard drinks    Types: 7 Glasses of wine, 1 Shots of liquor per week    Comment: occasionally  . Drug use: Not Currently    Comment: many years ago in highschool     Family Hx: The patient's family history includes Aortic stenosis in his maternal grandfather; Breast cancer in his maternal grandmother; Cancer in his maternal grandfather and another family member; Diabetes in an other family member.  ROS:   Please see the history of present illness.    Patient has upper respiratory congestion.  He is not short of breath.  He and his wife are positive for COVID. All other systems reviewed and are negative.   Prior CV studies:   The following studies were reviewed today:  2D Doppler echocardiogram September 2020: ECHOCARDIOGRAPHY 03/2019: IMPRESSIONS    1. Left ventricular ejection fraction, by visual estimation, is 45 to 50%. The left ventricle has mildly decreased function. Normal left ventricular size. Left ventricular septal wall thickness was normal. There is mildly increased left ventricular  hypertrophy.  2. Definity contrast agent was given IV to delineate the left ventricular endocardial borders.  3. Inferior and septal wall hypokinesis.  4. Global right ventricle has normal systolic function.The right ventricular size is normal. No increase in right ventricular wall thickness.  5.  Left atrial size was moderately dilated.  6. Right atrial size was normal.  7. The mitral valve is normal in structure. Trace mitral valve regurgitation. No evidence of mitral stenosis.  8. The tricuspid valve is normal in structure. Tricuspid valve regurgitation is mild.  9. The aortic valve is bicuspid Aortic valve regurgitation is mild to moderate by color flow Doppler. Moderate aortic valve stenosis. 10. The pulmonic valve was grossly normal. Pulmonic valve regurgitation is mild by color flow Doppler. 11. Normal pulmonary artery systolic pressure.  Of note is that the End systolic left ventricular diameter is 50 mm and was 40 mm 1 year ago.  Currently the LV end-diastolic diameter is 62 mm.    Labs/Other Tests and Data Reviewed:    EKG:  No ECG reviewed.  Recent Labs: 05/14/2018: TSH 1.56 01/28/2019: ALT 37; BUN 17; Creatinine, Ser 1.03; Potassium 4.3; Sodium 138 03/25/2019: Hemoglobin 14.1; Platelets 152   Recent Lipid Panel Lab Results  Component Value Date/Time   CHOL 147 01/28/2019 07:54 AM   TRIG 181.0 (H) 01/28/2019 07:54 AM   HDL 35.50 (L) 01/28/2019 07:54 AM   CHOLHDL 4 01/28/2019 07:54 AM   LDLCALC 75 01/28/2019 07:54 AM   LDLDIRECT 228.0 05/14/2018 09:51 AM    Wt Readings from Last 3 Encounters:  04/08/19 263 lb (119.3 kg)  01/13/19 294 lb (133.4 kg)  09/17/18 292 lb 3.2 oz (132.5 kg)     Objective:    Vital Signs:  Ht 6\' 1"  (1.854 m)   Wt 263 lb (119.3 kg)   BMI 34.70 kg/m    VITAL SIGNS:  reviewed GEN:  He appears somewhat pale.  No vital signs are provided.  ASSESSMENT & PLAN:    1. Nonrheumatic aortic valve stenosis   2. Coronary artery disease of native artery of native heart with stable angina pectoris ([place])   3. Atrial fibrillation, unspecified type ([place])   4. Essential hypertension   5. On anticoagulant therapy   6. Obstructive sleep apnea syndrome   7. Hyperlipidemia, unspecified hyperlipidemia type   8. Educated about COVID-19 virus  infection    PLAN:  1. EF and increase in left ventricular cavity size suggests aortic valve may need therapy.  The patient has COVID currently.  As soon as he is done with isolation, I will have him to come in for clinical evaluation and further recommendations. 2. Not discussed 3. Not discussed 4. No data 5. Continue anticoagulation 6. Continue CPAP 7. Continue current therapy 8. The patient is COVID-19 positive.  Reiterated the need for isolation.  Reiterated that 2 weeks from his positive test will be the earliest that isolation can be broken.  He needs to keep primary care appraised of his overall clinical status.  Needs cardiology follow-up after isolation is done to discuss management of cardiac disease.  COVID-19 Education: The signs and symptoms of COVID-19 were discussed with the patient and how to seek care for testing (follow up with PCP or arrange E-visit).  The importance of social distancing was discussed today.  Time:   Today, I have spent 15 minutes with the patient with telehealth technology discussing the above problems.     Medication Adjustments/Labs and Tests Ordered: Current medicines are reviewed at length with the patient today.  Concerns regarding medicines are outlined above.   Tests Ordered: No orders of the defined types were placed in this encounter.   Medication Changes: No orders of the defined types were placed in this encounter.   Follow Up:  In Person in 1 month(s)  Signed, Sinclair Grooms, MD  04/08/2019 10:39 AM    [place]

## 2019-04-06 NOTE — Telephone Encounter (Signed)
Switched pt to a virtual visit d/t positive COVID test.    Virtual Visit Pre-Appointment Phone Call  "(Name), I am calling you today to discuss your upcoming appointment. We are currently trying to limit exposure to the virus that causes COVID-19 by seeing patients at home rather than in the office."  1. "What is the BEST phone number to call the day of the visit?" -[phone]  2. "Do you have or have access to (through a family member/friend) a smartphone with video capability that we can use for your visit?" a. If yes - list this number in appt notes as "cell" (if different from BEST phone #) and list the appointment type as a VIDEO visit in appointment notes b. If no - list the appointment type as a PHONE visit in appointment notes  3. Confirm consent - "In the setting of the current Covid19 crisis, you are scheduled for a (phone or video) visit with your provider on (date) at (time).  Just as we do with many in-office visits, in order for you to participate in this visit, we must obtain consent.  If you'd like, I can send this to your mychart (if signed up) or email for you to review.  Otherwise, I can obtain your verbal consent now.  All virtual visits are billed to your insurance company just like a normal visit would be.  By agreeing to a virtual visit, we'd like you to understand that the technology does not allow for your provider to perform an examination, and thus may limit your provider's ability to fully assess your condition. If your provider identifies any concerns that need to be evaluated in person, we will make arrangements to do so.  Finally, though the technology is pretty good, we cannot assure that it will always work on either your or our end, and in the setting of a video visit, we may have to convert it to a phone-only visit.  In either situation, we cannot ensure that we have a secure connection.  Are you willing to proceed?" STAFF: Did the patient verbally acknowledge  consent to telehealth visit? Document YES/NO here: YES  4. Advise patient to be prepared - "Two hours prior to your appointment, go ahead and check your blood pressure, pulse, oxygen saturation, and your weight (if you have the equipment to check those) and write them all down. When your visit starts, your provider will ask you for this information. If you have an Apple Watch or Kardia device, please plan to have heart rate information ready on the day of your appointment. Please have a pen and paper handy nearby the day of the visit as well."  5. Give patient instructions for MyChart download to smartphone OR Doximity/Doxy.me as below if video visit (depending on what platform provider is using)  6. Inform patient they will receive a phone call 15 minutes prior to their appointment time (may be from unknown caller ID) so they should be prepared to answer    TELEPHONE CALL NOTE  Dalton [name] has been deemed a candidate for a follow-up tele-health visit to limit community exposure during the Covid-19 pandemic. I spoke with the patient via phone to ensure availability of phone/video source, confirm preferred email & phone number, and discuss instructions and expectations.  I reminded Dalton [name] to be prepared with any vital sign and/or heart rhythm information that could potentially be obtained via home monitoring, at the time of his visit. I reminded Dalton [name]  to expect a phone call prior to his visit.  Wilma Flavin, RN 04/06/2019 10:33 AM   INSTRUCTIONS FOR DOWNLOADING THE MYCHART APP TO SMARTPHONE  - The patient must first make sure to have activated MyChart and know their login information - If Apple, go to [employer] and type in MyChart in the search bar and download the app. If Android, ask patient to go to [employer] and type in [place] in the search bar and download the app. The app is free but as with any other app downloads, their phone may require them to  verify saved payment information or Apple/Android password.  - The patient will need to then log into the app with their MyChart username and password, and select [facility] as their healthcare provider to link the account. When it is time for your visit, go to the MyChart app, find appointments, and click Begin Video Visit. Be sure to Select Allow for your device to access the Microphone and Camera for your visit. You will then be connected, and your provider will be with you shortly.  **If they have any issues connecting, or need assistance please contact MyChart service desk [phone])**  **If using a computer, in order to ensure the best quality for their visit they will need to use either of the following Internet Browsers: [employer], or Google Chrome**  IF USING DOXIMITY or DOXY.ME - The patient will receive a link just prior to their visit by text.     FULL LENGTH CONSENT FOR TELE-HEALTH VISIT   I hereby voluntarily request, consent and authorize [facility] and its employed or contracted physicians, physician assistants, nurse practitioners or other licensed health care professionals (the Practitioner), to provide me with telemedicine health care services (the "Services") as deemed necessary by the treating Practitioner. I acknowledge and consent to receive the Services by the Practitioner via telemedicine. I understand that the telemedicine visit will involve communicating with the Practitioner through live audiovisual communication technology and the disclosure of certain medical information by electronic transmission. I acknowledge that I have been given the opportunity to request an in-person assessment or other available alternative prior to the telemedicine visit and am voluntarily participating in the telemedicine visit.  I understand that I have the right to withhold or withdraw my consent to the use of telemedicine in the course of my care at any time,  without affecting my right to future care or treatment, and that the Practitioner or I may terminate the telemedicine visit at any time. I understand that I have the right to inspect all information obtained and/or recorded in the course of the telemedicine visit and may receive copies of available information for a reasonable fee.  I understand that some of the potential risks of receiving the Services via telemedicine include:  [name] Delay or interruption in medical evaluation due to technological equipment failure or disruption; . Information transmitted may not be sufficient (e.g. poor resolution of images) to allow for appropriate medical decision making by the Practitioner; and/or  . In rare instances, security protocols could fail, causing a breach of personal health information.  Furthermore, I acknowledge that it is my responsibility to provide information about my medical history, conditions and care that is complete and accurate to the best of my ability. I acknowledge that Practitioner's advice, recommendations, and/or decision may be based on factors not within their control, such as incomplete or inaccurate data provided by me or distortions of diagnostic images or  specimens that may result from electronic transmissions. I understand that the practice of medicine is not an exact science and that Practitioner makes no warranties or guarantees regarding treatment outcomes. I acknowledge that I will receive a copy of this consent concurrently upon execution via email to the email address I last provided but may also request a printed copy by calling the office of [facility].    I understand that my insurance will be billed for this visit.   I have read or had this consent read to me. . I understand the contents of this consent, which adequately explains the benefits and risks of the Services being provided via telemedicine.  . I have been provided ample opportunity to ask questions regarding  this consent and the Services and have had my questions answered to my satisfaction. . I give my informed consent for the services to be provided through the use of telemedicine in my medical care  By participating in this telemedicine visit I agree to the above.

## 2019-04-08 ENCOUNTER — Encounter: Payer: Self-pay | Admitting: Interventional Cardiology

## 2019-04-08 ENCOUNTER — Other Ambulatory Visit: Payer: Self-pay

## 2019-04-08 ENCOUNTER — Telehealth (INDEPENDENT_AMBULATORY_CARE_PROVIDER_SITE_OTHER): Payer: Managed Care, Other (non HMO) | Admitting: Interventional Cardiology

## 2019-04-08 VITALS — Ht 73.0 in | Wt 263.0 lb

## 2019-04-08 DIAGNOSIS — I25118 Atherosclerotic heart disease of native coronary artery with other forms of angina pectoris: Secondary | ICD-10-CM

## 2019-04-08 DIAGNOSIS — Z7189 Other specified counseling: Secondary | ICD-10-CM

## 2019-04-08 DIAGNOSIS — I350 Nonrheumatic aortic (valve) stenosis: Secondary | ICD-10-CM | POA: Diagnosis not present

## 2019-04-08 DIAGNOSIS — Z7901 Long term (current) use of anticoagulants: Secondary | ICD-10-CM

## 2019-04-08 DIAGNOSIS — I4891 Unspecified atrial fibrillation: Secondary | ICD-10-CM

## 2019-04-08 DIAGNOSIS — G4733 Obstructive sleep apnea (adult) (pediatric): Secondary | ICD-10-CM

## 2019-04-08 DIAGNOSIS — E785 Hyperlipidemia, unspecified: Secondary | ICD-10-CM

## 2019-04-08 DIAGNOSIS — I10 Essential (primary) hypertension: Secondary | ICD-10-CM

## 2019-04-08 NOTE — Patient Instructions (Signed)

## 2019-04-11 ENCOUNTER — Other Ambulatory Visit: Payer: Self-pay | Admitting: Internal Medicine

## 2019-04-14 ENCOUNTER — Other Ambulatory Visit: Payer: Self-pay

## 2019-04-25 ENCOUNTER — Encounter: Payer: Self-pay | Admitting: Internal Medicine

## 2019-04-29 ENCOUNTER — Other Ambulatory Visit: Payer: Self-pay

## 2019-04-29 ENCOUNTER — Ambulatory Visit (INDEPENDENT_AMBULATORY_CARE_PROVIDER_SITE_OTHER): Payer: Managed Care, Other (non HMO) | Admitting: Internal Medicine

## 2019-04-29 DIAGNOSIS — I4891 Unspecified atrial fibrillation: Secondary | ICD-10-CM | POA: Diagnosis not present

## 2019-04-29 DIAGNOSIS — G4733 Obstructive sleep apnea (adult) (pediatric): Secondary | ICD-10-CM

## 2019-04-29 DIAGNOSIS — I10 Essential (primary) hypertension: Secondary | ICD-10-CM | POA: Diagnosis not present

## 2019-04-29 DIAGNOSIS — E785 Hyperlipidemia, unspecified: Secondary | ICD-10-CM

## 2019-04-29 DIAGNOSIS — Z9109 Other allergy status, other than to drugs and biological substances: Secondary | ICD-10-CM | POA: Diagnosis not present

## 2019-04-29 DIAGNOSIS — I350 Nonrheumatic aortic (valve) stenosis: Secondary | ICD-10-CM | POA: Diagnosis not present

## 2019-04-29 DIAGNOSIS — E1165 Type 2 diabetes mellitus with hyperglycemia: Secondary | ICD-10-CM

## 2019-04-29 NOTE — Telephone Encounter (Signed)
Error

## 2019-04-29 NOTE — Progress Notes (Signed)
Patient ID: Dalton [name], male   DOB: 12/25/1965, 53 y.o.   MRN: [id number]   Virtual Visit via video Note  This visit type was conducted due to national recommendations for restrictions regarding the COVID-19 pandemic (e.g. social distancing).  This format is felt to be most appropriate for this patient at this time.  All issues noted in this document were discussed and addressed.  No physical exam was performed (except for noted visual exam findings with Video Visits).   I connected with Dalton [name] by a video enabled telemedicine application and verified that I am speaking with the correct person using two identifiers. Location patient: home Location provider: work Persons participating in the virtual visit: patient, provider  I discussed the limitations, risks, security and privacy concerns of performing an evaluation and management service by video and the availability of in person appointments.  The patient expressed understanding and agreed to proceed.   Reason for visit:  Scheduled follow up.   HPI: He reports he is doing well.  Recently had covid.  Back at work.  Feels better.  Feels back to baseline.  Staying active.  No chest pain.  No sob.  No acid reflux.  No abdominal pain.  Bowels moving.  Had bicuspid aortic valve with AS.  Followed by cardiology.  Planning for f/u echo.  On eliquis - for afib.  No increased heart rate or palpitations.  No abdominal pain.  Bowels moving.  Taking metformin.  Has adjusted his diet.  Has lost weight.  Lost 30 pounds since last visit.  Sugars improved.  States sugars averaging 96-130.  Off protonix - no acid reflux.     ROS: See pertinent positives and negatives per HPI.  Past Medical History:  Diagnosis Date  . Aortic valve stenosis    a. Bicuspid AV - 2D Echo 06/2014 - mod AS, mild AI, mildly dilated aortic root.  [name] CAD (coronary artery disease)   . Carotid artery disease ([place])    a. Mild by duplex 05/2015 - 1-39% BICA. Repeat due  05/2017.  . Dilated aortic root ([place])    a. By echo 06/2014.  . Environmental allergies   . Hypercholesterolemia   . Hypertension   . Obesity (BMI 30-39.9) 04/26/2018  . Sleep apnea   . SVT (supraventricular tachycardia) ([place])    a. h/o SVT - Ablation done 2013.    Past Surgical History:  Procedure Laterality Date  . CARDIAC ELECTROPHYSIOLOGY STUDY AND ABLATION  08/21/2011  . COLONOSCOPY WITH PROPOFOL N/A 06/26/2017   Procedure: COLONOSCOPY WITH PROPOFOL;  Surgeon: Manya Silvas, MD;  Location[facility] ENDOSCOPY;  Service: Endoscopy;  Laterality: N/A;  . ESOPHAGOGASTRODUODENOSCOPY (EGD) WITH PROPOFOL N/A 06/26/2017   Procedure: ESOPHAGOGASTRODUODENOSCOPY (EGD) WITH PROPOFOL;  Surgeon: Manya Silvas, MD;  Location: [facility] ENDOSCOPY;  Service: Endoscopy;  Laterality: N/A;  . SEPTOPLASTY N/A 03/22/2015   Procedure: SEPTOPLASTY  WITH RIGHT INFERIOR TURBINATE REDUCTION;  Surgeon: Margaretha Sheffield, MD;  Location: [facility];  Service: ENT;  Laterality: N/A;  . SUPRAVENTRICULAR TACHYCARDIA ABLATION N/A 08/21/2011   Procedure: SUPRAVENTRICULAR TACHYCARDIA ABLATION;  Surgeon: Evans Lance, MD;  Location[facility] CATH LAB;  Service: Cardiovascular;  Laterality: N/A;  . surgery for non descending testicle    . TONSILLECTOMY      Family History  Problem Relation Age of Onset  . Diabetes Other   . Cancer Other   . Breast cancer Maternal Grandmother   . Cancer Maternal Grandfather   . Aortic stenosis Maternal  Grandfather     SOCIAL HX: reviewed.    Current Outpatient Medications:  .  apixaban (ELIQUIS) 5 MG TABS tablet, Take 1 tablet (5 mg total) by mouth 2 (two) times daily., Disp: 180 tablet, Rfl: 1 .  atorvastatin (LIPITOR) 40 MG tablet, Take 1 tablet (40 mg total) by mouth daily., Disp: 30 tablet, Rfl: 2 .  cetirizine (ZYRTEC) 10 MG tablet, Take 10 mg by mouth daily. , Disp: , Rfl:  .  fluticasone (FLONASE) 50 MCG/ACT nasal spray, Place 2 sprays into both nostrils daily as needed  for allergies or rhinitis., Disp: , Rfl:  .  hydrochlorothiazide (MICROZIDE) 12.5 MG capsule, TAKE 1 CAPSULE (12.5 MG TOTAL) BY MOUTH DAILY., Disp: 30 capsule, Rfl: 9 .  Lancets (ONETOUCH DELICA PLUS [id number]) MISC, USE TWICE DAILY AS DIRECTED, Disp: 100 each, Rfl: 7 .  lisinopril (ZESTRIL) 10 MG tablet, Take 1 tablet (10 mg total) by mouth daily., Disp: 30 tablet, Rfl: 2 .  metFORMIN (GLUCOPHAGE) 1000 MG tablet, Take 1 tablet (1,000 mg total) by mouth 2 (two) times daily with a meal., Disp: 180 tablet, Rfl: 1 .  metoprolol succinate (TOPROL-XL) 25 MG 24 hr tablet, TAKE 1 TABLET (25 MG TOTAL) BY MOUTH DAILY., Disp: 30 tablet, Rfl: 11 .  NON FORMULARY, as directed. CPAP, Disp: , Rfl:  .  ONETOUCH VERIO test strip, USE TWICE DAILY AS DIRECTED, Disp: 50 strip, Rfl: 31  EXAM:  VITALS per patient if applicable: 366/44  GENERAL: alert, oriented, appears well and in no acute distress  HEENT: atraumatic, conjunttiva clear, no obvious abnormalities on inspection of external nose and ears  NECK: normal movements of the head and neck  LUNGS: on inspection no signs of respiratory distress, breathing rate appears normal, no obvious gross SOB, gasping or wheezing  CV: no obvious cyanosis  PSYCH/NEURO: pleasant and cooperative, no obvious depression or anxiety, speech and thought processing grossly intact  ASSESSMENT AND PLAN:  Discussed the following assessment and plan:  Aortic valve stenosis Followed by cardiology.   Atrial fibrillation ([place]) Followed by cardiology.  On eliquis.  Stable.    Environmental allergies Controlled.    Essential hypertension Blood pressure under good control.  Continue same medication regimen.  Follow pressures.  Follow metabolic panel.    Hyperlipidemia Low cholesterol diet and exercise.  Follow lipid panel and liver function tests.  On lipitor.    Sleep apnea CPAP - uses regularly.    Type 2 diabetes mellitus with hyperglycemia (HCC) Sugars as  outlined.  Overall appear to be improved. On metformin.  Follow met b and a1c.  Has adjusted diet and lost weight.      I discussed the assessment and treatment plan with the patient. The patient was provided an opportunity to ask questions and all were answered. The patient agreed with the plan and demonstrated an understanding of the instructions.   The patient was advised to call back or seek an in-person evaluation if the symptoms worsen or if the condition fails to improve as anticipated.   Einar Pheasant, MD

## 2019-05-01 ENCOUNTER — Encounter: Payer: Self-pay | Admitting: Internal Medicine

## 2019-05-01 NOTE — Assessment & Plan Note (Signed)
Followed by cardiology 

## 2019-05-01 NOTE — Assessment & Plan Note (Signed)
Sugars as outlined.  Overall appear to be improved. On metformin.  Follow met b and a1c.  Has adjusted diet and lost weight.

## 2019-05-01 NOTE — Assessment & Plan Note (Signed)
Followed by cardiology.  On eliquis.  Stable.   

## 2019-05-01 NOTE — Assessment & Plan Note (Signed)
CPAP - uses regularly.

## 2019-05-01 NOTE — Assessment & Plan Note (Signed)
Low cholesterol diet and exercise.  Follow lipid panel and liver function tests.  On lipitor.   

## 2019-05-01 NOTE — Assessment & Plan Note (Signed)
Blood pressure under good control.  Continue same medication regimen.  Follow pressures.  Follow metabolic panel.   

## 2019-05-01 NOTE — Assessment & Plan Note (Signed)
Controlled.  

## 2019-05-02 NOTE — Telephone Encounter (Signed)
LMTCB

## 2019-05-04 NOTE — Telephone Encounter (Signed)
I am not sure if I am going to be able to do a 12:00 visit on Friday.  We have a virtual meeting 12:15-1:15.  May have to do a virtual visit later in the day if he is able.  See me about this.

## 2019-05-04 NOTE — Telephone Encounter (Signed)
Spoke with patients wife. The areas he is wanting you to look at is the knots on his inner thighs. I was not sure if you would need to see this in person or virtually. I scheduled him for an in office visit at 12 on 11/6. He can only come on fridays. He has lost about 30 pounds, wasn't sure if that could contribute to the fatty tissue. They do not hurt. He was covid positive 9/23 and has completed quarantine and returned to work. Are you ok with doing an in office visit with him? He passed screening questions.

## 2019-05-06 NOTE — Telephone Encounter (Signed)
Appt is 11/6 and will decide today if appt needs to be virtual or in office

## 2019-05-12 NOTE — Progress Notes (Signed)
Cardiology Office Note:    Date:  05/13/2019   ID:  Dalton [name], DOB November 03, 1965, MRN [id number]  PCP:  Einar Pheasant, MD  Cardiologist:  Sinclair Grooms, MD   Referring MD: Einar Pheasant, MD   Chief Complaint  Patient presents with  . Cardiac Valve Problem    Aortic valve disease    History of Present Illness:    Dalton [name] is a 53 y.o. male with a hx of biscupid aortic valve with AS, prior SVT s/p RF ablation, HTN, HLD who presents for follow-up. Last echo 12/ 2019 EF 50 to 55%:, grade 1 DD, severely calcified bicuspid AV leaflets, moderate aortic stenosis with moderate AI, mildly dilated ascending aorta.  He is accompanied by his wife.  He feels like he is stable.  He still does all of his yard work.  He denies orthopnea, PND, and chest pain.  He has not having peripheral edema.  He still does long-haul driving in a big rig.  No limitations.  His wife can send that he seems more short of breath to her with activities that he could previously perform without much difficulty.  He otherwise seems to be doing okay.  He is compliant with CPAP.  Past Medical History:  Diagnosis Date  . Aortic valve stenosis    a. Bicuspid AV - 2D Echo 06/2014 - mod AS, mild AI, mildly dilated aortic root.  [name] CAD (coronary artery disease)   . Carotid artery disease ([place])    a. Mild by duplex 05/2015 - 1-39% BICA. Repeat due 05/2017.  . Dilated aortic root ([place])    a. By echo 06/2014.  . Environmental allergies   . Hypercholesterolemia   . Hypertension   . Obesity (BMI 30-39.9) 04/26/2018  . Sleep apnea   . SVT (supraventricular tachycardia) ([place])    a. h/o SVT - Ablation done 2013.    Past Surgical History:  Procedure Laterality Date  . CARDIAC ELECTROPHYSIOLOGY STUDY AND ABLATION  08/21/2011  . COLONOSCOPY WITH PROPOFOL N/A 06/26/2017   Procedure: COLONOSCOPY WITH PROPOFOL;  Surgeon: Manya Silvas, MD;  Location: [facility] ENDOSCOPY;  Service: Endoscopy;  Laterality: N/A;   . ESOPHAGOGASTRODUODENOSCOPY (EGD) WITH PROPOFOL N/A 06/26/2017   Procedure: ESOPHAGOGASTRODUODENOSCOPY (EGD) WITH PROPOFOL;  Surgeon: Manya Silvas, MD;  Location: [facility] ENDOSCOPY;  Service: Endoscopy;  Laterality: N/A;  . SEPTOPLASTY N/A 03/22/2015   Procedure: SEPTOPLASTY  WITH RIGHT INFERIOR TURBINATE REDUCTION;  Surgeon: Margaretha Sheffield, MD;  Location: [place];  Service: ENT;  Laterality: N/A;  . SUPRAVENTRICULAR TACHYCARDIA ABLATION N/A 08/21/2011   Procedure: SUPRAVENTRICULAR TACHYCARDIA ABLATION;  Surgeon: Evans Lance, MD;  Location: [facility] CATH LAB;  Service: Cardiovascular;  Laterality: N/A;  . surgery for non descending testicle    . TONSILLECTOMY      Current Medications: Current Meds  Medication Sig  . apixaban (ELIQUIS) 5 MG TABS tablet Take 1 tablet (5 mg total) by mouth 2 (two) times daily.  [name] atorvastatin (LIPITOR) 40 MG tablet Take 1 tablet (40 mg total) by mouth daily.  . cetirizine (ZYRTEC) 10 MG tablet Take 10 mg by mouth daily.   . fluticasone (FLONASE) 50 MCG/ACT nasal spray Place 2 sprays into both nostrils daily as needed for allergies or rhinitis.  . hydrochlorothiazide (MICROZIDE) 12.5 MG capsule TAKE 1 CAPSULE (12.5 MG TOTAL) BY MOUTH DAILY.  [name] Lancets (ONETOUCH DELICA PLUS 123XX123) MISC USE TWICE DAILY AS DIRECTED  . lisinopril (ZESTRIL) 10 MG tablet Take 1 tablet (10  mg total) by mouth daily.  . metFORMIN (GLUCOPHAGE) 1000 MG tablet Take 1 tablet (1,000 mg total) by mouth 2 (two) times daily with a meal.  . metoprolol succinate (TOPROL-XL) 25 MG 24 hr tablet TAKE 1 TABLET (25 MG TOTAL) BY MOUTH DAILY.  . NON FORMULARY as directed. CPAP  . ONETOUCH VERIO test strip USE TWICE DAILY AS DIRECTED     Allergies:   Tomato   Social History   Socioeconomic History  . Marital status: Married    Spouse name: Not on file  . Number of children: 2  . Years of education: Not on file  . Highest education level: Not on file  Occupational History  .  Occupation: truck Education administrator: Insurance account manager  Social Needs  . Financial resource strain: Not on file  . Food insecurity    Worry: Not on file    Inability: Not on file  . Transportation needs    Medical: Not on file    Non-medical: Not on file  Tobacco Use  . Smoking status: Former Smoker    Types: Cigarettes    Quit date: 11/04/1988    Years since quitting: 30.5  . Smokeless tobacco: Never Used  Substance and Sexual Activity  . Alcohol use: Yes    Alcohol/week: 8.0 standard drinks    Types: 7 Glasses of wine, 1 Shots of liquor per week    Comment: occasionally  . Drug use: Not Currently    Comment: many years ago in highschool  . Sexual activity: Not Currently  Lifestyle  . Physical activity    Days per week: Not on file    Minutes per session: Not on file  . Stress: Not on file  Relationships  . Social Herbalist on phone: Not on file    Gets together: Not on file    Attends religious service: Not on file    Active member of club or organization: Not on file    Attends meetings of clubs or organizations: Not on file    Relationship status: Not on file  Other Topics Concern  . Not on file  Social History Narrative   Very rare exercise     Family History: The patient's family history includes Aortic stenosis in his maternal grandfather; Breast cancer in his maternal grandmother; Cancer in his maternal grandfather and another family member; Diabetes in an other family member.  ROS:   Please see the history of present illness.    He sleeps easily.  Wears CPAP.  Has lost from 292 down to 268 pounds.  He is able to push his lawn mower.  He is able to weed eat.  All other systems reviewed and are negative.  EKGs/Labs/Other Studies Reviewed:    The following studies were reviewed today: Echocardiogram 2017 with LVIDD 5.8 cm LV ISD 4.3 cm and EF 50 to 55% Echocardiogram 2019 with    "        5.1 cm     "      4.1 cm, and EF 50 to 55% Echocardiogram 2020  with LVIDD 6.2 cm, LV ISD 5.0 cm, and EF 45 to 50%.  2D Doppler echocardiogram 03/23/2019 IMPRESSIONS    1. Left ventricular ejection fraction, by visual estimation, is 45 to 50%. The left ventricle has mildly decreased function. Normal left ventricular size. Left ventricular septal wall thickness was normal. There is mildly increased left ventricular  hypertrophy.  2. Definity contrast agent was  given IV to delineate the left ventricular endocardial borders.  3. Inferior and septal wall hypokinesis.  4. Global right ventricle has normal systolic function.The right ventricular size is normal. No increase in right ventricular wall thickness.  5. Left atrial size was moderately dilated.  6. Right atrial size was normal.  7. The mitral valve is normal in structure. Trace mitral valve regurgitation. No evidence of mitral stenosis.  8. The tricuspid valve is normal in structure. Tricuspid valve regurgitation is mild.  9. The aortic valve is bicuspid Aortic valve regurgitation is mild to moderate by color flow Doppler. Moderate aortic valve stenosis. 10. The pulmonic valve was grossly normal. Pulmonic valve regurgitation is mild by color flow Doppler. 11. Normal pulmonary artery systolic pressure.  EKG:  EKG atrial fibrillation, rate 98 bpm, occasional PVC, prominent voltage.  No significant change compared to prior.  Recent Labs: 05/14/2018: TSH 1.56 01/28/2019: ALT 37; BUN 17; Creatinine, Ser 1.03; Potassium 4.3; Sodium 138 03/25/2019: Hemoglobin 14.1; Platelets 152  Recent Lipid Panel    Component Value Date/Time   CHOL 147 01/28/2019 0754   TRIG 181.0 (H) 01/28/2019 0754   HDL 35.50 (L) 01/28/2019 0754   CHOLHDL 4 01/28/2019 0754   VLDL 36.2 01/28/2019 0754   LDLCALC 75 01/28/2019 0754   LDLDIRECT 228.0 05/14/2018 0951    Physical Exam:    VS:  BP 128/74   Pulse 98   Ht 6\' 1"  (1.854 m)   Wt 272 lb 12.8 oz (123.7 kg)   SpO2 96%   BMI 35.99 kg/m     Wt Readings from Last 3  Encounters:  05/13/19 272 lb 12.8 oz (123.7 kg)  04/29/19 265 lb (120.2 kg)  04/08/19 263 lb (119.3 kg)     GEN: Young in appearance. No acute distress HEENT: Normal NECK: No JVD. LYMPHATICS: No lymphadenopathy CARDIAC: Irregularly irregular RR with 3/6 crescendo decrescendo systolic and 1/6 to 2/6 decrescendo diastolic aortic valve murmurs, gallop, or edema. VASCULAR:  Normal Pulses. No bruits. RESPIRATORY:  Clear to auscultation without rales, wheezing or rhonchi  ABDOMEN: Soft, non-tender, non-distended, No pulsatile mass, MUSCULOSKELETAL: No deformity  SKIN: Warm and dry NEUROLOGIC:  Alert and oriented x 3 PSYCHIATRIC:  Normal affect   ASSESSMENT:    1. Nonrheumatic aortic valve stenosis   2. LVH (left ventricular hypertrophy)   3. Essential hypertension   4. Chronic anticoagulation   5. Coronary artery disease of native artery of native heart with stable angina pectoris ([place])   6. On anticoagulant therapy   7. Obstructive sleep apnea syndrome   8. Hyperlipidemia, unspecified hyperlipidemia type   9. Educated about COVID-19 virus infection    PLAN:    In order of problems listed above:  1. Mixed aortic stenosis and aortic regurgitation.  We had discussion concerning the most recent echo with findings suggesting LV enlargement.  LV end-systolic dimension is 5.1 cm.  Murmur and severity of aortic valve lesions has not significantly changed compared to prior.  EF is now slightly lower at around 45 to 50%.  Plan return in 6 months or if symptoms.  Repeat echo will be done in front of the next office visit.  We engaged in conversation concerning valve replacement with the patient understanding that he will undergo coronary angiography and referral to the aortic valve clinic at the appropriate time. 2. See echo report   Medication Adjustments/Labs and Tests Ordered: Current medicines are reviewed at length with the patient today.  Concerns regarding medicines are outlined above.  Orders Placed This Encounter  Procedures  . EKG 12-Lead  . ECHOCARDIOGRAM COMPLETE   No orders of the defined types were placed in this encounter.   Patient Instructions  Medication Instructions:  Your physician recommends that you continue on your current medications as directed. Please refer to the Current Medication list given to you today.  *If you need a refill on your cardiac medications before your next appointment, please call your pharmacy*  Lab Work: None If you have labs (blood work) drawn today and your tests are completely normal, you will receive your results only by: [name] MyChart Message (if you have MyChart) OR . A paper copy in the mail If you have any lab test that is abnormal or we need to change your treatment, we will call you to review the results.  Testing/Procedures: Your physician has requested that you have an echocardiogram in mid to late March 2021. Echocardiography is a painless test that uses sound waves to create images of your heart. It provides your doctor with information about the size and shape of your heart and how well your heart's chambers and valves are working. This procedure takes approximately one hour. There are no restrictions for this procedure.    Follow-Up: At [facility], you and your health needs are our priority.  As part of our continuing mission to provide you with exceptional heart care, we have created designated Provider Care Teams.  These Care Teams include your primary Cardiologist (physician) and Advanced Practice Providers (APPs -  Physician Assistants and Nurse Practitioners) who all work together to provide you with the care you need, when you need it.  Your next appointment:   March 2021 after echo  The format for your next appointment:   In Person  Provider:   You may see Sinclair Grooms, MD or one of the following Advanced Practice Providers on your designated Care Team:    Truitt Merle, NP  Cecilie Kicks, NP   Kathyrn Drown, NP   Other Instructions      Signed, Sinclair Grooms, MD  05/13/2019 10:59 AM    [place]

## 2019-05-13 ENCOUNTER — Ambulatory Visit: Payer: Managed Care, Other (non HMO) | Admitting: Internal Medicine

## 2019-05-13 ENCOUNTER — Encounter: Payer: Self-pay | Admitting: *Deleted

## 2019-05-13 ENCOUNTER — Encounter: Payer: Self-pay | Admitting: Interventional Cardiology

## 2019-05-13 ENCOUNTER — Other Ambulatory Visit: Payer: Self-pay

## 2019-05-13 ENCOUNTER — Ambulatory Visit (INDEPENDENT_AMBULATORY_CARE_PROVIDER_SITE_OTHER): Payer: Managed Care, Other (non HMO) | Admitting: Interventional Cardiology

## 2019-05-13 VITALS — BP 128/74 | HR 98 | Ht 73.0 in | Wt 272.8 lb

## 2019-05-13 DIAGNOSIS — I10 Essential (primary) hypertension: Secondary | ICD-10-CM | POA: Diagnosis not present

## 2019-05-13 DIAGNOSIS — I350 Nonrheumatic aortic (valve) stenosis: Secondary | ICD-10-CM | POA: Diagnosis not present

## 2019-05-13 DIAGNOSIS — I25118 Atherosclerotic heart disease of native coronary artery with other forms of angina pectoris: Secondary | ICD-10-CM | POA: Diagnosis not present

## 2019-05-13 DIAGNOSIS — G4733 Obstructive sleep apnea (adult) (pediatric): Secondary | ICD-10-CM

## 2019-05-13 DIAGNOSIS — Z7189 Other specified counseling: Secondary | ICD-10-CM

## 2019-05-13 DIAGNOSIS — I517 Cardiomegaly: Secondary | ICD-10-CM

## 2019-05-13 DIAGNOSIS — Z7901 Long term (current) use of anticoagulants: Secondary | ICD-10-CM

## 2019-05-13 DIAGNOSIS — E785 Hyperlipidemia, unspecified: Secondary | ICD-10-CM

## 2019-05-13 NOTE — Patient Instructions (Signed)
Medication Instructions:  Your physician recommends that you continue on your current medications as directed. Please refer to the Current Medication list given to you today.  *If you need a refill on your cardiac medications before your next appointment, please call your pharmacy*  Lab Work: None If you have labs (blood work) drawn today and your tests are completely normal, you will receive your results only by: [name] MyChart Message (if you have MyChart) OR . A paper copy in the mail If you have any lab test that is abnormal or we need to change your treatment, we will call you to review the results.  Testing/Procedures: Your physician has requested that you have an echocardiogram in mid to late March 2021. Echocardiography is a painless test that uses sound waves to create images of your heart. It provides your doctor with information about the size and shape of your heart and how well your heart's chambers and valves are working. This procedure takes approximately one hour. There are no restrictions for this procedure.    Follow-Up: At [facility] you and your health needs are our priority.  As part of our continuing mission to provide you with exceptional heart care, we have created designated Provider Care Teams.  These Care Teams include your primary Cardiologist (physician) and Advanced Practice Providers (APPs -  Physician Assistants and Nurse Practitioners) who all work together to provide you with the care you need, when you need it.  Your next appointment:   March 2021 after echo  The format for your next appointment:   In Person  Provider:   You may see Sinclair Grooms, MD or one of the following Advanced Practice Providers on your designated Care Team:    Truitt Merle, NP  Cecilie Kicks, NP  Kathyrn Drown, NP   Other Instructions

## 2019-05-16 ENCOUNTER — Encounter: Payer: Self-pay | Admitting: Neurology

## 2019-05-24 ENCOUNTER — Other Ambulatory Visit: Payer: Self-pay | Admitting: Interventional Cardiology

## 2019-05-24 ENCOUNTER — Other Ambulatory Visit: Payer: Self-pay | Admitting: Internal Medicine

## 2019-05-24 MED ORDER — METOPROLOL SUCCINATE ER 25 MG PO TB24: 25.0000 mg | ORAL_TABLET | Freq: Every day | ORAL | 3 refills | Status: DC

## 2019-05-24 MED ORDER — APIXABAN 5 MG PO TABS: 5.0000 mg | ORAL_TABLET | Freq: Two times a day (BID) | ORAL | 1 refills | Status: DC

## 2019-05-24 MED ORDER — ONETOUCH VERIO VI STRP: ORAL_STRIP | 31 refills | Status: DC

## 2019-05-24 MED ORDER — METFORMIN HCL 1000 MG PO TABS: 1000.0000 mg | ORAL_TABLET | Freq: Two times a day (BID) | ORAL | 1 refills | Status: DC

## 2019-05-24 MED ORDER — HYDROCHLOROTHIAZIDE 12.5 MG PO CAPS: 12.5000 mg | ORAL_CAPSULE | Freq: Every day | ORAL | 3 refills | Status: DC

## 2019-05-24 NOTE — Telephone Encounter (Signed)
Medication Refill - Medication: lisinopril (ZESTRIL) 10 MG tablet Lancets (ONETOUCH DELICA PLUS 123XX123) MISC ONETOUCH VERIO test strip atorvastatin (LIPITOR) 40 MG tablet metFORMIN (GLUCOPHAGE) 1000 MG tablet     Has the patient contacted their pharmacy? Yes.   (Agent: If no, request that the patient contact the pharmacy for the refill.) (Agent: If yes, when and what did the pharmacy advise?)  Preferred Pharmacy (with phone number or street name):  [facility] [address] [place] [address]  Phone: [phone] Fax: [phone]     Agent: Please be advised that RX refills may take up to 3 business days. We ask that you follow-up with your pharmacy.

## 2019-05-24 NOTE — Telephone Encounter (Signed)
Prescription refill request for Eliquis received.  Last office visit: Smith, 05-13-2019 Scr: 1.03, 01-28-2019 Age: 53 y.o. Weight: 123.7 kg  Prescription refill sent.

## 2019-05-25 ENCOUNTER — Encounter: Payer: Self-pay | Admitting: Internal Medicine

## 2019-05-25 MED ORDER — LISINOPRIL 10 MG PO TABS: 10.0000 mg | ORAL_TABLET | Freq: Every day | ORAL | 2 refills | Status: DC

## 2019-05-25 MED ORDER — ATORVASTATIN CALCIUM 40 MG PO TABS: 40.0000 mg | ORAL_TABLET | Freq: Every day | ORAL | 2 refills | Status: DC

## 2019-05-25 MED ORDER — ONETOUCH DELICA PLUS LANCET33G MISC: 1.0000 | Freq: Two times a day (BID) | 7 refills | Status: DC

## 2019-05-25 NOTE — Telephone Encounter (Unsigned)
Copied from [place] [id number]. Topic: General - Inquiry >> May 25, 2019  9:11 AM Richardo Priest, NT wrote: Reason for CRM: [place] called in in regards to glucose blood (ONETOUCH VERIO) test strips. Pharmacy is needing clarification on refills as it says there are 31 refills for it. Please advise and call back is [phone].

## 2019-06-07 ENCOUNTER — Other Ambulatory Visit: Payer: Self-pay

## 2019-06-07 ENCOUNTER — Encounter: Payer: Self-pay | Admitting: Internal Medicine

## 2019-06-08 ENCOUNTER — Other Ambulatory Visit: Payer: Self-pay

## 2019-06-09 ENCOUNTER — Other Ambulatory Visit: Payer: Self-pay

## 2019-06-09 ENCOUNTER — Other Ambulatory Visit (INDEPENDENT_AMBULATORY_CARE_PROVIDER_SITE_OTHER): Payer: Managed Care, Other (non HMO)

## 2019-06-09 DIAGNOSIS — E1165 Type 2 diabetes mellitus with hyperglycemia: Secondary | ICD-10-CM | POA: Diagnosis not present

## 2019-06-09 DIAGNOSIS — I10 Essential (primary) hypertension: Secondary | ICD-10-CM

## 2019-06-09 DIAGNOSIS — E785 Hyperlipidemia, unspecified: Secondary | ICD-10-CM | POA: Diagnosis not present

## 2019-06-09 LAB — HEPATIC FUNCTION PANEL
ALT: 31 U/L (ref 0–53)
AST: 19 U/L (ref 0–37)
Albumin: 4.5 g/dL (ref 3.5–5.2)
Alkaline Phosphatase: 71 U/L (ref 39–117)
Bilirubin, Direct: 0.2 mg/dL (ref 0.0–0.3)
Total Bilirubin: 0.8 mg/dL (ref 0.2–1.2)
Total Protein: 6.6 g/dL (ref 6.0–8.3)

## 2019-06-09 LAB — MICROALBUMIN / CREATININE URINE RATIO
Creatinine,U: 129.7 mg/dL
Microalb Creat Ratio: 0.8 mg/g (ref 0.0–30.0)
Microalb, Ur: 1.1 mg/dL (ref 0.0–1.9)

## 2019-06-09 LAB — LIPID PANEL
Cholesterol: 140 mg/dL (ref 0–200)
HDL: 31.6 mg/dL — ABNORMAL LOW (ref >39.00)
LDL Cholesterol: 80 mg/dL (ref 0–99)
NonHDL: 108.51
Total CHOL/HDL Ratio: 4
Triglycerides: 144 mg/dL (ref 0.0–149.0)
VLDL: 28.8 mg/dL (ref 0.0–40.0)

## 2019-06-09 LAB — BASIC METABOLIC PANEL
BUN: 15 mg/dL (ref 6–23)
CO2: 27 mEq/L (ref 19–32)
Calcium: 9.3 mg/dL (ref 8.4–10.5)
Chloride: 103 mEq/L (ref 96–112)
Creatinine, Ser: 0.9 mg/dL (ref 0.40–1.50)
GFR: 88.03 mL/min (ref >60.00)
Glucose, Bld: 118 mg/dL — ABNORMAL HIGH (ref 70–99)
Potassium: 4.2 mEq/L (ref 3.5–5.1)
Sodium: 139 mEq/L (ref 135–145)

## 2019-06-09 LAB — HEMOGLOBIN A1C: Hgb A1c MFr Bld: 7.2 % — ABNORMAL HIGH (ref 4.6–6.5)

## 2019-06-09 LAB — TSH: TSH: 1.22 u[IU]/mL (ref 0.35–4.50)

## 2019-06-10 ENCOUNTER — Other Ambulatory Visit: Payer: Managed Care, Other (non HMO)

## 2019-06-10 ENCOUNTER — Encounter: Payer: Self-pay | Admitting: Internal Medicine

## 2019-07-04 ENCOUNTER — Other Ambulatory Visit: Payer: Self-pay | Admitting: Internal Medicine

## 2019-07-05 ENCOUNTER — Encounter: Payer: Self-pay | Admitting: Internal Medicine

## 2019-08-16 ENCOUNTER — Encounter: Payer: Self-pay | Admitting: Internal Medicine

## 2019-08-16 DIAGNOSIS — F4310 Post-traumatic stress disorder, unspecified: Secondary | ICD-10-CM

## 2019-08-16 NOTE — Telephone Encounter (Signed)
Order placed for psychiatry referral.   °

## 2019-09-09 ENCOUNTER — Encounter: Payer: Managed Care, Other (non HMO) | Admitting: Internal Medicine

## 2019-09-16 ENCOUNTER — Other Ambulatory Visit (HOSPITAL_COMMUNITY): Payer: Managed Care, Other (non HMO)

## 2019-09-20 ENCOUNTER — Telehealth: Payer: Self-pay | Admitting: Interventional Cardiology

## 2019-09-20 NOTE — Telephone Encounter (Signed)
Attempted to call patient's wife back. Unable to leave voicemail.

## 2019-09-20 NOTE — Telephone Encounter (Signed)
  Pt's wife called, she said she needs to come in with the pt to his appt on 09/23/19, she said she needs to be there to discuss possible surgery for the pt.  Please advise

## 2019-09-20 NOTE — Telephone Encounter (Signed)
I spoke to the patient's wife and she wants to come to OV with Dr Tamala Julian on 3/19.  I explained our protocol, but she said that Dr Tamala Julian said that she could "come in case the patient needs surgery".  I told her that I would forward to Dr Thompson Caul nurse for further advisement.  She verbalized understanding.

## 2019-09-21 NOTE — Telephone Encounter (Signed)
I will send as FYI to Marveen Reeks RN for Dr. Tamala Julian.

## 2019-09-21 NOTE — Telephone Encounter (Signed)
Attempted to contact wife and let her know that it is ok for her to come up with pt.  Phone rang several times and sounded like someone picked up but was unable to hear anyone.  Will try again later.

## 2019-09-21 NOTE — Telephone Encounter (Signed)
I have made patient's wife aware that she is eligible to accompany the patient during his appointment scheduled for 09/23/19.

## 2019-09-22 NOTE — Progress Notes (Signed)
Cardiology Office Note:    Date:  09/23/2019   ID:  Dalton [name], DOB 01-30-1966, MRN [id number]  PCP:  Einar Pheasant, MD  Cardiologist:  Sinclair Grooms, MD   Referring MD: Einar Pheasant, MD   Chief Complaint  Patient presents with  . Shortness of Breath  . Cardiac Valve Problem    History of Present Illness:    Dalton [name] is a 54 y.o. male with a hx of biscupid aortic valve with AS, prior SVT s/p RF ablation, HTN, HLD who presents for follow-up. Last echo 12/ 2019 EF 50 to 55%:, grade 1 DD, severely calcified bicuspid AV leaflets, moderate aortic stenosis with moderate AI, mildly dilated ascending aorta.  He feels well.  He denies shortness of breath, edema, chest pain, syncope, and orthopnea.  His job is changed.  He now drives a box truck, has to deliver packages, and gets a lot of repetitive walking and.  Therefore, he is lost weight, feels better, and has changed his diet.  Past Medical History:  Diagnosis Date  . Aortic valve stenosis    a. Bicuspid AV - 2D Echo 06/2014 - mod AS, mild AI, mildly dilated aortic root.  [name] CAD (coronary artery disease)   . Carotid artery disease ([place])    a. Mild by duplex 05/2015 - 1-39% BICA. Repeat due 05/2017.  . Dilated aortic root ([place])    a. By echo 06/2014.  . Environmental allergies   . Hypercholesterolemia   . Hypertension   . Obesity (BMI 30-39.9) 04/26/2018  . Sleep apnea   . SVT (supraventricular tachycardia) ([place])    a. h/o SVT - Ablation done 2013.    Past Surgical History:  Procedure Laterality Date  . CARDIAC ELECTROPHYSIOLOGY STUDY AND ABLATION  08/21/2011  . COLONOSCOPY WITH PROPOFOL N/A 06/26/2017   Procedure: COLONOSCOPY WITH PROPOFOL;  Surgeon: Manya Silvas, MD;  Location: [facility] ENDOSCOPY;  Service: Endoscopy;  Laterality: N/A;  . ESOPHAGOGASTRODUODENOSCOPY (EGD) WITH PROPOFOL N/A 06/26/2017   Procedure: ESOPHAGOGASTRODUODENOSCOPY (EGD) WITH PROPOFOL;  Surgeon: Manya Silvas, MD;   Location: [facility] ENDOSCOPY;  Service: Endoscopy;  Laterality: N/A;  . SEPTOPLASTY N/A 03/22/2015   Procedure: SEPTOPLASTY  WITH RIGHT INFERIOR TURBINATE REDUCTION;  Surgeon: Margaretha Sheffield, MD;  Location: [place];  Service: ENT;  Laterality: N/A;  . SUPRAVENTRICULAR TACHYCARDIA ABLATION N/A 08/21/2011   Procedure: SUPRAVENTRICULAR TACHYCARDIA ABLATION;  Surgeon: Evans Lance, MD;  Location: [facility] CATH LAB;  Service: Cardiovascular;  Laterality: N/A;  . surgery for non descending testicle    . TONSILLECTOMY      Current Medications: Current Meds  Medication Sig  . apixaban (ELIQUIS) 5 MG TABS tablet Take 1 tablet (5 mg total) by mouth 2 (two) times daily.  [name] atorvastatin (LIPITOR) 40 MG tablet Take 1 tablet (40 mg total) by mouth daily.  . cetirizine (ZYRTEC) 10 MG tablet Take 10 mg by mouth daily.   . fluticasone (FLONASE) 50 MCG/ACT nasal spray Place 2 sprays into both nostrils daily as needed for allergies or rhinitis.  [name] glucose blood (ONETOUCH VERIO) test strip USE TWICE DAILY AS DIRECTED  . hydrochlorothiazide (MICROZIDE) 12.5 MG capsule Take 1 capsule (12.5 mg total) by mouth daily.  . Lancets (ONETOUCH DELICA PLUS 123XX123) MISC USE TWICE DAILY AS DIRECTED  . lisinopril (ZESTRIL) 10 MG tablet Take 1 tablet (10 mg total) by mouth daily.  . metFORMIN (GLUCOPHAGE) 1000 MG tablet Take 1 tablet (1,000 mg total) by mouth 2 (two) times daily  with a meal.  . metoprolol succinate (TOPROL-XL) 25 MG 24 hr tablet Take 1 tablet (25 mg total) by mouth daily.  . NON FORMULARY as directed. CPAP     Allergies:   Tomato   Social History   Socioeconomic History  . Marital status: Married    Spouse name: Not on file  . Number of children: 2  . Years of education: Not on file  . Highest education level: Not on file  Occupational History  . Occupation: truck Education administrator: Insurance account manager  Tobacco Use  . Smoking status: Former Smoker    Types: Cigarettes    Quit date: 11/04/1988     Years since quitting: 30.9  . Smokeless tobacco: Never Used  Substance and Sexual Activity  . Alcohol use: Yes    Alcohol/week: 8.0 standard drinks    Types: 7 Glasses of wine, 1 Shots of liquor per week    Comment: occasionally  . Drug use: Not Currently    Comment: many years ago in highschool  . Sexual activity: Not Currently  Other Topics Concern  . Not on file  Social History Narrative   Very rare exercise   Social Determinants of Health   Financial Resource Strain:   . Difficulty of Paying Living Expenses:   Food Insecurity:   . Worried About Charity fundraiser in the Last Year:   . Arboriculturist in the Last Year:   Transportation Needs:   . Film/video editor (Medical):   [name] Lack of Transportation (Non-Medical):   Physical Activity:   . Days of Exercise per Week:   . Minutes of Exercise per Session:   Stress:   . Feeling of Stress :   Social Connections:   . Frequency of Communication with Friends and Family:   . Frequency of Social Gatherings with Friends and Family:   . Attends Religious Services:   . Active Member of Clubs or Organizations:   . Attends Archivist Meetings:   [name] Marital Status:      Family History: The patient's family history includes Aortic stenosis in his maternal grandfather; Breast cancer in his maternal grandmother; Cancer in his maternal grandfather and another family member; Diabetes in an other family member.  ROS:   Please see the history of present illness.    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  All other systems reviewed and are negative.  EKGs/Labs/Other Studies Reviewed:    The following studies were reviewed today: Echocardiogram from today is pending.  I viewed it quickly in the office with the patient.  It looks essentially the same as 6 months ago based upon my interpretation by clinical decisions will be based on the official  interpretation by the reader.  EKG:  EKG not repeated  Recent Labs: 03/25/2019: Hemoglobin 14.1; Platelets 152 06/09/2019: ALT 31; BUN 15; Creatinine, Ser 0.90; Potassium 4.2; Sodium 139; TSH 1.22  Recent Lipid Panel    Component Value Date/Time   CHOL 140 06/09/2019 1313   TRIG 144.0 06/09/2019 1313   HDL 31.60 (L) 06/09/2019 1313   CHOLHDL 4 06/09/2019 1313   VLDL 28.8 06/09/2019 1313   LDLCALC 80 06/09/2019 1313   LDLDIRECT 228.0 05/14/2018 0951    Physical Exam:    VS:  BP 116/82   Pulse 76   Ht 6\' 1"  (1.854 m)   Wt 280 lb (127 kg)   SpO2 96%  BMI 36.94 kg/m     Wt Readings from Last 3 Encounters:  09/23/19 280 lb (127 kg)  05/13/19 272 lb 12.8 oz (123.7 kg)  04/29/19 265 lb (120.2 kg)     GEN: Morbid obesity but improving weight is actually increased from November.. No acute distress HEENT: Normal NECK: No JVD. LYMPHATICS: No lymphadenopathy CARDIAC: 2/6 crescendo decrescendo systolic and 2/6 decrescendo murmur.  IIRR without murmur, or edema. VASCULAR:  Normal Pulses. No bruits. RESPIRATORY:  Clear to auscultation without rales, wheezing or rhonchi  ABDOMEN: Soft, non-tender, non-distended, No pulsatile mass, MUSCULOSKELETAL: No deformity  SKIN: Warm and dry NEUROLOGIC:  Alert and oriented x 3 PSYCHIATRIC:  Normal affect   ASSESSMENT:    1. Nonrheumatic aortic valve stenosis   2. Coronary artery disease of native artery of native heart with stable angina pectoris ([place])   3. Essential hypertension   4. Chronic anticoagulation   5. Hyperlipidemia, unspecified hyperlipidemia type   6. Obstructive sleep apnea syndrome   7. On anticoagulant therapy   8. Educated about COVID-19 virus infection    PLAN:    In order of problems listed above:  1. Following combined aortic regurgitation and stenosis.  Awaiting official interpretation of study.  If we feel her EF is decreasing and aortic valve numbers are worsening (regurgitation and stenosis) may need to  start the process of considering valve therapy with surgery.  Will await official report. 2. Secondary prevention discussed. 3. Blood pressure is excellent with improved values. 4. On the current medical regimen with Eliquis.   5. Most recent LDL was 80 in December.  Continue statin.   6. Encouraged continuation of CPAP. 7. Continue Eliquis.  No bleeding complications.  Most recent hemoglobin and creatinine were acceptable and stable. 8. Both he and his wife had COVID-19 in September.  She has severe disease and has chronic shortness of breath.  I advised that they both get vaccinated.  They are both greater than 3 months out.  Overall education and awareness concerning primary/secondary risk prevention was discussed in detail: LDL less than 70, hemoglobin A1c less than 7, blood pressure target less than 130/80 mmHg, >150 minutes of moderate aerobic activity per week, avoidance of smoking, weight control (via diet and exercise), and continued surveillance/management of/for obstructive sleep apnea.  If there is significant variance and interpretation of the echo by the reader to compare with my impression, he may need to come back in 6 months with a repeat echo in 6 months.  We may also need to consider referral for valve therapy based upon this echo depending upon the reading.  Medication Adjustments/Labs and Tests Ordered: Current medicines are reviewed at length with the patient today.  Concerns regarding medicines are outlined above.  No orders of the defined types were placed in this encounter.  No orders of the defined types were placed in this encounter.   Patient Instructions  Medication Instructions:  Your physician recommends that you continue on your current medications as directed. Please refer to the Current Medication list given to you today.  *If you need a refill on your cardiac medications before your next appointment, please call your pharmacy*   Lab Work: None If you  have labs (blood work) drawn today and your tests are completely normal, you will receive your results only by: [name] MyChart Message (if you have MyChart) OR . A paper copy in the mail If you have any lab test that is abnormal or we need to change your  treatment, we will call you to review the results.   Testing/Procedures: Your physician has requested that you have an echocardiogram 1-2 weeks prior to seeing Dr. Tamala Julian back in a year. Echocardiography is a painless test that uses sound waves to create images of your heart. It provides your doctor with information about the size and shape of your heart and how well your heart's chambers and valves are working. This procedure takes approximately one hour. There are no restrictions for this procedure.     Follow-Up: At [facility], you and your health needs are our priority.  As part of our continuing mission to provide you with exceptional heart care, we have created designated Provider Care Teams.  These Care Teams include your primary Cardiologist (physician) and Advanced Practice Providers (APPs -  Physician Assistants and Nurse Practitioners) who all work together to provide you with the care you need, when you need it.  We recommend signing up for the patient portal called "MyChart".  Sign up information is provided on this After Visit Summary.  MyChart is used to connect with patients for Virtual Visits (Telemedicine).  Patients are able to view lab/test results, encounter notes, upcoming appointments, etc.  Non-urgent messages can be sent to your provider as well.   To learn more about what you can do with MyChart, go to [email or web address].    Your next appointment:   12 month(s)  The format for your next appointment:   In Person  Provider:   You may see Sinclair Grooms, MD or one of the following Advanced Practice Providers on your designated Care Team:    Truitt Merle, NP  Cecilie Kicks, NP  Kathyrn Drown, NP    Other  Instructions      Signed, Sinclair Grooms, MD  09/23/2019 10:45 AM    [place]

## 2019-09-23 ENCOUNTER — Encounter: Payer: Self-pay | Admitting: Internal Medicine

## 2019-09-23 ENCOUNTER — Other Ambulatory Visit: Payer: Self-pay

## 2019-09-23 ENCOUNTER — Ambulatory Visit (INDEPENDENT_AMBULATORY_CARE_PROVIDER_SITE_OTHER): Payer: Managed Care, Other (non HMO) | Admitting: Interventional Cardiology

## 2019-09-23 ENCOUNTER — Ambulatory Visit (HOSPITAL_COMMUNITY): Payer: Managed Care, Other (non HMO) | Attending: Internal Medicine

## 2019-09-23 ENCOUNTER — Encounter: Payer: Self-pay | Admitting: Interventional Cardiology

## 2019-09-23 VITALS — BP 116/82 | HR 76 | Ht 73.0 in | Wt 280.0 lb

## 2019-09-23 DIAGNOSIS — I517 Cardiomegaly: Secondary | ICD-10-CM | POA: Diagnosis not present

## 2019-09-23 DIAGNOSIS — G4733 Obstructive sleep apnea (adult) (pediatric): Secondary | ICD-10-CM

## 2019-09-23 DIAGNOSIS — I350 Nonrheumatic aortic (valve) stenosis: Secondary | ICD-10-CM

## 2019-09-23 DIAGNOSIS — I25118 Atherosclerotic heart disease of native coronary artery with other forms of angina pectoris: Secondary | ICD-10-CM

## 2019-09-23 DIAGNOSIS — I10 Essential (primary) hypertension: Secondary | ICD-10-CM | POA: Diagnosis not present

## 2019-09-23 DIAGNOSIS — E785 Hyperlipidemia, unspecified: Secondary | ICD-10-CM

## 2019-09-23 DIAGNOSIS — Z7901 Long term (current) use of anticoagulants: Secondary | ICD-10-CM | POA: Diagnosis not present

## 2019-09-23 DIAGNOSIS — Z7189 Other specified counseling: Secondary | ICD-10-CM

## 2019-09-23 MED ORDER — PERFLUTREN LIPID MICROSPHERE
1.0000 mL | INTRAVENOUS | Status: AC | PRN
  Administered 2019-09-23: 2 mL via INTRAVENOUS

## 2019-09-23 NOTE — Telephone Encounter (Signed)
Referral was placed on 2/9. Will you please follow up on this?

## 2019-09-23 NOTE — Patient Instructions (Signed)
Medication Instructions:  Your physician recommends that you continue on your current medications as directed. Please refer to the Current Medication list given to you today.  *If you need a refill on your cardiac medications before your next appointment, please call your pharmacy*   Lab Work: None If you have labs (blood work) drawn today and your tests are completely normal, you will receive your results only by: . MyChart Message (if you have MyChart) OR . A paper copy in the mail If you have any lab test that is abnormal or we need to change your treatment, we will call you to review the results.   Testing/Procedures: Your physician has requested that you have an echocardiogram 1-2 weeks prior to seeing Dr. Smith back in a year. Echocardiography is a painless test that uses sound waves to create images of your heart. It provides your doctor with information about the size and shape of your heart and how well your heart's chambers and valves are working. This procedure takes approximately one hour. There are no restrictions for this procedure.     Follow-Up: At [facility], you and your health needs are our priority.  As part of our continuing mission to provide you with exceptional heart care, we have created designated Provider Care Teams.  These Care Teams include your primary Cardiologist (physician) and Advanced Practice Providers (APPs -  Physician Assistants and Nurse Practitioners) who all work together to provide you with the care you need, when you need it.  We recommend signing up for the patient portal called "MyChart".  Sign up information is provided on this After Visit Summary.  MyChart is used to connect with patients for Virtual Visits (Telemedicine).  Patients are able to view lab/test results, encounter notes, upcoming appointments, etc.  Non-urgent messages can be sent to your provider as well.   To learn more about what you can do with MyChart, go to  [email or web address].    Your next appointment:   12 month(s)  The format for your next appointment:   In Person  Provider:   You may see Henry W Smith III, MD or one of the following Advanced Practice Providers on your designated Care Team:    Lori Gerhardt, NP  Laura Ingold, NP  Jill McDaniel, NP    Other Instructions   

## 2019-10-03 ENCOUNTER — Other Ambulatory Visit: Payer: Self-pay | Admitting: Internal Medicine

## 2019-10-15 ENCOUNTER — Encounter: Payer: Self-pay | Admitting: Internal Medicine

## 2019-10-31 ENCOUNTER — Telehealth: Payer: Self-pay | Admitting: Interventional Cardiology

## 2019-10-31 NOTE — Telephone Encounter (Signed)
Two FMLA/disability forms received from [employer]. Placed in box for Dr. Tamala Julian to review. 10/31/19 vlm

## 2019-11-09 NOTE — Progress Notes (Signed)
Cardiology Office Note:    Date:  11/11/2019   ID:  Dalton [name], DOB 05-03-1966, MRN [id number]  PCP:  Einar Pheasant, MD  Cardiologist:  Sinclair Grooms, MD   Referring MD: Einar Pheasant, MD   Chief Complaint  Patient presents with  . Congestive Heart Failure  . Advice Only    Obstructive sleep apnea/rule out central    History of Present Illness:    Dalton [name] is a 54 y.o. male with a hx of  biscupid aortic valve with AS, prior SVT s/p RF ablation, HTN, HLD who presents for follow-up. Last echo 06/2018 EF 50 to 55%:, grade 1 DD, severely calcifiedbicuspidAV leaflets, moderate aortic stenosis withmoderateAI, mildly dilated ascending aorta. Recent echo shows increasing LV cavity size.  Dalton [name] is doing relatively well and is certainly stable since our last conversation in March 2021.  The most recent 2D Doppler echocardiogram which was performed after the office visit demonstrates increase in LV volume in the setting of aortic valvular disease.  Please see be conclusions from the echo report below.  Because of these results I am concerned about progressive hemodynamic stress on the left ventricle and am advising he undergo left, right heart cath, and coronary angiography as a prelude to consideration of valve therapy.  Simultaneously, over the past year or 2 the patient has become increasingly fatigued, has increased daytime sleepiness, and is beginning to have concerned about the safety of his driving related to alertness.  He does have sleep apnea.  His wife points out that he occasionally appears not to breathe despite wearing CPAP.  He awakens feeling tired.  Past Medical History:  Diagnosis Date  . Aortic valve stenosis    a. Bicuspid AV - 2D Echo 06/2014 - mod AS, mild AI, mildly dilated aortic root.  [name] CAD (coronary artery disease)   . Carotid artery disease ([place])    a. Mild by duplex 05/2015 - 1-39% BICA. Repeat due 05/2017.  . Dilated aortic root ([place])     a. By echo 06/2014.  . Environmental allergies   . Hypercholesterolemia   . Hypertension   . Obesity (BMI 30-39.9) 04/26/2018  . Sleep apnea   . SVT (supraventricular tachycardia) ([place])    a. h/o SVT - Ablation done 2013.    Past Surgical History:  Procedure Laterality Date  . CARDIAC ELECTROPHYSIOLOGY STUDY AND ABLATION  08/21/2011  . COLONOSCOPY WITH PROPOFOL N/A 06/26/2017   Procedure: COLONOSCOPY WITH PROPOFOL;  Surgeon: Manya Silvas, MD;  Location: [facility] ENDOSCOPY;  Service: Endoscopy;  Laterality: N/A;  . ESOPHAGOGASTRODUODENOSCOPY (EGD) WITH PROPOFOL N/A 06/26/2017   Procedure: ESOPHAGOGASTRODUODENOSCOPY (EGD) WITH PROPOFOL;  Surgeon: Manya Silvas, MD;  Location: [facility] ENDOSCOPY;  Service: Endoscopy;  Laterality: N/A;  . SEPTOPLASTY N/A 03/22/2015   Procedure: SEPTOPLASTY  WITH RIGHT INFERIOR TURBINATE REDUCTION;  Surgeon: Margaretha Sheffield, MD;  Location: [place];  Service: ENT;  Laterality: N/A;  . SUPRAVENTRICULAR TACHYCARDIA ABLATION N/A 08/21/2011   Procedure: SUPRAVENTRICULAR TACHYCARDIA ABLATION;  Surgeon: Evans Lance, MD;  Location: [facility] CATH LAB;  Service: Cardiovascular;  Laterality: N/A;  . surgery for non descending testicle    . TONSILLECTOMY      Current Medications: Current Meds  Medication Sig  . acetaminophen (TYLENOL) 500 MG tablet Take 500-1,000 mg by mouth every 6 (six) hours as needed (for pain.).  [name] apixaban (ELIQUIS) 5 MG TABS tablet Take 1 tablet (5 mg total) by mouth 2 (two) times daily.  [name] atorvastatin (  LIPITOR) 40 MG tablet Take 1 tablet (40 mg total) by mouth daily.  . cetirizine (ZYRTEC) 10 MG tablet Take 10 mg by mouth 2 (two) times daily.   . fluticasone (FLONASE) 50 MCG/ACT nasal spray Place 2 sprays into both nostrils daily as needed for allergies or rhinitis.  [name] glucose blood (ONETOUCH VERIO) test strip USE TWICE DAILY AS DIRECTED  . hydrochlorothiazide (MICROZIDE) 12.5 MG capsule Take 1 capsule (12.5 mg total) by mouth  daily.  . Lancets (ONETOUCH DELICA PLUS 123XX123) MISC USE TWICE DAILY AS DIRECTED  . lisinopril (ZESTRIL) 10 MG tablet TAKE 1 TABLET BY MOUTH EVERY DAY (Patient taking differently: Take 10 mg by mouth daily. )  . metFORMIN (GLUCOPHAGE) 1000 MG tablet Take 1 tablet (1,000 mg total) by mouth 2 (two) times daily with a meal.  . metoprolol succinate (TOPROL-XL) 25 MG 24 hr tablet Take 1 tablet (25 mg total) by mouth daily.  . NON FORMULARY as directed. CPAP     Allergies:   Patient has no known allergies.   Social History   Socioeconomic History  . Marital status: Married    Spouse name: Not on file  . Number of children: 2  . Years of education: Not on file  . Highest education level: Not on file  Occupational History  . Occupation: truck Education administrator: Insurance account manager  Tobacco Use  . Smoking status: Former Smoker    Types: Cigarettes    Quit date: 11/04/1988    Years since quitting: 31.0  . Smokeless tobacco: Never Used  Substance and Sexual Activity  . Alcohol use: Yes    Alcohol/week: 8.0 standard drinks    Types: 7 Glasses of wine, 1 Shots of liquor per week    Comment: occasionally  . Drug use: Not Currently    Comment: many years ago in highschool  . Sexual activity: Not Currently  Other Topics Concern  . Not on file  Social History Narrative   Very rare exercise   Social Determinants of Health   Financial Resource Strain:   . Difficulty of Paying Living Expenses:   Food Insecurity:   . Worried About Charity fundraiser in the Last Year:   . Arboriculturist in the Last Year:   Transportation Needs:   . Film/video editor (Medical):   [name] Lack of Transportation (Non-Medical):   Physical Activity:   . Days of Exercise per Week:   . Minutes of Exercise per Session:   Stress:   . Feeling of Stress :   Social Connections:   . Frequency of Communication with Friends and Family:   . Frequency of Social Gatherings with Friends and Family:   . Attends  Religious Services:   . Active Member of Clubs or Organizations:   . Attends Archivist Meetings:   [name] Marital Status:      Family History: The patient's family history includes Aortic stenosis in his maternal grandfather; Breast cancer in his maternal grandmother; Cancer in his maternal grandfather and another family member; Diabetes in an other family member.  ROS:   Please see the history of present illness.    Excessive daytime sleepiness, snoring during sleep.  No chest discomfort.  Overall endurance and hospital condition is forcing changes in his job at [employer].  All other systems reviewed and are negative.  EKGs/Labs/Other Studies Reviewed:    The following studies were reviewed today:  2D Doppler Echocardiogram March 2021: IMPRESSIONS  1. Compared to echo from Sept 2020, there is no significant change.  2. Poor acoustic windows limit study Consider Definity to further define.  3. Left ventricular ejection fraction, by estimation, is 40 to 45%. The  left ventricle has mildly decreased function. The left ventricle  demonstrates global hypokinesis. There is mild left ventricular  hypertrophy. Left ventricular diastolic parameters  are indeterminate.  4. Right ventricular systolic function is mildly reduced. The right  ventricular size is normal.  5. The mitral valve is abnormal. Trivial mitral valve regurgitation.  6. AV is difficult to see. Peak and mean gradients through the valve are  27 and 16 mm Hg respectively LVO/AV VTI ratio is 0.27 consistent with  moderate AS. Consider TEE to further define. .. Aortic valve regurgitation  is mild. no significant change from  prevoius echo  7. The inferior vena cava is normal in size with greater than 50%  respiratory variability, suggesting right atrial pressure of 3 mmHg.    Compared to echocardiographic results over the past 4 years: 2021: LV internal diameter diastole 5.6 cm; LV posterior wall thickness 1.3  cm; LVEF 40 to 45%. 2020: LV internal diameter diastole 6.2 cm; LV posterior wall thickness 0.9 cm; LVEF 45 to 50% 2019: LVIDD 5.1 cm; LV posterior wall thickness 17 mm; LVEF 50 to 55% 2017: LVIDD 5.83 cm; posterior wall thickness 1.39; LVEF 50 to 55%.  EKG:  EKG atrial fibrillation, controlled rate is 72 bpm, and is otherwise normal.  When compared to prior tracings from November 2020, the rate is slower and PVCs are no longer present.  Recent Labs: 03/25/2019: Hemoglobin 14.1; Platelets 152 06/09/2019: ALT 31; BUN 15; Creatinine, Ser 0.90; Potassium 4.2; Sodium 139; TSH 1.22  Recent Lipid Panel    Component Value Date/Time   CHOL 140 06/09/2019 1313   TRIG 144.0 06/09/2019 1313   HDL 31.60 (L) 06/09/2019 1313   CHOLHDL 4 06/09/2019 1313   VLDL 28.8 06/09/2019 1313   LDLCALC 80 06/09/2019 1313   LDLDIRECT 228.0 05/14/2018 0951    Physical Exam:    VS:  BP 114/82   Pulse 72   Ht 6\' 1"  (1.854 m)   Wt 278 lb 6.4 oz (126.3 kg)   SpO2 99%   BMI 36.73 kg/m     Wt Readings from Last 3 Encounters:  11/11/19 278 lb 6.4 oz (126.3 kg)  09/23/19 280 lb (127 kg)  05/13/19 272 lb 12.8 oz (123.7 kg)     GEN: Obesity. No acute distress HEENT: Normal NECK: No JVD. LYMPHATICS: No lymphadenopathy CARDIAC: 2-3/ 6 upper sternal and right subclavicular diamond-shaped systolic murmur.  RRR without murmur, gallop, or edema. VASCULAR:  Normal Pulses. No bruits. RESPIRATORY:  Clear to auscultation without rales, wheezing or rhonchi  ABDOMEN: Soft, non-tender, non-distended, No pulsatile mass, MUSCULOSKELETAL: No deformity  SKIN: Warm and dry NEUROLOGIC:  Alert and oriented x 3 PSYCHIATRIC:  Normal affect   ASSESSMENT:    1. Nonrheumatic aortic valve stenosis   2. Coronary artery disease of native artery of native heart with stable angina pectoris ([place])   3. Essential hypertension   4. Chronic anticoagulation   5. Hyperlipidemia, unspecified hyperlipidemia type   6. Persistent atrial  fibrillation ([place])   7. Obstructive sleep apnea syndrome   8. Educated about COVID-19 virus infection    PLAN:    In order of problems listed above:  1. The current concern is that of a hemodynamic stress imposed by what I presume to be bicuspid  aortic valve disease is causing a gradual reduction in LV function raising the question currently of low flow low gradient severe aortic stenosis.  I have recommended that he undergo left and right heart catheterization with coronary angiography.  He will have a same-day transesophageal echocardiogram.  If I am unable to sort out the impact of the valve on LV function he will be referred to the aortic valve clinic to consider valve therapy.  The procedure and risks were discussed in detail.  He should stop anticoagulation 48 hours prior to the day of the procedure. 2. Coronary angiography will be performed to exclude high-grade disease. 3. Blood pressure control is currently quite good. 4. He is on Eliquis which will be stopped 48 hours prior to the procedure. 5. Target LDL less than 70 is being managed with high intensity Lipitor 40 mg/day. 6. The rate is well controlled. 7. He will need to be reevaluated by his sleep physician, Dr. Radford Pax.  I have concerned that perhaps his decrease in LV function and overall fatigue are related to an adequate therapy of obstructive and possibly central sleep apnea (wife comments that he does not breathe for significant periods of time when he is asleep).  He may need BiPAP or some automatic triggering device to make sure he maintains oxygenation when asleep. 8. COVID-19 vaccination has been received.  Covid testing will be done prior to the procedure.   After the procedure he should not return to work until May 25 at which time he will be at full duty without restrictions.  The patient was counseled to undergo left heart catheterization, coronary angiography, and possible percutaneous coronary intervention with stent  implantation. The procedural risks and benefits were discussed in detail. The risks discussed included death, stroke, myocardial infarction, life-threatening bleeding, limb ischemia, kidney injury, allergy, and possible emergency cardiac surgery. The risk of these significant complications were estimated to occur less than 1% of the time. After discussion, the patient has agreed to proceed.    Medication Adjustments/Labs and Tests Ordered: Current medicines are reviewed at length with the patient today.  Concerns regarding medicines are outlined above.  Orders Placed This Encounter  Procedures  . CBC  . Basic metabolic panel  . EKG 12-Lead   No orders of the defined types were placed in this encounter.   Patient Instructions  Medication Instructions:  Your physician recommends that you continue on your current medications as directed. Please refer to the Current Medication list given to you today.  *If you need a refill on your cardiac medications before your next appointment, please call your pharmacy*   Lab Work: BMET and CBC today  If you have labs (blood work) drawn today and your tests are completely normal, you will receive your results only by: [name] MyChart Message (if you have MyChart) OR . A paper copy in the mail If you have any lab test that is abnormal or we need to change your treatment, we will call you to review the results.   Testing/Procedures: Your physician has requested that you have a TEE. During a TEE, sound waves are used to create images of your heart. It provides your doctor with information about the size and shape of your heart and how well your heart's chambers and valves are working. In this test, a transducer is attached to the end of a flexible tube that's guided down your throat and into your esophagus (the tube leading from you mouth to your stomach) to get a  more detailed image of your heart. You are not awake for the procedure. Please see the  instruction sheet given to you today. For further information please visit [email or web address].  Your physician has requested that you have a cardiac catheterization. Cardiac catheterization is used to diagnose and/or treat various heart conditions. Doctors may recommend this procedure for a number of different reasons. The most common reason is to evaluate chest pain. Chest pain can be a symptom of coronary artery disease (CAD), and cardiac catheterization can show whether plaque is narrowing or blocking your heart's arteries. This procedure is also used to evaluate the valves, as well as measure the blood flow and oxygen levels in different parts of your heart. For further information please visit [email or web address]. Please follow instruction sheet, as given.    Follow-Up:  Your physician recommends that you schedule a follow-up appointment as soon as possible with Dr. Radford Pax for your sleep apnea.   At [facility], you and your health needs are our priority.  As part of our continuing mission to provide you with exceptional heart care, we have created designated Provider Care Teams.  These Care Teams include your primary Cardiologist (physician) and Advanced Practice Providers (APPs -  Physician Assistants and Nurse Practitioners) who all work together to provide you with the care you need, when you need it.  We recommend signing up for the patient portal called "MyChart".  Sign up information is provided on this After Visit Summary.  MyChart is used to connect with patients for Virtual Visits (Telemedicine).  Patients are able to view lab/test results, encounter notes, upcoming appointments, etc.  Non-urgent messages can be sent to your provider as well.   To learn more about what you can do with MyChart, go to [email or web address].    Your next appointment:   2-3 week(s) after cath  The format for your next appointment:   In Person  Provider:   You may see Sinclair Grooms, MD or  one of the following Advanced Practice Providers on your designated Care Team:    Truitt Merle, NP  Cecilie Kicks, NP  Kathyrn Drown, NP    Other Instructions  You are scheduled for a TEE on with Dr. Audie Box . Please arrive at the [facility] (Main Entrance A) at [facility]: [place], [place] [address] at 9 am.    DIET: Nothing to eat or drink after midnight except a sip of water with medications (see medication instructions below)   Medication Instructions:  Hold Metformin and HCTZ the morning of your procedure.  Hold your Eliquis 48 hours prior (last dose will be Sunday night).   Labs: Your labs will be drawn today.   You must have a responsible person to drive you home and stay in the waiting area during your procedure. Failure to do so could result in cancellation.   Bring your insurance cards.   *Special Note: Every effort is made to have your procedure done on time. Occasionally there are emergencies that occur at the hospital that may cause delays. Please be patient if a delay does occur.       [place] OFFICE [place], [place] [address] Dept: [phone] Loc: [place]  11/11/2019  You are scheduled for a Cardiac Catheterization on Wednesday, May 12 with Dr. Daneen Schick.  1. Please arrive at the [facility] (Main Entrance A) at [facility]: [address] [place], [place] [address] at 9:00 AM (This time is two hours before your procedure to ensure your preparation). Free valet parking service is available.   Special note: Every effort is made to have your procedure done on time. Please understand that emergencies sometimes delay scheduled procedures.  2. Diet: Do not eat solid foods after midnight.  The patient may have clear liquids until 5am upon the day of the procedure.  3. Labs: You will have labs drawn  today.  4. Medication instructions in preparation for your procedure:   Contrast Allergy: No   Stop taking Eliquis (Apixiban) on Monday, May 10.  Do not take Diabetes Med Glucophage (Metformin) on the day of the procedure and HOLD 48 HOURS AFTER THE PROCEDURE.  On the morning of your procedure, take your Aspirin and any morning medicines NOT listed above.  You may use sips of water.  5. Plan for one night stay--bring personal belongings. 6. Bring a current list of your medications and current insurance cards. 7. You MUST have a responsible person to drive you home. 8. Someone MUST be with you the first 24 hours after you arrive home or your discharge will be delayed. 9. Please wear clothes that are easy to get on and off and wear slip-on shoes.  Thank you for allowing [place] to care for you!   -- [facility]     Signed, Sinclair Grooms, MD  11/11/2019 11:38 AM    [place]

## 2019-11-09 NOTE — H&P (View-Only) (Signed)
Cardiology Office Note:    Date:  11/11/2019   ID:  [name], DOB 12-May-1966, MRN [id number]  PCP:  Einar Pheasant, MD  Cardiologist:  Sinclair Grooms, MD   Referring MD: Einar Pheasant, MD   Chief Complaint  Patient presents with  . Congestive Heart Failure  . Advice Only    Obstructive sleep apnea/rule out central    History of Present Illness:    Dalton [name] is a 54 y.o. male with a hx of  biscupid aortic valve with AS, prior SVT s/p RF ablation, HTN, HLD who presents for follow-up. Last echo 06/2018 EF 50 to 55%:, grade 1 DD, severely calcifiedbicuspidAV leaflets, moderate aortic stenosis withmoderateAI, mildly dilated ascending aorta. Recent echo shows increasing LV cavity size.  Konstandinos is doing relatively well and is certainly stable since our last conversation in March 2021.  The most recent 2D Doppler echocardiogram which was performed after the office visit demonstrates increase in LV volume in the setting of aortic valvular disease.  Please see be conclusions from the echo report below.  Because of these results I am concerned about progressive hemodynamic stress on the left ventricle and am advising he undergo left, right heart cath, and coronary angiography as a prelude to consideration of valve therapy.  Simultaneously, over the past year or 2 the patient has become increasingly fatigued, has increased daytime sleepiness, and is beginning to have concerned about the safety of his driving related to alertness.  He does have sleep apnea.  His wife points out that he occasionally appears not to breathe despite wearing CPAP.  He awakens feeling tired.  Past Medical History:  Diagnosis Date  . Aortic valve stenosis    a. Bicuspid AV - 2D Echo 06/2014 - mod AS, mild AI, mildly dilated aortic root.  [name] CAD (coronary artery disease)   . Carotid artery disease ([place])    a. Mild by duplex 05/2015 - 1-39% BICA. Repeat due 05/2017.  . Dilated aortic root ([place])     a. By echo 06/2014.  . Environmental allergies   . Hypercholesterolemia   . Hypertension   . Obesity (BMI 30-39.9) 04/26/2018  . Sleep apnea   . SVT (supraventricular tachycardia) ([place])    a. h/o SVT - Ablation done 2013.    Past Surgical History:  Procedure Laterality Date  . CARDIAC ELECTROPHYSIOLOGY STUDY AND ABLATION  08/21/2011  . COLONOSCOPY WITH PROPOFOL N/A 06/26/2017   Procedure: COLONOSCOPY WITH PROPOFOL;  Surgeon: Manya Silvas, MD;  Location[facility] ENDOSCOPY;  Service: Endoscopy;  Laterality: N/A;  . ESOPHAGOGASTRODUODENOSCOPY (EGD) WITH PROPOFOL N/A 06/26/2017   Procedure: ESOPHAGOGASTRODUODENOSCOPY (EGD) WITH PROPOFOL;  Surgeon: Manya Silvas, MD;  Location: [facility] ENDOSCOPY;  Service: Endoscopy;  Laterality: N/A;  . SEPTOPLASTY N/A 03/22/2015   Procedure: SEPTOPLASTY  WITH RIGHT INFERIOR TURBINATE REDUCTION;  Surgeon: Margaretha Sheffield, MD;  Location: [facility];  Service: ENT;  Laterality: N/A;  . SUPRAVENTRICULAR TACHYCARDIA ABLATION N/A 08/21/2011   Procedure: SUPRAVENTRICULAR TACHYCARDIA ABLATION;  Surgeon: Evans Lance, MD;  Location: [facility] CATH LAB;  Service: Cardiovascular;  Laterality: N/A;  . surgery for non descending testicle    . TONSILLECTOMY      Current Medications: Current Meds  Medication Sig  . acetaminophen (TYLENOL) 500 MG tablet Take 500-1,000 mg by mouth every 6 (six) hours as needed (for pain.).  [name] apixaban (ELIQUIS) 5 MG TABS tablet Take 1 tablet (5 mg total) by mouth 2 (two) times daily.  [name] atorvastatin (  LIPITOR) 40 MG tablet Take 1 tablet (40 mg total) by mouth daily.  . cetirizine (ZYRTEC) 10 MG tablet Take 10 mg by mouth 2 (two) times daily.   . fluticasone (FLONASE) 50 MCG/ACT nasal spray Place 2 sprays into both nostrils daily as needed for allergies or rhinitis.  [name] glucose blood (ONETOUCH VERIO) test strip USE TWICE DAILY AS DIRECTED  . hydrochlorothiazide (MICROZIDE) 12.5 MG capsule Take 1 capsule (12.5 mg total) by mouth  daily.  . Lancets (ONETOUCH DELICA PLUS 123XX123) MISC USE TWICE DAILY AS DIRECTED  . lisinopril (ZESTRIL) 10 MG tablet TAKE 1 TABLET BY MOUTH EVERY DAY (Patient taking differently: Take 10 mg by mouth daily. )  . metFORMIN (GLUCOPHAGE) 1000 MG tablet Take 1 tablet (1,000 mg total) by mouth 2 (two) times daily with a meal.  . metoprolol succinate (TOPROL-XL) 25 MG 24 hr tablet Take 1 tablet (25 mg total) by mouth daily.  . NON FORMULARY as directed. CPAP     Allergies:   Patient has no known allergies.   Social History   Socioeconomic History  . Marital status: Married    Spouse name: Not on file  . Number of children: 2  . Years of education: Not on file  . Highest education level: Not on file  Occupational History  . Occupation: truck Education administrator: Insurance account manager  Tobacco Use  . Smoking status: Former Smoker    Types: Cigarettes    Quit date: 11/04/1988    Years since quitting: 31.0  . Smokeless tobacco: Never Used  Substance and Sexual Activity  . Alcohol use: Yes    Alcohol/week: 8.0 standard drinks    Types: 7 Glasses of wine, 1 Shots of liquor per week    Comment: occasionally  . Drug use: Not Currently    Comment: many years ago in highschool  . Sexual activity: Not Currently  Other Topics Concern  . Not on file  Social History Narrative   Very rare exercise   Social Determinants of Health   Financial Resource Strain:   . Difficulty of Paying Living Expenses:   Food Insecurity:   . Worried About Charity fundraiser in the Last Year:   . Arboriculturist in the Last Year:   Transportation Needs:   . Film/video editor (Medical):   [name] Lack of Transportation (Non-Medical):   Physical Activity:   . Days of Exercise per Week:   . Minutes of Exercise per Session:   Stress:   . Feeling of Stress :   Social Connections:   . Frequency of Communication with Friends and Family:   . Frequency of Social Gatherings with Friends and Family:   . Attends  Religious Services:   . Active Member of Clubs or Organizations:   . Attends Archivist Meetings:   [name] Marital Status:      Family History: The patient's family history includes Aortic stenosis in his maternal grandfather; Breast cancer in his maternal grandmother; Cancer in his maternal grandfather and another family member; Diabetes in an other family member.  ROS:   Please see the history of present illness.    Excessive daytime sleepiness, snoring during sleep.  No chest discomfort.  Overall endurance and hospital condition is forcing changes in his job at [employer].  All other systems reviewed and are negative.  EKGs/Labs/Other Studies Reviewed:    The following studies were reviewed today:  2D Doppler Echocardiogram March 2021: IMPRESSIONS  1. Compared to echo from Sept 2020, there is no significant change.  2. Poor acoustic windows limit study Consider Definity to further define.  3. Left ventricular ejection fraction, by estimation, is 40 to 45%. The  left ventricle has mildly decreased function. The left ventricle  demonstrates global hypokinesis. There is mild left ventricular  hypertrophy. Left ventricular diastolic parameters  are indeterminate.  4. Right ventricular systolic function is mildly reduced. The right  ventricular size is normal.  5. The mitral valve is abnormal. Trivial mitral valve regurgitation.  6. AV is difficult to see. Peak and mean gradients through the valve are  27 and 16 mm Hg respectively LVO/AV VTI ratio is 0.27 consistent with  moderate AS. Consider TEE to further define. .. Aortic valve regurgitation  is mild. no significant change from  prevoius echo  7. The inferior vena cava is normal in size with greater than 50%  respiratory variability, suggesting right atrial pressure of 3 mmHg.    Compared to echocardiographic results over the past 4 years: 2021: LV internal diameter diastole 5.6 cm; LV posterior wall thickness 1.3  cm; LVEF 40 to 45%. 2020: LV internal diameter diastole 6.2 cm; LV posterior wall thickness 0.9 cm; LVEF 45 to 50% 2019: LVIDD 5.1 cm; LV posterior wall thickness 17 mm; LVEF 50 to 55% 2017: LVIDD 5.83 cm; posterior wall thickness 1.39; LVEF 50 to 55%.  EKG:  EKG atrial fibrillation, controlled rate is 72 bpm, and is otherwise normal.  When compared to prior tracings from November 2020, the rate is slower and PVCs are no longer present.  Recent Labs: 03/25/2019: Hemoglobin 14.1; Platelets 152 06/09/2019: ALT 31; BUN 15; Creatinine, Ser 0.90; Potassium 4.2; Sodium 139; TSH 1.22  Recent Lipid Panel    Component Value Date/Time   CHOL 140 06/09/2019 1313   TRIG 144.0 06/09/2019 1313   HDL 31.60 (L) 06/09/2019 1313   CHOLHDL 4 06/09/2019 1313   VLDL 28.8 06/09/2019 1313   LDLCALC 80 06/09/2019 1313   LDLDIRECT 228.0 05/14/2018 0951    Physical Exam:    VS:  BP 114/82   Pulse 72   Ht 6\' 1"  (1.854 m)   Wt 278 lb 6.4 oz (126.3 kg)   SpO2 99%   BMI 36.73 kg/m     Wt Readings from Last 3 Encounters:  11/11/19 278 lb 6.4 oz (126.3 kg)  09/23/19 280 lb (127 kg)  05/13/19 272 lb 12.8 oz (123.7 kg)     GEN: Obesity. No acute distress HEENT: Normal NECK: No JVD. LYMPHATICS: No lymphadenopathy CARDIAC: 2-3/ 6 upper sternal and right subclavicular diamond-shaped systolic murmur.  RRR without murmur, gallop, or edema. VASCULAR:  Normal Pulses. No bruits. RESPIRATORY:  Clear to auscultation without rales, wheezing or rhonchi  ABDOMEN: Soft, non-tender, non-distended, No pulsatile mass, MUSCULOSKELETAL: No deformity  SKIN: Warm and dry NEUROLOGIC:  Alert and oriented x 3 PSYCHIATRIC:  Normal affect   ASSESSMENT:    1. Nonrheumatic aortic valve stenosis   2. Coronary artery disease of native artery of native heart with stable angina pectoris ([place])   3. Essential hypertension   4. Chronic anticoagulation   5. Hyperlipidemia, unspecified hyperlipidemia type   6. Persistent atrial  fibrillation ([place])   7. Obstructive sleep apnea syndrome   8. Educated about COVID-19 virus infection    PLAN:    In order of problems listed above:  1. The current concern is that of a hemodynamic stress imposed by what I presume to be bicuspid  aortic valve disease is causing a gradual reduction in LV function raising the question currently of low flow low gradient severe aortic stenosis.  I have recommended that he undergo left and right heart catheterization with coronary angiography.  He will have a same-day transesophageal echocardiogram.  If I am unable to sort out the impact of the valve on LV function he will be referred to the aortic valve clinic to consider valve therapy.  The procedure and risks were discussed in detail.  He should stop anticoagulation 48 hours prior to the day of the procedure. 2. Coronary angiography will be performed to exclude high-grade disease. 3. Blood pressure control is currently quite good. 4. He is on Eliquis which will be stopped 48 hours prior to the procedure. 5. Target LDL less than 70 is being managed with high intensity Lipitor 40 mg/day. 6. The rate is well controlled. 7. He will need to be reevaluated by his sleep physician, Dr. Radford Pax.  I have concerned that perhaps his decrease in LV function and overall fatigue are related to an adequate therapy of obstructive and possibly central sleep apnea (wife comments that he does not breathe for significant periods of time when he is asleep).  He may need BiPAP or some automatic triggering device to make sure he maintains oxygenation when asleep. 8. COVID-19 vaccination has been received.  Covid testing will be done prior to the procedure.   After the procedure he should not return to work until May 25 at which time he will be at full duty without restrictions.  The patient was counseled to undergo left heart catheterization, coronary angiography, and possible percutaneous coronary intervention with stent  implantation. The procedural risks and benefits were discussed in detail. The risks discussed included death, stroke, myocardial infarction, life-threatening bleeding, limb ischemia, kidney injury, allergy, and possible emergency cardiac surgery. The risk of these significant complications were estimated to occur less than 1% of the time. After discussion, the patient has agreed to proceed.    Medication Adjustments/Labs and Tests Ordered: Current medicines are reviewed at length with the patient today.  Concerns regarding medicines are outlined above.  Orders Placed This Encounter  Procedures  . CBC  . Basic metabolic panel  . EKG 12-Lead   No orders of the defined types were placed in this encounter.   Patient Instructions  Medication Instructions:  Your physician recommends that you continue on your current medications as directed. Please refer to the Current Medication list given to you today.  *If you need a refill on your cardiac medications before your next appointment, please call your pharmacy*   Lab Work: BMET and CBC today  If you have labs (blood work) drawn today and your tests are completely normal, you will receive your results only by: [name] MyChart Message (if you have MyChart) OR . A paper copy in the mail If you have any lab test that is abnormal or we need to change your treatment, we will call you to review the results.   Testing/Procedures: Your physician has requested that you have a TEE. During a TEE, sound waves are used to create images of your heart. It provides your doctor with information about the size and shape of your heart and how well your heart's chambers and valves are working. In this test, a transducer is attached to the end of a flexible tube that's guided down your throat and into your esophagus (the tube leading from you mouth to your stomach) to get a  more detailed image of your heart. You are not awake for the procedure. Please see the  instruction sheet given to you today. For further information please visit [email or web address].  Your physician has requested that you have a cardiac catheterization. Cardiac catheterization is used to diagnose and/or treat various heart conditions. Doctors may recommend this procedure for a number of different reasons. The most common reason is to evaluate chest pain. Chest pain can be a symptom of coronary artery disease (CAD), and cardiac catheterization can show whether plaque is narrowing or blocking your heart's arteries. This procedure is also used to evaluate the valves, as well as measure the blood flow and oxygen levels in different parts of your heart. For further information please visit [email or web address]. Please follow instruction sheet, as given.    Follow-Up:  Your physician recommends that you schedule a follow-up appointment as soon as possible with Dr. Radford Pax for your sleep apnea.   At [facility], you and your health needs are our priority.  As part of our continuing mission to provide you with exceptional heart care, we have created designated Provider Care Teams.  These Care Teams include your primary Cardiologist (physician) and Advanced Practice Providers (APPs -  Physician Assistants and Nurse Practitioners) who all work together to provide you with the care you need, when you need it.  We recommend signing up for the patient portal called "MyChart".  Sign up information is provided on this After Visit Summary.  MyChart is used to connect with patients for Virtual Visits (Telemedicine).  Patients are able to view lab/test results, encounter notes, upcoming appointments, etc.  Non-urgent messages can be sent to your provider as well.   To learn more about what you can do with MyChart, go to [email or web address].    Your next appointment:   2-3 week(s) after cath  The format for your next appointment:   In Person  Provider:   You may see Sinclair Grooms, MD or  one of the following Advanced Practice Providers on your designated Care Team:    Truitt Merle, NP  Cecilie Kicks, NP  Kathyrn Drown, NP    Other Instructions  You are scheduled for a TEE on with Dr. Audie Box . Please arrive at the [facility] (Main Entrance A) at [facility]: [place], [place] [address] at 9 am.    DIET: Nothing to eat or drink after midnight except a sip of water with medications (see medication instructions below)   Medication Instructions:  Hold Metformin and HCTZ the morning of your procedure.  Hold your Eliquis 48 hours prior (last dose will be Sunday night).   Labs: Your labs will be drawn today.   You must have a responsible person to drive you home and stay in the waiting area during your procedure. Failure to do so could result in cancellation.   Bring your insurance cards.   *Special Note: Every effort is made to have your procedure done on time. Occasionally there are emergencies that occur at the hospital that may cause delays. Please be patient if a delay does occur.       [place] OFFICE [place], [place] [address] Dept: [phone] Loc: [place]  11/11/2019  You are scheduled for a Cardiac Catheterization on Wednesday, May 12 with Dr. Daneen Schick.  1. Please arrive at the [facility] (Main Entrance A) at [facility]  [facility]: [address] [place], [place] [address] at 9:00 AM (This time is two hours before your procedure to ensure your preparation). Free valet parking service is available.   Special note: Every effort is made to have your procedure done on time. Please understand that emergencies sometimes delay scheduled procedures.  2. Diet: Do not eat solid foods after midnight.  The patient may have clear liquids until 5am upon the day of the procedure.  3. Labs: You will have labs drawn  today.  4. Medication instructions in preparation for your procedure:   Contrast Allergy: No   Stop taking Eliquis (Apixiban) on Monday, May 10.  Do not take Diabetes Med Glucophage (Metformin) on the day of the procedure and HOLD 48 HOURS AFTER THE PROCEDURE.  On the morning of your procedure, take your Aspirin and any morning medicines NOT listed above.  You may use sips of water.  5. Plan for one night stay--bring personal belongings. 6. Bring a current list of your medications and current insurance cards. 7. You MUST have a responsible person to drive you home. 8. Someone MUST be with you the first 24 hours after you arrive home or your discharge will be delayed. 9. Please wear clothes that are easy to get on and off and wear slip-on shoes.  Thank you for allowing [place] to care for you!   -- [place] [facility]     Signed, Sinclair Grooms, MD  11/11/2019 11:38 AM    [place]

## 2019-11-11 ENCOUNTER — Other Ambulatory Visit: Payer: Self-pay

## 2019-11-11 ENCOUNTER — Encounter: Payer: Self-pay | Admitting: Interventional Cardiology

## 2019-11-11 ENCOUNTER — Ambulatory Visit (INDEPENDENT_AMBULATORY_CARE_PROVIDER_SITE_OTHER): Payer: Managed Care, Other (non HMO) | Admitting: Interventional Cardiology

## 2019-11-11 VITALS — BP 114/82 | HR 72 | Ht 73.0 in | Wt 278.4 lb

## 2019-11-11 DIAGNOSIS — I25118 Atherosclerotic heart disease of native coronary artery with other forms of angina pectoris: Secondary | ICD-10-CM | POA: Diagnosis not present

## 2019-11-11 DIAGNOSIS — Z7901 Long term (current) use of anticoagulants: Secondary | ICD-10-CM

## 2019-11-11 DIAGNOSIS — I350 Nonrheumatic aortic (valve) stenosis: Secondary | ICD-10-CM | POA: Diagnosis not present

## 2019-11-11 DIAGNOSIS — I10 Essential (primary) hypertension: Secondary | ICD-10-CM

## 2019-11-11 DIAGNOSIS — E785 Hyperlipidemia, unspecified: Secondary | ICD-10-CM

## 2019-11-11 DIAGNOSIS — I4819 Other persistent atrial fibrillation: Secondary | ICD-10-CM

## 2019-11-11 DIAGNOSIS — Z7189 Other specified counseling: Secondary | ICD-10-CM

## 2019-11-11 DIAGNOSIS — G4733 Obstructive sleep apnea (adult) (pediatric): Secondary | ICD-10-CM

## 2019-11-11 LAB — BASIC METABOLIC PANEL
BUN/Creatinine Ratio: 14 (ref 9–20)
BUN: 14 mg/dL (ref 6–24)
CO2: 29 mmol/L (ref 20–29)
Calcium: 9.4 mg/dL (ref 8.7–10.2)
Chloride: 104 mmol/L (ref 96–106)
Creatinine, Ser: 1.02 mg/dL (ref 0.76–1.27)
GFR calc Af Amer: 96 mL/min/{1.73_m2} (ref >59)
GFR calc non Af Amer: 83 mL/min/{1.73_m2} (ref >59)
Glucose: 127 mg/dL — ABNORMAL HIGH (ref 65–99)
Potassium: 4.4 mmol/L (ref 3.5–5.2)
Sodium: 139 mmol/L (ref 134–144)

## 2019-11-11 LAB — CBC
Hematocrit: 43.8 % (ref 37.5–51.0)
Hemoglobin: 14.8 g/dL (ref 13.0–17.7)
MCH: 31.8 pg (ref 26.6–33.0)
MCHC: 33.8 g/dL (ref 31.5–35.7)
MCV: 94 fL (ref 79–97)
Platelets: 197 10*3/uL (ref 150–450)
RBC: 4.66 x10E6/uL (ref 4.14–5.80)
RDW: 14 % (ref 11.6–15.4)
WBC: 7.5 10*3/uL (ref 3.4–10.8)

## 2019-11-11 NOTE — Patient Instructions (Addendum)
Medication Instructions:  Your physician recommends that you continue on your current medications as directed. Please refer to the Current Medication list given to you today.  *If you need a refill on your cardiac medications before your next appointment, please call your pharmacy*   Lab Work: BMET and CBC today  If you have labs (blood work) drawn today and your tests are completely normal, you will receive your results only by: [name] MyChart Message (if you have MyChart) OR . A paper copy in the mail If you have any lab test that is abnormal or we need to change your treatment, we will call you to review the results.   Testing/Procedures: Your physician has requested that you have a TEE. During a TEE, sound waves are used to create images of your heart. It provides your doctor with information about the size and shape of your heart and how well your heart's chambers and valves are working. In this test, a transducer is attached to the end of a flexible tube that's guided down your throat and into your esophagus (the tube leading from you mouth to your stomach) to get a more detailed image of your heart. You are not awake for the procedure. Please see the instruction sheet given to you today. For further information please visit [email or web address].  Your physician has requested that you have a cardiac catheterization. Cardiac catheterization is used to diagnose and/or treat various heart conditions. Doctors may recommend this procedure for a number of different reasons. The most common reason is to evaluate chest pain. Chest pain can be a symptom of coronary artery disease (CAD), and cardiac catheterization can show whether plaque is narrowing or blocking your heart's arteries. This procedure is also used to evaluate the valves, as well as measure the blood flow and oxygen levels in different parts of your heart. For further information please visit [email or web address]. Please follow instruction  sheet, as given.    Follow-Up:  Your physician recommends that you schedule a follow-up appointment as soon as possible with Dr. Radford Pax for your sleep apnea.   At [facility], you and your health needs are our priority.  As part of our continuing mission to provide you with exceptional heart care, we have created designated Provider Care Teams.  These Care Teams include your primary Cardiologist (physician) and Advanced Practice Providers (APPs -  Physician Assistants and Nurse Practitioners) who all work together to provide you with the care you need, when you need it.  We recommend signing up for the patient portal called "MyChart".  Sign up information is provided on this After Visit Summary.  MyChart is used to connect with patients for Virtual Visits (Telemedicine).  Patients are able to view lab/test results, encounter notes, upcoming appointments, etc.  Non-urgent messages can be sent to your provider as well.   To learn more about what you can do with MyChart, go to [email or web address].    Your next appointment:   2-3 week(s) after cath  The format for your next appointment:   In Person  Provider:   You may see Sinclair Grooms, MD or one of the following Advanced Practice Providers on your designated Care Team:    Truitt Merle, NP  Cecilie Kicks, NP  Kathyrn Drown, NP    Other Instructions  You are scheduled for a TEE on with Dr. Audie Box . Please arrive at the [facility] (Main Entrance A) at [facility]: [address] [place], [place] [address] at 49  am.    DIET: Nothing to eat or drink after midnight except a sip of water with medications (see medication instructions below)   Medication Instructions:  Hold Metformin and HCTZ the morning of your procedure.  Hold your Eliquis 48 hours prior (last dose will be Sunday night).   Labs: Your labs will be drawn today.   You must have a responsible person to drive you home and stay in the waiting area  during your procedure. Failure to do so could result in cancellation.   Bring your insurance cards.   *Special Note: Every effort is made to have your procedure done on time. Occasionally there are emergencies that occur at the hospital that may cause delays. Please be patient if a delay does occur.       [facility] [place], [place] [address] Dept: [phone] Loc: [place]  11/11/2019  You are scheduled for a Cardiac Catheterization on Wednesday, May 12 with Dr. Daneen Schick.  1. Please arrive at the [facility] (Main Entrance A) at [facility]: [address] [place], [place] [address] at 9:00 AM (This time is two hours before your procedure to ensure your preparation). Free valet parking service is available.   Special note: Every effort is made to have your procedure done on time. Please understand that emergencies sometimes delay scheduled procedures.  2. Diet: Do not eat solid foods after midnight.  The patient may have clear liquids until 5am upon the day of the procedure.  3. Labs: You will have labs drawn today.  4. Medication instructions in preparation for your procedure:   Contrast Allergy: No   Stop taking Eliquis (Apixiban) on Monday, May 10.  Do not take Diabetes Med Glucophage (Metformin) on the day of the procedure and HOLD 48 HOURS AFTER THE PROCEDURE.  On the morning of your procedure, take your Aspirin and any morning medicines NOT listed above.  You may use sips of water.  5. Plan for one night stay--bring personal belongings. 6. Bring a current list of your medications and current insurance cards. 7. You MUST have a responsible person to drive you home. 8. Someone MUST be with you the first 24 hours after you arrive home or your discharge will be delayed. 9. Please wear clothes that are easy to get on and off and wear  slip-on shoes.  Thank you for allowing [place] to care for you!   -- [facility]

## 2019-11-14 ENCOUNTER — Other Ambulatory Visit (HOSPITAL_COMMUNITY)
Admission: RE | Admit: 2019-11-14 | Discharge: 2019-11-14 | Disposition: A | Discharge status: Home / Self Care | Payer: Managed Care, Other (non HMO) | Source: Ambulatory Visit | Attending: Cardiovascular Disease | Admitting: Cardiovascular Disease

## 2019-11-14 DIAGNOSIS — Z01812 Encounter for preprocedural laboratory examination: Secondary | ICD-10-CM | POA: Insufficient documentation

## 2019-11-14 DIAGNOSIS — Z20822 Contact with and (suspected) exposure to covid-19: Secondary | ICD-10-CM | POA: Diagnosis not present

## 2019-11-14 LAB — SARS CORONAVIRUS 2 (TAT 6-24 HRS): SARS Coronavirus 2: NEGATIVE

## 2019-11-15 ENCOUNTER — Telehealth: Payer: Self-pay | Admitting: *Deleted

## 2019-11-15 NOTE — Anesthesia Preprocedure Evaluation (Addendum)
Anesthesia Evaluation  Patient identified by MRN, date of birth, ID band Patient awake    Reviewed: Allergy & Precautions, NPO status , Patient's Chart, lab work & pertinent test results  Airway Mallampati: II  TM Distance: >3 FB Neck ROM: Full    Dental no notable dental hx. (+) Teeth Intact, Dental Advisory Given   Pulmonary sleep apnea , former smoker,    Pulmonary exam normal breath sounds clear to auscultation       Cardiovascular hypertension, Pt. on medications and Pt. on home beta blockers + CAD and +CHF  Normal cardiovascular exam+ dysrhythmias Supra Ventricular Tachycardia + Valvular Problems/Murmurs AS  Rhythm:Regular Rate:Normal  09/23/19 Echo Left Ventricle: Left ventricular ejection fraction, by estimation, is 40  to 45%. The left ventricle has mildly decreased function. The left  ventricle demonstrates global hypokinesis. Definity contrast agent was  given IV to delineate the left ventricular  endocardial borders. The left ventricular internal cavity size was small.  There is mild left ventricular hypertrophy. Left ventricular diastolic  parameters are indeterminate.   Right Ventricle: The right ventricular size is normal. No increase in  right ventricular wall thickness. Right ventricular systolic function is  mildly reduced.   Left Atrium: Left atrial size was normal in size.   Right Atrium: Right atrial size was normal in size.   Pericardium: There is no evidence of pericardial effusion.   Mitral Valve: The mitral valve is abnormal. There is mild thickening of  the mitral valve leaflet(s). Trivial mitral valve regurgitation.   Tricuspid Valve: The tricuspid valve is normal in structure. Tricuspid  valve regurgitation is trivial.   Aortic Valve: AV is difficult to see. Peak and mean gradients through the  valve are 27 and 16 mm Hg respectively LVO/AV VTI ratio is 0.27 consistent  with moderate AS.  Consider TEE to further define. The aortic valve was not  well visualized   Neuro/Psych negative neurological ROS  negative psych ROS   GI/Hepatic Neg liver ROS,   Endo/Other  diabetes, Type 2  Renal/GU negative Renal ROS     Musculoskeletal negative musculoskeletal ROS (+)   Abdominal (+) + obese,   Peds  Hematology negative hematology ROS (+)   Anesthesia Other Findings   Reproductive/Obstetrics negative OB ROS                            Anesthesia Physical Anesthesia Plan  ASA: III  Anesthesia Plan: MAC   Post-op Pain Management:    Induction: Intravenous  PONV Risk Score and Plan: Treatment may vary due to age or medical condition  Airway Management Planned: Nasal Cannula and Natural Airway  Additional Equipment: None  Intra-op Plan:   Post-operative Plan:   Informed Consent: I have reviewed the patients History and Physical, chart, labs and discussed the procedure including the risks, benefits and alternatives for the proposed anesthesia with the patient or authorized representative who has indicated his/her understanding and acceptance.     Dental advisory given  Plan Discussed with: CRNA  Anesthesia Plan Comments:        Anesthesia Quick Evaluation

## 2019-11-15 NOTE — Telephone Encounter (Signed)
Pt contacted pre-catheterization scheduled at [facility] for:  Wednesday Nov 16, 2019 11:30 AM Verified arrival time and place: [facility]) at: 9 AM/TEE 10 AM   Nothing to eat or drink after midnight prior to procedures.  Hold: Eliquis-none 11/14/19 until post procedure. HCTZ-AM of procedure Metformin-day of procedure and 48 hours post procedure.  Except hold medications AM meds can be  taken pre-cath with sips of water including: ASA 81 mg   Confirmed patient has responsible adult to drive home post procedure and observe 24 hours after arriving home: yes  You are allowed ONE visitor in the waiting room during your procedures. Both you and your visitor must wear masks.     COVID-19 Pre-Screening Questions:  . In the past 7 to 10 days have you had a cough,  shortness of breath, headache, congestion, fever (100 or greater) body aches, chills, sore throat, or sudden loss of taste or sense of smell? no . Have you been around anyone with known Covid 19 in the past 7 to 10 days? no . Have you been around anyone who is awaiting Covid 19 test results in the past 7 to 10 days? no . Have you been around anyone who  has mentioned symptoms of Covid 19 within the past 7 to 10 days? no   Reviewed procedure/mask/visitor instructions, COVID-19 screening questions with patient.

## 2019-11-16 ENCOUNTER — Ambulatory Visit (HOSPITAL_BASED_OUTPATIENT_CLINIC_OR_DEPARTMENT_OTHER): Payer: Managed Care, Other (non HMO)

## 2019-11-16 ENCOUNTER — Other Ambulatory Visit: Payer: Self-pay

## 2019-11-16 ENCOUNTER — Ambulatory Visit (HOSPITAL_COMMUNITY): Payer: Managed Care, Other (non HMO) | Admitting: Anesthesiology

## 2019-11-16 ENCOUNTER — Encounter (HOSPITAL_COMMUNITY)
Admission: RE | Disposition: A | Discharge status: Home / Self Care | Payer: Self-pay | Source: Home / Self Care | Attending: Cardiovascular Disease

## 2019-11-16 ENCOUNTER — Ambulatory Visit (HOSPITAL_COMMUNITY)
Admission: RE | Admit: 2019-11-16 | Discharge: 2019-11-16 | Disposition: A | Discharge status: Home / Self Care | Payer: Managed Care, Other (non HMO) | Attending: Cardiovascular Disease | Admitting: Cardiovascular Disease

## 2019-11-16 ENCOUNTER — Encounter (HOSPITAL_COMMUNITY): Payer: Self-pay | Admitting: Cardiovascular Disease

## 2019-11-16 ENCOUNTER — Other Ambulatory Visit: Payer: Self-pay | Admitting: *Deleted

## 2019-11-16 ENCOUNTER — Encounter (HOSPITAL_COMMUNITY)
Admission: RE | Disposition: A | Discharge status: Home / Self Care | Payer: Managed Care, Other (non HMO) | Source: Home / Self Care | Attending: Cardiovascular Disease

## 2019-11-16 DIAGNOSIS — I10 Essential (primary) hypertension: Secondary | ICD-10-CM | POA: Diagnosis present

## 2019-11-16 DIAGNOSIS — I4819 Other persistent atrial fibrillation: Secondary | ICD-10-CM | POA: Diagnosis not present

## 2019-11-16 DIAGNOSIS — I272 Pulmonary hypertension, unspecified: Secondary | ICD-10-CM | POA: Insufficient documentation

## 2019-11-16 DIAGNOSIS — I110 Hypertensive heart disease with heart failure: Secondary | ICD-10-CM | POA: Insufficient documentation

## 2019-11-16 DIAGNOSIS — Z79899 Other long term (current) drug therapy: Secondary | ICD-10-CM | POA: Diagnosis not present

## 2019-11-16 DIAGNOSIS — G4733 Obstructive sleep apnea (adult) (pediatric): Secondary | ICD-10-CM | POA: Diagnosis not present

## 2019-11-16 DIAGNOSIS — I352 Nonrheumatic aortic (valve) stenosis with insufficiency: Secondary | ICD-10-CM | POA: Diagnosis not present

## 2019-11-16 DIAGNOSIS — E7800 Pure hypercholesterolemia, unspecified: Secondary | ICD-10-CM | POA: Diagnosis not present

## 2019-11-16 DIAGNOSIS — I6523 Occlusion and stenosis of bilateral carotid arteries: Secondary | ICD-10-CM | POA: Diagnosis not present

## 2019-11-16 DIAGNOSIS — Z6835 Body mass index (BMI) 35.0-35.9, adult: Secondary | ICD-10-CM | POA: Diagnosis not present

## 2019-11-16 DIAGNOSIS — E669 Obesity, unspecified: Secondary | ICD-10-CM | POA: Diagnosis not present

## 2019-11-16 DIAGNOSIS — Z87891 Personal history of nicotine dependence: Secondary | ICD-10-CM | POA: Diagnosis not present

## 2019-11-16 DIAGNOSIS — I5042 Chronic combined systolic (congestive) and diastolic (congestive) heart failure: Secondary | ICD-10-CM | POA: Diagnosis not present

## 2019-11-16 DIAGNOSIS — E1165 Type 2 diabetes mellitus with hyperglycemia: Secondary | ICD-10-CM | POA: Diagnosis present

## 2019-11-16 DIAGNOSIS — I25118 Atherosclerotic heart disease of native coronary artery with other forms of angina pectoris: Secondary | ICD-10-CM

## 2019-11-16 DIAGNOSIS — Z7901 Long term (current) use of anticoagulants: Secondary | ICD-10-CM | POA: Diagnosis not present

## 2019-11-16 DIAGNOSIS — G4730 Sleep apnea, unspecified: Secondary | ICD-10-CM | POA: Diagnosis present

## 2019-11-16 DIAGNOSIS — E785 Hyperlipidemia, unspecified: Secondary | ICD-10-CM | POA: Diagnosis not present

## 2019-11-16 DIAGNOSIS — I350 Nonrheumatic aortic (valve) stenosis: Secondary | ICD-10-CM | POA: Diagnosis not present

## 2019-11-16 DIAGNOSIS — Z7984 Long term (current) use of oral hypoglycemic drugs: Secondary | ICD-10-CM | POA: Diagnosis not present

## 2019-11-16 HISTORY — PX: TEE WITHOUT CARDIOVERSION: SHX5443

## 2019-11-16 HISTORY — PX: RIGHT/LEFT HEART CATH AND CORONARY ANGIOGRAPHY: CATH118266

## 2019-11-16 LAB — POCT I-STAT EG7
Acid-Base Excess: 1 mmol/L (ref 0.0–2.0)
Bicarbonate: 27.7 mmol/L (ref 20.0–28.0)
Calcium, Ion: 1.27 mmol/L (ref 1.15–1.40)
HCT: 44 % (ref 39.0–52.0)
Hemoglobin: 15 g/dL (ref 13.0–17.0)
O2 Saturation: 68 %
Potassium: 4.2 mmol/L (ref 3.5–5.1)
Sodium: 141 mmol/L (ref 135–145)
TCO2: 29 mmol/L (ref 22–32)
pCO2, Ven: 53.1 mmHg (ref 44.0–60.0)
pH, Ven: 7.326 (ref 7.250–7.430)
pO2, Ven: 39 mmHg (ref 32.0–45.0)

## 2019-11-16 LAB — POCT I-STAT 7, (LYTES, BLD GAS, ICA,H+H)
Acid-Base Excess: 1 mmol/L (ref 0.0–2.0)
Bicarbonate: 26.4 mmol/L (ref 20.0–28.0)
Calcium, Ion: 1.27 mmol/L (ref 1.15–1.40)
HCT: 43 % (ref 39.0–52.0)
Hemoglobin: 14.6 g/dL (ref 13.0–17.0)
O2 Saturation: 100 %
Potassium: 4.3 mmol/L (ref 3.5–5.1)
Sodium: 139 mmol/L (ref 135–145)
TCO2: 28 mmol/L (ref 22–32)
pCO2 arterial: 46.2 mmHg (ref 32.0–48.0)
pH, Arterial: 7.365 (ref 7.350–7.450)
pO2, Arterial: 202 mmHg — ABNORMAL HIGH (ref 83.0–108.0)

## 2019-11-16 LAB — GLUCOSE, CAPILLARY
Glucose-Capillary: 150 mg/dL — ABNORMAL HIGH (ref 70–99)
Glucose-Capillary: 118 mg/dL — ABNORMAL HIGH (ref 70–99)

## 2019-11-16 SURGERY — RIGHT/LEFT HEART CATH AND CORONARY ANGIOGRAPHY
Anesthesia: LOCAL

## 2019-11-16 SURGERY — ECHOCARDIOGRAM, TRANSESOPHAGEAL
Anesthesia: Monitor Anesthesia Care

## 2019-11-16 MED ORDER — ATORVASTATIN CALCIUM 40 MG PO TABS: 40.0000 mg | ORAL_TABLET | Freq: Every day | ORAL | Status: DC

## 2019-11-16 MED ORDER — LIDOCAINE HCL (PF) 1 % IJ SOLN
INTRAMUSCULAR | Status: DC | PRN
  Administered 2019-11-16 (×2): 2 mL

## 2019-11-16 MED ORDER — SODIUM CHLORIDE 0.9% FLUSH: 3.0000 mL | INTRAVENOUS | Status: DC | PRN

## 2019-11-16 MED ORDER — HYDRALAZINE HCL 20 MG/ML IJ SOLN: 10.0000 mg | INTRAMUSCULAR | Status: DC | PRN

## 2019-11-16 MED ORDER — METOPROLOL SUCCINATE ER 50 MG PO TB24: 50.0000 mg | ORAL_TABLET | Freq: Every day | ORAL | 11 refills | Status: DC

## 2019-11-16 MED ORDER — METFORMIN HCL 500 MG PO TABS: 1000.0000 mg | ORAL_TABLET | Freq: Two times a day (BID) | ORAL | Status: DC

## 2019-11-16 MED ORDER — PROPOFOL 500 MG/50ML IV EMUL
INTRAVENOUS | Status: DC | PRN
  Administered 2019-11-16: 100 ug/kg/min via INTRAVENOUS

## 2019-11-16 MED ORDER — HEPARIN SODIUM (PORCINE) 1000 UNIT/ML IJ SOLN
INTRAMUSCULAR | Status: AC
  Filled 2019-11-16: qty 1

## 2019-11-16 MED ORDER — ACETAMINOPHEN 500 MG PO TABS: 500.0000 mg | ORAL_TABLET | Freq: Four times a day (QID) | ORAL | Status: DC | PRN

## 2019-11-16 MED ORDER — VERAPAMIL HCL 2.5 MG/ML IV SOLN
INTRAVENOUS | Status: AC
  Filled 2019-11-16: qty 2

## 2019-11-16 MED ORDER — OXYCODONE HCL 5 MG PO TABS: 5.0000 mg | ORAL_TABLET | ORAL | Status: DC | PRN

## 2019-11-16 MED ORDER — LISINOPRIL 20 MG PO TABS: 20.0000 mg | ORAL_TABLET | Freq: Every day | ORAL | 11 refills | Status: DC

## 2019-11-16 MED ORDER — APIXABAN 5 MG PO TABS: 5.0000 mg | ORAL_TABLET | Freq: Two times a day (BID) | ORAL | Status: DC

## 2019-11-16 MED ORDER — LORATADINE 10 MG PO TABS: 10.0000 mg | ORAL_TABLET | Freq: Every day | ORAL | Status: DC

## 2019-11-16 MED ORDER — SODIUM CHLORIDE 0.9 % WEIGHT BASED INFUSION: 1.0000 mL/kg/h | INTRAVENOUS | Status: DC

## 2019-11-16 MED ORDER — HEPARIN (PORCINE) IN NACL 1000-0.9 UT/500ML-% IV SOLN
INTRAVENOUS | Status: DC | PRN
  Administered 2019-11-16 (×2): 500 mL

## 2019-11-16 MED ORDER — METOPROLOL SUCCINATE ER 25 MG PO TB24: 25.0000 mg | ORAL_TABLET | Freq: Every day | ORAL | Status: DC

## 2019-11-16 MED ORDER — MIDAZOLAM HCL 2 MG/2ML IJ SOLN
INTRAMUSCULAR | Status: DC | PRN
  Administered 2019-11-16: 2 mg via INTRAVENOUS

## 2019-11-16 MED ORDER — LABETALOL HCL 5 MG/ML IV SOLN: 10.0000 mg | INTRAVENOUS | Status: DC | PRN

## 2019-11-16 MED ORDER — VERAPAMIL HCL 2.5 MG/ML IV SOLN
INTRAVENOUS | Status: DC | PRN
  Administered 2019-11-16: 10 mL via INTRA_ARTERIAL

## 2019-11-16 MED ORDER — HEPARIN (PORCINE) IN NACL 1000-0.9 UT/500ML-% IV SOLN
INTRAVENOUS | Status: AC
  Filled 2019-11-16: qty 1000

## 2019-11-16 MED ORDER — LIDOCAINE HCL (PF) 1 % IJ SOLN
INTRAMUSCULAR | Status: AC
  Filled 2019-11-16: qty 30

## 2019-11-16 MED ORDER — FENTANYL CITRATE (PF) 100 MCG/2ML IJ SOLN
INTRAMUSCULAR | Status: DC | PRN
  Administered 2019-11-16: 50 ug via INTRAVENOUS

## 2019-11-16 MED ORDER — IOHEXOL 350 MG/ML SOLN
INTRAVENOUS | Status: AC
  Filled 2019-11-16: qty 1

## 2019-11-16 MED ORDER — ACETAMINOPHEN 325 MG PO TABS: 650.0000 mg | ORAL_TABLET | ORAL | Status: DC | PRN

## 2019-11-16 MED ORDER — ASPIRIN 81 MG PO CHEW: 81.0000 mg | CHEWABLE_TABLET | ORAL | Status: DC

## 2019-11-16 MED ORDER — FLUTICASONE PROPIONATE 50 MCG/ACT NA SUSP: 2.0000 | Freq: Every day | NASAL | Status: DC | PRN

## 2019-11-16 MED ORDER — IOHEXOL 350 MG/ML SOLN
INTRAVENOUS | Status: DC | PRN
  Administered 2019-11-16: 50 mL

## 2019-11-16 MED ORDER — SODIUM CHLORIDE 0.9 % WEIGHT BASED INFUSION: 3.0000 mL/kg/h | INTRAVENOUS | Status: AC

## 2019-11-16 MED ORDER — ONDANSETRON HCL 4 MG/2ML IJ SOLN: 4.0000 mg | Freq: Four times a day (QID) | INTRAMUSCULAR | Status: DC | PRN

## 2019-11-16 MED ORDER — HYDROCHLOROTHIAZIDE 12.5 MG PO CAPS: 12.5000 mg | ORAL_CAPSULE | Freq: Every day | ORAL | Status: DC

## 2019-11-16 MED ORDER — SODIUM CHLORIDE 0.9% FLUSH: 3.0000 mL | Freq: Two times a day (BID) | INTRAVENOUS | Status: DC

## 2019-11-16 MED ORDER — PROPOFOL 10 MG/ML IV BOLUS
INTRAVENOUS | Status: DC | PRN
  Administered 2019-11-16: 25 mg via INTRAVENOUS

## 2019-11-16 MED ORDER — SODIUM CHLORIDE 0.9 % IV SOLN: 250.0000 mL | INTRAVENOUS | Status: DC | PRN

## 2019-11-16 MED ORDER — SODIUM CHLORIDE 0.9 % IV SOLN: INTRAVENOUS | Status: AC

## 2019-11-16 MED ORDER — LISINOPRIL 10 MG PO TABS: 10.0000 mg | ORAL_TABLET | Freq: Every day | ORAL | Status: DC

## 2019-11-16 MED ORDER — LACTATED RINGERS IV SOLN
INTRAVENOUS | Status: AC | PRN
  Administered 2019-11-16: 1000 mL via INTRAVENOUS

## 2019-11-16 MED ORDER — BUTAMBEN-TETRACAINE-BENZOCAINE 2-2-14 % EX AERO
INHALATION_SPRAY | CUTANEOUS | Status: DC | PRN
  Administered 2019-11-16: 2 via TOPICAL

## 2019-11-16 MED ORDER — HEPARIN SODIUM (PORCINE) 1000 UNIT/ML IJ SOLN
INTRAMUSCULAR | Status: DC | PRN
  Administered 2019-11-16: 6000 [IU] via INTRAVENOUS

## 2019-11-16 SURGICAL SUPPLY — 12 items
CATH 5FR JL3.5 JR4 ANG PIG MP (CATHETERS) ×1 IMPLANT
CATH BALLN WEDGE 5F 110CM (CATHETERS) ×1 IMPLANT
DEVICE RAD TR BAND REGULAR (VASCULAR PRODUCTS) ×1 IMPLANT
GLIDESHEATH SLEND A-KIT 6F 22G (SHEATH) ×1 IMPLANT
GUIDEWIRE INQWIRE 1.5J.035X260 (WIRE) IMPLANT
INQWIRE 1.5J .035X260CM (WIRE) ×2
KIT ENCORE 26 ADVANTAGE (KITS) IMPLANT
KIT HEART LEFT (KITS) ×2 IMPLANT
PACK CARDIAC CATHETERIZATION (CUSTOM PROCEDURE TRAY) ×2 IMPLANT
SHEATH GLIDE SLENDER 4/5FR (SHEATH) ×1 IMPLANT
TRANSDUCER W/STOPCOCK (MISCELLANEOUS) ×2 IMPLANT
TUBING CIL FLEX 10 FLL-RA (TUBING) ×2 IMPLANT

## 2019-11-16 NOTE — CV Procedure (Signed)
    TRANSESOPHAGEAL ECHOCARDIOGRAM   NAME:  PSALMS GARRINGER    MRN: [id number] DOB:  1965-07-26    ADMIT DATE: 11/16/2019  INDICATIONS: Moderate AS/AI  PROCEDURE:   Informed consent was obtained prior to the procedure. The risks, benefits and alternatives for the procedure were discussed and the patient comprehended these risks.  Risks include, but are not limited to, cough, sore throat, vomiting, nausea, somnolence, esophageal and stomach trauma or perforation, bleeding, low blood pressure, aspiration, pneumonia, infection, trauma to the teeth and death.    Procedural time out performed. The oropharynx was anesthetized with topical 1% benzocaine.    Sedation provided by anesthesia, Dr. Esperanza Richters. 317 mg propofol. 50 mg fentanyl, 2 mg versed.  The patient's heart rate, blood pressure, and oxygen saturation are monitored continuously during the procedure. The period of conscious sedation is 25 minutes, of which I was present face-to-face 100% of this time.   The transesophageal probe was inserted in the esophagus and stomach without difficulty and multiple views were obtained.   COMPLICATIONS:    There were no immediate complications.  KEY FINDINGS: 1. Moderate AS 2. Moderate AI 3. 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  4. LVEF 45-50%.  Full report to follow.  [place] T. Audie Box, [place]  [address] [place], [place] [address] [phone]  10:47 AM

## 2019-11-16 NOTE — Discharge Instructions (Signed)
Radial Site Care  This sheet gives you information about how to care for yourself after your procedure. Your health care provider may also give you more specific instructions. If you have problems or questions, contact your health care provider. What can I expect after the procedure? After the procedure, it is common to have:  Bruising and tenderness at the catheter insertion area. Follow these instructions at home: Medicines  Take over-the-counter and prescription medicines only as told by your health care provider. Insertion site care  Follow instructions from your health care provider about how to take care of your insertion site. Make sure you: ? Wash your hands with soap and water before you change your bandage (dressing). If soap and water are not available, use hand sanitizer. ? Change your dressing as told by your health care provider. ? Leave stitches (sutures), skin glue, or adhesive strips in place. These skin closures may need to stay in place for 2 weeks or longer. If adhesive strip edges start to loosen and curl up, you may trim the loose edges. Do not remove adhesive strips completely unless your health care provider tells you to do that.  Check your insertion site every day for signs of infection. Check for: ? Redness, swelling, or pain. ? Fluid or blood. ? Pus or a bad smell. ? Warmth.  Do not take baths, swim, or use a hot tub until your health care provider approves.  You may shower 24-48 hours after the procedure, or as directed by your health care provider. ? Remove the dressing and gently wash the site with plain soap and water. ? Pat the area dry with a clean towel. ? Do not rub the site. That could cause bleeding.  Do not apply powder or lotion to the site. Activity   For 24 hours after the procedure, or as directed by your health care provider: ? Do not flex or bend the affected arm. ? Do not push or pull heavy objects with the affected arm. ? Do not  drive yourself home from the hospital or clinic. You may drive 24 hours after the procedure unless your health care provider tells you not to. ? Do not operate machinery or power tools.  Do not lift anything that is heavier than 10 lb (4.5 kg), or the limit that you are told, until your health care provider says that it is safe.  Ask your health care provider when it is okay to: ? Return to work or school. ? Resume usual physical activities or sports. ? Resume sexual activity. General instructions  If the catheter site starts to bleed, raise your arm and put firm pressure on the site. If the bleeding does not stop, get help right away. This is a medical emergency.  If you went home on the same day as your procedure, a responsible adult should be with you for the first 24 hours after you arrive home.  Keep all follow-up visits as told by your health care provider. This is important. Contact a health care provider if:  You have a fever.  You have redness, swelling, or yellow drainage around your insertion site. Get help right away if:  You have unusual pain at the radial site.  The catheter insertion area swells very fast.  The insertion area is bleeding, and the bleeding does not stop when you hold steady pressure on the area.  Your arm or hand becomes pale, cool, tingly, or numb. These symptoms may represent a serious problem   Summary After the procedure, it is common to have bruising and tenderness at the site. Follow instructions from your health care provider about how to take care of your radial site wound. Check the wound every day for signs of infection. Do not lift anything that is heavier than 10 lb (4.5 kg), or the limit that you are told, until your health care provider says that it is safe. This information is not intended to replace advice given to  you by your health care provider. Make sure you discuss any questions you have with your health care provider. Document Revised: 07/29/2017 Document Reviewed: 07/29/2017 Elsevier Patient Education  [employer].     Return to work May 25 at which time he will be at full duty without restrictions.  Resume Metformin 11/17/2019  Increase Toprol-XL to 50 mg/day from 25 mg/day.  Increase lisinopril to 20 mg/day from 10 mg/day  Resume Eliquis at 8 PM today  Stop hydrochlorothiazide  Basic metabolic panel in 10 days

## 2019-11-16 NOTE — Interval H&P Note (Signed)
Cath Lab Visit (complete for each Cath Lab visit)  Clinical Evaluation Leading to the Procedure:   ACS: No.  Non-ACS:    Anginal Classification: CCS III  Anti-ischemic medical therapy: Minimal Therapy (1 class of medications)  Non-Invasive Test Results: Intermediate-risk stress test findings: cardiac mortality 1-3%/year  Prior CABG: No previous CABG      History and Physical Interval Note:  11/16/2019 11:10 AM  Dalton [name]  has presented today for surgery, with the diagnosis of heart failure - afib.  The various methods of treatment have been discussed with the patient and family. After consideration of risks, benefits and other options for treatment, the patient has consented to  Procedure(s): RIGHT/LEFT HEART CATH AND CORONARY ANGIOGRAPHY (N/A) as a surgical intervention.  The patient's history has been reviewed, patient examined, no change in status, stable for surgery.  I have reviewed the patient's chart and labs.  Questions were answered to the patient's satisfaction.     [name] III

## 2019-11-16 NOTE — Interval H&P Note (Signed)
History and Physical Interval Note:  11/16/2019 8:48 AM  [name]  has presented today for surgery, with the diagnosis of HEART FAILURE, AORTIC STENOSIS.  The various methods of treatment have been discussed with the patient and family. After consideration of risks, benefits and other options for treatment, the patient has consented to  Procedure(s): TRANSESOPHAGEAL ECHOCARDIOGRAM (TEE) (N/A) as a surgical intervention.  The patient's history has been reviewed, patient examined, no change in status, stable for surgery.  I have reviewed the patient's chart and labs.  Questions were answered to the patient's satisfaction.    NPO since midnight. Allergies reviewed. Eliquis held for cath after TEE. TEE to evaluate aortic stenosis and regurgitation. CV exam benign.   [place] Bells T. Audie Box, [place]  [address] [place], [place] [address] [phone]  8:48 AM

## 2019-11-16 NOTE — Progress Notes (Signed)
  Echocardiogram Echocardiogram Transesophageal has been performed.  Dalton [name] 11/16/2019, 11:09 AM

## 2019-11-16 NOTE — Anesthesia Postprocedure Evaluation (Signed)
Anesthesia Post Note  Patient: Dalton [name]  Procedure(s) Performed: TRANSESOPHAGEAL ECHOCARDIOGRAM (TEE) (N/A )     Patient location during evaluation: Endoscopy Anesthesia Type: MAC Level of consciousness: awake and alert Pain management: pain level controlled Vital Signs Assessment: post-procedure vital signs reviewed and stable Respiratory status: spontaneous breathing, nonlabored ventilation, respiratory function stable and patient connected to nasal cannula oxygen Cardiovascular status: blood pressure returned to baseline and stable Postop Assessment: no apparent nausea or vomiting Anesthetic complications: no    Last Vitals:  Vitals:   11/16/19 1149 11/16/19 1212  BP: (!) 146/89   Pulse: 87 78  Resp: (!) 30   Temp:    SpO2: 100% 100%    Last Pain:  Vitals:   11/16/19 1152  TempSrc:   PainSc: 0-No pain                 Barnet Glasgow

## 2019-11-16 NOTE — Anesthesia Procedure Notes (Signed)
Procedure Name: MAC Date/Time: 11/16/2019 10:06 AM Performed by: Janace Litten, CRNA Pre-anesthesia Checklist: Patient identified, Emergency Drugs available, Suction available and Patient being monitored Patient Re-evaluated:Patient Re-evaluated prior to induction Oxygen Delivery Method: Nasal cannula

## 2019-11-16 NOTE — Transfer of Care (Signed)
Immediate Anesthesia Transfer of Care Note  Patient: Dalton [name]  Procedure(s) Performed: TRANSESOPHAGEAL ECHOCARDIOGRAM (TEE) (N/A )  Patient Location: Cath Lab  Anesthesia Type:MAC  Level of Consciousness: awake, alert  and responds to stimulation  Airway & Oxygen Therapy: Patient Spontanous Breathing and Patient connected to nasal cannula oxygen  Post-op Assessment: Report given to RN and Post -op Vital signs reviewed and stable  Post vital signs: Reviewed and stable  Last Vitals:  Vitals Value Taken Time  BP    Temp    Pulse    Resp    SpO2      Last Pain:  Vitals:   11/16/19 0848  TempSrc: Oral  PainSc: 0-No pain         Complications: No apparent anesthesia complications

## 2019-11-17 ENCOUNTER — Telehealth: Payer: Self-pay | Admitting: Interventional Cardiology

## 2019-11-17 NOTE — Telephone Encounter (Signed)
Patient calling about gaining 4 lbs in 24 hour period after heart cath. Patient denies SOB or chest pain. Patient's BP 144/85 and HR 69, this is before taking morning medications. Will forward to Dr. Tamala Julian for advisement.

## 2019-11-17 NOTE — Telephone Encounter (Signed)
Spoke with Dr. Tamala Julian and he said to find out if pt is SOB.  If not, restart HCTZ until he is back down to baseline weight and then d/c.  Spoke with wife and she denies SOB or swelling.  Advised her to have pt take HCTZ until weight back down to normal, then d/c.  Wife agreeable to plan.

## 2019-11-17 NOTE — Telephone Encounter (Signed)
Patient's wife, Butch Penny, states that the patient has gained 4 pounds since his heart cath yesterday. She states the discharge instructions state to call the office if there is any weight gain. She denies any swelling.

## 2019-11-18 ENCOUNTER — Encounter: Payer: Self-pay | Admitting: *Deleted

## 2019-11-24 NOTE — Progress Notes (Signed)
Virtual Visit via Telephone Note   This visit type was conducted due to national recommendations for restrictions regarding the COVID-19 Pandemic (e.g. social distancing) in an effort to limit this patient's exposure and mitigate transmission in our community.  Due to his co-morbid illnesses, this patient is at least at moderate risk for complications without adequate follow up.  This format is felt to be most appropriate for this patient at this time.  The patient did not have access to video technology/had technical difficulties with video requiring transitioning to audio format only (telephone).  All issues noted in this document were discussed and addressed.  No physical exam could be performed with this format.  Please refer to the patient's chart for his  consent to telehealth for [facility].   Evaluation Performed:  Follow-up visit  This visit type was conducted due to national recommendations for restrictions regarding the COVID-19 Pandemic (e.g. social distancing).  This format is felt to be most appropriate for this patient at this time.  All issues noted in this document were discussed and addressed.  No physical exam was performed (except for noted visual exam findings with Video Visits).  Please refer to the patient's chart (MyChart message for video visits and phone note for telephone visits) for the patient's consent to telehealth for [facility].  Date:  11/25/2019   ID:  Dalton [name], DOB 1966-02-25, MRN [id number]  Patient Location:  Home  Provider location:   [place]  PCP:  Einar Pheasant, MD  Cardiologist:  Sinclair Grooms, MD  SLeep Medicine:  Fransico Him, MD Electrophysiologist:  None   Chief Complaint:  OSA  History of Present Illness:    Dalton [name] is a 54 y.o. male who presents via audio/video conferencing for a telehealth visit today.    Dalton [name] is a 54 y.o. male with a hx of bicuspid aortic valve with moderate aortic  stenosis, CAD and mild obstructive sleep apnea with last HST 02/2019 showing an AHI of 12/hr and is on CPAP. He is doing well with his CPAP device and thinks that he has gotten used to it.  He tolerates the mask and feels the pressure is adequate.  He usually goes to bed at 7-7:30pm and gets up at 5am.  He wakes up a lot at night due to the hose getting in the way and he has to adjust it.  Some days he feels sleepy in the am some depending on how he slept the night before.  He occasionally will nap during the day but not really because he has to.  He takes Trazadone 50mg  at bedtime to sleep.  He denies any significant mouth dryness or nasal congestion. Occasionally he will have some nasal dryness.  He does not think that he snores.    The patient does not have symptoms concerning for COVID-19 infection (fever, chills, cough, or new shortness of breath).   Prior CV studies:   The following studies were reviewed today:  PAP compliance download and HST 02/2019  Past Medical History:  Diagnosis Date  . Aortic valve stenosis    a. Bicuspid AV - 2D Echo 06/2014 - mod AS, mild AI, mildly dilated aortic root.  [name] CAD (coronary artery disease)   . Carotid artery disease ([place])    a. Mild by duplex 05/2015 - 1-39% BICA. Repeat due 05/2017.  . Dilated aortic root ([place])    a. By echo 06/2014.  . Environmental allergies   .  Hypercholesterolemia   . Hypertension   . Obesity (BMI 30-39.9) 04/26/2018  . Sleep apnea   . SVT (supraventricular tachycardia) ([place])    a. h/o SVT - Ablation done 2013.   Past Surgical History:  Procedure Laterality Date  . CARDIAC ELECTROPHYSIOLOGY STUDY AND ABLATION  08/21/2011  . COLONOSCOPY WITH PROPOFOL N/A 06/26/2017   Procedure: COLONOSCOPY WITH PROPOFOL;  Surgeon: Manya Silvas, MD;  Location: [facility] ENDOSCOPY;  Service: Endoscopy;  Laterality: N/A;  . ESOPHAGOGASTRODUODENOSCOPY (EGD) WITH PROPOFOL N/A 06/26/2017   Procedure: ESOPHAGOGASTRODUODENOSCOPY (EGD) WITH  PROPOFOL;  Surgeon: Manya Silvas, MD;  Location: [facility] ENDOSCOPY;  Service: Endoscopy;  Laterality: N/A;  . RIGHT/LEFT HEART CATH AND CORONARY ANGIOGRAPHY N/A 11/16/2019   Procedure: RIGHT/LEFT HEART CATH AND CORONARY ANGIOGRAPHY;  Surgeon: [name], MD;  Location: [facility] LAB;  Service: Cardiovascular;  Laterality: N/A;  . SEPTOPLASTY N/A 03/22/2015   Procedure: SEPTOPLASTY  WITH RIGHT INFERIOR TURBINATE REDUCTION;  Surgeon: Margaretha Sheffield, MD;  Location: [place];  Service: ENT;  Laterality: N/A;  . SUPRAVENTRICULAR TACHYCARDIA ABLATION N/A 08/21/2011   Procedure: SUPRAVENTRICULAR TACHYCARDIA ABLATION;  Surgeon: Evans Lance, MD;  Location: [facility] CATH LAB;  Service: Cardiovascular;  Laterality: N/A;  . surgery for non descending testicle    . TEE WITHOUT CARDIOVERSION N/A 11/16/2019   Procedure: TRANSESOPHAGEAL ECHOCARDIOGRAM (TEE);  Surgeon: Geralynn Rile, MD;  Location: [place];  Service: Cardiovascular;  Laterality: N/A;  . TONSILLECTOMY       Current Meds  Medication Sig  . acetaminophen (TYLENOL) 500 MG tablet Take 500-1,000 mg by mouth every 6 (six) hours as needed (for pain.).  [name] apixaban (ELIQUIS) 5 MG TABS tablet Take 1 tablet (5 mg total) by mouth 2 (two) times daily.  [name] atorvastatin (LIPITOR) 40 MG tablet Take 1 tablet (40 mg total) by mouth daily.  . cetirizine (ZYRTEC) 10 MG tablet Take 10 mg by mouth 2 (two) times daily.   . fluticasone (FLONASE) 50 MCG/ACT nasal spray Place 2 sprays into both nostrils daily as needed for allergies or rhinitis.  [name] glucose blood (ONETOUCH VERIO) test strip USE TWICE DAILY AS DIRECTED  . Lancets (ONETOUCH DELICA PLUS 123XX123) MISC USE TWICE DAILY AS DIRECTED  . lisinopril (ZESTRIL) 20 MG tablet Take 20 mg by mouth daily.  . metFORMIN (GLUCOPHAGE) 1000 MG tablet Take 1 tablet (1,000 mg total) by mouth 2 (two) times daily with a meal.  . metoprolol succinate (TOPROL-XL) 50 MG 24 hr tablet Take 1 tablet (50 mg  total) by mouth daily.  . NON FORMULARY as directed. CPAP     Allergies:   Patient has no known allergies.   Social History   Tobacco Use  . Smoking status: Former Smoker    Types: Cigarettes    Quit date: 11/04/1988    Years since quitting: 31.0  . Smokeless tobacco: Never Used  Substance Use Topics  . Alcohol use: Yes    Alcohol/week: 8.0 standard drinks    Types: 7 Glasses of wine, 1 Shots of liquor per week    Comment: occasionally  . Drug use: Not Currently    Comment: many years ago in highschool     Family Hx: The patient's family history includes Aortic stenosis in his maternal grandfather; Breast cancer in his maternal grandmother; Cancer in his maternal grandfather and another family member; Diabetes in an other family member.  ROS:   Please see the history of present illness.     All other systems  reviewed and are negative.   Labs/Other Tests and Data Reviewed:    Recent Labs: 06/09/2019: ALT 31; TSH 1.22 11/11/2019: BUN 14; Creatinine, Ser 1.02; Platelets 197 11/16/2019: Hemoglobin 14.6; Potassium 4.3; Sodium 139   Recent Lipid Panel Lab Results  Component Value Date/Time   CHOL 140 06/09/2019 01:13 PM   TRIG 144.0 06/09/2019 01:13 PM   HDL 31.60 (L) 06/09/2019 01:13 PM   CHOLHDL 4 06/09/2019 01:13 PM   LDLCALC 80 06/09/2019 01:13 PM   LDLDIRECT 228.0 05/14/2018 09:51 AM    Wt Readings from Last 3 Encounters:  11/25/19 276 lb (125.2 kg)  11/16/19 270 lb (122.5 kg)  11/11/19 278 lb 6.4 oz (126.3 kg)     Objective:    Vital Signs:  BP 126/83   Pulse 71   Ht 6\' 1"  (1.854 m)   Wt 276 lb (125.2 kg)   BMI 36.41 kg/m    CONSTITUTIONAL:  Well nourished, well developed male in no acute distress.  EYES: anicteric MOUTH: oral mucosa is pink RESPIRATORY: Normal respiratory effort, symmetric expansion CARDIOVASCULAR: No peripheral edema SKIN: No rash, lesions or ulcers MUSCULOSKELETAL: no digital cyanosis NEURO: Cranial Nerves II-XII grossly intact,  moves all extremities PSYCH: Intact judgement and insight.  A&O x 3, Mood/affect appropriate   ASSESSMENT & PLAN:    1.  OSA - The patient is tolerating PAP therapy well without any problems. The PAP download was reviewed today and showed an AHI of 2.7/hr on auto PAP with 100% compliance in using more than 4 hours nightly.  The patient has been using and benefiting from PAP use and will continue to benefit from therapy.  -I am going to order him a new mask with the hose coming out on top the head so he does not get tangled up in it  2.  HTN -BP controlled -continue Lisinopril 20mg  daily and Toprol XL 50mg  daily  3.  Obesity -I have encouraged him to get into a routine exercise program and cut back on carbs and portions.   COVID-19 Education: The signs and symptoms of COVID-19 were discussed with the patient and how to seek care for testing (follow up with PCP or arrange E-visit).  The importance of social distancing was discussed today.  Patient Risk:   After full review of this patient's clinical status, I feel that they are at least moderate risk at this time.  Time:   Today, I have spent 20 minutes on telemedicine discussing medical problems including OSA, Obesity and HTN and reviewing patient's chart including PAP compliance download and HST 02/2019.  Medication Adjustments/Labs and Tests Ordered: Current medicines are reviewed at length with the patient today.  Concerns regarding medicines are outlined above.  Tests Ordered: No orders of the defined types were placed in this encounter.  Medication Changes: No orders of the defined types were placed in this encounter.   Disposition:  Follow up in 1 year(s)  Signed, Fransico Him, MD  11/25/2019 9:57 AM    [facility]

## 2019-11-25 ENCOUNTER — Telehealth (INDEPENDENT_AMBULATORY_CARE_PROVIDER_SITE_OTHER): Payer: Managed Care, Other (non HMO) | Admitting: Cardiology

## 2019-11-25 ENCOUNTER — Encounter: Payer: Self-pay | Admitting: Cardiology

## 2019-11-25 VITALS — BP 126/83 | HR 71 | Ht 73.0 in | Wt 276.0 lb

## 2019-11-25 DIAGNOSIS — I10 Essential (primary) hypertension: Secondary | ICD-10-CM

## 2019-11-25 DIAGNOSIS — G4733 Obstructive sleep apnea (adult) (pediatric): Secondary | ICD-10-CM

## 2019-11-25 DIAGNOSIS — E669 Obesity, unspecified: Secondary | ICD-10-CM | POA: Diagnosis not present

## 2019-11-25 NOTE — Patient Instructions (Signed)

## 2019-11-28 ENCOUNTER — Other Ambulatory Visit: Payer: Managed Care, Other (non HMO) | Admitting: *Deleted

## 2019-11-28 ENCOUNTER — Encounter: Payer: Self-pay | Admitting: *Deleted

## 2019-11-28 ENCOUNTER — Other Ambulatory Visit: Payer: Self-pay

## 2019-11-28 DIAGNOSIS — I25118 Atherosclerotic heart disease of native coronary artery with other forms of angina pectoris: Secondary | ICD-10-CM

## 2019-11-28 LAB — BASIC METABOLIC PANEL
BUN/Creatinine Ratio: 15 (ref 9–20)
BUN: 14 mg/dL (ref 6–24)
CO2: 23 mmol/L (ref 20–29)
Calcium: 9.4 mg/dL (ref 8.7–10.2)
Chloride: 102 mmol/L (ref 96–106)
Creatinine, Ser: 0.91 mg/dL (ref 0.76–1.27)
GFR calc Af Amer: 110 mL/min/{1.73_m2} (ref >59)
GFR calc non Af Amer: 95 mL/min/{1.73_m2} (ref >59)
Glucose: 135 mg/dL — ABNORMAL HIGH (ref 65–99)
Potassium: 4.5 mmol/L (ref 3.5–5.2)
Sodium: 141 mmol/L (ref 134–144)

## 2019-12-22 NOTE — Progress Notes (Signed)
Cardiology Office Note:    Date:  12/26/2019   ID:  NAIF ALABI, DOB 31-Dec-1965, MRN [id number]  PCP:  Einar Pheasant, MD  Cardiologist:  Sinclair Grooms, MD   Referring MD: Einar Pheasant, MD   Chief Complaint  Patient presents with  . Shortness of Breath  . Congestive Heart Failure  . Cardiac Valve Problem    History of Present Illness:    Dalton [name] is a 54 y.o. male with a hx of biscupid aortic valve with AS, prior SVT s/p RF ablation, HTN, HLD who presents for follow-up. Last echo 06/2018 EF 45%:, grade 1 DD, severely calcifiedbicuspidAV leaflets, moderate aortic stenosis withmoderateAI, mildly dilated ascending aorta. Recent echo shows increasing LV cavity size.  [name] continues to have less endurance than he would like.  He has mixed aortic valve regurgitation and stenosis.  Most recent evaluation demonstrated moderate involvement by each valve lesion.  This is led me to consider decreasing LV function based on other etiologies.  His valve TAVR is the obvious concern.  Is able to work and is having no specific limitations.  We have had him reevaluated for sleep apnea to see if this would help improve his overall condition and protect LV function.  He has had chronic atrial fibrillation now for years.  Past Medical History:  Diagnosis Date  . Aortic valve stenosis    a. Bicuspid AV - 2D Echo 06/2014 - mod AS, mild AI, mildly dilated aortic root.  [name] CAD (coronary artery disease)   . Carotid artery disease ([place])    a. Mild by duplex 05/2015 - 1-39% BICA. Repeat due 05/2017.  . Dilated aortic root ([place])    a. By echo 06/2014.  . Environmental allergies   . Hypercholesterolemia   . Hypertension   . Obesity (BMI 30-39.9) 04/26/2018  . Sleep apnea   . SVT (supraventricular tachycardia) ([place])    a. h/o SVT - Ablation done 2013.    Past Surgical History:  Procedure Laterality Date  . CARDIAC ELECTROPHYSIOLOGY STUDY AND ABLATION  08/21/2011  .  COLONOSCOPY WITH PROPOFOL N/A 06/26/2017   Procedure: COLONOSCOPY WITH PROPOFOL;  Surgeon: Manya Silvas, MD;  Location: [facility] ENDOSCOPY;  Service: Endoscopy;  Laterality: N/A;  . ESOPHAGOGASTRODUODENOSCOPY (EGD) WITH PROPOFOL N/A 06/26/2017   Procedure: ESOPHAGOGASTRODUODENOSCOPY (EGD) WITH PROPOFOL;  Surgeon: Manya Silvas, MD;  Location: [facility] ENDOSCOPY;  Service: Endoscopy;  Laterality: N/A;  . RIGHT/LEFT HEART CATH AND CORONARY ANGIOGRAPHY N/A 11/16/2019   Procedure: RIGHT/LEFT HEART CATH AND CORONARY ANGIOGRAPHY;  Surgeon: [name], MD;  Location: [facility] LAB;  Service: Cardiovascular;  Laterality: N/A;  . SEPTOPLASTY N/A 03/22/2015   Procedure: SEPTOPLASTY  WITH RIGHT INFERIOR TURBINATE REDUCTION;  Surgeon: Margaretha Sheffield, MD;  Location: [place];  Service: ENT;  Laterality: N/A;  . SUPRAVENTRICULAR TACHYCARDIA ABLATION N/A 08/21/2011   Procedure: SUPRAVENTRICULAR TACHYCARDIA ABLATION;  Surgeon: Evans Lance, MD;  Location: [facility] CATH LAB;  Service: Cardiovascular;  Laterality: N/A;  . surgery for non descending testicle    . TEE WITHOUT CARDIOVERSION N/A 11/16/2019   Procedure: TRANSESOPHAGEAL ECHOCARDIOGRAM (TEE);  Surgeon: Geralynn Rile, MD;  Location: [place];  Service: Cardiovascular;  Laterality: N/A;  . TONSILLECTOMY      Current Medications: Current Meds  Medication Sig  . acetaminophen (TYLENOL) 500 MG tablet Take 500-1,000 mg by mouth every 6 (six) hours as needed (for pain.).  [name] apixaban (ELIQUIS) 5 MG TABS tablet Take 1 tablet (5 mg  total) by mouth 2 (two) times daily.  [name] atorvastatin (LIPITOR) 40 MG tablet Take 1 tablet (40 mg total) by mouth daily.  . cetirizine (ZYRTEC) 10 MG tablet Take 10 mg by mouth 2 (two) times daily.   . citalopram (CELEXA) 20 MG tablet Take 20 mg by mouth daily.  . fluticasone (FLONASE) 50 MCG/ACT nasal spray Place 2 sprays into both nostrils daily as needed for allergies or rhinitis.  [name] glucose blood (ONETOUCH  VERIO) test strip USE TWICE DAILY AS DIRECTED  . Lancets (ONETOUCH DELICA PLUS [id number]) MISC USE TWICE DAILY AS DIRECTED  . lisinopril (ZESTRIL) 20 MG tablet Take 20 mg by mouth daily.  . metFORMIN (GLUCOPHAGE) 1000 MG tablet Take 1 tablet (1,000 mg total) by mouth 2 (two) times daily with a meal.  . metoprolol succinate (TOPROL-XL) 50 MG 24 hr tablet Take 1 tablet (50 mg total) by mouth daily.  . NON FORMULARY as directed. CPAP  . traZODone (DESYREL) 50 MG tablet Take 50 mg by mouth at bedtime.      Allergies:   Patient has no known allergies.   Social History   Socioeconomic History  . Marital status: Married    Spouse name: Not on file  . Number of children: 2  . Years of education: Not on file  . Highest education level: Not on file  Occupational History  . Occupation: truck Education administrator: Insurance account manager  Tobacco Use  . Smoking status: Former Smoker    Types: Cigarettes    Quit date: 11/04/1988    Years since quitting: 31.1  . Smokeless tobacco: Never Used  Vaping Use  . Vaping Use: Never used  Substance and Sexual Activity  . Alcohol use: Yes    Alcohol/week: 8.0 standard drinks    Types: 7 Glasses of wine, 1 Shots of liquor per week    Comment: occasionally  . Drug use: Not Currently    Comment: many years ago in highschool  . Sexual activity: Not Currently  Other Topics Concern  . Not on file  Social History Narrative   Very rare exercise   Social Determinants of Health   Financial Resource Strain:   . Difficulty of Paying Living Expenses:   Food Insecurity:   . Worried About Charity fundraiser in the Last Year:   . Arboriculturist in the Last Year:   Transportation Needs:   . Film/video editor (Medical):   [name] Lack of Transportation (Non-Medical):   Physical Activity:   . Days of Exercise per Week:   . Minutes of Exercise per Session:   Stress:   . Feeling of Stress :   Social Connections:   . Frequency of Communication with Friends and  Family:   . Frequency of Social Gatherings with Friends and Family:   . Attends Religious Services:   . Active Member of Clubs or Organizations:   . Attends Archivist Meetings:   [name] Marital Status:      Family History: The patient's family history includes Aortic stenosis in his maternal grandfather; Breast cancer in his maternal grandmother; Cancer in his maternal grandfather and another family member; Diabetes in an other family member.  ROS:   Please see the history of present illness.    States he would like to be able to do better than he currently is.  His wife is present as usual and states that he gets short of breath faster than she feels he should.  All other systems reviewed and are negative.  EKGs/Labs/Other Studies Reviewed:    The following studies were reviewed today:  CARDIAC CATH 11/16/19:  Mild aortic stenosis with mean gradient 14 mmHg and calculated aortic valve area of 1.69 cm  Moderate aortic regurgitation documented on both transthoracic and transesophageal echocardiography.  Mild pulmonary hypertension  Elevated left ventricular end-diastolic pressure 20 mmHg consistent with chronic combined systolic and diastolic heart failure.  Echo documentation of ejection fraction in the 45% range which has gradually developed over the past 3 years.  Normal left main  Luminal irregularities in LAD  Dominant right coronary, widely patent  Nondominant right coronary.  RECOMMENDATIONS:  Does not appear that the aortic valve is severe enough to cause decreased LV function.  Suspect primary myocardial process plus/minus impact of atrial fibrillation rate control on LV function.  We will uptitrate Toprol-XL to 50 mg/day; increase lisinopril to 20 mg/day; remove HCTZ to allow blood pressure that will sustain up titration of guideline directed therapy.  Consider adding MRA if diuretic needed  Other considerations for decreased LV function could be related to  sleep apnea (discussed with Dr. Radford Pax and patient's most recent download indicates excellent therapy); infiltrative process such as amyloid; other.  May ultimately need heart failure clinic consultation.  Will not need referral to the valve clinic at this time.  TEE 11/16/2019: IMPRESSIONS    1. Bicuspid aortic valve with fusion of the NCC/RCC with raphe ([place]  type 1). There is severely restricted movement of the NCC/RCC leaflets.  The Howe moves without restriction. V max 2.8 m/s, MG 19 mmHG, AVA by VTI  1.30 cm2, DI 0.29. AVA by 3D MPR  planimetry is 1.48-1.68 cm2. AVA by planimetry will be slightly higher  than VTI method. Overall, there is moderate aortic stenosis. Regarding  regurgitation, the vena contracta is 0.40 cm by MPR assessment. 3D ERO is  0.21 cm2 consistent with moderate  aortic regurgitaiton. PHT is difficult to interpret in setting of atrial  fibrillation. In conclusion, there is combined moderate aortic stenosis  and moderate aortic regurgitation. The aortic valve is bicuspid. Aortic  valve regurgitation is moderate.  Moderate aortic valve stenosis. Aortic valve area, by VTI measures 1.30  cm. Aortic valve mean gradient measures 19.2 mmHg. Aortic valve Vmax  measures 2.84 m/s.  2. Left ventricular ejection fraction, by estimation, is 45 to 50%. Left  ventricular ejection fraction by 3D volume is 47 %. The left ventricle has  mildly decreased function. The left ventricle demonstrates global  hypokinesis.  3. Right ventricular systolic function is normal. The right ventricular  size is normal. There is normal pulmonary artery systolic pressure.  4. No left atrial/left atrial appendage thrombus was detected. The LAA  emptying velocity was 26 cm/s.  5. The mitral valve is normal in structure. Trivial mitral valve  regurgitation. No evidence of mitral stenosis.  6. There is mild (Grade II) plaque involving the descending aorta.    EKG:  EKG not repeated.   The most recent tracing performed Nov 14, 2019 revealed A. fib, controlled rate, prominent voltage.  Recent Labs: 06/09/2019: ALT 31; TSH 1.22 11/11/2019: Platelets 197 11/16/2019: Hemoglobin 14.6 11/28/2019: BUN 14; Creatinine, Ser 0.91; Potassium 4.5; Sodium 141  Recent Lipid Panel    Component Value Date/Time   CHOL 140 06/09/2019 1313   TRIG 144.0 06/09/2019 1313   HDL 31.60 (L) 06/09/2019 1313   CHOLHDL 4 06/09/2019 1313   VLDL 28.8 06/09/2019 1313   LDLCALC 80 06/09/2019  1313   LDLDIRECT 228.0 05/14/2018 0951    Physical Exam:    VS:  BP 132/84   Pulse 85   Ht 6\' 1"  (1.854 m)   Wt 278 lb 12.8 oz (126.5 kg)   SpO2 97%   BMI 36.78 kg/m     Wt Readings from Last 3 Encounters:  12/26/19 278 lb 12.8 oz (126.5 kg)  11/25/19 276 lb (125.2 kg)  11/16/19 270 lb (122.5 kg)     GEN: Obese. No acute distress HEENT: Normal NECK: No JVD. LYMPHATICS: No lymphadenopathy CARDIAC: 3/6 right upper sternal diamond-shaped systolic aortic stenosis murmur.  2/6 to 3/6 decrescendo left mid sternal border aortic regurgitation murmur.  RRR without gallop, or edema. VASCULAR:  Normal Pulses. No bruits. RESPIRATORY:  Clear to auscultation without rales, wheezing or rhonchi  ABDOMEN: Soft, non-tender, non-distended, No pulsatile mass, MUSCULOSKELETAL: No deformity  SKIN: Warm and dry NEUROLOGIC:  Alert and oriented x 3 PSYCHIATRIC:  Normal affect   ASSESSMENT:    1. Nonrheumatic aortic valve stenosis   2. Persistent atrial fibrillation ([place])   3. Chronic combined systolic and diastolic HF (heart failure), NYHA class 2 ([place])   4. Coronary artery disease of native artery of native heart with stable angina pectoris ([place])   5. Hyperlipidemia, unspecified hyperlipidemia type   6. Chronic anticoagulation   7. Essential hypertension   8. Obstructive sleep apnea syndrome   9. Educated about COVID-19 virus infection    PLAN:    In order of problems listed above:  1. Moderate combined AS  and AR in setting of functional bicuspid Ao valve with fused raphe.  As I have gradually watched LV function decline based upon readings and with the patient complaining of dyspnea on exertion, I will consult of the aortic valve clinic to determine if consideration of aortic valve replacement is indicated at this time. 2. Rate control is good on current therapy including Toprol-XL 50 mg/day. 3. Titrate heart failure therapy and refer Ao Valve clinic for opinion concerning the valve which at this time I think is not severe enough to require replacement but we will see what opinion develops.  In the meantime I will continue to titrate heart failure therapy by adding mineralocorticoid receptor antagonist, spironolactone 12.5 mg/day.  Bmet in 7 to 10 days.  In 3 months we will consider adding an SGLT2, [place]. 4. LDL cholesterol target should be less than 70. 5. Continue chronic anticoagulation therapy for stroke prevention in the form of apixaban 5 mg twice daily. 6. Target blood pressure 130/80 mmHg. 7. Continue CPAP.  Consider Inspire therapy if he continues to have difficulty wearing CPAP. 8. He has been vaccinated and continues to practice social distancing.     Medication Adjustments/Labs and Tests Ordered: Current medicines are reviewed at length with the patient today.  Concerns regarding medicines are outlined above.  Orders Placed This Encounter  Procedures  . Basic metabolic panel   Meds ordered this encounter  Medications  . spironolactone (ALDACTONE) 25 MG tablet    Sig: Take 0.5 tablets (12.5 mg total) by mouth daily.    Dispense:  45 tablet    Refill:  3    Patient Instructions  Medication Instructions:  1) START Spironolactone 12.5mg  once daily  *If you need a refill on your cardiac medications before your next appointment, please call your pharmacy*   Lab Work: BMET in 7-10 days  If you have labs (blood work) drawn today and your tests are completely normal, you  will  receive your results only by: [name] MyChart Message (if you have MyChart) OR . A paper copy in the mail If you have any lab test that is abnormal or we need to change your treatment, we will call you to review the results.   Testing/Procedures: None   Follow-Up: At [facility], you and your health needs are our priority.  As part of our continuing mission to provide you with exceptional heart care, we have created designated Provider Care Teams.  These Care Teams include your primary Cardiologist (physician) and Advanced Practice Providers (APPs -  Physician Assistants and Nurse Practitioners) who all work together to provide you with the care you need, when you need it.  We recommend signing up for the patient portal called "MyChart".  Sign up information is provided on this After Visit Summary.  MyChart is used to connect with patients for Virtual Visits (Telemedicine).  Patients are able to view lab/test results, encounter notes, upcoming appointments, etc.  Non-urgent messages can be sent to your provider as well.   To learn more about what you can do with MyChart, go to [email or web address].    Your next appointment:   3 month(s)  The format for your next appointment:   In Person  Provider:   You may see Sinclair Grooms, MD or one of the following Advanced Practice Providers on your designated Care Team:    Truitt Merle, NP  Cecilie Kicks, NP  Kathyrn Drown, NP    Other Instructions      Signed, Sinclair Grooms, MD  12/26/2019 10:58 AM    [place]

## 2019-12-26 ENCOUNTER — Ambulatory Visit (INDEPENDENT_AMBULATORY_CARE_PROVIDER_SITE_OTHER): Payer: Managed Care, Other (non HMO) | Admitting: Interventional Cardiology

## 2019-12-26 ENCOUNTER — Encounter: Payer: Self-pay | Admitting: Interventional Cardiology

## 2019-12-26 ENCOUNTER — Ambulatory Visit (INDEPENDENT_AMBULATORY_CARE_PROVIDER_SITE_OTHER): Payer: Managed Care, Other (non HMO) | Admitting: Internal Medicine

## 2019-12-26 ENCOUNTER — Other Ambulatory Visit: Payer: Self-pay

## 2019-12-26 ENCOUNTER — Encounter: Payer: Self-pay | Admitting: Internal Medicine

## 2019-12-26 VITALS — BP 132/84 | HR 85 | Ht 73.0 in | Wt 278.8 lb

## 2019-12-26 VITALS — BP 124/72 | HR 83 | Temp 98.0°F | Resp 16 | Ht 73.0 in | Wt 277.0 lb

## 2019-12-26 DIAGNOSIS — I5042 Chronic combined systolic (congestive) and diastolic (congestive) heart failure: Secondary | ICD-10-CM

## 2019-12-26 DIAGNOSIS — I4819 Other persistent atrial fibrillation: Secondary | ICD-10-CM | POA: Diagnosis not present

## 2019-12-26 DIAGNOSIS — I350 Nonrheumatic aortic (valve) stenosis: Secondary | ICD-10-CM

## 2019-12-26 DIAGNOSIS — K3189 Other diseases of stomach and duodenum: Secondary | ICD-10-CM

## 2019-12-26 DIAGNOSIS — I25118 Atherosclerotic heart disease of native coronary artery with other forms of angina pectoris: Secondary | ICD-10-CM

## 2019-12-26 DIAGNOSIS — E785 Hyperlipidemia, unspecified: Secondary | ICD-10-CM

## 2019-12-26 DIAGNOSIS — Z7901 Long term (current) use of anticoagulants: Secondary | ICD-10-CM

## 2019-12-26 DIAGNOSIS — Z7189 Other specified counseling: Secondary | ICD-10-CM

## 2019-12-26 DIAGNOSIS — E669 Obesity, unspecified: Secondary | ICD-10-CM

## 2019-12-26 DIAGNOSIS — Z0000 Encounter for general adult medical examination without abnormal findings: Secondary | ICD-10-CM | POA: Diagnosis not present

## 2019-12-26 DIAGNOSIS — I10 Essential (primary) hypertension: Secondary | ICD-10-CM

## 2019-12-26 DIAGNOSIS — K766 Portal hypertension: Secondary | ICD-10-CM

## 2019-12-26 DIAGNOSIS — E1169 Type 2 diabetes mellitus with other specified complication: Secondary | ICD-10-CM

## 2019-12-26 DIAGNOSIS — E1159 Type 2 diabetes mellitus with other circulatory complications: Secondary | ICD-10-CM

## 2019-12-26 DIAGNOSIS — E1165 Type 2 diabetes mellitus with hyperglycemia: Secondary | ICD-10-CM

## 2019-12-26 DIAGNOSIS — G4733 Obstructive sleep apnea (adult) (pediatric): Secondary | ICD-10-CM

## 2019-12-26 DIAGNOSIS — I4891 Unspecified atrial fibrillation: Secondary | ICD-10-CM

## 2019-12-26 MED ORDER — SPIRONOLACTONE 25 MG PO TABS
12.5000 mg | ORAL_TABLET | Freq: Every day | ORAL | 3 refills | Status: DC
Start: 2019-12-26 — End: 2020-12-31

## 2019-12-26 NOTE — Patient Instructions (Signed)
Medication Instructions:  1) START Spironolactone 12.5mg  once daily  *If you need a refill on your cardiac medications before your next appointment, please call your pharmacy*   Lab Work: BMET in 7-10 days  If you have labs (blood work) drawn today and your tests are completely normal, you will receive your results only by: [name] MyChart Message (if you have MyChart) OR . A paper copy in the mail If you have any lab test that is abnormal or we need to change your treatment, we will call you to review the results.   Testing/Procedures: None   Follow-Up: At [facility], you and your health needs are our priority.  As part of our continuing mission to provide you with exceptional heart care, we have created designated Provider Care Teams.  These Care Teams include your primary Cardiologist (physician) and Advanced Practice Providers (APPs -  Physician Assistants and Nurse Practitioners) who all work together to provide you with the care you need, when you need it.  We recommend signing up for the patient portal called "MyChart".  Sign up information is provided on this After Visit Summary.  MyChart is used to connect with patients for Virtual Visits (Telemedicine).  Patients are able to view lab/test results, encounter notes, upcoming appointments, etc.  Non-urgent messages can be sent to your provider as well.   To learn more about what you can do with MyChart, go to [email or web address].    Your next appointment:   3 month(s)  The format for your next appointment:   In Person  Provider:   You may see Sinclair Grooms, MD or one of the following Advanced Practice Providers on your designated Care Team:    Truitt Merle, NP  Cecilie Kicks, NP  Kathyrn Drown, NP    Other Instructions

## 2019-12-26 NOTE — Progress Notes (Signed)
Patient ID: Dalton [name], male   DOB: [birth year], 54 y.o.   MRN: [id number]   Subjective:    Patient ID: Dalton [name], male    DOB: 15-Feb-1966, 54 y.o.   MRN: [id number]  HPI This visit occurred during the SARS-CoV-2 public health emergency.  Safety protocols were in place, including screening questions prior to the visit, additional usage of staff PPE, and extensive cleaning of exam room while observing appropriate contact time as indicated for disinfecting solutions.  Patient here for his physical exam.  He saw cardiology today.  Follow up regarding bicuspid aortic valve with AS, prior SVT s/p ablation and hypertension. Being referred to aortic valve clinic to determine if consideration of AVR is indicated.  He is using cpap, but does not feel his quality of sleep is where it needs to be.  Apparently Dr Tamala Julian mentioned [facility].  No chest pain.  Feels gets sob faster than he should.  Some decreased endurance.  No acid reflux.  No abdominal pain.  Bowels moving.  Blood sugars - am - 130s-150s.  Discussed diet and exercise.  Working.       Past Medical History:  Diagnosis Date  . Aortic valve stenosis    a. Bicuspid AV - 2D Echo 06/2014 - mod AS, mild AI, mildly dilated aortic root.  [name] CAD (coronary artery disease)   . Carotid artery disease ([place])    a. Mild by duplex 05/2015 - 1-39% BICA. Repeat due 05/2017.  . Dilated aortic root ([place])    a. By echo 06/2014.  . Environmental allergies   . Hypercholesterolemia   . Hypertension   . Obesity (BMI 30-39.9) 04/26/2018  . Sleep apnea   . SVT (supraventricular tachycardia) ([place])    a. h/o SVT - Ablation done 2013.   Past Surgical History:  Procedure Laterality Date  . CARDIAC ELECTROPHYSIOLOGY STUDY AND ABLATION  08/21/2011  . COLONOSCOPY WITH PROPOFOL N/A 06/26/2017   Procedure: COLONOSCOPY WITH PROPOFOL;  Surgeon: Manya Silvas, MD;  Location: [facility] ENDOSCOPY;  Service: Endoscopy;  Laterality: N/A;  .  ESOPHAGOGASTRODUODENOSCOPY (EGD) WITH PROPOFOL N/A 06/26/2017   Procedure: ESOPHAGOGASTRODUODENOSCOPY (EGD) WITH PROPOFOL;  Surgeon: Manya Silvas, MD;  Location: [facility] ENDOSCOPY;  Service: Endoscopy;  Laterality: N/A;  . RIGHT/LEFT HEART CATH AND CORONARY ANGIOGRAPHY N/A 11/16/2019   Procedure: RIGHT/LEFT HEART CATH AND CORONARY ANGIOGRAPHY;  Surgeon: [name], MD;  Location: [facility] CV LAB;  Service: Cardiovascular;  Laterality: N/A;  . SEPTOPLASTY N/A 03/22/2015   Procedure: SEPTOPLASTY  WITH RIGHT INFERIOR TURBINATE REDUCTION;  Surgeon: Margaretha Sheffield, MD;  Location: [place];  Service: ENT;  Laterality: N/A;  . SUPRAVENTRICULAR TACHYCARDIA ABLATION N/A 08/21/2011   Procedure: SUPRAVENTRICULAR TACHYCARDIA ABLATION;  Surgeon: Evans Lance, MD;  Location: [facility] CATH LAB;  Service: Cardiovascular;  Laterality: N/A;  . surgery for non descending testicle    . TEE WITHOUT CARDIOVERSION N/A 11/16/2019   Procedure: TRANSESOPHAGEAL ECHOCARDIOGRAM (TEE);  Surgeon: Geralynn Rile, MD;  Location: [facility];  Service: Cardiovascular;  Laterality: N/A;  . TONSILLECTOMY     Family History  Problem Relation Age of Onset  . Diabetes Other   . Cancer Other   . Breast cancer Maternal Grandmother   . Cancer Maternal Grandfather   . Aortic stenosis Maternal Grandfather    Social History   Socioeconomic History  . Marital status: Married    Spouse name: Not on file  . Number of children: 2  . Years of education:  Not on file  . Highest education level: Not on file  Occupational History  . Occupation: truck Education administrator: Insurance account manager  Tobacco Use  . Smoking status: Former Smoker    Types: Cigarettes    Quit date: 11/04/1988    Years since quitting: 31.1  . Smokeless tobacco: Never Used  Vaping Use  . Vaping Use: Never used  Substance and Sexual Activity  . Alcohol use: Yes    Alcohol/week: 8.0 standard drinks    Types: 7 Glasses of wine, 1 Shots of liquor per  week    Comment: occasionally  . Drug use: Not Currently    Comment: many years ago in highschool  . Sexual activity: Not Currently  Other Topics Concern  . Not on file  Social History Narrative   Very rare exercise   Social Determinants of Health   Financial Resource Strain:   . Difficulty of Paying Living Expenses:   Food Insecurity:   . Worried About Charity fundraiser in the Last Year:   . Arboriculturist in the Last Year:   Transportation Needs:   . Film/video editor (Medical):   [name] Lack of Transportation (Non-Medical):   Physical Activity:   . Days of Exercise per Week:   . Minutes of Exercise per Session:   Stress:   . Feeling of Stress :   Social Connections:   . Frequency of Communication with Friends and Family:   . Frequency of Social Gatherings with Friends and Family:   . Attends Religious Services:   . Active Member of Clubs or Organizations:   . Attends Archivist Meetings:   [name] Marital Status:     Outpatient Encounter Medications as of 12/26/2019  Medication Sig  . acetaminophen (TYLENOL) 500 MG tablet Take 500-1,000 mg by mouth every 6 (six) hours as needed (for pain.).  [name] apixaban (ELIQUIS) 5 MG TABS tablet Take 1 tablet (5 mg total) by mouth 2 (two) times daily.  [name] atorvastatin (LIPITOR) 40 MG tablet Take 1 tablet (40 mg total) by mouth daily.  . cetirizine (ZYRTEC) 10 MG tablet Take 10 mg by mouth 2 (two) times daily.   . citalopram (CELEXA) 20 MG tablet Take 20 mg by mouth daily.  . fluticasone (FLONASE) 50 MCG/ACT nasal spray Place 2 sprays into both nostrils daily as needed for allergies or rhinitis.  [name] glucose blood (ONETOUCH VERIO) test strip USE TWICE DAILY AS DIRECTED  . Lancets (ONETOUCH DELICA PLUS NTIRWE31V) MISC USE TWICE DAILY AS DIRECTED  . lisinopril (ZESTRIL) 20 MG tablet Take 20 mg by mouth daily.  . metoprolol succinate (TOPROL-XL) 50 MG 24 hr tablet Take 1 tablet (50 mg total) by mouth daily.  . NON FORMULARY as  directed. CPAP  . spironolactone (ALDACTONE) 25 MG tablet Take 0.5 tablets (12.5 mg total) by mouth daily.  . traZODone (DESYREL) 50 MG tablet Take 50 mg by mouth at bedtime.   . [DISCONTINUED] metFORMIN (GLUCOPHAGE) 1000 MG tablet Take 1 tablet (1,000 mg total) by mouth 2 (two) times daily with a meal.   No facility-administered encounter medications on file as of 12/26/2019.    Review of Systems  Constitutional: Negative for appetite change and unexpected weight change.  HENT: Negative for congestion and sinus pressure.   Eyes: Negative for pain and visual disturbance.  Respiratory: Negative for cough and chest tightness.        Some sob with increased exertion.    Cardiovascular: Negative  for chest pain, palpitations and leg swelling.  Gastrointestinal: Negative for abdominal pain, diarrhea, nausea and vomiting.  Genitourinary: Negative for difficulty urinating and dysuria.  Musculoskeletal: Negative for joint swelling and myalgias.  Skin: Negative for color change and rash.  Neurological: Negative for dizziness, light-headedness and headaches.  Hematological: Negative for adenopathy. Does not bruise/bleed easily.  Psychiatric/Behavioral: Negative for agitation and dysphoric mood.       Objective:    Physical Exam Constitutional:      General: He is not in acute distress.    Appearance: Normal appearance. He is well-developed.  HENT:     Head: Normocephalic and atraumatic.     Right Ear: External ear normal.     Left Ear: External ear normal.  Eyes:     General:        Right eye: No discharge.        Left eye: No discharge.     Conjunctiva/sclera: Conjunctivae normal.  Neck:     Thyroid: No thyromegaly.  Cardiovascular:     Rate and Rhythm: Normal rate and regular rhythm.  Pulmonary:     Effort: No respiratory distress.     Breath sounds: Normal breath sounds. No wheezing.  Abdominal:     General: Bowel sounds are normal.     Palpations: Abdomen is soft.      Tenderness: There is no abdominal tenderness.  Musculoskeletal:        General: No swelling or tenderness.     Cervical back: Neck supple. No tenderness.  Lymphadenopathy:     Cervical: No cervical adenopathy.  Skin:    General: Skin is warm and dry.     Findings: No erythema or rash.  Neurological:     Mental Status: He is alert and oriented to person, place, and time.  Psychiatric:        Mood and Affect: Mood normal.        Behavior: Behavior normal.     BP 124/72   Pulse 83   Temp 98 F (36.7 C)   Resp 16   Ht _0  (1.854 m)   Wt 277 lb (125.6 kg)   SpO2 98%   BMI 36.55 kg/m  Wt Readings from Last 3 Encounters:  12/26/19 277 lb (125.6 kg)  12/26/19 278 lb 12.8 oz (126.5 kg)  11/25/19 276 lb (125.2 kg)     Lab Results  Component Value Date   WBC 7.5 11/11/2019   HGB 14.6 11/16/2019   HCT 43.0 11/16/2019   PLT 197 11/11/2019   GLUCOSE 108 (H) 12/26/2019   CHOL 150 12/26/2019   TRIG 157.0 (H) 12/26/2019   HDL 34.70 (L) 12/26/2019   LDLDIRECT 228.0 05/14/2018   LDLCALC 84 12/26/2019   ALT 34 12/26/2019   AST 22 12/26/2019   NA 139 12/26/2019   K 4.6 12/26/2019   CL 101 12/26/2019   CREATININE 1.01 12/26/2019   BUN 10 12/26/2019   CO2 31 12/26/2019   TSH 1.22 06/09/2019   PSA 0.72 05/14/2018   INR 1.0 08/14/2011   HGBA1C 7.2 (H) 12/26/2019   MICROALBUR 1.1 06/09/2019       Assessment & Plan:   Problem List Items Addressed This Visit    Aortic valve stenosis    Just evaluated by cardiology.  Recommended evaluation by aortic valve specialist.        Atrial fibrillation ([place])    Followed by cardiology.  On eliquis.  Stable.       Chronic combined  systolic and diastolic heart failure (HCC)    No evidence of volume overload today.  Saw cardiology.  Started on spironolactone.  Follow.       Essential hypertension    Blood pressure doing well.  Continue lisinopril and metoprolol.  To start aldactone today.  Follow metabolic panel.         Healthcare maintenance    Physical today.  Colonoscopy 06/2017 - polyps present.  Needs psa checked.  Check with next labs.       Hyperlipidemia    On lipitor.  Low cholesterol diet and exercise.  Follow lipid panel and liver function tests.        Relevant Orders   Hepatic function panel (Completed)   Lipid panel (Completed)   Obesity, diabetes, and hypertension syndrome (HCC)    Continue diet, exercise and weight loss.  Follow.        Portal hypertensive gastropathy ([place])    Found on EGD 06/2017.  No upper symptoms.  Fatty liver on ultrasound.        Sleep apnea    Uses cpap regularly.  Does not feel quality of sleep is what it should be.  Followed by Dr Radford Pax for his sleep apnea.  Dr Tamala Julian mentioned the [place].  Planning to f/u with Dr Radford Pax regarding sleep apnea.        Type 2 diabetes mellitus with hyperglycemia (HCC) - Primary    Sugars as outlined.  Discussed low carb diet and exercise.  Follow met b and a1c. On metformin.       Relevant Orders   Hemoglobin A1c (Completed)   Basic metabolic panel (Completed)       Einar Pheasant, MD

## 2019-12-26 NOTE — Addendum Note (Signed)
Addended by: Loren Racer on: 12/26/2019 11:19 AM   Modules accepted: Orders

## 2019-12-26 NOTE — Telephone Encounter (Signed)
Pt is here for appt at this time.

## 2019-12-27 LAB — BASIC METABOLIC PANEL
BUN: 10 mg/dL (ref 6–23)
CO2: 31 mEq/L (ref 19–32)
Calcium: 9.6 mg/dL (ref 8.4–10.5)
Chloride: 101 mEq/L (ref 96–112)
Creatinine, Ser: 1.01 mg/dL (ref 0.40–1.50)
GFR: 76.9 mL/min (ref >60.00)
Glucose, Bld: 108 mg/dL — ABNORMAL HIGH (ref 70–99)
Potassium: 4.6 mEq/L (ref 3.5–5.1)
Sodium: 139 mEq/L (ref 135–145)

## 2019-12-27 LAB — HEPATIC FUNCTION PANEL
ALT: 34 U/L (ref 0–53)
AST: 22 U/L (ref 0–37)
Albumin: 4.7 g/dL (ref 3.5–5.2)
Alkaline Phosphatase: 91 U/L (ref 39–117)
Bilirubin, Direct: 0.2 mg/dL (ref 0.0–0.3)
Total Bilirubin: 0.9 mg/dL (ref 0.2–1.2)
Total Protein: 6.8 g/dL (ref 6.0–8.3)

## 2019-12-27 LAB — LIPID PANEL
Cholesterol: 150 mg/dL (ref 0–200)
HDL: 34.7 mg/dL — ABNORMAL LOW (ref >39.00)
LDL Cholesterol: 84 mg/dL (ref 0–99)
NonHDL: 115.44
Total CHOL/HDL Ratio: 4
Triglycerides: 157 mg/dL — ABNORMAL HIGH (ref 0.0–149.0)
VLDL: 31.4 mg/dL (ref 0.0–40.0)

## 2019-12-27 LAB — HEMOGLOBIN A1C: Hgb A1c MFr Bld: 7.2 % — ABNORMAL HIGH (ref 4.6–6.5)

## 2019-12-28 ENCOUNTER — Other Ambulatory Visit: Payer: Self-pay | Admitting: Cardiothoracic Surgery

## 2019-12-29 ENCOUNTER — Telehealth: Payer: Self-pay | Admitting: Internal Medicine

## 2019-12-29 NOTE — Telephone Encounter (Signed)
Pt said you can her at [phone]

## 2019-12-29 NOTE — Telephone Encounter (Signed)
See result note.  

## 2019-12-30 ENCOUNTER — Other Ambulatory Visit: Payer: Self-pay | Admitting: Internal Medicine

## 2020-01-01 ENCOUNTER — Encounter: Payer: Self-pay | Admitting: Internal Medicine

## 2020-01-01 NOTE — Assessment & Plan Note (Signed)
No evidence of volume overload today.  Saw cardiology.  Started on spironolactone.  Follow.

## 2020-01-01 NOTE — Assessment & Plan Note (Signed)
Found on EGD 06/2017.  No upper symptoms.  Fatty liver on ultrasound.   °

## 2020-01-01 NOTE — Assessment & Plan Note (Addendum)
Physical today.  Colonoscopy 06/2017 - polyps present.  Needs psa checked.  Check with next labs.

## 2020-01-01 NOTE — Assessment & Plan Note (Signed)
On lipitor.  Low cholesterol diet and exercise.  Follow lipid panel and liver function tests.   

## 2020-01-01 NOTE — Assessment & Plan Note (Signed)
Blood pressure doing well.  Continue lisinopril and metoprolol.  To start aldactone today.  Follow metabolic panel.

## 2020-01-01 NOTE — Assessment & Plan Note (Signed)
Uses cpap regularly.  Does not feel quality of sleep is what it should be.  Followed by Dr Radford Pax for his sleep apnea.  Dr Tamala Julian mentioned the [place].  Planning to f/u with Dr Radford Pax regarding sleep apnea.

## 2020-01-01 NOTE — Assessment & Plan Note (Addendum)
Sugars as outlined.  Discussed low carb diet and exercise.  Follow met b and a1c. On metformin.

## 2020-01-01 NOTE — Assessment & Plan Note (Signed)
Followed by cardiology.  On eliquis.  Stable.   

## 2020-01-01 NOTE — Assessment & Plan Note (Signed)
Continue diet, exercise and weight loss.  Follow.  

## 2020-01-01 NOTE — Assessment & Plan Note (Signed)
Just evaluated by cardiology.  Recommended evaluation by aortic valve specialist.

## 2020-01-06 ENCOUNTER — Other Ambulatory Visit: Payer: Managed Care, Other (non HMO)

## 2020-01-11 ENCOUNTER — Other Ambulatory Visit: Payer: Self-pay

## 2020-01-11 ENCOUNTER — Encounter: Payer: Self-pay | Admitting: Surgery

## 2020-01-11 ENCOUNTER — Other Ambulatory Visit: Payer: Self-pay | Admitting: Surgery

## 2020-01-11 ENCOUNTER — Institutional Professional Consult (permissible substitution) (INDEPENDENT_AMBULATORY_CARE_PROVIDER_SITE_OTHER): Payer: Managed Care, Other (non HMO) | Admitting: Surgery

## 2020-01-11 VITALS — BP 111/80 | HR 100 | Resp 20 | Ht 73.0 in | Wt 275.0 lb

## 2020-01-11 DIAGNOSIS — I712 Thoracic aortic aneurysm, without rupture, unspecified: Secondary | ICD-10-CM

## 2020-01-11 DIAGNOSIS — I350 Nonrheumatic aortic (valve) stenosis: Secondary | ICD-10-CM

## 2020-01-11 NOTE — Progress Notes (Signed)
Patient ID: Dalton [name], male   DOB: 05-Nov-1965, 54 y.o.   MRN: [id number]  [facility] SURGERY CONSULTATION REPORT  Referring Provider is [name], MD Primary Cardiologist is Sinclair Grooms, MD PCP is Einar Pheasant, MD  Chief Complaint  Patient presents with  . Aortic Stenosis    Surgical eval,  Cardiac Cath  and TEE 11/16/19, ECHO 09/23/19    HPI:  The patient is a 54 year old gentleman with a history of hypertension, hyperlipidemia, type 2 DM, OSA on CPAP, history of SVT status post ablation in 2013, and bicuspid aortic valve disease with aortic stenosis.  An echocardiogram in September 2020 showed a bicuspid aortic valve with mild to moderate aortic insufficiency and a pressure half-time of 484 ms.  The mean gradient across aortic valve is 12.4 with a peak gradient of 21 and a valve area of 1.9 cm consistent with moderate aortic stenosis.  Left ventricular ejection fraction was 45 to 50%.  LV internal dimension during diastole was 6.2 cm and during systole 5.0 cm.  His most recent 2D echocardiogram on 09/23/2019 showed a mean gradient across aortic valve of 16 mmHg with a peak gradient of 27 mmHg.  Dimensionless index was 0.27 consistent with moderate aortic stenosis.  Aortic insufficiency appears mild with a pressure half-time of 512 ms.  Left ventricular ejection fraction was 40 to 45%.  LV internal dimensions were 5.6 cm in diastole and 4.3 cm in systole.  On 11/16/2019 he underwent TEE which showed a bicuspid aortic valve with fusion of the non and right coronary cusp with a single raphae.  There was severely restricted movement of the conjoined leaflets.  The left coronary cusp was moving without restriction.  The mean gradient was 19 mmHg with an aortic valve area of 1.3 cm and dimensionless index of 0.29.  Aortic valve area by planimetry was 1.48 to 1.68 cm.  There was felt to be moderate aortic  insufficiency.  Left ventricular ejection fraction was 45 to 50%.  He underwent cardiac catheterization on the same day which showed a mean gradient across aortic valve of 40 mmHg with a calculated valve area of 1.69 cm.  There was mild pulmonary hypertension at 34/18 with a mean wedge pressure of 19.  LVEDP was 21 mmHg.  There was no significant coronary disease.  He is here today with his wife.  He continues to work full-time for [employer] and has a fairly strenuous job.  He does get some shortness of breath with moderate exertion and gets fatigued but is able to stop and rest and continue on.  He denies any chest pain or pressure.  He has had no orthopnea.  He denies peripheral edema.  He has had chronic atrial fibrillation for years and is on Eliquis.  Past Medical History:  Diagnosis Date  . Aortic valve stenosis    a. Bicuspid AV - 2D Echo 06/2014 - mod AS, mild AI, mildly dilated aortic root.  [name] CAD (coronary artery disease)   . Carotid artery disease ([place])    a. Mild by duplex 05/2015 - 1-39% BICA. Repeat due 05/2017.  . Dilated aortic root ([place])    a. By echo 06/2014.  . Environmental allergies   . Hypercholesterolemia   . Hypertension   . Obesity (BMI 30-39.9) 04/26/2018  . Sleep apnea   . SVT (supraventricular tachycardia) ([place])    a. h/o SVT - Ablation done 2013.  Past Surgical History:  Procedure Laterality Date  . CARDIAC ELECTROPHYSIOLOGY STUDY AND ABLATION  08/21/2011  . COLONOSCOPY WITH PROPOFOL N/A 06/26/2017   Procedure: COLONOSCOPY WITH PROPOFOL;  Surgeon: Manya Silvas, MD;  Location: [facility] ENDOSCOPY;  Service: Endoscopy;  Laterality: N/A;  . ESOPHAGOGASTRODUODENOSCOPY (EGD) WITH PROPOFOL N/A 06/26/2017   Procedure: ESOPHAGOGASTRODUODENOSCOPY (EGD) WITH PROPOFOL;  Surgeon: Manya Silvas, MD;  Location: [facility] ENDOSCOPY;  Service: Endoscopy;  Laterality: N/A;  . RIGHT/LEFT HEART CATH AND CORONARY ANGIOGRAPHY N/A 11/16/2019   Procedure: RIGHT/LEFT HEART CATH AND  CORONARY ANGIOGRAPHY;  Surgeon: [name], MD;  Location: [facility] CV LAB;  Service: Cardiovascular;  Laterality: N/A;  . SEPTOPLASTY N/A 03/22/2015   Procedure: SEPTOPLASTY  WITH RIGHT INFERIOR TURBINATE REDUCTION;  Surgeon: Margaretha Sheffield, MD;  Location: [facility];  Service: ENT;  Laterality: N/A;  . SUPRAVENTRICULAR TACHYCARDIA ABLATION N/A 08/21/2011   Procedure: SUPRAVENTRICULAR TACHYCARDIA ABLATION;  Surgeon: Evans Lance, MD;  Location: [facility] CATH LAB;  Service: Cardiovascular;  Laterality: N/A;  . surgery for non descending testicle    . TEE WITHOUT CARDIOVERSION N/A 11/16/2019   Procedure: TRANSESOPHAGEAL ECHOCARDIOGRAM (TEE);  Surgeon: Geralynn Rile, MD;  Location: [facility];  Service: Cardiovascular;  Laterality: N/A;  . TONSILLECTOMY      Family History  Problem Relation Age of Onset  . Diabetes Other   . Cancer Other   . Breast cancer Maternal Grandmother   . Cancer Maternal Grandfather   . Aortic stenosis Maternal Grandfather     Social History   Socioeconomic History  . Marital status: Married    Spouse name: Not on file  . Number of children: 2  . Years of education: Not on file  . Highest education level: Not on file  Occupational History  . Occupation: truck Education administrator: Insurance account manager  Tobacco Use  . Smoking status: Former Smoker    Types: Cigarettes    Quit date: 11/04/1988    Years since quitting: 31.2  . Smokeless tobacco: Never Used  Vaping Use  . Vaping Use: Never used  Substance and Sexual Activity  . Alcohol use: Yes    Alcohol/week: 8.0 standard drinks    Types: 7 Glasses of wine, 1 Shots of liquor per week    Comment: occasionally  . Drug use: Not Currently    Comment: many years ago in highschool  . Sexual activity: Not Currently  Other Topics Concern  . Not on file  Social History Narrative   Very rare exercise   Social Determinants of Health   Financial Resource Strain:   . Difficulty of Paying Living  Expenses:   Food Insecurity:   . Worried About Charity fundraiser in the Last Year:   . Arboriculturist in the Last Year:   Transportation Needs:   . Film/video editor (Medical):   [name] Lack of Transportation (Non-Medical):   Physical Activity:   . Days of Exercise per Week:   . Minutes of Exercise per Session:   Stress:   . Feeling of Stress :   Social Connections:   . Frequency of Communication with Friends and Family:   . Frequency of Social Gatherings with Friends and Family:   . Attends Religious Services:   . Active Member of Clubs or Organizations:   . Attends Archivist Meetings:   [name] Marital Status:   Intimate Partner Violence:   . Fear of Current or Ex-Partner:   . Emotionally  Abused:   [name] Physically Abused:   . Sexually Abused:     Current Outpatient Medications  Medication Sig Dispense Refill  . acetaminophen (TYLENOL) 500 MG tablet Take 500-1,000 mg by mouth every 6 (six) hours as needed (for pain.).    [name] apixaban (ELIQUIS) 5 MG TABS tablet Take 1 tablet (5 mg total) by mouth 2 (two) times daily. 180 tablet 1  . atorvastatin (LIPITOR) 40 MG tablet Take 1 tablet (40 mg total) by mouth daily. 30 tablet 2  . cetirizine (ZYRTEC) 10 MG tablet Take 10 mg by mouth 2 (two) times daily.     . citalopram (CELEXA) 20 MG tablet Take 20 mg by mouth daily.    . fluticasone (FLONASE) 50 MCG/ACT nasal spray Place 2 sprays into both nostrils daily as needed for allergies or rhinitis.    [name] glucose blood (ONETOUCH VERIO) test strip USE TWICE DAILY AS DIRECTED 50 strip 31  . Lancets (ONETOUCH DELICA PLUS [id number]) MISC USE TWICE DAILY AS DIRECTED 100 each 7  . lisinopril (ZESTRIL) 20 MG tablet Take 20 mg by mouth daily.    . metFORMIN (GLUCOPHAGE) 1000 MG tablet TAKE 1 TABLET BY MOUTH TWICE A DAY 180 tablet 1  . metoprolol succinate (TOPROL-XL) 50 MG 24 hr tablet Take 1 tablet (50 mg total) by mouth daily. 30 tablet 11  . NON FORMULARY as directed. CPAP    .  spironolactone (ALDACTONE) 25 MG tablet Take 0.5 tablets (12.5 mg total) by mouth daily. 45 tablet 3  . traZODone (DESYREL) 50 MG tablet Take 50 mg by mouth at bedtime.      No current facility-administered medications for this visit.    No Known Allergies    Review of Systems:   General:  normal appetite, + decreased energy, no weight gain, no weight loss, no fever  Cardiac:  no chest pain with exertion, no chest pain at rest, +SOB with moderate exertion, no resting SOB, no PND, no orthopnea, no palpitations, + arrhythmia, + atrial fibrillation, no LE edema, no dizzy spells, no syncope  Respiratory:  + exertional shortness of breath, no home oxygen, no productive cough, no dry cough, no bronchitis, no wheezing, no hemoptysis, no asthma, no pain with inspiration or cough, + sleep apnea, + CPAP at night  GI:   no difficulty swallowing, no reflux, no frequent heartburn, no hiatal hernia, no abdominal pain, no constipation, no diarrhea, no hematochezia, no hematemesis, no melena  GU:   no dysuria,  no frequency, no urinary tract infection, no hematuria, no enlarged prostate, no kidney stones, no kidney disease  Vascular:  no pain suggestive of claudication, no pain in feet, no leg cramps, no varicose veins, no DVT, no non-healing foot ulcer  Neuro:   no stroke, no TIA's, no seizures, no headaches, no temporary blindness one eye,  no slurred speech, no peripheral neuropathy, no chronic pain, no instability of gait, no memory/cognitive dysfunction  Musculoskeletal: no arthritis, no joint swelling, no myalgias, no difficulty walking, normal mobility   Skin:   no rash, no itching, no skin infections, no pressure sores or ulcerations  Psych:   + anxiety, + PTSD, no depression, no nervousness, no unusual recent stress  Eyes:   no blurry vision, no floaters, no recent vision changes, does not wear glasses or contacts  ENT:   + hearing loss, no loose or painful teeth, no dentures, last saw dentist before  Covid. Scheduled for visit soon.  Hematologic:  no easy bruising, no abnormal  bleeding, no clotting disorder, no frequent epistaxis  Endocrine:  + diabetes, does  check CBG's at home           Physical Exam:   BP 111/80   Pulse 100   Resp 20   Ht 6\' 1"  (1.854 m)   Wt 275 lb (124.7 kg)   SpO2 95% Comment: RA  BMI 36.28 kg/m   General:  Large frame, obese by BMI,  well-appearing  HEENT:  Unremarkable, NCAT, PERLA, EOMI  Neck:   no JVD, no bruits, no adenopathy   Chest:   clear to auscultation, symmetrical breath sounds, no wheezes, no rhonchi   CV:   IRRR, grade lll/VI crescendo/decrescendo murmur heard best at RSB, 2/6  diastolic murmur left sternal border  Abdomen:  soft, non-tender, no masses   Extremities:  warm, well-perfused, pulses palpable at ankle, no LE edema  Rectal/GU  Deferred  Neuro:   Grossly non-focal and symmetrical throughout  Skin:   Clean and dry, no rashes, no breakdown   Diagnostic Tests:  ECHOCARDIOGRAM REPORT       Patient Name:  Dalton [name] Molder Date of Exam: 09/23/2019  Medical Rec #: [id number]     Height:    73.0 in  Accession #:  [id number]    Weight:    272.8 lb  Date of Birth: 01/16/1966     BSA:     2.455 m  Patient Age:  74 years     BP:      128/77 mmHg  Patient Gender: M         HR:      84 bpm.  Exam Location: [address]   Procedure: 2D Echo, Cardiac Doppler, Color Doppler and Intracardiac       Opacification Agent   Indications:  I35.0    History:    Patient has prior history of Echocardiogram examinations,  most         recent 03/25/2019. LVH, AS. Bicuspid aortic valve.,         Arrythmias:Atrial Fibrillation; Risk Factors:Former  Smoker,         Hypertension, Diabetes, Dyslipidemia and Obesity.    Sonographer:  Coralyn Helling RDCS  Referring Phys: [place]     Sonographer Comments: Patient is morbidly obese.  Image acquisition  challenging due to patient body habitus.  IMPRESSIONS    1. Compared to echo from Sept 2020, there is no significant change.  2. Poor acoustic windows limit study Consider Definity to further define.  3. Left ventricular ejection fraction, by estimation, is 40 to 45%. The  left ventricle has mildly decreased function. The left ventricle  demonstrates global hypokinesis. There is mild left ventricular  hypertrophy. Left ventricular diastolic parameters  are indeterminate.  4. Right ventricular systolic function is mildly reduced. The right  ventricular size is normal.  5. The mitral valve is abnormal. Trivial mitral valve regurgitation.  6. AV is difficult to see. Peak and mean gradients through the valve are  27 and 16 mm Hg respectively LVO/AV VTI ratio is 0.27 consistent with  moderate AS. Consider TEE to further define. .. Aortic valve regurgitation  is mild. no significant change from  prevoius echo  7. The inferior vena cava is normal in size with greater than 50%  respiratory variability, suggesting right atrial pressure of 3 mmHg.   FINDINGS  Left Ventricle: Left ventricular ejection fraction, by estimation, is 40  to 45%. The left ventricle has mildly decreased function. The left  ventricle demonstrates global hypokinesis. Definity contrast agent was  given IV to delineate the left ventricular  endocardial borders. The left ventricular internal cavity size was small.  There is mild left ventricular hypertrophy. Left ventricular diastolic  parameters are indeterminate.   Right Ventricle: The right ventricular size is normal. No increase in  right ventricular wall thickness. Right ventricular systolic function is  mildly reduced.   Left Atrium: Left atrial size was normal in size.   Right Atrium: Right atrial size was normal in size.   Pericardium: There is no evidence of pericardial effusion.   Mitral Valve: The mitral valve is abnormal.  There is mild thickening of  the mitral valve leaflet(s). Trivial mitral valve regurgitation.   Tricuspid Valve: The tricuspid valve is normal in structure. Tricuspid  valve regurgitation is trivial.   Aortic Valve: AV is difficult to see. Peak and mean gradients through the  valve are 27 and 16 mm Hg respectively LVO/AV VTI ratio is 0.27 consistent  with moderate AS. Consider TEE to further define. The aortic valve was not  well visualized. Aortic valve  regurgitation is mild. Aortic regurgitation PHT measures 512 msec. Aortic  valve mean gradient measures 14.8 mmHg. Aortic valve peak gradient  measures 24.7 mmHg.   Pulmonic Valve: The pulmonic valve was not well visualized. Pulmonic valve  regurgitation is not visualized.   Aorta: The aortic root is normal in size and structure.   Venous: The inferior vena cava is normal in size with greater than 50%  respiratory variability, suggesting right atrial pressure of 3 mmHg.   IAS/Shunts: No atrial level shunt detected by color flow Doppler.     LEFT VENTRICLE  PLAX 2D  LVIDd:     5.60 cm  LVIDs:     4.30 cm  LV PW:     1.30 cm  LV IVS:    1.30 cm     IVC  IVC diam: 1.30 cm   LEFT ATRIUM      Index    RIGHT ATRIUM      Index  LA diam:   4.00 cm 1.63 cm/m RA Pressure: 3.00 mmHg  LA Vol (A4C): 72.7 ml 29.61 ml/m RA Area:   28.60 cm                  RA Volume:  104.00 ml 42.36 ml/m  AORTIC VALVE  AV Vmax:      248.60 cm/s  AV Vmean:     178.200 cm/s  AV VTI:      0.482 m  AV Peak Grad:   24.7 mmHg  AV Mean Grad:   14.8 mmHg  LVOT Vmax:     81.96 cm/s  LVOT Vmean:    52.720 cm/s  LVOT VTI:     0.130 m  LVOT/AV VTI ratio: 0.27  AI PHT:      512 msec    AORTA  Ao Root diam: 3.60 cm  Ao Asc diam: 3.90 cm   MV E velocity: 86.98 cm/s TRICUSPID VALVE               Estimated RAP: 3.00 mmHg                  SHUNTS               Systemic VTI: 0.13 m   Dorris Carnes MD  Electronically signed by Dorris Carnes MD  Signature Date/Time: 09/24/2019/10:59:08 PM    TRANSESOPHOGEAL ECHO REPORT  Patient Name:  Dalton [name] [place]ning Date of Exam: 11/16/2019  Medical Rec #: [id number]     Height:    73.0 in  Accession #:  [id number]    Weight:    270.0 lb  Date of Birth: 08/02/1965     BSA:     2.444 m  Patient Age:  89 years     BP:      133/83 mmHg  Patient Gender: M         HR:      87 bpm.  Exam Location: Outpatient   Procedure: 3D Echo, Transesophageal Echo, Color Doppler and Cardiac  Doppler   Indications:   aortic valve disease    History:     Patient has prior history of Echocardiogram examinations,  most          recent 09/23/2019.    Sonographer:   Johny Chess  Referring Phys: [place]  Diagnosing Phys: Eleonore Chiquito MD   PROCEDURE: After discussion of the risks and benefits of a TEE, an  informed consent was obtained from the patient. TEE procedure time was 22  minutes. The transesophogeal probe was passed without difficulty through  the esophogus of the patient. Imaged  were obtained with the patient in a left lateral decubitus position. Local  oropharyngeal anesthetic was provided with Benzocaine spray. Sedation  performed by different physician. The patient was monitored while under  deep sedation. Anesthestetic sedation  was provided intravenously by Anesthesiology: 317mg  of Propofol. Image  quality was excellent. The patient's vital signs; including heart rate,  blood pressure, and oxygen saturation; remained stable throughout the  procedure. The patient developed no  complications during the procedure.   IMPRESSIONS    1. Bicuspid aortic valve with fusion of the NCC/RCC with raphe (Sievers  type 1). There is severely restricted movement of the NCC/RCC  leaflets.  The [place] moves without restriction. V max 2.8 m/s, MG 19 mmHG, AVA by VTI  1.30 cm2, DI 0.29. AVA by 3D MPR  planimetry is 1.48-1.68 cm2. AVA by planimetry will be slightly higher  than VTI method. Overall, there is moderate aortic stenosis. Regarding  regurgitation, the vena contracta is 0.40 cm by MPR assessment. 3D ERO is  0.21 cm2 consistent with moderate  aortic regurgitaiton. PHT is difficult to interpret in setting of atrial  fibrillation. In conclusion, there is combined moderate aortic stenosis  and moderate aortic regurgitation. The aortic valve is bicuspid. Aortic  valve regurgitation is moderate.  Moderate aortic valve stenosis. Aortic valve area, by VTI measures 1.30  cm. Aortic valve mean gradient measures 19.2 mmHg. Aortic valve Vmax  measures 2.84 m/s.  2. Left ventricular ejection fraction, by estimation, is 45 to 50%. Left  ventricular ejection fraction by 3D volume is 47 %. The left ventricle has  mildly decreased function. The left ventricle demonstrates global  hypokinesis.  3. Right ventricular systolic function is normal. The right ventricular  size is normal. There is normal pulmonary artery systolic pressure.  4. No left atrial/left atrial appendage thrombus was detected. The LAA  emptying velocity was 26 cm/s.  5. The mitral valve is normal in structure. Trivial mitral valve  regurgitation. No evidence of mitral stenosis.  6. There is mild (Grade II) plaque involving the descending aorta.   FINDINGS  Left Ventricle: Left ventricular ejection fraction, by estimation, is 45  to 50%. Left ventricular ejection fraction by 3D volume is 47 %. The left  ventricle has mildly decreased function.  The left ventricle demonstrates  global hypokinesis. The left  ventricular internal cavity size was normal in size. There is no left  ventricular hypertrophy.   Right Ventricle: The right ventricular size is normal. No increase in  right ventricular wall  thickness. Right ventricular systolic function is  normal. There is normal pulmonary artery systolic pressure. The tricuspid  regurgitant velocity is 1.97 m/s, and  with an assumed right atrial pressure of 0 mmHg, the estimated right  ventricular systolic pressure is 75.1 mmHg.   Left Atrium: Left atrial size was normal in size. No left atrial/left  atrial appendage thrombus was detected. The LAA emptying velocity was 26  cm/s.   Right Atrium: Right atrial size was normal in size.   Pericardium: Trivial pericardial effusion is present. The pericardial  effusion is circumferential.   Mitral Valve: The mitral valve is normal in structure. Trivial mitral  valve regurgitation. No evidence of mitral valve stenosis.   Tricuspid Valve: The tricuspid valve is normal in structure. Tricuspid  valve regurgitation is mild . No evidence of tricuspid stenosis.   Aortic Valve: Bicuspid aortic valve with fusion of the NCC/RCC with raphe  (Sievers type 1). There is severely restricted movement of the NCC/RCC  leaflets. The [place] moves without restriction. V max 2.8 m/s, MG 19 mmHG,  AVA by VTI 1.30 cm2, DI 0.29. AVA by  3D MPR planimetry is 1.48-1.68 cm2. AVA by planimetry will be slightly  higher than VTI method. Overall, there is moderate aortic stenosis.  Regarding regurgitation, the vena contracta is 0.40 cm by MPR assessment.  3D ERO is 0.21 cm2 consistent with  moderate aortic regurgitaiton. PHT is difficult to interpret in setting of  atrial fibrillation. In conclusion, there is combined moderate aortic  stenosis and moderate aortic regurgitation. The aortic valve is bicuspid.  Aortic valve regurgitation is  moderate. Aortic regurgitation PHT measures 1055 msec. Moderate aortic  stenosis is present. Aortic valve mean gradient measures 19.2 mmHg. Aortic  valve peak gradient measures 32.1 mmHg. Aortic valve area, by VTI measures  1.30 cm.   Pulmonic Valve: The pulmonic valve was normal in  structure. Pulmonic valve  regurgitation is not visualized. No evidence of pulmonic stenosis.   Aorta: The aortic root and ascending aorta are structurally normal, with  no evidence of dilitation. There is mild (Grade II) plaque involving the  descending aorta.   Venous: The left upper pulmonary vein and right upper pulmonary vein are  normal.   IAS/Shunts: No atrial level shunt detected by color flow Doppler.   EKG: Rhythm strip during this exam demostrated atrial fibrillation.     LEFT VENTRICLE  PLAX 2D  LVOT diam:   2.40 cm  LV SV:     81  LV SV Index:  33       3D Volume EF  LVOT Area:   4.52 cm    LV 3D EF:  Left                       ventricular                       ejection                       fraction by                       3D volume  is 47 %.                 LV 3D EDV:  115.54 ml                 LV 3D ESV:  60.34 ml                   3D Volume EF:                 3D EF:    47 %   AORTIC VALVE  AV Area (Vmax):  1.34 cm  AV Area (Vmean):  1.31 cm  AV Area (VTI):   1.30 cm  AV Vmax:      283.50 cm/s  AV Vmean:     206.500 cm/s  AV VTI:      0.624 m  AV Peak Grad:   32.1 mmHg  AV Mean Grad:   19.2 mmHg  LVOT Vmax:     83.82 cm/s  LVOT Vmean:    59.825 cm/s  LVOT VTI:     0.179 m  LVOT/AV VTI ratio: 0.29  AI PHT:      1055 msec    AORTA  Ao Root diam: 3.30 cm  Ao Asc diam: 3.60 cm   TRICUSPID VALVE  TR Peak grad:  15.5 mmHg  TR Vmax:    197.00 cm/s    SHUNTS  Systemic VTI: 0.18 m  Systemic Diam: 2.40 cm   Eleonore Chiquito MD  Electronically signed by Eleonore Chiquito MD  Signature Date/Time: 11/16/2019/1:00:36 PM     Physicians  Panel Physicians Referring  Physician Case Authorizing Physician  [name], MD (Primary)    Procedures  RIGHT/LEFT HEART CATH AND CORONARY ANGIOGRAPHY  Conclusion   Mild aortic stenosis with mean gradient 14 mmHg and calculated aortic valve area of 1.69 cm  Moderate aortic regurgitation documented on both transthoracic and transesophageal echocardiography.  Mild pulmonary hypertension  Elevated left ventricular end-diastolic pressure 20 mmHg consistent with chronic combined systolic and diastolic heart failure.  Echo documentation of ejection fraction in the 45% range which has gradually developed over the past 3 years.  Normal left main  Luminal irregularities in LAD  Dominant right coronary, widely patent  Nondominant right coronary.  RECOMMENDATIONS:  Does not appear that the aortic valve is severe enough to cause decreased LV function.  Suspect primary myocardial process plus/minus impact of atrial fibrillation rate control on LV function.  We will uptitrate Toprol-XL to 50 mg/day; increase lisinopril to 20 mg/day; remove HCTZ to allow blood pressure that will sustain up titration of guideline directed therapy.  Consider adding MRA if diuretic needed  Other considerations for decreased LV function could be related to sleep apnea (discussed with Dr. Radford Pax and patient's most recent download indicates excellent therapy); infiltrative process such as amyloid; other.  May ultimately need heart failure clinic consultation.  Will not need referral to the valve clinic at this time. Recommendations  Antiplatelet/Anticoag Recommend to resume Apixaban, at currently prescribed dose and frequency. Concurrent antiplatelet therapy not recommended.  Surgeon Notes    11/16/2019 10:48 AM CV Procedure signed by Geralynn Rile, MD  Indications  Chronic systolic heart failure ([place]) [I50.22 (ICD-10-CM)]  Chronic atrial fibrillation ([place]) [I48.20 (ICD-10-CM)]  Aortic stenosis with bicuspid valve [Q23.0,  Q23.1 (ICD-10-CM)]  Procedural Details  Technical Details The right radial area was sterilely prepped and draped. Intravenous sedation with Versed and fentanyl was administered. 1% Xylocaine was infiltrated to achieve  local analgesia. Using real-time vascular ultrasound, a double wall stick with an angiocath was utilized to obtain intra-arterial access. A VUS image was saved for the permanent record.The modified Seldinger technique was used to place a 12F " Slender" sheath in the right radial artery. Weight based heparin was administered. Coronary angiography was done using 5 F catheters. Right coronary angiography was performed with a JR4. Left ventricular hemodymic recordings and angiography was done using the JR 4 catheter and hand injection. Left coronary angiography was performed with a JL 3.5 cm.  Right heart catheterization was performed by exchanging a previously placed antecubital IV angio-cath for a 5 French Slender sheath. 1% Xylocaine was used to locally nesthetize the area around the IV site. The IV catheter was wired using an .018 guidewire. The modified Seldinger technique was used to place the 5 [place] sheath. Double glove technique was used to enhance sterility. After sheath insertion, right heart cath was performed using a 5 French balloon tipped catheter and fluoroscopic guidance. Pressures were recorded in each chamber and in the pulmonary capillary wedge position.. The main pulmonary artery O2 saturation was sampled.   Hemostasis was achieved using a pneumatic band.  During this procedure the patient is administered a total of Versed 0 mg and Fentanyl 0 mcg.  The patient came from the endoscopy suite where he received 2 mg of Versed, 50 mcg of fentanyl, and propofol to achieve and maintain moderate conscious sedation.  The patient still had significant sedation effect from the prior procedure.  The patient's heart rate, blood pressure, and oxygen saturation are monitored continuously  during the procedure. The period of conscious sedation is 25 minutes, of which I was present face-to-face 100% of this time. Estimated blood loss <50 mL.   During this procedure medications were administered to achieve and maintain moderate conscious sedation while the patient's heart rate, blood pressure, and oxygen saturation were continuously monitored and I was present face-to-face 100% of this time.  Medications (Filter: Administrations occurring from 1040 to 1155 on 11/16/19) (important) Continuous medications are totaled by the amount administered until 11/16/19 1155.  lidocaine (PF) (XYLOCAINE) 1 % injection (mL) Total volume:  4 mL Date/Time  Rate/Dose/Volume Action  11/16/19 1120  2 mL Given  1124  2 mL Given    Radial Cocktail/Verapamil only (mL) Total volume:  10 mL Date/Time  Rate/Dose/Volume Action  11/16/19 1126  10 mL Given    Heparin (Porcine) in NaCl 1000-0.9 UT/500ML-% SOLN (mL) Total volume:  1,000 mL Date/Time  Rate/Dose/Volume Action  11/16/19 1126  500 mL Given  1126  500 mL Given    heparin sodium (porcine) injection (Units) Total dose:  6,000 Units Date/Time  Rate/Dose/Volume Action  11/16/19 1135  6,000 Units Given    iohexol (OMNIPAQUE) 350 MG/ML injection (mL) Total volume:  50 mL Date/Time  Rate/Dose/Volume Action  11/16/19 1147  50 mL Given    Contrast  Medication Name Total Dose  iohexol (OMNIPAQUE) 350 MG/ML injection 50 mL    Radiation/Fluoro  Fluoro time: 5.4 (min) DAP: 25178 (mGycm2) Cumulative Air Kerma: 459 (mGy)  Coronary Findings  Diagnostic Dominance: Left Left Anterior Descending  Prox LAD to Mid LAD lesion is 20% stenosed.  Left Circumflex  First Obtuse Marginal Branch  Vessel is small in size.  Second Obtuse Marginal Branch  Vessel is small in size.  Third Obtuse Marginal Branch  Vessel is large in size.  Left Posterior Descending Artery  Vessel is moderate in size.  First Left Posterolateral  Branch  Vessel is  moderate in size.  Second Left Posterolateral Branch  Vessel is small in size.  Intervention  No interventions have been documented. Right Heart  Right Heart Pressures Hemodynamic findings consistent with mild pulmonary hypertension. Elevated LV EDP consistent with volume overload.  Right Atrium Right atrial pressure is elevated.  Left Heart  Left Ventricle The left ventricle is mildly dilated. There is mild left ventricular systolic dysfunction. LV end diastolic pressure is moderately elevated. The left ventricular ejection fraction is 35-45% by visual estimate. There are LV function abnormalities due to global hypokinesis. Based on Echo results.  Coronary Diagrams  Diagnostic Dominance: Left  Intervention  Implants   No implant documentation for this case.  Syngo Images  Show images for CARDIAC CATHETERIZATION Images on Long Term Storage  Show images for Berthel, Bagnall "[name]" Link to Procedure Log  Procedure Log    Hemo Data   Most Recent Value  Fick Cardiac Output 5.06 L/min  Fick Cardiac Output Index 2.06 (L/min)/BSA  RA A Wave 16 mmHg  RA V Wave 16 mmHg  RA Mean 15 mmHg  RV Systolic Pressure 36 mmHg  RV Diastolic Pressure 9 mmHg  RV EDP 15 mmHg  PA Systolic Pressure 34 mmHg  PA Diastolic Pressure 18 mmHg  PA Mean 26 mmHg  PW A Wave 23 mmHg  PW V Wave 30 mmHg  PW Mean 19 mmHg  AO Systolic Pressure 536 mmHg  AO Diastolic Pressure 94 mmHg  AO Mean 644 mmHg  LV Systolic Pressure 034 mmHg  LV Diastolic Pressure 17 mmHg  LV EDP 21 mmHg  AOp Systolic Pressure 742 mmHg  AOp Diastolic Pressure 86 mmHg  AOp Mean Pressure 595 mmHg  LVp Systolic Pressure 638 mmHg  LVp Diastolic Pressure 16 mmHg  LVp EDP Pressure 20 mmHg  QP/QS 1  TPVR Index 14.53 HRUI  TSVR Index 55.17 HRUI  PVR SVR Ratio 0.11  TPVR/TSVR Ratio 0.26    Impression:  I suspect this gentleman has symptomatic combined moderate aortic stenosis and aortic insufficiency with a bicuspid  aortic valve.  He has New York Heart Association class II symptoms of exertional fatigue and shortness of breath.  He has had a progressive decrease in his left ventricular ejection fraction since 2015 when it was 60 to 65%.  At that time he actually had a higher mean gradient across the aortic valve of 25 mmHg and normal left ventricular internal dimensions.  Since then his ejection fraction has continued to fall to 40% with increased left ventricular internal dimensions.  His diastolic dimension in September 2020 was measured at 6 cm and it was 5.6 cm by echo in March 2021.  He has decreased left ventricular systolic function may be due to the combination of aortic stenosis and insufficiency over time as well as the effects of atrial fibrillation and OSA.  I think it may be best to proceed with aortic valve replacement before his left ventricle continues to deteriorate further.  This should improve his symptoms and prevent progressive left ventricular deterioration.  The gradient across the aortic valve may be lower than expected due to his reduced ejection fraction.  He has not had his ascending aorta evaluated by CT scan and that should be done preoperatively.  It was noted to be around 4 cm on echo.  I think he may also benefit from a Maze procedure to try to keep him in sinus rhythm.  I do not think he would be a  candidate for transcatheter aortic valve replacement given his young age and bicuspid valve morphology.  I reviewed the echocardiogram images and measurements with him and his wife and my recommendation to proceed with open surgical aortic valve replacement and Maze procedure.  I discussed the operative procedure with the patient and his wife including alternatives, benefits and risks; including but not limited to bleeding, blood transfusion, infection, stroke, myocardial infarction, graft failure, heart block requiring a permanent pacemaker, organ dysfunction, and death.  He would like to go home and  think about this further.  I told him that I did not think surgery was urgent but I do not think continued following is in his best interest.  We will plan to get a CTA of the chest to evaluate his aorta since he has a bicuspid aortic valve.  Plan:  He will be scheduled for a CTA of the chest to evaluate the aorta.  He will think about surgery further and will call us to let us know if he decides to proceed.  I spent 60 minutes performing this consultation and > 50% of this time was spent face to face counseling and coordinating the care of this patient's bicuspid aortic valve with moderate aortic stenosis and moderate aortic insufficiency.   Gaye Pollack, MD 01/11/2020

## 2020-01-13 ENCOUNTER — Other Ambulatory Visit: Payer: Self-pay

## 2020-01-13 MED ORDER — GLUCOSE BLOOD VI STRP: ORAL_STRIP | 12 refills | Status: DC

## 2020-01-16 ENCOUNTER — Other Ambulatory Visit: Payer: Managed Care, Other (non HMO)

## 2020-01-17 ENCOUNTER — Other Ambulatory Visit: Payer: Managed Care, Other (non HMO)

## 2020-01-17 ENCOUNTER — Other Ambulatory Visit: Payer: Self-pay

## 2020-01-17 DIAGNOSIS — I350 Nonrheumatic aortic (valve) stenosis: Secondary | ICD-10-CM

## 2020-01-17 DIAGNOSIS — I4819 Other persistent atrial fibrillation: Secondary | ICD-10-CM

## 2020-01-17 LAB — BASIC METABOLIC PANEL
BUN/Creatinine Ratio: 12 (ref 9–20)
BUN: 13 mg/dL (ref 6–24)
CO2: 24 mmol/L (ref 20–29)
Calcium: 9.3 mg/dL (ref 8.7–10.2)
Chloride: 101 mmol/L (ref 96–106)
Creatinine, Ser: 1.06 mg/dL (ref 0.76–1.27)
GFR calc Af Amer: 92 mL/min/{1.73_m2} (ref >59)
GFR calc non Af Amer: 79 mL/min/{1.73_m2} (ref >59)
Glucose: 190 mg/dL — ABNORMAL HIGH (ref 65–99)
Potassium: 4.5 mmol/L (ref 3.5–5.2)
Sodium: 138 mmol/L (ref 134–144)

## 2020-02-02 ENCOUNTER — Other Ambulatory Visit: Payer: Self-pay

## 2020-02-02 ENCOUNTER — Ambulatory Visit
Admission: RE | Admit: 2020-02-02 | Discharge: 2020-02-02 | Disposition: A | Discharge status: Home / Self Care | Payer: Managed Care, Other (non HMO) | Source: Ambulatory Visit | Attending: Surgery | Admitting: Surgery

## 2020-02-02 DIAGNOSIS — I712 Thoracic aortic aneurysm, without rupture, unspecified: Secondary | ICD-10-CM

## 2020-02-02 MED ORDER — IOPAMIDOL (ISOVUE-370) INJECTION 76%
75.0000 mL | Freq: Once | INTRAVENOUS | Status: AC | PRN
  Administered 2020-02-02: 75 mL via INTRAVENOUS

## 2020-03-28 NOTE — Progress Notes (Signed)
Cardiology Office Note:    Date:  03/30/2020   ID:  DORYAN BAHL, DOB [birth year], MRN [id number]  PCP:  Einar Pheasant, MD  Cardiologist:  Sinclair Grooms, MD   Referring MD: Einar Pheasant, MD   Chief Complaint  Patient presents with  . Coronary Artery Disease  . Cardiac Valve Problem    History of Present Illness:    Dalton [name] is a 54 y.o. male with a hx of biscupid aortic valve with AS, prior SVT s/p RF ablation, HTN, HLD who presents for follow-up. Last echo 06/2018 EF 45%:, grade 1 DD, severely calcifiedbicuspidAV leaflets, moderate aortic stenosis withmoderateAI, mildly dilated ascending aorta.Recent echo shows increasing LV cavity size. Saw Dr. Cyndia Bent who recommended proceeding with AVR surgery.  Back for clinical follow-up.  Endorses being stable without any increase in exertional intolerance, orthopnea, palpitations, syncope, or chest pain.  Saw Dr. Cyndia Bent who recommended surgery.  The patient will need to have a Bentall procedure.  He has not informed Dr. Caffie Pinto about his intentions to move forward.  He would like to have the procedure in March 2022.  I will relay this information to Dr. Caffie Pinto.  Past Medical History:  Diagnosis Date  . Aortic valve stenosis    a. Bicuspid AV - 2D Echo 06/2014 - mod AS, mild AI, mildly dilated aortic root.  [name] CAD (coronary artery disease)   . Carotid artery disease ([place])    a. Mild by duplex 05/2015 - 1-39% BICA. Repeat due 05/2017.  . Dilated aortic root ([place])    a. By echo 06/2014.  . Environmental allergies   . Hypercholesterolemia   . Hypertension   . Obesity (BMI 30-39.9) 04/26/2018  . Sleep apnea   . SVT (supraventricular tachycardia) ([place])    a. h/o SVT - Ablation done 2013.    Past Surgical History:  Procedure Laterality Date  . CARDIAC ELECTROPHYSIOLOGY STUDY AND ABLATION  08/21/2011  . COLONOSCOPY WITH PROPOFOL N/A 06/26/2017   Procedure: COLONOSCOPY WITH PROPOFOL;  Surgeon: Manya Silvas,  MD;  Location: [facility] ENDOSCOPY;  Service: Endoscopy;  Laterality: N/A;  . ESOPHAGOGASTRODUODENOSCOPY (EGD) WITH PROPOFOL N/A 06/26/2017   Procedure: ESOPHAGOGASTRODUODENOSCOPY (EGD) WITH PROPOFOL;  Surgeon: Manya Silvas, MD;  Location: [facility] ENDOSCOPY;  Service: Endoscopy;  Laterality: N/A;  . RIGHT/LEFT HEART CATH AND CORONARY ANGIOGRAPHY N/A 11/16/2019   Procedure: RIGHT/LEFT HEART CATH AND CORONARY ANGIOGRAPHY;  Surgeon: [name], MD;  Location: [place] CV LAB;  Service: Cardiovascular;  Laterality: N/A;  . SEPTOPLASTY N/A 03/22/2015   Procedure: SEPTOPLASTY  WITH RIGHT INFERIOR TURBINATE REDUCTION;  Surgeon: Margaretha Sheffield, MD;  Location: [place];  Service: ENT;  Laterality: N/A;  . SUPRAVENTRICULAR TACHYCARDIA ABLATION N/A 08/21/2011   Procedure: SUPRAVENTRICULAR TACHYCARDIA ABLATION;  Surgeon: Evans Lance, MD;  Location[facility] CATH LAB;  Service: Cardiovascular;  Laterality: N/A;  . surgery for non descending testicle    . TEE WITHOUT CARDIOVERSION N/A 11/16/2019   Procedure: TRANSESOPHAGEAL ECHOCARDIOGRAM (TEE);  Surgeon: Geralynn Rile, MD;  Location: [place];  Service: Cardiovascular;  Laterality: N/A;  . TONSILLECTOMY      Current Medications: Current Meds  Medication Sig  . acetaminophen (TYLENOL) 500 MG tablet Take 500-1,000 mg by mouth every 6 (six) hours as needed (for pain.).  [name] apixaban (ELIQUIS) 5 MG TABS tablet Take 1 tablet (5 mg total) by mouth 2 (two) times daily.  [name] atorvastatin (LIPITOR) 40 MG tablet Take 1 tablet (40 mg total) by mouth daily.  [name]  cetirizine (ZYRTEC) 10 MG tablet Take 10 mg by mouth 2 (two) times daily.   . citalopram (CELEXA) 20 MG tablet Take 20 mg by mouth daily.  . fluticasone (FLONASE) 50 MCG/ACT nasal spray Place 2 sprays into both nostrils daily as needed for allergies or rhinitis.  [name] glucose blood test strip Use as instructed to check sugars BID. Dx e11.9  . Lancets (ONETOUCH DELICA PLUS [id number]) MISC USE TWICE  DAILY AS DIRECTED  . lisinopril (ZESTRIL) 20 MG tablet Take 20 mg by mouth daily.  . metFORMIN (GLUCOPHAGE) 1000 MG tablet TAKE 1 TABLET BY MOUTH TWICE A DAY  . metoprolol succinate (TOPROL-XL) 50 MG 24 hr tablet Take 1 tablet (50 mg total) by mouth daily.  . NON FORMULARY as directed. CPAP  . spironolactone (ALDACTONE) 25 MG tablet Take 0.5 tablets (12.5 mg total) by mouth daily.  . traZODone (DESYREL) 50 MG tablet Take 50 mg by mouth at bedtime.      Allergies:   Patient has no known allergies.   Social History   Socioeconomic History  . Marital status: Married    Spouse name: Not on file  . Number of children: 2  . Years of education: Not on file  . Highest education level: Not on file  Occupational History  . Occupation: truck Education administrator: Insurance account manager  Tobacco Use  . Smoking status: Former Smoker    Types: Cigarettes    Quit date: 11/04/1988    Years since quitting: 31.4  . Smokeless tobacco: Never Used  Vaping Use  . Vaping Use: Never used  Substance and Sexual Activity  . Alcohol use: Yes    Alcohol/week: 8.0 standard drinks    Types: 7 Glasses of wine, 1 Shots of liquor per week    Comment: occasionally  . Drug use: Not Currently    Comment: many years ago in highschool  . Sexual activity: Not Currently  Other Topics Concern  . Not on file  Social History Narrative   Very rare exercise   Social Determinants of Health   Financial Resource Strain:   . Difficulty of Paying Living Expenses: Not on file  Food Insecurity:   . Worried About Charity fundraiser in the Last Year: Not on file  . Ran Out of Food in the Last Year: Not on file  Transportation Needs:   . Lack of Transportation (Medical): Not on file  . Lack of Transportation (Non-Medical): Not on file  Physical Activity:   . Days of Exercise per Week: Not on file  . Minutes of Exercise per Session: Not on file  Stress:   . Feeling of Stress : Not on file  Social Connections:   . Frequency of  Communication with Friends and Family: Not on file  . Frequency of Social Gatherings with Friends and Family: Not on file  . Attends Religious Services: Not on file  . Active Member of Clubs or Organizations: Not on file  . Attends Archivist Meetings: Not on file  . Marital Status: Not on file     Family History: The patient's family history includes Aortic stenosis in his maternal grandfather; Breast cancer in his maternal grandmother; Cancer in his maternal grandfather and another family member; Diabetes in an other family member.  ROS:   Please see the history of present illness.    Still working.  No significant difficulty with physical activity.  All other systems reviewed and are negative.  EKGs/Labs/Other Studies Reviewed:  The following studies were reviewed today:  AORTIC CT SCAN 01/2020: IMPRESSION: 1. No evidence for a thoracic aortic aneurysm. Mild atherosclerotic disease in the thoracic aorta. Aortic Atherosclerosis (ICD10-I70.0). 2. Hepatic steatosis. 3. Thyroid tissue is heterogeneous and evidence for nodules. Largest nodule may measure up to 1.7 cm. Recommend thyroid US (ref: J Am  EKG:  EKG not repeated  Recent Labs: 06/09/2019: TSH 1.22 11/11/2019: Platelets 197 11/16/2019: Hemoglobin 14.6 12/26/2019: ALT 34 01/17/2020: BUN 13; Creatinine, Ser 1.06; Potassium 4.5; Sodium 138  Recent Lipid Panel    Component Value Date/Time   CHOL 150 12/26/2019 1526   TRIG 157.0 (H) 12/26/2019 1526   HDL 34.70 (L) 12/26/2019 1526   CHOLHDL 4 12/26/2019 1526   VLDL 31.4 12/26/2019 1526   LDLCALC 84 12/26/2019 1526   LDLDIRECT 228.0 05/14/2018 0951    Physical Exam:    VS:  BP 116/78   Pulse 71   Ht 6\' 1"  (1.854 m)   Wt 279 lb (126.6 kg)   SpO2 95%   BMI 36.81 kg/m     Wt Readings from Last 3 Encounters:  03/30/20 279 lb (126.6 kg)  01/11/20 275 lb (124.7 kg)  12/26/19 277 lb (125.6 kg)     GEN: Obese. No acute distress HEENT: Normal NECK: No  JVD. LYMPHATICS: No lymphadenopathy CARDIAC: 2/6 systolic murmur with 1/6 to 2/6 decrescendo diastolic murmur.  RRR without  gallop, or edema. VASCULAR:  Normal Pulses. No bruits. RESPIRATORY:  Clear to auscultation without rales, wheezing or rhonchi  ABDOMEN: Soft, non-tender, non-distended, No pulsatile mass, MUSCULOSKELETAL: No deformity  SKIN: Warm and dry NEUROLOGIC:  Alert and oriented x 3 PSYCHIATRIC:  Normal affect   ASSESSMENT:    1. Nonrheumatic aortic valve stenosis   2. Persistent atrial fibrillation ([place])   3. Chronic combined systolic and diastolic HF (heart failure), NYHA class 2 ([place])   4. Coronary artery disease of native artery of native heart with stable angina pectoris ([place])   5. Hyperlipidemia, unspecified hyperlipidemia type   6. Chronic anticoagulation   7. Essential hypertension   8. Obstructive sleep apnea syndrome   9. Educated about COVID-19 virus infection    PLAN:    In order of problems listed above:  1. Mixed aortic stenosis and regurgitation due to bicuspid aortic valve.  He has a dilated aortic root.  He has been seen by Dr. Cyndia Bent who has recommended surgery.  The patient has gone quiet concerning this but today tells me that he hopes to have the procedure done in March 2022.  They were concerned about making sure that insurance was in place and that there were no issues.  His insurance plan it at work is switching over late this year.  He is cautioned to inform us of any cardiac symptoms, decrease in exertional tolerance, edema, etc.  I will plan to see him back in 3 to 6 months. 2. Rate control with no complaints.  Patient states that Dr. Caffie Pinto may possibly do a Maze procedure for atrial fibrillation rhythm control. 3. He is on directed therapy for systolic dysfunction including metoprolol, Zestril, Aldactone. 4. High intensity statin therapy, atorvastatin 40 mg/day is being used. 5. See above #4. 6. Continue Eliquis 5 mg twice daily. 7. Continue  same meds as we are using for systolic dysfunction. 8. Encourage compliance with CPAP. 9. He is vaccinated.  Encouraged to practice medication.   Medication Adjustments/Labs and Tests Ordered: Current medicines are reviewed at length with the patient today.  Concerns  regarding medicines are outlined above.  No orders of the defined types were placed in this encounter.  No orders of the defined types were placed in this encounter.   Patient Instructions  Medication Instructions:  Your physician recommends that you continue on your current medications as directed. Please refer to the Current Medication list given to you today.  *If you need a refill on your cardiac medications before your next appointment, please call your pharmacy*   Lab Work: None If you have labs (blood work) drawn today and your tests are completely normal, you will receive your results only by: [name] MyChart Message (if you have MyChart) OR . A paper copy in the mail If you have any lab test that is abnormal or we need to change your treatment, we will call you to review the results.   Testing/Procedures: None   Follow-Up: At [facility], you and your health needs are our priority.  As part of our continuing mission to provide you with exceptional heart care, we have created designated Provider Care Teams.  These Care Teams include your primary Cardiologist (physician) and Advanced Practice Providers (APPs -  Physician Assistants and Nurse Practitioners) who all work together to provide you with the care you need, when you need it.  We recommend signing up for the patient portal called "MyChart".  Sign up information is provided on this After Visit Summary.  MyChart is used to connect with patients for Virtual Visits (Telemedicine).  Patients are able to view lab/test results, encounter notes, upcoming appointments, etc.  Non-urgent messages can be sent to your provider as well.   To learn more about what you can  do with MyChart, go to [email or web address].    Your next appointment:   6 month(s)  The format for your next appointment:   In Person  Provider:   You may see Sinclair Grooms, MD or one of the following Advanced Practice Providers on your designated Care Team:    Truitt Merle, NP  Cecilie Kicks, NP  Kathyrn Drown, NP    Other Instructions      Signed, Sinclair Grooms, MD  03/30/2020 11:23 AM    [place]

## 2020-03-30 ENCOUNTER — Encounter: Payer: Self-pay | Admitting: Interventional Cardiology

## 2020-03-30 ENCOUNTER — Other Ambulatory Visit: Payer: Self-pay

## 2020-03-30 ENCOUNTER — Ambulatory Visit (INDEPENDENT_AMBULATORY_CARE_PROVIDER_SITE_OTHER): Payer: Managed Care, Other (non HMO) | Admitting: Interventional Cardiology

## 2020-03-30 VITALS — BP 116/78 | HR 71 | Ht 73.0 in | Wt 279.0 lb

## 2020-03-30 DIAGNOSIS — I4819 Other persistent atrial fibrillation: Secondary | ICD-10-CM

## 2020-03-30 DIAGNOSIS — I25118 Atherosclerotic heart disease of native coronary artery with other forms of angina pectoris: Secondary | ICD-10-CM | POA: Diagnosis not present

## 2020-03-30 DIAGNOSIS — I350 Nonrheumatic aortic (valve) stenosis: Secondary | ICD-10-CM

## 2020-03-30 DIAGNOSIS — I5042 Chronic combined systolic (congestive) and diastolic (congestive) heart failure: Secondary | ICD-10-CM

## 2020-03-30 DIAGNOSIS — Z7901 Long term (current) use of anticoagulants: Secondary | ICD-10-CM

## 2020-03-30 DIAGNOSIS — I10 Essential (primary) hypertension: Secondary | ICD-10-CM

## 2020-03-30 DIAGNOSIS — E785 Hyperlipidemia, unspecified: Secondary | ICD-10-CM

## 2020-03-30 DIAGNOSIS — Z7189 Other specified counseling: Secondary | ICD-10-CM

## 2020-03-30 DIAGNOSIS — G4733 Obstructive sleep apnea (adult) (pediatric): Secondary | ICD-10-CM

## 2020-03-30 NOTE — Patient Instructions (Signed)

## 2020-04-02 ENCOUNTER — Other Ambulatory Visit: Payer: Self-pay | Admitting: *Deleted

## 2020-04-02 DIAGNOSIS — I350 Nonrheumatic aortic (valve) stenosis: Secondary | ICD-10-CM

## 2020-04-02 DIAGNOSIS — I351 Nonrheumatic aortic (valve) insufficiency: Secondary | ICD-10-CM

## 2020-04-03 ENCOUNTER — Encounter: Payer: Self-pay | Admitting: *Deleted

## 2020-04-05 ENCOUNTER — Encounter: Payer: Self-pay | Admitting: Internal Medicine

## 2020-04-05 NOTE — Telephone Encounter (Signed)
I am ok with that if he is.

## 2020-04-05 NOTE — Telephone Encounter (Signed)
He has an appt with you on 10/21, would you be agreeable for him to do a phone visit with Catie if she can see him before then and if he is agreeable to this?

## 2020-04-11 ENCOUNTER — Other Ambulatory Visit: Payer: Self-pay | Admitting: Surgery

## 2020-04-11 DIAGNOSIS — R9389 Abnormal findings on diagnostic imaging of other specified body structures: Secondary | ICD-10-CM

## 2020-04-18 ENCOUNTER — Encounter: Payer: Self-pay | Admitting: Internal Medicine

## 2020-04-18 ENCOUNTER — Encounter: Payer: Self-pay | Admitting: Surgery

## 2020-04-26 ENCOUNTER — Ambulatory Visit (INDEPENDENT_AMBULATORY_CARE_PROVIDER_SITE_OTHER): Payer: Managed Care, Other (non HMO) | Admitting: Internal Medicine

## 2020-04-26 ENCOUNTER — Other Ambulatory Visit: Payer: Self-pay

## 2020-04-26 ENCOUNTER — Ambulatory Visit
Admission: RE | Admit: 2020-04-26 | Discharge: 2020-04-26 | Disposition: A | Discharge status: Home / Self Care | Payer: Managed Care, Other (non HMO) | Source: Ambulatory Visit | Attending: Surgery | Admitting: Surgery

## 2020-04-26 ENCOUNTER — Encounter: Payer: Self-pay | Admitting: Internal Medicine

## 2020-04-26 VITALS — BP 112/76 | HR 89 | Temp 98.2°F | Resp 16 | Ht 73.0 in | Wt 279.0 lb

## 2020-04-26 DIAGNOSIS — F4310 Post-traumatic stress disorder, unspecified: Secondary | ICD-10-CM

## 2020-04-26 DIAGNOSIS — E041 Nontoxic single thyroid nodule: Secondary | ICD-10-CM

## 2020-04-26 DIAGNOSIS — I4891 Unspecified atrial fibrillation: Secondary | ICD-10-CM

## 2020-04-26 DIAGNOSIS — I10 Essential (primary) hypertension: Secondary | ICD-10-CM | POA: Diagnosis not present

## 2020-04-26 DIAGNOSIS — E1165 Type 2 diabetes mellitus with hyperglycemia: Secondary | ICD-10-CM | POA: Diagnosis not present

## 2020-04-26 DIAGNOSIS — R9389 Abnormal findings on diagnostic imaging of other specified body structures: Secondary | ICD-10-CM

## 2020-04-26 DIAGNOSIS — E1169 Type 2 diabetes mellitus with other specified complication: Secondary | ICD-10-CM

## 2020-04-26 DIAGNOSIS — E1159 Type 2 diabetes mellitus with other circulatory complications: Secondary | ICD-10-CM

## 2020-04-26 DIAGNOSIS — I5042 Chronic combined systolic (congestive) and diastolic (congestive) heart failure: Secondary | ICD-10-CM

## 2020-04-26 DIAGNOSIS — E669 Obesity, unspecified: Secondary | ICD-10-CM

## 2020-04-26 DIAGNOSIS — E785 Hyperlipidemia, unspecified: Secondary | ICD-10-CM

## 2020-04-26 DIAGNOSIS — G4733 Obstructive sleep apnea (adult) (pediatric): Secondary | ICD-10-CM | POA: Diagnosis not present

## 2020-04-26 DIAGNOSIS — I152 Hypertension secondary to endocrine disorders: Secondary | ICD-10-CM

## 2020-04-26 DIAGNOSIS — I350 Nonrheumatic aortic (valve) stenosis: Secondary | ICD-10-CM

## 2020-04-26 LAB — LIPID PANEL
Cholesterol: 137 mg/dL (ref 0–200)
HDL: 35.9 mg/dL — ABNORMAL LOW (ref >39.00)
LDL Cholesterol: 74 mg/dL (ref 0–99)
NonHDL: 101.58
Total CHOL/HDL Ratio: 4
Triglycerides: 136 mg/dL (ref 0.0–149.0)
VLDL: 27.2 mg/dL (ref 0.0–40.0)

## 2020-04-26 LAB — HEPATIC FUNCTION PANEL
ALT: 39 U/L (ref 0–53)
AST: 23 U/L (ref 0–37)
Albumin: 4.5 g/dL (ref 3.5–5.2)
Alkaline Phosphatase: 78 U/L (ref 39–117)
Bilirubin, Direct: 0.1 mg/dL (ref 0.0–0.3)
Total Bilirubin: 0.7 mg/dL (ref 0.2–1.2)
Total Protein: 6.5 g/dL (ref 6.0–8.3)

## 2020-04-26 LAB — HEMOGLOBIN A1C: Hgb A1c MFr Bld: 7.9 % — ABNORMAL HIGH (ref 4.6–6.5)

## 2020-04-26 NOTE — Progress Notes (Signed)
Patient ID: Dalton [name], male   DOB: 1966/05/02, 54 y.o.   MRN: [id number]   Subjective:    Patient ID: Dalton [name], male    DOB: 08/06/1965, 54 y.o.   MRN: [id number]  HPI This visit occurred during the SARS-CoV-2 public health emergency.  Safety protocols were in place, including screening questions prior to the visit, additional usage of staff PPE, and extensive cleaning of exam room while observing appropriate contact time as indicated for disinfecting solutions.  Patient here for a scheduled follow up.  Here to f/u regarding his diabetes, hypertension and hypercholesterolemia.  He also has a history of biscuspid aortic valve with AS, prior SVT s/p RF ablation - followed by cardiology. (mildly dilated ascending aorta noted on echo).  Recent echo revealed increasing LV cavity size.  Saw Dr Cyndia Bent who recommended AVR surgery.  Planning for surgery 09/25/19.  Has known afib.  Denies any increased heart rate or palpitations.  Continues in metoprolol, zestril and aldactone.  Discussed diet and exercise.  Low carb diet.  Blood sugars varying - 120-140-170s (occasional 200s).  Metformin - diarrhea.  No chest pain.  Breathing stable.  No abdominal pain.    Past Medical History:  Diagnosis Date  . Aortic valve stenosis    a. Bicuspid AV - 2D Echo 06/2014 - mod AS, mild AI, mildly dilated aortic root.  [name] CAD (coronary artery disease)   . Carotid artery disease ([place])    a. Mild by duplex 05/2015 - 1-39% BICA. Repeat due 05/2017.  . Dilated aortic root ([place])    a. By echo 06/2014.  . Environmental allergies   . Hypercholesterolemia   . Hypertension   . Obesity (BMI 30-39.9) 04/26/2018  . Sleep apnea   . SVT (supraventricular tachycardia) ([place])    a. h/o SVT - Ablation done 2013.   Past Surgical History:  Procedure Laterality Date  . CARDIAC ELECTROPHYSIOLOGY STUDY AND ABLATION  08/21/2011  . COLONOSCOPY WITH PROPOFOL N/A 06/26/2017   Procedure: COLONOSCOPY WITH PROPOFOL;  Surgeon:  Manya Silvas, MD;  Location[facility] ENDOSCOPY;  Service: Endoscopy;  Laterality: N/A;  . ESOPHAGOGASTRODUODENOSCOPY (EGD) WITH PROPOFOL N/A 06/26/2017   Procedure: ESOPHAGOGASTRODUODENOSCOPY (EGD) WITH PROPOFOL;  Surgeon: Manya Silvas, MD;  Location: [facility] ENDOSCOPY;  Service: Endoscopy;  Laterality: N/A;  . RIGHT/LEFT HEART CATH AND CORONARY ANGIOGRAPHY N/A 11/16/2019   Procedure: RIGHT/LEFT HEART CATH AND CORONARY ANGIOGRAPHY;  Surgeon: [name], MD;  Location: [place] CV LAB;  Service: Cardiovascular;  Laterality: N/A;  . SEPTOPLASTY N/A 03/22/2015   Procedure: SEPTOPLASTY  WITH RIGHT INFERIOR TURBINATE REDUCTION;  Surgeon: Margaretha Sheffield, MD;  Location: [place];  Service: ENT;  Laterality: N/A;  . SUPRAVENTRICULAR TACHYCARDIA ABLATION N/A 08/21/2011   Procedure: SUPRAVENTRICULAR TACHYCARDIA ABLATION;  Surgeon: Evans Lance, MD;  Location: [facility] CATH LAB;  Service: Cardiovascular;  Laterality: N/A;  . surgery for non descending testicle    . TEE WITHOUT CARDIOVERSION N/A 11/16/2019   Procedure: TRANSESOPHAGEAL ECHOCARDIOGRAM (TEE);  Surgeon: Geralynn Rile, MD;  Location: [place];  Service: Cardiovascular;  Laterality: N/A;  . TONSILLECTOMY     Family History  Problem Relation Age of Onset  . Diabetes Other   . Cancer Other   . Breast cancer Maternal Grandmother   . Cancer Maternal Grandfather   . Aortic stenosis Maternal Grandfather    Social History   Socioeconomic History  . Marital status: Married    Spouse name: Not on file  . Number of  children: 2  . Years of education: Not on file  . Highest education level: Not on file  Occupational History  . Occupation: truck Education administrator: Insurance account manager  Tobacco Use  . Smoking status: Former Smoker    Types: Cigarettes    Quit date: 11/04/1988    Years since quitting: 31.5  . Smokeless tobacco: Never Used  Vaping Use  . Vaping Use: Never used  Substance and Sexual Activity  . Alcohol use:  Yes    Alcohol/week: 8.0 standard drinks    Types: 7 Glasses of wine, 1 Shots of liquor per week    Comment: occasionally  . Drug use: Not Currently    Comment: many years ago in highschool  . Sexual activity: Not Currently  Other Topics Concern  . Not on file  Social History Narrative   Very rare exercise   Social Determinants of Health   Financial Resource Strain:   . Difficulty of Paying Living Expenses: Not on file  Food Insecurity:   . Worried About Charity fundraiser in the Last Year: Not on file  . Ran Out of Food in the Last Year: Not on file  Transportation Needs:   . Lack of Transportation (Medical): Not on file  . Lack of Transportation (Non-Medical): Not on file  Physical Activity:   . Days of Exercise per Week: Not on file  . Minutes of Exercise per Session: Not on file  Stress:   . Feeling of Stress : Not on file  Social Connections:   . Frequency of Communication with Friends and Family: Not on file  . Frequency of Social Gatherings with Friends and Family: Not on file  . Attends Religious Services: Not on file  . Active Member of Clubs or Organizations: Not on file  . Attends Archivist Meetings: Not on file  . Marital Status: Not on file    Outpatient Encounter Medications as of 04/26/2020  Medication Sig  . acetaminophen (TYLENOL) 500 MG tablet Take 500-1,000 mg by mouth every 6 (six) hours as needed (for pain.).  [name] apixaban (ELIQUIS) 5 MG TABS tablet Take 1 tablet (5 mg total) by mouth 2 (two) times daily.  [name] atorvastatin (LIPITOR) 40 MG tablet Take 1 tablet (40 mg total) by mouth daily.  . cetirizine (ZYRTEC) 10 MG tablet Take 10 mg by mouth 2 (two) times daily.   . citalopram (CELEXA) 20 MG tablet Take 20 mg by mouth daily.  . fluticasone (FLONASE) 50 MCG/ACT nasal spray Place 2 sprays into both nostrils daily as needed for allergies or rhinitis.  [name] glucose blood test strip Use as instructed to check sugars BID. Dx e11.9  . Lancets  (ONETOUCH DELICA PLUS [id number]) MISC USE TWICE DAILY AS DIRECTED  . lisinopril (ZESTRIL) 20 MG tablet Take 20 mg by mouth daily.  . metFORMIN (GLUCOPHAGE) 1000 MG tablet TAKE 1 TABLET BY MOUTH TWICE A DAY  . metoprolol succinate (TOPROL-XL) 50 MG 24 hr tablet Take 1 tablet (50 mg total) by mouth daily.  . NON FORMULARY as directed. CPAP  . spironolactone (ALDACTONE) 25 MG tablet Take 0.5 tablets (12.5 mg total) by mouth daily.  . traZODone (DESYREL) 50 MG tablet Take 50 mg by mouth at bedtime.    No facility-administered encounter medications on file as of 04/26/2020.    Review of Systems  Constitutional: Negative for appetite change and unexpected weight change.  HENT: Negative for congestion and sinus pain.   Respiratory: Negative for  cough and chest tightness.        Breathing stable.   Cardiovascular: Negative for chest pain and palpitations.  Gastrointestinal: Negative for abdominal pain, nausea and vomiting.       Loose stool as outlined.    Genitourinary: Negative for difficulty urinating and dysuria.  Musculoskeletal: Negative for joint swelling and myalgias.  Skin: Negative for color change and rash.  Neurological: Negative for dizziness, light-headedness and headaches.  Psychiatric/Behavioral: Negative for agitation and dysphoric mood.       Objective:    Physical Exam Vitals reviewed.  Constitutional:      General: He is not in acute distress.    Appearance: Normal appearance. He is well-developed.  HENT:     Head: Normocephalic and atraumatic.     Right Ear: External ear normal.     Left Ear: External ear normal.  Eyes:     General: No scleral icterus.       Right eye: No discharge.        Left eye: No discharge.     Conjunctiva/sclera: Conjunctivae normal.  Cardiovascular:     Rate and Rhythm: Normal rate and regular rhythm.  Pulmonary:     Effort: Pulmonary effort is normal. No respiratory distress.     Breath sounds: Normal breath sounds.  Abdominal:      General: Bowel sounds are normal.     Palpations: Abdomen is soft.     Tenderness: There is no abdominal tenderness.  Musculoskeletal:        General: No swelling or tenderness.     Cervical back: Neck supple. No tenderness.  Lymphadenopathy:     Cervical: No cervical adenopathy.  Skin:    Findings: No erythema or rash.  Neurological:     Mental Status: He is alert.  Psychiatric:        Mood and Affect: Mood normal.        Behavior: Behavior normal.     BP 112/76   Pulse 89   Temp 98.2 F (36.8 C) (Oral)   Resp 16   Ht _0  (1.854 m)   Wt 279 lb (126.6 kg)   SpO2 98%   BMI 36.81 kg/m  Wt Readings from Last 3 Encounters:  04/26/20 279 lb (126.6 kg)  03/30/20 279 lb (126.6 kg)  01/11/20 275 lb (124.7 kg)     Lab Results  Component Value Date   WBC 7.5 11/11/2019   HGB 14.6 11/16/2019   HCT 43.0 11/16/2019   PLT 197 11/11/2019   GLUCOSE 126 (H) 04/26/2020   CHOL 137 04/26/2020   TRIG 136.0 04/26/2020   HDL 35.90 (L) 04/26/2020   LDLDIRECT 228.0 05/14/2018   LDLCALC 74 04/26/2020   ALT 39 04/26/2020   AST 23 04/26/2020   NA 139 04/26/2020   K 4.7 04/26/2020   CL 102 04/26/2020   CREATININE 0.92 04/26/2020   BUN 12 04/26/2020   CO2 30 04/26/2020   TSH 1.22 06/09/2019   PSA 0.72 05/14/2018   INR 1.0 08/14/2011   HGBA1C 7.9 (H) 04/26/2020   MICROALBUR 1.1 06/09/2019    CT ANGIO CHEST AORTA W/CM & OR WO/CM  Result Date: 02/02/2020 CLINICAL DATA:  54 year old with a bicuspid aortic valve with aortic stenosis and aortic insufficiency. Evaluate thoracic aorta. EXAM: CT ANGIOGRAPHY CHEST TECHNIQUE: Multidetector CT imaging through the chest was performed using the standard protocol during bolus administration of intravenous contrast. Multiplanar reconstructed images and MIPs were obtained and reviewed to evaluate the vascular anatomy.  CONTRAST:  25m ISOVUE-370 IOPAMIDOL (ISOVUE-370) INJECTION 76% COMPARISON:  Chest radiograph 08/10/2018 FINDINGS: CTA CHEST  FINDINGS Cardiovascular: Calcifications at the aortic valve. Aortic root at the sinuses roughly measures 3.3 cm. Ascending thoracic aorta measures roughly 3.6 cm. Limited evaluation for aortic dissection due to pulsation artifact but no secondary signs for dissection. Typical three-vessel arch anatomy. Great vessels are patent. Proximal descending thoracic aorta measures 3.0 cm. Minimal calcifications along the aortic arch. Celiac trunk is patent. Heart size is normal. No significant pericardial fluid. Limited evaluation of the pulmonary arteries due to the phase of imaging. Mediastinum/Nodes: Thyroid tissue is heterogeneous with calcifications and suspect multiple nodules. There may be 1.7 cm left thyroid nodule. Small lymph nodes in the mediastinum. Lymph node at the AP window measures roughly 1.2 cm in the short axis. No significant chest lymphadenopathy. No significant axillary lymphadenopathy. Lungs/Pleura: Trachea and mainstem bronchi are patent. No large pleural effusions. Slight elevation of left hemidiaphragm. No focal lung consolidation. Upper abdomen: Diffuse low-density in the liver is compatible with steatosis. Musculoskeletal: Degenerative disc changes in the lower cervical spine. No acute bone abnormality. Mild curvature in the thoracic spine. Review of the MIP images confirms the above findings. IMPRESSION: 1. No evidence for a thoracic aortic aneurysm. Mild atherosclerotic disease in the thoracic aorta. Aortic Atherosclerosis (ICD10-I70.0). 2. Hepatic steatosis. 3. Thyroid tissue is heterogeneous and evidence for nodules. Largest nodule may measure up to 1.7 cm. Recommend thyroid [place](ref: J Am Coll Radiol. 2015 Feb;12(2): 143-50). Electronically Signed   By: AMarkus DaftM.D.   On: 02/02/2020 15:17       Assessment & Plan:   Problem List Items Addressed This Visit    Type 2 diabetes mellitus with hyperglycemia ([place]    Sugars varying as outlined.  Metformin - loose stool.  Discussed other  treatment options.  Check met b and a1c. Low carb diet.  Check and record sugars.  Send in.  Further adjustment in medication after review labs/sugars.        Relevant Orders   Hemoglobin A1c (Completed)   Hepatic function panel (Completed)   Lipid panel (Completed)   Thyroid nodule    Noted on CT.  Planning for f/u thyroid ultrasound.       Sleep apnea    Continue cpap.       PTSD (post-traumatic stress disorder)    Seeing Dr KNicolasa Ducking  Doing well on celexa and trazodone.  Follow.       Obesity, diabetes, and hypertension syndrome (HCC)    Continue diet, exercise and weight loss.  Follow.       Hyperlipidemia    Low cholesterol diet and exercise.  On lipitor.  Follow lipid panel and liver function tests.        Essential hypertension    Blood pressure as outlined.  Continue metoprolol, lisinopril and aldactone.  Follow pressures.  Follow metabolic panel.       Relevant Orders   Basic metabolic panel (Completed)   Chronic combined systolic and diastolic heart failure (HCC)    No evidence of volume overload on exam.  Continues on spironolactone and lisinopril.  Follow.        Atrial fibrillation ([place]    On eliquis.  Followed by cardiology.  Stable.       Aortic valve stenosis    Last ECHO and CT as outlined.  Planning for AVR surgery 09/2019. Followed by cardiology.  Charlise Giovanetti, MD 

## 2020-04-27 LAB — BASIC METABOLIC PANEL
BUN: 12 mg/dL (ref 6–23)
CO2: 30 mEq/L (ref 19–32)
Calcium: 9.4 mg/dL (ref 8.4–10.5)
Chloride: 102 mEq/L (ref 96–112)
Creatinine, Ser: 0.92 mg/dL (ref 0.40–1.50)
GFR: 99 mL/min (ref >60.00)
Glucose, Bld: 126 mg/dL — ABNORMAL HIGH (ref 70–99)
Potassium: 4.7 mEq/L (ref 3.5–5.1)
Sodium: 139 mEq/L (ref 135–145)

## 2020-04-28 ENCOUNTER — Encounter: Payer: Self-pay | Admitting: Internal Medicine

## 2020-04-28 DIAGNOSIS — F4310 Post-traumatic stress disorder, unspecified: Secondary | ICD-10-CM | POA: Insufficient documentation

## 2020-04-28 DIAGNOSIS — E041 Nontoxic single thyroid nodule: Secondary | ICD-10-CM | POA: Insufficient documentation

## 2020-04-28 NOTE — Assessment & Plan Note (Signed)
On eliquis.  Followed by cardiology.  Stable.  °

## 2020-04-28 NOTE — Assessment & Plan Note (Signed)
Last ECHO and CT as outlined.  Planning for AVR surgery 09/2019. Followed by cardiology.

## 2020-04-28 NOTE — Assessment & Plan Note (Signed)
Seeing Dr Nicolasa Ducking.  Doing well on celexa and trazodone.  Follow.

## 2020-04-28 NOTE — Assessment & Plan Note (Signed)
Continue cpap.  

## 2020-04-28 NOTE — Assessment & Plan Note (Signed)
Blood pressure as outlined.  Continue metoprolol, lisinopril and aldactone.  Follow pressures.  Follow metabolic panel.

## 2020-04-28 NOTE — Assessment & Plan Note (Signed)
Noted on CT.  Planning for f/u thyroid ultrasound.

## 2020-04-28 NOTE — Assessment & Plan Note (Signed)
Continue diet, exercise and weight loss.  Follow.  

## 2020-04-28 NOTE — Assessment & Plan Note (Signed)
No evidence of volume overload on exam.  Continues on spironolactone and lisinopril.  Follow.

## 2020-04-28 NOTE — Assessment & Plan Note (Signed)
Sugars varying as outlined.  Metformin - loose stool.  Discussed other treatment options.  Check met b and a1c. Low carb diet.  Check and record sugars.  Send in.  Further adjustment in medication after review labs/sugars.

## 2020-04-28 NOTE — Assessment & Plan Note (Signed)
Low cholesterol diet and exercise.  On lipitor.  Follow lipid panel and liver function tests.   

## 2020-05-01 ENCOUNTER — Other Ambulatory Visit: Payer: Self-pay | Admitting: Internal Medicine

## 2020-05-01 ENCOUNTER — Telehealth: Payer: Self-pay

## 2020-05-01 DIAGNOSIS — E1165 Type 2 diabetes mellitus with hyperglycemia: Secondary | ICD-10-CM

## 2020-05-01 NOTE — Chronic Care Management (AMB) (Signed)
  Care Management   Note  05/01/2020 Name: Dalton [name] MRN: [id number] DOB: 1966/06/27  [name] is a 54 y.o. year old male who is a primary care patient of Einar Pheasant, MD. I reached out to [name] by phone today in response to a referral sent by [name]'s health plan.    [name] was given information about care management services today including:  1. Care management services include personalized support from designated clinical staff supervised by his physician, including individualized plan of care and coordination with other care providers 2. 24/7 contact phone numbers for assistance for urgent and routine care needs. 3. The patient may stop care management services at any time by phone call to the office staff.  Patient agreed to services and verbal consent obtained.   Follow up plan: Telephone appointment with care management team member scheduled for:05/22/2020  Noreene Larsson, [address], [place], [place] [address] Direct Dial: [phone] [email or web address]

## 2020-05-01 NOTE — Progress Notes (Signed)
Order placed for CCM referral.

## 2020-05-03 ENCOUNTER — Other Ambulatory Visit: Payer: Self-pay | Admitting: Surgery

## 2020-05-03 DIAGNOSIS — R9389 Abnormal findings on diagnostic imaging of other specified body structures: Secondary | ICD-10-CM

## 2020-05-10 ENCOUNTER — Ambulatory Visit
Admission: RE | Admit: 2020-05-10 | Discharge: 2020-05-10 | Disposition: A | Discharge status: Home / Self Care | Payer: Managed Care, Other (non HMO) | Source: Ambulatory Visit | Attending: Surgery | Admitting: Surgery

## 2020-05-10 ENCOUNTER — Other Ambulatory Visit (HOSPITAL_COMMUNITY)
Admission: RE | Admit: 2020-05-10 | Discharge: 2020-05-10 | Disposition: A | Discharge status: Home / Self Care | Payer: Managed Care, Other (non HMO) | Source: Ambulatory Visit | Attending: Surgery | Admitting: Surgery

## 2020-05-10 DIAGNOSIS — R9389 Abnormal findings on diagnostic imaging of other specified body structures: Secondary | ICD-10-CM

## 2020-05-10 DIAGNOSIS — D34 Benign neoplasm of thyroid gland: Secondary | ICD-10-CM | POA: Diagnosis not present

## 2020-05-10 DIAGNOSIS — E041 Nontoxic single thyroid nodule: Secondary | ICD-10-CM | POA: Diagnosis present

## 2020-05-13 LAB — CYTOLOGY - NON PAP

## 2020-05-22 ENCOUNTER — Encounter: Payer: Self-pay | Admitting: Internal Medicine

## 2020-05-22 ENCOUNTER — Ambulatory Visit: Payer: Managed Care, Other (non HMO) | Admitting: Pharmacist

## 2020-05-22 DIAGNOSIS — I5042 Chronic combined systolic (congestive) and diastolic (congestive) heart failure: Secondary | ICD-10-CM

## 2020-05-22 DIAGNOSIS — E785 Hyperlipidemia, unspecified: Secondary | ICD-10-CM

## 2020-05-22 DIAGNOSIS — E1165 Type 2 diabetes mellitus with hyperglycemia: Secondary | ICD-10-CM

## 2020-05-22 DIAGNOSIS — I10 Essential (primary) hypertension: Secondary | ICD-10-CM

## 2020-05-22 MED ORDER — METFORMIN HCL ER (MOD) 1000 MG PO TB24: 1000.0000 mg | ORAL_TABLET | Freq: Two times a day (BID) | ORAL | 3 refills | Status: DC

## 2020-05-22 MED ORDER — DAPAGLIFLOZIN PROPANEDIOL 5 MG PO TABS: 5.0000 mg | ORAL_TABLET | Freq: Every day | ORAL | 2 refills | Status: DC

## 2020-05-22 NOTE — Chronic Care Management (AMB) (Signed)
Care Management   Pharmacy Note  05/22/2020 Name: Dalton [name] MRN: [id number] DOB: 09-10-1965   Subjective:  Dalton [name] is a 54 y.o. year old male who is a primary care patient of Einar Pheasant, MD. The Care Management/Care Coordination team team was consulted for assistance with care management and care coordination needs.    Engaged with patient by telephone for initial visit in response to provider referral for pharmacy case management and/or care coordination services.   Consent to Services:  Mr. [name] was given the following information about care management and care coordination services today, agreed to services, and gave verbal consent: 1.care management/care coordination services include personalized support from designated clinical staff supervised by his physician, including individualized plan of care and coordination with other care providers 2. 24/7 contact phone numbers for assistance for urgent and routine care needs. 3. The patient may stop care management/care coordination services at any time by phone call to the office staff.  Review of patient status, including review of consultants reports, laboratory and other test data, was performed as part of comprehensive evaluation and provision of chronic care management services.   SDOH (Social Determinants of Health) assessments and interventions performed:  SDOH Interventions     Most Recent Value  SDOH Interventions  Financial Strain Interventions Intervention Not Indicated       Objective:  Lab Results  Component Value Date   CREATININE 0.92 04/26/2020   CREATININE 1.06 01/17/2020   CREATININE 1.01 12/26/2019    Lab Results  Component Value Date   HGBA1C 7.9 (H) 04/26/2020       Component Value Date/Time   CHOL 137 04/26/2020 1133   TRIG 136.0 04/26/2020 1133   HDL 35.90 (L) 04/26/2020 1133   CHOLHDL 4 04/26/2020 1133   VLDL 27.2 04/26/2020 1133   [place] 74 04/26/2020 1133   LDLDIRECT  228.0 05/14/2018 0951    Clinical ASCVD: No  The 10-year ASCVD risk score [name], et al., 2013) is: 7.7%   Values used to calculate the score:     Age: 68 years     Sex: Male     Is Non-Hispanic African American: No     Diabetic: Yes     Tobacco smoker: No     Systolic Blood Pressure: 149 mmHg     Is BP treated: Yes     HDL Cholesterol: 35.9 mg/dL     Total Cholesterol: 137 mg/dL    CHA2DS2-VASc Score = 3  This indicates a 3.2% annual risk of stroke. The patient's score is based upon: CHF History: 1 HTN History: 1 Diabetes History: 1 Stroke History: 0 Vascular Disease History: 0 Age Score: 0 Gender Score: 0   BP Readings from Last 3 Encounters:  04/26/20 112/76  03/30/20 116/78  01/11/20 111/80    Assessment:   No Known Allergies  Medications Reviewed Today    Reviewed by De Hollingshead, RPH-CPP (Pharmacist) on 05/22/20 at [place] List Status: <None>  Medication Order Taking? Sig Documenting Provider Last Dose Status Informant  acetaminophen (TYLENOL) 500 MG tablet [id number]  Take 500-1,000 mg by mouth every 6 (six) hours as needed (for pain.). [provider]  Active Spouse/Significant Other  apixaban (ELIQUIS) 5 MG TABS tablet [id number] Yes Take 1 tablet (5 mg total) by mouth 2 (two) times daily. [name], MD Taking Active Spouse/Significant Other  atorvastatin (LIPITOR) 40 MG tablet [id number] Yes Take 1 tablet (40 mg total) by mouth daily. Einar Pheasant,  MD Taking Active Spouse/Significant Other  cetirizine (ZYRTEC) 10 MG tablet [id number] Yes Take 10 mg by mouth 2 (two) times daily.  [provider] Taking Active Spouse/Significant Other           Med Note [name], [name]   Tue Aug 10, 2018  8:35 PM)    citalopram (CELEXA) 40 MG tablet [id number] Yes Take 40 mg by mouth every morning. [provider] Taking Active   fluticasone (FLONASE) 50 MCG/ACT nasal spray [id number] Yes Place 2 sprays into both nostrils daily as  needed for allergies or rhinitis. [provider] Taking Active Spouse/Significant Other  glucose blood test strip [id number] Yes Use as instructed to check sugars BID. Dx e11.9 Einar Pheasant, MD Taking Active   lamoTRIgine (LAMICTAL) 25 MG tablet [id number] Yes Take 50 mg by mouth daily. [provider] Taking Active   Lancets (ONETOUCH DELICA PLUS [id number]) [name] [id number] Yes USE TWICE DAILY AS DIRECTED Einar Pheasant, MD Taking Active Spouse/Significant Other  lisinopril (ZESTRIL) 20 MG tablet [id number] Yes Take 20 mg by mouth daily. [provider] Taking Active Self  metFORMIN (GLUCOPHAGE) 1000 MG tablet [id number] Yes TAKE 1 TABLET BY MOUTH TWICE A DAY Einar Pheasant, MD Taking Active   metoprolol succinate (TOPROL-XL) 50 MG 24 hr tablet [id number] Yes Take 1 tablet (50 mg total) by mouth daily. [name], MD Taking Active Self  [name] [id number] Yes as directed. CPAP [provider] Taking Active Spouse/Significant Other  QUEtiapine (SEROQUEL) 25 MG tablet [id number] Yes Take 25 mg by mouth at bedtime. [provider] Taking Active   spironolactone (ALDACTONE) 25 MG tablet [id number] Yes Take 0.5 tablets (12.5 mg total) by mouth daily. [name], MD Taking Active           Patient Active Problem List   Diagnosis Date Noted  . PTSD (post-traumatic stress disorder) 04/28/2020  . Thyroid nodule 04/28/2020  . Healthcare maintenance 12/26/2019  . Chronic combined systolic and diastolic heart failure ([place])   . Abdominal pain 12/05/2018  . Type 2 diabetes mellitus with hyperglycemia ([place]) 12/05/2018  . Portal hypertensive gastropathy ([place]) 09/16/2018  . Abscess of left groin 07/23/2018  . Obesity, diabetes, and hypertension syndrome ([place]) 04/26/2018  . History of colon polyps 07/05/2017  . Chronic anticoagulation 01/09/2017  . Low testosterone level in male 09/21/2016  . Atrial fibrillation ([place]) 05/23/2016  . Sleep apnea  05/11/2016  . Nasal congestion 08/26/2015  . Environmental allergies 07/22/2015  . Essential hypertension 06/08/2014  . Hyperlipidemia 06/08/2014  . Aortic valve stenosis     Medication Assistance: None required. Patient affirms current coverage meets needs.   Patient Care Plan: Medication Management    Problem Identified: Diabetes, Heart Failure, Aortic Disease     Long-Range Goal: Disease Progression Prevention   This Visit's Progress: On track  Priority: High  Note:   Current Barriers:  . Unable to achieve control of diabetes   Pharmacist Clinical Goal(s):  [name] Over the next 90 days, patient will achieve control of diabetes as evidenced by improvement in A1c through collaboration with PharmD and provider.   Interventions: . Inter-disciplinary care team collaboration (see longitudinal plan of care) . Comprehensive medication review performed; medication list updated in electronic medical record  Diabetes, complicated by heart failure (last EF 45-50%) . Uncontrolled; current treatment: metformin 1000 mg BID, but does report diarrhea . Current glucose readings: fasting glucose: 140s-180s, post prandial glucose: variable . Reports hypoglycemic symptoms, but at blood glucose levels of  70-80 . Current meal patterns: Breakfast: coffee; Snacks at work: dried fruit, veggie straws, occasional cookie; fruit and vegetables; lunch: water, sweet tea; supper: doesn't eat regularly  . Educated on goal A1c, goal fasting, and goal 2 hour post prandial glucoses . Educated on dual benefit of SGLT2 on heart failure and diabetes. Patient amenable. Start Farxiga 5 mg daily. Counseled on side effects.  . Due to diarrhea w/ IR metformin, will change to ER metformin 1000 mg BID. Will use 500 mg tablet strength if 1000 mg strength is cost prohibitive.  Hypertension/Heart Failure: . Controlled; opportunity to optimized HF therapy; current treatment: lisinopril 10 mg QAM, spironolactone 12.5 mg QHS,  metoprolol succinate 50 mg QAM;  . Discussed benefit of SGLT2 in HF. See above.   Hyperlipidemia: . Uncontrolled, LDL not at goal <70 given diabetes and risk factors; current treatment: atorvastatin 40 mg daily  . Recommended to continue current regimen. Will discuss potential for increasing statin intensity at future visits  Atrial Fibrillation: . Controlled; current rate control: metoprolol succinate 50 mg daily; anticoagulant treatment: Eliquis 5 mg BID . Recommended to continue current regimen  Mental Health: [name] Patient notes good control at this time, follows w/ Dr. Nicolasa Ducking. Citalopram 40 mg daily, lamotrigine 25 mg BID (patient has not picked up from the pharmacy yet), quetiapine 25 mg QPM . Recommended to continue to follow w/ Dr. Nicolasa Ducking and recommendations at this time.  Allergies: . Controlled per patient report; cetirizine 10 mg BID, fluticasone intranasal PRN . Recommended to continue current regimen at this time  Patient Goals/Self-Care Activities . Over the next days, patient will:  - take medications as prescribed check blood glucose BID, document, and provide at future appointments  Follow Up Plan: Telephone follow up appointment with care management team member scheduled for: ~6 weeks       Plan: Telephone follow up appointment with care management team member scheduled for:  07/03/20  Catie Darnelle Maffucci, PharmD, BCACP, CPP Clinical Pharmacist [facility] [phone]

## 2020-05-22 NOTE — Patient Instructions (Signed)
Mr. Dalton [name],   It was great talking with you today!  I have sent in a prescription for extended release metformin. Please let me know if these 1000 mg tablets are not covered or are expensive, and we can switch them out for the 500 mg tablets.   We are starting Farxiga 5 mg once daily in the mornings. This medication is helpful for both diabetes and heart failure. Please go to the [place] website to download the savings card to reduce the copay. Make sure you do this for Eliquis as well, if that medication is expensive.   Call me with any questions or concerns!  Catie Darnelle Maffucci, PharmD [phone]  Visit Information  Patient Care Plan: Medication Management    Problem Identified: Diabetes, Heart Failure, Aortic Disease     Long-Range Goal: Disease Progression Prevention   This Visit's Progress: On track  Priority: High  Note:   Current Barriers:  . Unable to achieve control of diabetes   Pharmacist Clinical Goal(s):  [name] Over the next 90 days, patient will achieve control of diabetes as evidenced by improvement in A1c through collaboration with PharmD and provider.   Interventions: . Inter-disciplinary care team collaboration (see longitudinal plan of care) . Comprehensive medication review performed; medication list updated in electronic medical record  Diabetes, complicated by heart failure (last EF 45-50%) . Uncontrolled; current treatment: metformin 1000 mg BID, but does report diarrhea . Current glucose readings: fasting glucose: 140s-180s, post prandial glucose: variable . Reports hypoglycemic symptoms, but at blood glucose levels of 70-80 . Current meal patterns: Breakfast: coffee; Snacks at work: dried fruit, veggie straws, occasional cookie; fruit and vegetables; lunch: water, sweet tea; supper: doesn't eat regularly  . Educated on goal A1c, goal fasting, and goal 2 hour post prandial glucoses . Educated on dual benefit of SGLT2 on heart failure and diabetes. Patient  amenable. Start Farxiga 5 mg daily. Counseled on side effects.  . Due to diarrhea w/ IR metformin, will change to ER metformin 1000 mg BID. Will use 500 mg tablet strength if 1000 mg strength is cost prohibitive.  Hypertension/Heart Failure: . Controlled; opportunity to optimized HF therapy; current treatment: lisinopril 10 mg QAM, spironolactone 12.5 mg QHS, metoprolol succinate 50 mg QAM;  . Discussed benefit of SGLT2 in HF. See above.   Hyperlipidemia: . Uncontrolled, LDL not at goal <70 given diabetes and risk factors; current treatment: atorvastatin 40 mg daily  . Recommended to continue current regimen. Will discuss potential for increasing statin intensity at future visits  Atrial Fibrillation: . Controlled; current rate control: metoprolol succinate 50 mg daily; anticoagulant treatment: Eliquis 5 mg BID . Recommended to continue current regimen  Mental Health: [name] Patient notes good control at this time, follows w/ Dr. Nicolasa Ducking. Citalopram 40 mg daily, lamotrigine 25 mg BID (patient has not picked up from the pharmacy yet), quetiapine 25 mg QPM . Recommended to continue to follow w/ Dr. Nicolasa Ducking and recommendations at this time.  Allergies: . Controlled per patient report; cetirizine 10 mg BID, fluticasone intranasal PRN . Recommended to continue current regimen at this time  Patient Goals/Self-Care Activities . Over the next days, patient will:  - take medications as prescribed check blood glucose BID, document, and provide at future appointments  Follow Up Plan: Telephone follow up appointment with care management team member scheduled for: ~6 weeks       The patient verbalized understanding of instructions, educational materials, and care plan provided today and declined offer to receive copy of patient  instructions, educational materials, and care plan.   Patient Goals/Self-Care Activities . Over the next days, patient will:  - take medications as prescribed check blood  glucose BID, document, and provide at future appointments

## 2020-06-04 ENCOUNTER — Encounter (HOSPITAL_COMMUNITY): Payer: Self-pay

## 2020-06-11 ENCOUNTER — Other Ambulatory Visit: Payer: Self-pay

## 2020-06-11 MED ORDER — APIXABAN 5 MG PO TABS
5.0000 mg | ORAL_TABLET | Freq: Two times a day (BID) | ORAL | 3 refills | Status: DC
Start: 2020-06-11 — End: 2021-07-15

## 2020-07-04 ENCOUNTER — Ambulatory Visit: Payer: Managed Care, Other (non HMO) | Admitting: Pharmacist

## 2020-07-04 DIAGNOSIS — E1165 Type 2 diabetes mellitus with hyperglycemia: Secondary | ICD-10-CM

## 2020-07-04 DIAGNOSIS — I10 Essential (primary) hypertension: Secondary | ICD-10-CM

## 2020-07-04 DIAGNOSIS — I5042 Chronic combined systolic (congestive) and diastolic (congestive) heart failure: Secondary | ICD-10-CM

## 2020-07-04 DIAGNOSIS — I4891 Unspecified atrial fibrillation: Secondary | ICD-10-CM

## 2020-07-04 DIAGNOSIS — E785 Hyperlipidemia, unspecified: Secondary | ICD-10-CM

## 2020-07-04 MED ORDER — FREESTYLE LIBRE 2 READER DEVI: 1 refills | Status: DC

## 2020-07-04 MED ORDER — OZEMPIC (0.25 OR 0.5 MG/DOSE) 2 MG/1.5ML ~~LOC~~ SOPN: PEN_INJECTOR | SUBCUTANEOUS | 1 refills | Status: DC

## 2020-07-04 MED ORDER — FREESTYLE LIBRE 2 SENSOR MISC: 11 refills | Status: DC

## 2020-07-04 MED ORDER — METFORMIN HCL ER 500 MG PO TB24: 1000.0000 mg | ORAL_TABLET | Freq: Two times a day (BID) | ORAL | 1 refills | Status: DC

## 2020-07-04 NOTE — Patient Instructions (Signed)
Visit Information  Patient Care Plan: Medication Management    Problem Identified: Diabetes, Heart Failure, Aortic Disease     Long-Range Goal: Disease Progression Prevention   This Visit's Progress: On track  Recent Progress: On track  Priority: High  Note:   Current Barriers:  . Unable to achieve control of diabetes   Pharmacist Clinical Goal(s):  [name] Over the next 90 days, patient will achieve control of diabetes as evidenced by improvement in A1c through collaboration with PharmD and provider.   Interventions: . Inter-disciplinary care team collaboration (see longitudinal plan of care) . Comprehensive medication review performed; medication list updated in electronic medical record  Diabetes, complicated by heart failure (last EF 45-50%) . Uncontrolled; current treatment: Farxiga 5 mg daily, metformin 1000 mg BID- attempted to switch to XR 1000 mg BID, but patient notes today that his insurance did not authorize.  . Does report significant polyuria since addition of Farxiga. He is focusing on good hydration. Denies GU infection symptoms.  . Current glucose readings: fasting glucose: 150-160s, post prandial glucose: 180-230 (2 elevated readings post prandial) . Discussed CGM for greater evaluation of glucose readings throughout the day. Patient notes he may be interested if covered by insurance.  [name] Resent metformin XR for 500 mg tabs. Take 2 tab BID for 2000 mg daily total.  . Discussed next steps of increase in SGLT2 vs GLP1. Patient notes his wife has done well on Ozempic, so he would be interested in starting Ozempic. Discussed continuation of SGLT2 for HF benefit. Patient amenable. Continue Farxiga 5 mg daily, start Ozempic 0.25 mg weekly for 4 weeks then increase to 0.5 mg weekly.  . Discussed keeping a food diary to help patient evaluate patterns of hyperglycemia.   Hypertension/Heart Failure: [name] Appropriately managed; current treatment: lisinopril 10 mg QAM, spironolactone 12.5  mg QHS, metoprolol succinate 50 mg QAM, Farxiga 5 mg daily  . Continue current regimen at this time.  Hyperlipidemia: . Uncontrolled, LDL not at goal <70 given diabetes and risk factors; current treatment: atorvastatin 40 mg daily  . Recommended to continue current regimen. Will discuss potential for increasing statin intensity at future visits  Atrial Fibrillation: . Controlled; current rate control: metoprolol succinate 50 mg daily; anticoagulant treatment: Eliquis 5 mg BID . Recommended to continue current regimen  Mental Health: [name] Patient notes good control at this time, follows w/ Dr. Maryruth Bun. Citalopram 40 mg daily, lamotrigine 25 mg BID, quetiapine 25 mg QPM . Recommended to continue to follow w/ Dr. Maryruth Bun and recommendations at this time.  Allergies: . Controlled per patient report; cetirizine 10 mg BID, fluticasone intranasal PRN . Recommended to continue current regimen at this time  Patient Goals/Self-Care Activities . Over the next days, patient will:  - take medications as prescribed check blood glucose TID using CGm, document, and provide at future appointments  Follow Up Plan: Telephone follow up appointment with care management team member scheduled for: ~6 weeks     The patient verbalized understanding of instructions, educational materials, and care plan provided today and declined offer to receive copy of patient instructions, educational materials, and care plan.   Plan: Telephone follow up appointment with care management team member scheduled for: ~ 6 weeks  Catie Feliz Beam, PharmD, [place], CPP Clinical Pharmacist [employer] [facility] [employer] [phone]

## 2020-07-04 NOTE — Chronic Care Management (AMB) (Signed)
Care Management   Pharmacy Note  07/04/2020 Name: Dalton [name] MRN: [id number] DOB: 09/05/1965  Dalton [name] is a 54 y.o. year old male who is a primary care patient of [name], MD. The Care Management/Care Coordination team team was consulted for assistance with care management and care coordination needs.    Engaged with patient by telephone for follow up visit in response to provider referral for pharmacy case management and/or care coordination services.   Consent to Services:  [name] was given information about care management/care coordination services, agreed to services, and gave verbal consent prior to initiation of services on 05/22/20. Please see initial visit note for detailed documentation.   Review of patient status, including review of consultants reports, laboratory and other test data, was performed as part of comprehensive evaluation and provision of chronic care management services.   SDOH (Social Determinants of Health) assessments and interventions performed:  none  Objective:  Lab Results  Component Value Date   CREATININE 0.92 04/26/2020   CREATININE 1.06 01/17/2020   CREATININE 1.01 12/26/2019    Lab Results  Component Value Date   HGBA1C 7.9 (H) 04/26/2020       Component Value Date/Time   CHOL 137 04/26/2020 1133   TRIG 136.0 04/26/2020 1133   HDL 35.90 (L) 04/26/2020 1133   CHOLHDL 4 04/26/2020 1133   VLDL 27.2 04/26/2020 1133   LDLCALC 74 04/26/2020 1133   LDLDIRECT 228.0 05/14/2018 0951      BP Readings from Last 3 Encounters:  04/26/20 112/76  03/30/20 116/78  01/11/20 111/80    Care Plan  No Known Allergies  Medications Reviewed Today    Reviewed by [name], RPH-CPP (Pharmacist) on 05/22/20 at 0911  Med List Status: <None>  Medication Order Taking? Sig Documenting Provider Last Dose Status Informant  acetaminophen (TYLENOL) 500 MG tablet [id number]  Take 500-1,000 mg by mouth every 6 (six) hours  as needed (for pain.). [provider]  Active Spouse/Significant Other  apixaban (ELIQUIS) 5 MG TABS tablet [id number] Yes Take 1 tablet (5 mg total) by mouth 2 (two) times daily. Lyn Records, MD Taking Active Spouse/Significant Other  atorvastatin (LIPITOR) 40 MG tablet [id number] Yes Take 1 tablet (40 mg total) by mouth daily. Dale Lindsay, MD Taking Active Spouse/Significant Other  cetirizine (ZYRTEC) 10 MG tablet [id number] Yes Take 10 mg by mouth 2 (two) times daily.  [provider] Taking Active Spouse/Significant Other           Med Note Chilton Si, [name]   Tue Aug 10, 2018  8:35 PM)    citalopram (CELEXA) 40 MG tablet [id number] Yes Take 40 mg by mouth every morning. [provider] Taking Active   fluticasone (FLONASE) 50 MCG/ACT nasal spray [id number] Yes Place 2 sprays into both nostrils daily as needed for allergies or rhinitis. [provider] Taking Active Spouse/Significant Other  glucose blood test strip [id number] Yes Use as instructed to check sugars BID. Dx e11.9 [name], MD Taking Active   lamoTRIgine (LAMICTAL) 25 MG tablet [id number] Yes Take 50 mg by mouth daily. [provider] Taking Active   Lancets [name] [place]) MISC [id number] Yes USE TWICE DAILY AS DIRECTED [name], MD Taking Active Spouse/Significant Other  lisinopril (ZESTRIL) 20 MG tablet [id number] Yes Take 20 mg by mouth daily. [provider] Taking Active Self  metFORMIN (GLUCOPHAGE) 1000 MG tablet [id number] Yes TAKE 1 TABLET BY MOUTH TWICE A DAY [name], MD Taking  Active   metoprolol succinate (TOPROL-XL) 50 MG 24 hr tablet [id number] Yes Take 1 tablet (50 mg total) by mouth daily. [name], MD Taking Active Self  [name] [id number] Yes as directed. CPAP [provider] Taking Active Spouse/Significant Other  QUEtiapine (SEROQUEL) 25 MG tablet [id number] Yes Take 25 mg by mouth at bedtime. [provider] Taking Active   spironolactone (ALDACTONE) 25 MG tablet [id number] Yes Take 0.5 tablets (12.5 mg total) by mouth daily. [name], MD Taking Active           Patient Active Problem List   Diagnosis Date Noted  . PTSD (post-traumatic stress disorder) 04/28/2020  . Thyroid nodule 04/28/2020  . Healthcare maintenance 12/26/2019  . Chronic combined systolic and diastolic heart failure ([place])   . Abdominal pain 12/05/2018  . Type 2 diabetes mellitus with hyperglycemia ([place]) 12/05/2018  . Portal hypertensive gastropathy ([place]) 09/16/2018  . Abscess of left groin 07/23/2018  . Obesity, diabetes, and hypertension syndrome ([place]) 04/26/2018  . History of colon polyps 07/05/2017  . Chronic anticoagulation 01/09/2017  . Low testosterone level in male 09/21/2016  . Atrial fibrillation ([place]) 05/23/2016  . Sleep apnea 05/11/2016  . Nasal congestion 08/26/2015  . Environmental allergies 07/22/2015  . Essential hypertension 06/08/2014  . Hyperlipidemia 06/08/2014  . Aortic valve stenosis     Conditions to be addressed/monitored per PCP order: CHF, HTN, HLD and DMII  Patient Care Plan: Medication Management    Problem Identified: Diabetes, Heart Failure, Aortic Disease     Long-Range Goal: Disease Progression Prevention   This Visit's Progress: On track  Recent Progress: On track  Priority: High  Note:   Current Barriers:  . Unable to achieve control of diabetes   Pharmacist Clinical Goal(s):  [name] Over the next 90 days, patient will achieve control of diabetes as evidenced by improvement in A1c through collaboration with PharmD and provider.   Interventions: . Inter-disciplinary care team collaboration (see longitudinal plan of care) . Comprehensive medication review performed; medication list updated in electronic medical record  Diabetes, complicated by heart failure (last EF 45-50%) . Uncontrolled; current treatment: Farxiga 5 mg daily, metformin 1000 mg BID-  attempted to switch to XR 1000 mg BID, but patient notes today that his insurance did not authorize.  . Does report significant polyuria since addition of Farxiga. He is focusing on good hydration. Denies GU infection symptoms.  . Current glucose readings: fasting glucose: 150-160s, post prandial glucose: 180-230 (2 elevated readings post prandial) . Discussed CGM for greater evaluation of glucose readings throughout the day. Patient notes he may be interested if covered by insurance.  [name] Resent metformin XR for 500 mg tabs. Take 2 tab BID for 2000 mg daily total.  . Discussed next steps of increase in SGLT2 vs GLP1. Patient notes his wife has done well on Ozempic, so he would be interested in starting Ozempic. Discussed continuation of SGLT2 for HF benefit. Patient amenable. Continue Farxiga 5 mg daily, start Ozempic 0.25 mg weekly for 4 weeks then increase to 0.5 mg weekly.  . Discussed keeping a food diary to help patient evaluate patterns of hyperglycemia.   Hypertension/Heart Failure: [name] Appropriately managed; current treatment: lisinopril 10 mg QAM, spironolactone 12.5 mg QHS, metoprolol succinate 50 mg QAM, Farxiga 5 mg daily  . Continue current regimen at this time.  Hyperlipidemia: . Uncontrolled, LDL not at goal <70 given diabetes and risk factors; current treatment: atorvastatin 40 mg daily  . Recommended to continue  current regimen. Will discuss potential for increasing statin intensity at future visits  Atrial Fibrillation: . Controlled; current rate control: metoprolol succinate 50 mg daily; anticoagulant treatment: Eliquis 5 mg BID . Recommended to continue current regimen  Mental Health: [name] Patient notes good control at this time, follows w/ Dr. Nicolasa Ducking. Citalopram 40 mg daily, lamotrigine 25 mg BID, quetiapine 25 mg QPM . Recommended to continue to follow w/ Dr. Nicolasa Ducking and recommendations at this time.  Allergies: . Controlled per patient report; cetirizine 10 mg BID, fluticasone  intranasal PRN . Recommended to continue current regimen at this time  Patient Goals/Self-Care Activities . Over the next days, patient will:  - take medications as prescribed check blood glucose TID using CGm, document, and provide at future appointments  Follow Up Plan: Telephone follow up appointment with care management team member scheduled for: ~6 weeks     Medication Assistance: None required. Patient affirms current coverage meets needs.   Plan: Telephone follow up appointment with care management team member scheduled for: ~ 6 weeks  Catie Darnelle Maffucci, PharmD, [place], [place] Clinical Pharmacist [employer] [facility] [employer] [phone]

## 2020-07-06 ENCOUNTER — Encounter: Payer: Self-pay | Admitting: Internal Medicine
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
## 2020-07-13 ENCOUNTER — Encounter: Payer: Self-pay | Admitting: Psychiatry

## 2020-07-13 ENCOUNTER — Other Ambulatory Visit: Payer: Self-pay

## 2020-07-13 ENCOUNTER — Telehealth (INDEPENDENT_AMBULATORY_CARE_PROVIDER_SITE_OTHER): Payer: 59 | Admitting: Psychiatry

## 2020-07-13 DIAGNOSIS — F4310 Post-traumatic stress disorder, unspecified: Secondary | ICD-10-CM | POA: Diagnosis not present

## 2020-07-13 DIAGNOSIS — F330 Major depressive disorder, recurrent, mild: Secondary | ICD-10-CM | POA: Diagnosis not present

## 2020-07-13 MED ORDER — BUPROPION HCL ER (XL) 150 MG PO TB24: 150.0000 mg | ORAL_TABLET | Freq: Every day | ORAL | 1 refills | Status: DC

## 2020-07-13 NOTE — Patient Instructions (Signed)
1.Continue Citalopram 40 mg daily 2. Continue lamotrigine 150 mg daily 3. Continue quetiapine 25 mg at night  4. Start bupropion 150 mg daily - please discuss this with your cardiologist before starting this medication  5. Referral to therapy  6. Please contact your provider for your sleep issues/further evaluation  7. Obtain record from Dr. Nicolasa Ducking

## 2020-07-19 ENCOUNTER — Encounter: Payer: Self-pay | Admitting: Anatomic Pathology & Clinical Pathology

## 2020-07-26 ENCOUNTER — Encounter: Payer: Self-pay | Admitting: Internal Medicine

## 2020-07-27 ENCOUNTER — Ambulatory Visit (INDEPENDENT_AMBULATORY_CARE_PROVIDER_SITE_OTHER): Payer: 59 | Admitting: Licensed Clinical Social Worker

## 2020-07-27 ENCOUNTER — Other Ambulatory Visit: Payer: Self-pay

## 2020-07-27 DIAGNOSIS — F4310 Post-traumatic stress disorder, unspecified: Secondary | ICD-10-CM | POA: Diagnosis not present
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
## 2020-07-27 NOTE — Telephone Encounter (Signed)
Please confirm with pt, eating regular meals. Has he had any sensation or problems with low blood sugars.  Have him check and record some pm sugars. Also, any bowel issues with metformin.  Can decrease to 500mg  bid (instead of 1000mg ) if so and see if improved.

## 2020-07-27 NOTE — Telephone Encounter (Signed)
LMTCB

## 2020-07-31 NOTE — Telephone Encounter (Signed)
LMTCB

## 2020-08-03 ENCOUNTER — Other Ambulatory Visit: Payer: Self-pay | Admitting: Internal Medicine

## 2020-08-06 ENCOUNTER — Telehealth: Payer: Self-pay | Admitting: Licensed Clinical Social Worker

## 2020-08-06 ENCOUNTER — Other Ambulatory Visit: Payer: Self-pay | Admitting: Psychiatry

## 2020-08-06 ENCOUNTER — Ambulatory Visit (INDEPENDENT_AMBULATORY_CARE_PROVIDER_SITE_OTHER): Payer: 59 | Admitting: Licensed Clinical Social Worker

## 2020-08-06 ENCOUNTER — Other Ambulatory Visit: Payer: Self-pay

## 2020-08-06 DIAGNOSIS — F4310 Post-traumatic stress disorder, unspecified: Secondary | ICD-10-CM

## 2020-08-06 MED ORDER — LAMOTRIGINE 150 MG PO TABS: 150.0000 mg | ORAL_TABLET | Freq: Every day | ORAL | 0 refills | Status: DC

## 2020-08-06 NOTE — Progress Notes (Addendum)
Virtual Visit via Video Note  I connected with Dalton [name] on 08/06/20 at 11:00 AM EST by a video enabled telemedicine application and verified that I am speaking with the correct person using two identifiers.  Location: Patient: home Provider: [facility]   I discussed the limitations of evaluation and management by telemedicine and the availability of in person appointments. The patient expressed understanding and agreed to proceed.   I discussed the assessment and treatment plan with the patient. The patient was provided an opportunity to ask questions and all were answered. The patient agreed with the plan and demonstrated an understanding of the instructions.   The patient was advised to call back or seek an in-person evaluation if the symptoms worsen or if the condition fails to improve as anticipated.  I provided 45 minutes of non-face-to-face time during this encounter.   Dalton [name] R Dalton Bramel, LCSW    THERAPIST PROGRESS NOTE  Session Time: 11-11:45a  Participation Level: Active  Behavioral Response: Neat and Well GroomedAlertAnxious and Depressed  Type of Therapy: Individual Therapy  Treatment Goals addressed: Medication compliance; develop and implement effective coping skills to carry out normal responsibilities and participate constructively in relationships  Interventions: Solution Focused and Supportive  Summary: Dalton [name] is a 55 y.o. male who presents with continuing symptoms related to PTSD diagnosis. Pt reporting low energy, fatigue, low motivation and low initiative. Pt reports that he is compliant with medication. Pt reporting poor quality and quantity of sleep.  Allowed pt to explore and express thoughts and feeling about ongoing mood and stress. Pt feels that his mood is about the same since last session--not worse but not better. Pt reports that work and his marriage are currently triggering stress. Allowed pt to explore areas within his marriage and  work life that are triggering stress. Encouraged pt to continue focusing on self care and positive activities for himself as a tool to manage stress. Pt states that he often will sleep as much as he can on his days off work. Explained how this can be counter-productive at times.  Reviewed some couples counseling strategies to help pt with communication skills and reconnection skills. Encouraged pt to be intentional about spending time with partner and engaging in conversations enlightening to both of them.   Encouraged self care, life balance, and focusing on overall wellness.   Suicidal/Homicidal: No  Therapist Response: [name] is focusing on medication compliance and maintaining good overall physical and emotional health. [name] is working on getting 7-8 hours of restful sleep at night. [name] is continuing to explore long term/residual effects of traumatic life experiences. Current progress fluctuating/intermittent. Will re-evaluate at next session. Treatment to continue as indicated.  Plan: Return again in 3 weeks.  Diagnosis: Axis I: Post Traumatic Stress Disorder    Axis II: No diagnosis    Dalton Bo Newton Frutiger, LCSW 08/06/2020

## 2020-08-06 NOTE — Telephone Encounter (Deleted)
During counseling session, pt requested refills on Lamictal.  Jeanmarie Plant, MSW, LCSW Outpatient Therapist/Triage Specialist

## 2020-08-06 NOTE — Telephone Encounter (Signed)
Disregard

## 2020-08-13 NOTE — Progress Notes (Deleted)
[place] [facility] MD/PA/NP OP Progress Note  08/13/2020 10:56 AM Dalton [name]  MRN:  [id number]  Chief Complaint:  HPI: *** Visit Diagnosis: No diagnosis found.  Past Psychiatric History: Please see initial evaluation for full details. I have reviewed the history. No updates at this time.     Past Medical History:  Past Medical History:  Diagnosis Date  . Aortic valve stenosis    a. Bicuspid AV - 2D Echo 06/2014 - mod AS, mild AI, mildly dilated aortic root.  [name] CAD (coronary artery disease)   . Carotid artery disease ([place])    a. Mild by duplex 05/2015 - 1-39% BICA. Repeat due 05/2017.  . Dilated aortic root ([place])    a. By echo 06/2014.  . Environmental allergies   . Hypercholesterolemia   . Hypertension   . Obesity (BMI 30-39.9) 04/26/2018  . Sleep apnea   . SVT (supraventricular tachycardia) ([place])    a. h/o SVT - Ablation done 2013.    Past Surgical History:  Procedure Laterality Date  . CARDIAC ELECTROPHYSIOLOGY STUDY AND ABLATION  08/21/2011  . COLONOSCOPY WITH PROPOFOL N/A 06/26/2017   Procedure: COLONOSCOPY WITH PROPOFOL;  Surgeon: Manya Silvas, MD;  Location: [facility] ENDOSCOPY;  Service: Endoscopy;  Laterality: N/A;  . ESOPHAGOGASTRODUODENOSCOPY (EGD) WITH PROPOFOL N/A 06/26/2017   Procedure: ESOPHAGOGASTRODUODENOSCOPY (EGD) WITH PROPOFOL;  Surgeon: Manya Silvas, MD;  Location: [facility] ENDOSCOPY;  Service: Endoscopy;  Laterality: N/A;  . RIGHT/LEFT HEART CATH AND CORONARY ANGIOGRAPHY N/A 11/16/2019   Procedure: RIGHT/LEFT HEART CATH AND CORONARY ANGIOGRAPHY;  Surgeon: [name], MD;  Location: [place] CV LAB;  Service: Cardiovascular;  Laterality: N/A;  . SEPTOPLASTY N/A 03/22/2015   Procedure: SEPTOPLASTY  WITH RIGHT INFERIOR TURBINATE REDUCTION;  Surgeon: Margaretha Sheffield, MD;  Location: [place];  Service: ENT;  Laterality: N/A;  . SUPRAVENTRICULAR TACHYCARDIA ABLATION N/A 08/21/2011   Procedure: SUPRAVENTRICULAR TACHYCARDIA ABLATION;  Surgeon: Evans Lance,  MD;  Location: [facility] CATH LAB;  Service: Cardiovascular;  Laterality: N/A;  . surgery for non descending testicle    . TEE WITHOUT CARDIOVERSION N/A 11/16/2019   Procedure: TRANSESOPHAGEAL ECHOCARDIOGRAM (TEE);  Surgeon: Geralynn Rile, MD;  Location: [place];  Service: Cardiovascular;  Laterality: N/A;  . TONSILLECTOMY      Family Psychiatric History: Please see initial evaluation for full details. I have reviewed the history. No updates at this time.     Family History:  Family History  Problem Relation Age of Onset  . Diabetes Other   . Cancer Other   . Breast cancer Maternal Grandmother   . Cancer Maternal Grandfather   . Aortic stenosis Maternal Grandfather     Social History:  Social History   Socioeconomic History  . Marital status: Married    Spouse name: Not on file  . Number of children: 2  . Years of education: Not on file  . Highest education level: Not on file  Occupational History  . Occupation: truck Education administrator: Insurance account manager  Tobacco Use  . Smoking status: Former Smoker    Types: Cigarettes    Quit date: 11/04/1988    Years since quitting: 31.7  . Smokeless tobacco: Never Used  Vaping Use  . Vaping Use: Never used  Substance and Sexual Activity  . Alcohol use: Yes    Alcohol/week: 8.0 standard drinks    Types: 7 Glasses of wine, 1 Shots of liquor per week    Comment: occasionally  . Drug use: Not Currently  Comment: many years ago in highschool  . Sexual activity: Not Currently  Other Topics Concern  . Not on file  Social History Narrative   Very rare exercise   Social Determinants of Health   Financial Resource Strain: Low Risk   . Difficulty of Paying Living Expenses: Not hard at all  Food Insecurity: Not on file  Transportation Needs: Not on file  Physical Activity: Not on file  Stress: Not on file  Social Connections: Not on file    Allergies: No Known Allergies  Metabolic Disorder Labs: Lab Results  Component  Value Date   HGBA1C 7.9 (H) 04/26/2020   No results found for: PROLACTIN Lab Results  Component Value Date   CHOL 137 04/26/2020   TRIG 136.0 04/26/2020   HDL 35.90 (L) 04/26/2020   CHOLHDL 4 04/26/2020   VLDL 27.2 04/26/2020   LDLCALC 74 04/26/2020   LDLCALC 84 12/26/2019   Lab Results  Component Value Date   TSH 1.22 06/09/2019   TSH 1.56 05/14/2018    Therapeutic Level Labs: No results found for: LITHIUM No results found for: VALPROATE No components found for:  CBMZ  Current Medications: Current Outpatient Medications  Medication Sig Dispense Refill  . acetaminophen (TYLENOL) 500 MG tablet Take 500-1,000 mg by mouth every 6 (six) hours as needed (for pain.).    [name] apixaban (ELIQUIS) 5 MG TABS tablet Take 1 tablet (5 mg total) by mouth 2 (two) times daily. 180 tablet 3  . atorvastatin (LIPITOR) 40 MG tablet Take 1 tablet (40 mg total) by mouth daily. 30 tablet 2  . buPROPion (WELLBUTRIN XL) 150 MG 24 hr tablet Take 1 tablet (150 mg total) by mouth daily. 30 tablet 1  . cetirizine (ZYRTEC) 10 MG tablet Take 10 mg by mouth 2 (two) times daily.     . citalopram (CELEXA) 40 MG tablet Take 40 mg by mouth every morning.    . Continuous Blood Gluc Receiver (FREESTYLE LIBRE 2 READER) DEVI Use to check glucose at least TID 1 each 1  . Continuous Blood Gluc Sensor (FREESTYLE LIBRE 2 SENSOR) MISC Use to check glucose at least Q8H 2 each 11  . dapagliflozin propanediol (FARXIGA) 5 MG TABS tablet Take 1 tablet (5 mg total) by mouth daily before breakfast. 30 tablet 2  . fluticasone (FLONASE) 50 MCG/ACT nasal spray Place 2 sprays into both nostrils daily as needed for allergies or rhinitis.    [name] glucose blood test strip Use as instructed to check sugars BID. Dx e11.9 100 each 12  . lamoTRIgine (LAMICTAL) 150 MG tablet Take 1 tablet (150 mg total) by mouth daily. 30 tablet 0  . lamoTRIgine (LAMICTAL) 25 MG tablet Take 50 mg by mouth daily.    . Lancets (ONETOUCH DELICA PLUS 123XX123) MISC  USE TWICE DAILY AS DIRECTED 100 each 7  . lisinopril (ZESTRIL) 20 MG tablet Take 20 mg by mouth daily.    . metFORMIN (GLUCOPHAGE XR) 500 MG 24 hr tablet Take 2 tablets (1,000 mg total) by mouth 2 (two) times daily. 360 tablet 1  . metFORMIN (GLUCOPHAGE) 1000 MG tablet TAKE 1 TABLET BY MOUTH TWICE DAILY 120 tablet 1  . metoprolol succinate (TOPROL-XL) 50 MG 24 hr tablet Take 1 tablet (50 mg total) by mouth daily. 30 tablet 11  . NON FORMULARY as directed. CPAP    . QUEtiapine (SEROQUEL) 25 MG tablet Take 25 mg by mouth at bedtime.    . Semaglutide,0.25 or 0.5MG /DOS, (OZEMPIC, 0.25 OR 0.5 MG/DOSE,)  2 MG/1.5ML SOPN Inject 0.25 mg weekly for 4 weeks then increase to 0.5 mg weekly 4.5 mL 1  . spironolactone (ALDACTONE) 25 MG tablet Take 0.5 tablets (12.5 mg total) by mouth daily. 45 tablet 3   No current facility-administered medications for this visit.     Musculoskeletal: Strength & Muscle Tone: N/A Gait & Station: N/A Patient leans: N/A  Psychiatric Specialty Exam: Review of Systems  There were no vitals taken for this visit.There is no height or weight on file to calculate BMI.  General Appearance: {Appearance:22683}  Eye Contact:  {BHH EYE CONTACT:22684}  Speech:  Clear and Coherent  Volume:  Normal  Mood:  {BHH MOOD:22306}  Affect:  {Affect (PAA):22687}  Thought Process:  Coherent  Orientation:  Full (Time, Place, and Person)  Thought Content: Logical   Suicidal Thoughts:  {ST/HT (PAA):22692}  Homicidal Thoughts:  {ST/HT (PAA):22692}  Memory:  Immediate;   Good  Judgement:  {Judgement (PAA):22694}  Insight:  {Insight (PAA):22695}  Psychomotor Activity:  Normal  Concentration:  Concentration: Good and Attention Span: Good  Recall:  Good  Fund of Knowledge: Good  Language: Good  Akathisia:  No  Handed:  Right  AIMS (if indicated): not done  Assets:  Communication Skills Desire for Improvement  ADL's:  Intact  Cognition: WNL  Sleep:  {BHH GOOD/FAIR/POOR:22877}    Screenings: GAD-7   Health and safety inspector from 07/27/2020 in [facility]  Total GAD-7 Score 13    PHQ2-9   [place] from 07/27/2020 in [place] Office Visit from 03/29/2019 in [facility] Office Visit from 07/05/2018 in [facility] from 06/20/2016 in [facility] Office Visit from 05/05/2016 in [facility]  PHQ-2 Total Score 3 0 0 1 0  PHQ-9 Total Score 12 0 4 -- --       Assessment and Plan:  Dalton [name] is a 55 y.o. year old male with a history of PTSD, depression, anxiety, insomnia, unspecified mood disorder, mixed aortic stenosis and regurgitation secondary to bicuspid AV, chronic heart failure, A fib on eliquis,hypertension, type II DM,  cervical radiculopathy, degeneration of intervertebral disk, sleep apnea,, who presents for follow up appointment for below.   1. PTSD (post-traumatic stress disorder) 2. Mild episode of recurrent major depressive disorder ([place]) He reports symptoms of PTSD, depression with prominent irritability and fatigue over the past several months.  Psychosocial stressors include work, history in [employer], and being denied of service connection by [place]. we will start bupropion to target depression/fatigue.  We will continue citalopram to target PTSD and depression.  We will continue lamotrigine at this time to target irritability given patient reports good benefit from.  Discussed potential risk of Stevens-Johnson syndrome.  We will continue quetiapine adjunctive treatment for PTSD and depression.  Discussed potential risk of drowsiness and metabolic side effect.  Noted that he does have drowsiness from this medication, although he wants to stay on this as it has been helpful for insomnia; we monitor side effect.  Also he may benefit from prazosin in the future, we will hold it at this time due to his  cardiac condition. He will greatly benefit from CBT; will make a referral. He is also recommended to contact [facility] if he would be eligible for peer support group.   # Parasomnia He reports a history of parasomnia.  Per chart review, he did have a home sleep study in last year, and was recommended  for further evaluation.  He is advised to contact his provider.   Plan  1.Continue Citalopram 40 mg daily 2. Continue lamotrigine 150 mg daily 3. Continue quetiapine 25 mg at night - monitor drowsiness 4. Start bupropion 150 mg daily - please discuss this with your cardiologist before starting this medication  5. Referral to therapy  6. Please contact your provider for your sleep issues/further evaluation  7. Obtain record from Dr. Nicolasa Ducking  The patient demonstrates the following risk factors for suicide: Chronic risk factors for suicide include: psychiatric disorder of depression, PTSD. Acute risk factors for suicide include: N/A. Protective factors for this patient include: positive social support, responsibility to others (children, family), coping skills and hope for the future. Considering these factors, the overall suicide risk at this point appears to be low. Patient is appropriate for outpatient follow up.    [name], MD 08/13/2020, 10:56 AM

## 2020-08-15 ENCOUNTER — Other Ambulatory Visit: Payer: Self-pay | Admitting: *Deleted

## 2020-08-15 ENCOUNTER — Telehealth: Payer: Managed Care, Other (non HMO)

## 2020-08-15 ENCOUNTER — Telehealth: Payer: Self-pay | Admitting: Pharmacist

## 2020-08-15 DIAGNOSIS — I350 Nonrheumatic aortic (valve) stenosis: Secondary | ICD-10-CM

## 2020-08-15 DIAGNOSIS — I351 Nonrheumatic aortic (valve) insufficiency: Secondary | ICD-10-CM

## 2020-08-15 NOTE — Telephone Encounter (Signed)
  Chronic Care Management   Note  08/15/2020 Name: [name] MRN: [id number] DOB: January 20, 1966   Attempted to contact patient for scheduled appointment for medication management support. Left HIPAA compliant message for patient to return my call at their convenience.    Plan: - If I do not hear back from the patient by end of business today, will collaborate with Care Guide to outreach to schedule follow up with me  Catie Darnelle Maffucci, PharmD, [place], [place] Pharmacist [employer] at [employer] [phone]

## 2020-08-17 ENCOUNTER — Other Ambulatory Visit: Payer: Self-pay

## 2020-08-17 ENCOUNTER — Telehealth: Payer: Self-pay

## 2020-08-17 ENCOUNTER — Telehealth: Payer: Self-pay | Admitting: Psychiatry

## 2020-08-17 ENCOUNTER — Telehealth: Payer: 59 | Admitting: Psychiatry

## 2020-08-17 NOTE — Chronic Care Management (AMB) (Signed)
  Care Management   Note  08/17/2020 Name: Dalton [name] MRN: [id number] DOB: [birth year]  Dalton [name] is a 55 y.o. year old male who is a primary care patient of Einar Pheasant, MD and is actively engaged with the care management team. I reached out to [name] by phone today to assist with re-scheduling a follow up visit with the Pharmacist  Follow up plan: Unsuccessful telephone outreach attempt made. A HIPAA compliant phone message was left for the patient providing contact information and requesting a return call.  The care management team will reach out to the patient again over the next 3 days.  If patient returns call to provider office, please advise to call [facility]  at [place], [place], [place],  [address] Direct Dial: [phone] [email or web address]

## 2020-08-17 NOTE — Telephone Encounter (Signed)
Sent link for video visit through Epic. Patient did not sign in. Called the patient  for appointment scheduled today. The patient did not answer the phone. Left voice message to contact the office.  

## 2020-08-21 ENCOUNTER — Ambulatory Visit (INDEPENDENT_AMBULATORY_CARE_PROVIDER_SITE_OTHER): Payer: 59 | Admitting: Licensed Clinical Social Worker

## 2020-08-21 ENCOUNTER — Other Ambulatory Visit: Payer: Self-pay

## 2020-08-21 DIAGNOSIS — F4310 Post-traumatic stress disorder, unspecified: Secondary | ICD-10-CM

## 2020-08-21 NOTE — Progress Notes (Addendum)
Virtual Visit via Video Note  I connected with Dalton [name] on 08/21/20 at 11:00 AM EST by a video enabled telemedicine application and verified that I am speaking with the correct person using two identifiers.  Location: Patient: work Provider: [facility]   I discussed the limitations of evaluation and management by telemedicine and the availability of in person appointments. The patient expressed understanding and agreed to proceed.   I discussed the assessment and treatment plan with the patient. The patient was provided an opportunity to ask questions and all were answered. The patient agreed with the plan and demonstrated an understanding of the instructions.   The patient was advised to call back or seek an in-person evaluation if the symptoms worsen or if the condition fails to improve as anticipated.  I provided 30 minutes of non-face-to-face time during this encounter.   Emauri Krygier R Kylinn Shropshire, LCSW    THERAPIST PROGRESS NOTE  Session Time: 11-11:30a  Participation Level: Active  Behavioral Response: Neat and Well GroomedAlertAnxious and Depressed  Type of Therapy: Individual Therapy  Treatment Goals addressed: anxiety/stress coping skills; improve overall mood  Interventions: Supportive and Other: trauma focused  Summary: Dalton [name] is a 55 y.o. male who presents with continuing mood-related symptoms related to PTSD diagnosis. Pt reports that he is able to manage most days when mood is more depressed, and that he is handling stress able.  Pt states that he is compliant with medication. Pt reports poor quality and quantity of sleep.    Allowed pt to explore and express thoughts and feelings about upcoming heart surgery--pt is able to weigh out the pros and cons of surgery and subsequent time off work. Pt feels that during the recovery time he is hoping to recover stress and emotion-wise.    Pt disclosed that he had a conversation about his relationship and is  feeling better about it. Pt is trying to be intentional about quality relationship time.   Discussed [employer] and life timelines.  Encouraged pt to continue focusing on self care, life balance, positive social engagement and focus on overall wellness.  Suicidal/Homicidal: NO   Therapist Response: [name] is continuing to focus on medication compliance. [name] is also continuing to explore trauma and understand the overall psychological impact of trauma on current emotions/mood. Anxiety has escalated due to pending surgical procedure. Progress is continuing to be intermittent/fluctuating. Treatment to continue as indicated.   Plan: Return again in 4 weeks.  Diagnosis: Axis I: Post Traumatic Stress Disorder    Axis II: No diagnosis    Rachel Bo Barbarajean Kinzler, LCSW 08/21/2020

## 2020-08-28 NOTE — Chronic Care Management (AMB) (Signed)
  Care Management   Note  08/28/2020 Name: Dalton [name] MRN: [id number] DOB: 1966-04-10  Dalton [name] is a 55 y.o. year old male who is a primary care patient of Einar Pheasant, MD and is actively engaged with the care management team. I reached out to [name] by phone today to assist with scheduling a follow up visit with the Pharmacist  Follow up plan: Second Unsuccessful telephone outreach attempt made. A HIPAA compliant phone message was left for the patient providing contact information and requesting a return call.  The care management team will reach out to the patient again over the next 4 days.  If patient returns call to provider office, please advise to call [place]  at [place], [place], [place], [place] [address] Direct Dial: [phone] [email or web address] Website: [email or web address]

## 2020-08-29 ENCOUNTER — Ambulatory Visit: Payer: Managed Care, Other (non HMO) | Admitting: Internal Medicine

## 2020-09-04 NOTE — Telephone Encounter (Signed)
Hey Catie  3rd unsuccessful outreach today

## 2020-09-04 NOTE — Chronic Care Management (AMB) (Signed)
  Care Management   Note  09/04/2020 Name: GORO WENRICK MRN: [id number] DOB: 05/08/1966  ISAAH FURRY is a 55 y.o. year old male who is a primary care patient of Einar Pheasant, MD and is actively engaged with the care management team. I reached out to [name] by phone today to assist with scheduling a follow up visit with the Pharmacist  Follow up plan: Unable to make contact on outreach attempts x 3. PCP Einar Pheasant, MD notified via routed documentation in medical record.   Noreene Larsson, [place], [place],  [address] Direct Dial: [phone] [email or web address]: [email or web address]

## 2020-09-06 ENCOUNTER — Ambulatory Visit (HOSPITAL_COMMUNITY): Payer: 59 | Attending: Cardiology

## 2020-09-06 ENCOUNTER — Other Ambulatory Visit: Payer: Self-pay

## 2020-09-06 DIAGNOSIS — I350 Nonrheumatic aortic (valve) stenosis: Secondary | ICD-10-CM | POA: Diagnosis present

## 2020-09-06 LAB — ECHOCARDIOGRAM COMPLETE
AR max vel: 1.63 cm2
AV Area VTI: 1.47 cm2
AV Area mean vel: 1.49 cm2
AV Mean grad: 18.3 mmHg
AV Peak grad: 27 mmHg
Ao pk vel: 2.6 m/s
P 1/2 time: 450 msec
S' Lateral: 4.1 cm

## 2020-09-06 MED ORDER — PERFLUTREN LIPID MICROSPHERE
1.0000 mL | INTRAVENOUS | Status: AC | PRN
  Administered 2020-09-06: 1 mL via INTRAVENOUS

## 2020-09-07 ENCOUNTER — Ambulatory Visit: Payer: Managed Care, Other (non HMO) | Admitting: Pharmacist

## 2020-09-07 DIAGNOSIS — I10 Essential (primary) hypertension: Secondary | ICD-10-CM

## 2020-09-07 DIAGNOSIS — E1165 Type 2 diabetes mellitus with hyperglycemia: Secondary | ICD-10-CM

## 2020-09-07 DIAGNOSIS — I4891 Unspecified atrial fibrillation: Secondary | ICD-10-CM

## 2020-09-07 DIAGNOSIS — E785 Hyperlipidemia, unspecified: Secondary | ICD-10-CM

## 2020-09-07 DIAGNOSIS — I350 Nonrheumatic aortic (valve) stenosis: Secondary | ICD-10-CM

## 2020-09-07 NOTE — Patient Instructions (Signed)
Visit Information  Goals Addressed              This Visit's Progress     Patient Stated   .  Medication Monitoring (pt-stated)        Patient Goals/Self-Care Activities . Over the next days, patient will:  - take medications as prescribed check blood glucose three times daily using CGm, document, and provide at future appointments        Patient verbalizes understanding of instructions provided today and agrees to view in [place].   Plan: Telephone follow up appointment with care management team member scheduled for:  ~ 6 weeks  Catie Darnelle Maffucci, PharmD, [place], [place] Clinical Pharmacist [employer] at [employer] [phone]

## 2020-09-07 NOTE — Chronic Care Management (AMB) (Signed)
Care Management   Pharmacy Note  09/07/2020 Name: Dalton [name] MRN: [id number] DOB: 1966-05-18  Subjective: Dalton [name] is a 55 y.o. year old male who is a primary care patient of Einar Pheasant, MD. The Care Management team was consulted for assistance with care management and care coordination needs.    Engaged with patient by telephone for follow up visit in response to provider referral for pharmacy case management and/or care coordination services. Spoke with patient's wife, Dalton [name], as patient was at work.   The patient was given information about Care Management services today including:  1. Care Management services includes personalized support from designated clinical staff supervised by the patient's primary care provider, including individualized plan of care and coordination with other care providers. 2. 24/7 contact phone numbers for assistance for urgent and routine care needs. 3. The patient may stop case management services at any time by phone call to the office staff.  Patient agreed to services and consent obtained.  Assessment:  Review of patient status, including review of consultants reports, laboratory and other test data, was performed as part of comprehensive evaluation and provision of chronic care management services.   SDOH (Social Determinants of Health) assessments and interventions performed:  SDOH Interventions   Flowsheet Row Most Recent Value  SDOH Interventions   Financial Strain Interventions Intervention Not Indicated       Objective:  Lab Results  Component Value Date   CREATININE 0.92 04/26/2020   CREATININE 1.06 01/17/2020   CREATININE 1.01 12/26/2019    Lab Results  Component Value Date   HGBA1C 7.9 (H) 04/26/2020       Component Value Date/Time   CHOL 137 04/26/2020 1133   TRIG 136.0 04/26/2020 1133   HDL 35.90 (L) 04/26/2020 1133   CHOLHDL 4 04/26/2020 1133   VLDL 27.2 04/26/2020 1133   [place] 74 04/26/2020 1133    LDLDIRECT 228.0 05/14/2018 0951   Clinical ASCVD: No  The 10-year ASCVD risk score [name], et al., 2013) is: 7.7%   Values used to calculate the score:     Age: 58 years     Sex: Male     Is Non-Hispanic African American: No     Diabetic: Yes     Tobacco smoker: No     Systolic Blood Pressure: 086 mmHg     Is BP treated: Yes     HDL Cholesterol: 35.9 mg/dL     Total Cholesterol: 137 mg/dL     CHA2DS2-VASc Score = 3  This indicates a 3.2% annual risk of stroke. The patient's score is based upon: CHF History: Yes HTN History: Yes Diabetes History: Yes Stroke History: No Vascular Disease History: No Age Score: 0 Gender Score: 0    BP Readings from Last 3 Encounters:  04/26/20 112/76  03/30/20 116/78  01/11/20 111/80    Care Plan  No Known Allergies  Medications Reviewed Today    Reviewed by De Hollingshead, RPH-CPP (Pharmacist) on 09/07/20 at 71  Med List Status: <None>  Medication Order Taking? Sig Documenting Provider Last Dose Status Informant  acetaminophen (TYLENOL) 500 MG tablet [id number] Yes Take 500-1,000 mg by mouth every 6 (six) hours as needed (for pain.). [provider] Taking Active Spouse/Significant Other  apixaban (ELIQUIS) 5 MG TABS tablet [id number] Yes Take 1 tablet (5 mg total) by mouth 2 (two) times daily. [name], MD Taking Active   atorvastatin (LIPITOR) 40 MG tablet [id number] Yes Take 1 tablet (40 mg  total) by mouth daily. Einar Pheasant, MD Taking Active Spouse/Significant Other  buPROPion (WELLBUTRIN XL) 150 MG 24 hr tablet [id number] Yes Take 1 tablet (150 mg total) by mouth daily. [name], MD Taking Active   cetirizine (ZYRTEC) 10 MG tablet [id number] Yes Take 10 mg by mouth 2 (two) times daily.  [provider] Taking Active Spouse/Significant Other           Med Note [name], [name]   Tue Aug 10, 2018  8:35 PM)    citalopram (CELEXA) 40 MG tablet [id number] Yes Take 40 mg by mouth every morning.  [provider] Taking Active   Continuous Blood Gluc Receiver (FREESTYLE LIBRE 2 READER) DEVI [id number] Yes Use to check glucose at least TID Einar Pheasant, MD Taking Active   Continuous Blood Gluc Sensor (FREESTYLE LIBRE 2 SENSOR) [place] [id number] Yes Use to check glucose at least [facility] Einar Pheasant, MD Taking Active   dapagliflozin propanediol (FARXIGA) 5 MG TABS tablet [id number] Yes Take 1 tablet (5 mg total) by mouth daily before breakfast. Einar Pheasant, MD Taking Active   fluticasone (FLONASE) 50 MCG/ACT nasal spray [id number] Yes Place 2 sprays into both nostrils daily as needed for allergies or rhinitis. [provider] Taking Active Spouse/Significant Other  glucose blood test strip [id number] Yes Use as instructed to check sugars BID. Dx e11.9 Einar Pheasant, MD Taking Active   lamoTRIgine (LAMICTAL) 150 MG tablet [id number] Yes Take 1 tablet (150 mg total) by mouth daily. [name], MD Taking Active   lamoTRIgine (LAMICTAL) 25 MG tablet [id number] Yes Take 50 mg by mouth daily. [provider] Taking Active   Lancets (ONETOUCH DELICA PLUS [id number]) [place] [id number] Yes USE TWICE DAILY AS DIRECTED Einar Pheasant, MD Taking Active Spouse/Significant Other  lisinopril (ZESTRIL) 20 MG tablet [id number] Yes Take 20 mg by mouth daily. [provider] Taking Active Self  metFORMIN (GLUCOPHAGE) 1000 MG tablet [id number] Yes TAKE 1 TABLET BY MOUTH TWICE DAILY Einar Pheasant, MD Taking Active   metoprolol succinate (TOPROL-XL) 50 MG 24 hr tablet [id number] Yes Take 1 tablet (50 mg total) by mouth daily. [name], MD Taking Active Self  [name] [id number] Yes as directed. CPAP [provider] Taking Active Spouse/Significant Other  QUEtiapine (SEROQUEL) 25 MG tablet [id number] Yes Take 25 mg by mouth at bedtime. [provider] Taking Active   Semaglutide,0.25 or 0.5MG /DOS, (OZEMPIC, 0.25 OR 0.5 MG/DOSE,) 2 MG/1.5ML SOPN [id number] Yes  Inject 0.25 mg weekly for 4 weeks then increase to 0.5 mg weekly Einar Pheasant, MD Taking Active   spironolactone (ALDACTONE) 25 MG tablet [id number] Yes Take 0.5 tablets (12.5 mg total) by mouth daily. [name], MD Taking Active           Patient Active Problem List   Diagnosis Date Noted  . PTSD (post-traumatic stress disorder) 04/28/2020  . Thyroid nodule 04/28/2020  . Healthcare maintenance 12/26/2019  . Chronic combined systolic and diastolic heart failure ([place])   . Abdominal pain 12/05/2018  . Type 2 diabetes mellitus with hyperglycemia ([place]) 12/05/2018  . Portal hypertensive gastropathy ([place]) 09/16/2018  . Abscess of left groin 07/23/2018  . Obesity, diabetes, and hypertension syndrome ([place]) 04/26/2018  . History of colon polyps 07/05/2017  . Chronic anticoagulation 01/09/2017  . Low testosterone level in male 09/21/2016  . Atrial fibrillation ([place]) 05/23/2016  . Sleep apnea 05/11/2016  . Nasal congestion 08/26/2015  . Environmental allergies 07/22/2015  . Essential hypertension 06/08/2014  . Hyperlipidemia 06/08/2014  .  Aortic valve stenosis     Conditions to be addressed/monitored: Atrial Fibrillation, HTN, HLD and DMII  Care Plan : Medication Management  Updates made by De Hollingshead, RPH-CPP since 09/07/2020 12:00 AM    Problem: Diabetes, Heart Failure, Aortic Disease     Long-Range Goal: Disease Progression Prevention   Recent Progress: On track  Priority: High  Note:   Current Barriers:  . Unable to achieve control of diabetes   Pharmacist Clinical Goal(s):  [name] Over the next 90 days, patient will achieve control of diabetes as evidenced by improvement in A1c through collaboration with PharmD and provider.    Interventions: . 1:1 collaboration with Einar Pheasant, MD regarding development and update of comprehensive plan of care as evidenced by provider attestation and co-signature . Inter-disciplinary care team collaboration (see longitudinal  plan of care) . Comprehensive medication review performed; medication list updated in electronic medical record  Health Maintenance: . Declines COVID booster . Upcoming aortic valve replacement surgery   Diabetes, complicated by heart failure (last EF 45-50%) . Uncontrolled but improved; current treatment: Farxiga 5 mg daily, metformin 1000 mg BID, Ozempic 0.25 mg weekly (has done 6 weeks) . Wife denies GI upset at this time on metformin IR 1000 mg tablets. Denies GI upset since starting Ozempic.  . Current glucose readings:   Fasting Afternoon  18-Feb 102 176  19-Feb 130 112  20-Feb 105 113  21-Feb 109 159  22-Feb 102 126  23-Feb 107 136  24-Feb 99 130  25-Feb 92 131  26-Feb 100 124  27-Feb 93 136  28-Feb 94 93  1-Mar 103 122  2-Mar 85 161  3-Mar 111 140  4-Mar 117   AVG 103 133   . Patient picked up CGM, wife notes he just hasn't started using it. Discussed with wife about how to set up, how to connect w/ Elenor Legato 2 app on his Android phone. Will send instructions via my MyChart. Discussed benefit of seeing glucose readings throughout the day rather than just at 2 points in time, especially given need for well controlled pre-op A1c.  . They are interested in continued support with weight loss. Increase Ozempic to 0.5 mg as previously instructed. Continue metformin 1000 mg BID and Farxiga 5 mg daily.  . If symptomatic hypoglycemia becomes a concern, consider reduction in metformin rather than Ozempic or [place].   Heart Failure: [name] Appropriately managed; current treatment: lisinopril 10 mg QAM, spironolactone 12.5 mg QHS, metoprolol succinate 50 mg QAM, Farxiga 5 mg daily   SBP DBP  18-Feb 129 66  19-Feb 130 59  20-Feb 121 87  21-Feb 130 80  22-Feb 118 73  23-Feb 120 66  24-Feb 116 71  25-Feb 129 75  26-Feb 129 81  27-Feb 155 55  28-Feb 124 68  1-Mar 131 80  2-Mar 141 75  3-Mar 137 72  4-Mar 130 73  AVG 129 72   . Weighing daily. Aware of concerning weight  increases . Continue current regimen at this time. Can consider Wilder Glade maximization in the future.   Hyperlipidemia and ASCVD risk reduction: . Uncontrolled, LDL not at goal <70 given diabetes and risk factors; current treatment: atorvastatin 40 mg daily . Antiplatelet therapy: none given concurrent anticoagulant  . Recommended to continue current regimen. Will discuss potential for increasing statin intensity at future visits  Atrial Fibrillation: . Controlled; current rate control: metoprolol succinate 50 mg daily; anticoagulant treatment: Eliquis 5 mg BID . Denies cost concerns at this time .  Recommended to continue current regimen  along with collaboration with cardiology  Mental Health: [name] Moderately well managed. Current regimen: bupropion XL 150 mg daily, citalopram 40 mg daily, lamotrigine 150 mg daily, quetiapine 25 mg QPM, missed last appt w/ Dr. Modesta Messing, follows w/ LCSW . Recommended to continue current regimen and collaboration with psychiatry at this time  Allergies: . Controlled per patient report; cetirizine 10 mg BID, fluticasone intranasal PRN . Recommended to continue current regimen at this time  Patient Goals/Self-Care Activities . Over the next days, patient will:  - take medications as prescribed check blood glucose at least three times daily using CGM, document, and provide at future appointments  Follow Up Plan: Telephone follow up appointment with care management team member scheduled for: ~6 weeks     Medication Assistance:  None required.  Patient affirms current coverage meets needs.  Follow Up:  Patient agrees to Care Plan and Follow-up.  Plan: Telephone follow up appointment with care management team member scheduled for:  ~ 6 weeks  Catie Darnelle Maffucci, PharmD, [place], [place] Clinical Pharmacist [employer] [facility] [employer] [phone]

## 2020-09-09 NOTE — Progress Notes (Signed)
Cardiology Office Note:    Date:  09/10/2020   ID:  [name], DOB 1966/02/21, MRN [id number]  PCP:  Einar Pheasant, MD  Cardiologist:  Sinclair Grooms, MD   Referring MD: Einar Pheasant, MD   Chief Complaint  Patient presents with  . Cardiac Valve Problem    Mixed aortic stenosis and regurgitation    History of Present Illness:    Dalton [name] is a 55 y.o. male with a hx of biscupid aortic valve with AS, prior SVT s/p RF ablation, HTN, HLD who presents for follow-up. Last echo 06/2018 EF45%:, grade 1 DD, severely calcifiedbicuspidAV leaflets, moderate aortic stenosis withmoderateAI, mildly dilated ascending aorta.Recent echo shows increasing LV cavity size. Saw Dr. Cyndia Bent who recommended proceeding with AVR surgery.  He is doing well. He sees Dr. Cyndia Bent on Wednesday. He denies chest pain, orthopnea, dyspnea on exertion, syncope, lower extremity swelling, and palpitations.  He is having difficulty sleeping despite wearing CPAP.  Past Medical History:  Diagnosis Date  . Aortic valve stenosis    a. Bicuspid AV - 2D Echo 06/2014 - mod AS, mild AI, mildly dilated aortic root.  [name] CAD (coronary artery disease)   . Carotid artery disease ([place])    a. Mild by duplex 05/2015 - 1-39% BICA. Repeat due 05/2017.  . Dilated aortic root ([place])    a. By echo 06/2014.  . Environmental allergies   . Hypercholesterolemia   . Hypertension   . Obesity (BMI 30-39.9) 04/26/2018  . Sleep apnea   . SVT (supraventricular tachycardia) ([place])    a. h/o SVT - Ablation done 2013.    Past Surgical History:  Procedure Laterality Date  . CARDIAC ELECTROPHYSIOLOGY STUDY AND ABLATION  08/21/2011  . COLONOSCOPY WITH PROPOFOL N/A 06/26/2017   Procedure: COLONOSCOPY WITH PROPOFOL;  Surgeon: Manya Silvas, MD;  Location: [facility] ENDOSCOPY;  Service: Endoscopy;  Laterality: N/A;  . ESOPHAGOGASTRODUODENOSCOPY (EGD) WITH PROPOFOL N/A 06/26/2017   Procedure: ESOPHAGOGASTRODUODENOSCOPY (EGD)  WITH PROPOFOL;  Surgeon: Manya Silvas, MD;  Location: [facility] ENDOSCOPY;  Service: Endoscopy;  Laterality: N/A;  . RIGHT/LEFT HEART CATH AND CORONARY ANGIOGRAPHY N/A 11/16/2019   Procedure: RIGHT/LEFT HEART CATH AND CORONARY ANGIOGRAPHY;  Surgeon: [name], MD;  Location: [place] CV LAB;  Service: Cardiovascular;  Laterality: N/A;  . SEPTOPLASTY N/A 03/22/2015   Procedure: SEPTOPLASTY  WITH RIGHT INFERIOR TURBINATE REDUCTION;  Surgeon: Margaretha Sheffield, MD;  Location: [place];  Service: ENT;  Laterality: N/A;  . SUPRAVENTRICULAR TACHYCARDIA ABLATION N/A 08/21/2011   Procedure: SUPRAVENTRICULAR TACHYCARDIA ABLATION;  Surgeon: Evans Lance, MD;  Location: [facility] CATH LAB;  Service: Cardiovascular;  Laterality: N/A;  . surgery for non descending testicle    . TEE WITHOUT CARDIOVERSION N/A 11/16/2019   Procedure: TRANSESOPHAGEAL ECHOCARDIOGRAM (TEE);  Surgeon: Geralynn Rile, MD;  Location: [place];  Service: Cardiovascular;  Laterality: N/A;  . TONSILLECTOMY      Current Medications: Current Meds  Medication Sig  . apixaban (ELIQUIS) 5 MG TABS tablet Take 1 tablet (5 mg total) by mouth 2 (two) times daily.  [name] atorvastatin (LIPITOR) 40 MG tablet Take 1 tablet (40 mg total) by mouth daily.  [name] buPROPion (WELLBUTRIN XL) 150 MG 24 hr tablet Take 1 tablet (150 mg total) by mouth daily.  . cetirizine (ZYRTEC) 10 MG tablet Take 10 mg by mouth 2 (two) times daily.   . citalopram (CELEXA) 40 MG tablet Take 40 mg by mouth every morning.  . Continuous Blood Gluc  Receiver (FREESTYLE LIBRE 2 READER) DEVI Use to check glucose at least TID  . Continuous Blood Gluc Sensor (FREESTYLE LIBRE 2 SENSOR) MISC Use to check glucose at least Q8H  . dapagliflozin propanediol (FARXIGA) 5 MG TABS tablet Take 1 tablet (5 mg total) by mouth daily before breakfast.  . fluticasone (FLONASE) 50 MCG/ACT nasal spray Place 2 sprays into both nostrils daily as needed for allergies or rhinitis.  [name] glucose  blood test strip Use as instructed to check sugars BID. Dx e11.9  . lamoTRIgine (LAMICTAL) 150 MG tablet Take 1 tablet (150 mg total) by mouth daily.  . Lancets (ONETOUCH DELICA PLUS [id number]) MISC USE TWICE DAILY AS DIRECTED  . lisinopril (ZESTRIL) 20 MG tablet Take 20 mg by mouth daily.  . metFORMIN (GLUCOPHAGE) 1000 MG tablet TAKE 1 TABLET BY MOUTH TWICE DAILY  . metoprolol succinate (TOPROL-XL) 50 MG 24 hr tablet Take 1 tablet (50 mg total) by mouth daily.  . NON FORMULARY as directed. CPAP  . QUEtiapine (SEROQUEL) 25 MG tablet Take 25 mg by mouth at bedtime.  . Semaglutide,0.25 or 0.5MG /DOS, (OZEMPIC, 0.25 OR 0.5 MG/DOSE,) 2 MG/1.5ML SOPN Inject 0.25 mg weekly for 4 weeks then increase to 0.5 mg weekly  . spironolactone (ALDACTONE) 25 MG tablet Take 0.5 tablets (12.5 mg total) by mouth daily.     Allergies:   Patient has no known allergies.   Social History   Socioeconomic History  . Marital status: Married    Spouse name: Not on file  . Number of children: 2  . Years of education: Not on file  . Highest education level: Not on file  Occupational History  . Occupation: truck Education administrator: Insurance account manager  Tobacco Use  . Smoking status: Former Smoker    Types: Cigarettes    Quit date: 11/04/1988    Years since quitting: 31.8  . Smokeless tobacco: Never Used  Vaping Use  . Vaping Use: Never used  Substance and Sexual Activity  . Alcohol use: Yes    Alcohol/week: 8.0 standard drinks    Types: 7 Glasses of wine, 1 Shots of liquor per week    Comment: occasionally  . Drug use: Not Currently    Comment: many years ago in highschool  . Sexual activity: Not Currently  Other Topics Concern  . Not on file  Social History Narrative   Very rare exercise   Social Determinants of Health   Financial Resource Strain: Low Risk   . Difficulty of Paying Living Expenses: Not hard at all  Food Insecurity: Not on file  Transportation Needs: Not on file  Physical Activity: Not on  file  Stress: Not on file  Social Connections: Not on file     Family History: The patient's family history includes Aortic stenosis in his maternal grandfather; Breast cancer in his maternal grandmother; Cancer in his maternal grandfather and another family member; Diabetes in an other family member.  ROS:   Please see the history of present illness.    Compliant with CPAP. Now on Seroquel to help sleep. Trying to lose weight. Multiple questions concerning downtime from surgery. All other systems reviewed and are negative.  EKGs/Labs/Other Studies Reviewed:    The following studies were reviewed today:  ECHOCARDIOGRAM 09/2020 IMPRESSIONS    1. Left ventricular ejection fraction, by estimation, is 60 to 65%. The  left ventricle has normal function. The left ventricle has no regional  wall motion abnormalities. Left ventricular diastolic function could not  be evaluated.  2. Right ventricular systolic function is normal. The right ventricular  size is mildly enlarged.  3. Right atrial size was mild to moderately dilated.  4. The mitral valve is normal in structure. Trivial mitral valve  regurgitation. No evidence of mitral stenosis.  5. The AV is poorly visualized. The aortic valve is abnormal.It is  reportedly bicuspid on prior echo but cannot discern on this study the  morphology of th e valve. The leaflets are heavily calcified. Aortic valve  regurgitation is mild. Mild to moderate  aortic valve stenosis. Aortic regurgitation PHT measures 450 msec. Aortic  valve area, by VTI measures 1.47 cm. Aortic valve mean gradient measures  18.2 mmHg. Aortic valve Vmax measures 2.60 m/s.  6. Aortic dilatation noted. There is mild dilatation of the aortic root,  measuring 38 mm. There is mild dilatation of the ascending aorta,  measuring 40 mm.  7. The inferior vena cava is normal in size with greater than 50%  respiratory variability, suggesting right atrial pressure of 3 mmHg.    EKG:  EKG we did not repeat the EKG today.  Recent Labs: 11/11/2019: Platelets 197 11/16/2019: Hemoglobin 14.6 04/26/2020: ALT 39; BUN 12; Creatinine, Ser 0.92; Potassium 4.7; Sodium 139  Recent Lipid Panel    Component Value Date/Time   CHOL 137 04/26/2020 1133   TRIG 136.0 04/26/2020 1133   HDL 35.90 (L) 04/26/2020 1133   CHOLHDL 4 04/26/2020 1133   VLDL 27.2 04/26/2020 1133   LDLCALC 74 04/26/2020 1133   LDLDIRECT 228.0 05/14/2018 0951    Physical Exam:    VS:  BP 112/64   Pulse 85   Ht 6\' 1"  (1.854 m)   Wt 264 lb 9.6 oz (120 kg)   SpO2 96%   BMI 34.91 kg/m     Wt Readings from Last 3 Encounters:  09/10/20 264 lb 9.6 oz (120 kg)  04/26/20 279 lb (126.6 kg)  03/30/20 279 lb (126.6 kg)     GEN: Morbidly obese. No acute distress HEENT: Normal NECK: No JVD. LYMPHATICS: No lymphadenopathy CARDIAC: 3/6 crescendo and 2/6 decrescendo aortic valve systolic and diastolic murmurs. Rarely irregular without gallop, or edema. VASCULAR:  Normal Pulses. No bruits. RESPIRATORY:  Clear to auscultation without rales, wheezing or rhonchi  ABDOMEN: Soft, non-tender, non-distended, No pulsatile mass, MUSCULOSKELETAL: No deformity  SKIN: Warm and dry NEUROLOGIC:  Alert and oriented x 3 PSYCHIATRIC:  Normal affect   ASSESSMENT:    1. Nonrheumatic aortic valve stenosis   2. Persistent atrial fibrillation ([place])   3. Chronic combined systolic and diastolic HF (heart failure), NYHA class 2 ([place])   4. Coronary artery disease of native artery of native heart with stable angina pectoris ([place])   5. Hyperlipidemia, unspecified hyperlipidemia type   6. Chronic anticoagulation   7. Essential hypertension   8. Obstructive sleep apnea syndrome   9. Educated about COVID-19 virus infection    PLAN:    In order of problems listed above:  1. Bicuspid aortic valve disease with aortopathy. Scheduled for [place] using a bioprosthetic valve on 09/24/2020 by Dr. Cyndia Bent. 2. Atrial fibs is  persistent. Will have left atrial appendage ligation and surgical maze. 3. Continue therapy for heart failure which includes Farxiga, Toprol, Zestril and spironolactone. This could explain the increase in LV systolic function noted on the recent echo. This is very reassuring. Would be interested in Dr. Vivi Martens thoughts about proceeding with surgery. I believe we should move forward as the anatomy/disease process will progress  over time. Currently happy that we were able to reverse some of the heart failure mechanics with "4 pillar therapy". 4. Continue preventive therapy. Lipid management and diabetes in particular. 5. Target LDL less than 70. Continue Lipitor 40 mg/day 6. Continue Eliquis indefinitely until/if atrial fibrillation ablation resolves arrhythmia and sinus rhythm can be maintained. 7. Target 130/80 mmHg 8. Compliant with CPAP. 9. Vaccinated and boosted.   Plan clinical follow-up 4 to 6 weeks after aortic valve replacement surgery by Dr. Cyndia Bent.   Medication Adjustments/Labs and Tests Ordered: Current medicines are reviewed at length with the patient today.  Concerns regarding medicines are outlined above.  No orders of the defined types were placed in this encounter.  No orders of the defined types were placed in this encounter.   There are no Patient Instructions on file for this visit.   Signed, Sinclair Grooms, MD  09/10/2020 9:13 AM    [place]

## 2020-09-10 ENCOUNTER — Encounter: Payer: Self-pay | Admitting: Interventional Cardiology

## 2020-09-10 ENCOUNTER — Other Ambulatory Visit: Payer: Self-pay

## 2020-09-10 ENCOUNTER — Ambulatory Visit (INDEPENDENT_AMBULATORY_CARE_PROVIDER_SITE_OTHER): Payer: 59 | Admitting: Interventional Cardiology

## 2020-09-10 VITALS — BP 112/64 | HR 85 | Ht 73.0 in | Wt 264.6 lb

## 2020-09-10 DIAGNOSIS — Z7901 Long term (current) use of anticoagulants: Secondary | ICD-10-CM

## 2020-09-10 DIAGNOSIS — Z0279 Encounter for issue of other medical certificate: Secondary | ICD-10-CM

## 2020-09-10 DIAGNOSIS — Z7189 Other specified counseling: Secondary | ICD-10-CM

## 2020-09-10 DIAGNOSIS — I25118 Atherosclerotic heart disease of native coronary artery with other forms of angina pectoris: Secondary | ICD-10-CM

## 2020-09-10 DIAGNOSIS — G4733 Obstructive sleep apnea (adult) (pediatric): Secondary | ICD-10-CM

## 2020-09-10 DIAGNOSIS — I10 Essential (primary) hypertension: Secondary | ICD-10-CM

## 2020-09-10 DIAGNOSIS — I4819 Other persistent atrial fibrillation: Secondary | ICD-10-CM

## 2020-09-10 DIAGNOSIS — I350 Nonrheumatic aortic (valve) stenosis: Secondary | ICD-10-CM

## 2020-09-10 DIAGNOSIS — I5042 Chronic combined systolic (congestive) and diastolic (congestive) heart failure: Secondary | ICD-10-CM | POA: Diagnosis not present

## 2020-09-10 DIAGNOSIS — E785 Hyperlipidemia, unspecified: Secondary | ICD-10-CM

## 2020-09-10 NOTE — Patient Instructions (Signed)
Medication Instructions:  Your physician recommends that you continue on your current medications as directed. Please refer to the Current Medication list given to you today.  *If you need a refill on your cardiac medications before your next appointment, please call your pharmacy*   Lab Work: None If you have labs (blood work) drawn today and your tests are completely normal, you will receive your results only by: [name] MyChart Message (if you have MyChart) OR . A paper copy in the mail If you have any lab test that is abnormal or we need to change your treatment, we will call you to review the results.   Testing/Procedures: None   Follow-Up: At [facility], you and your health needs are our priority.  As part of our continuing mission to provide you with exceptional heart care, we have created designated Provider Care Teams.  These Care Teams include your primary Cardiologist (physician) and Advanced Practice Providers (APPs -  Physician Assistants and Nurse Practitioners) who all work together to provide you with the care you need, when you need it.  We recommend signing up for the patient portal called "MyChart".  Sign up information is provided on this After Visit Summary.  MyChart is used to connect with patients for Virtual Visits (Telemedicine).  Patients are able to view lab/test results, encounter notes, upcoming appointments, etc.  Non-urgent messages can be sent to your provider as well.   To learn more about what you can do with MyChart, go to [email or web address].    Your next appointment:   4-6 week(s) after procedure  The format for your next appointment:   In Person  Provider:   You may see Sinclair Grooms, MD or one of the following Advanced Practice Providers on your designated Care Team:    Kathyrn Drown, NP    Other Instructions

## 2020-09-12 ENCOUNTER — Encounter: Payer: Self-pay | Admitting: Surgery

## 2020-09-12 ENCOUNTER — Ambulatory Visit: Payer: 59 | Admitting: Surgery

## 2020-09-12 ENCOUNTER — Telehealth: Payer: Self-pay

## 2020-09-12 ENCOUNTER — Other Ambulatory Visit (HOSPITAL_COMMUNITY): Payer: Self-pay | Admitting: Psychiatry

## 2020-09-12 ENCOUNTER — Other Ambulatory Visit: Payer: Self-pay

## 2020-09-12 VITALS — BP 119/86 | HR 98 | Resp 20 | Ht 73.0 in

## 2020-09-12 DIAGNOSIS — I350 Nonrheumatic aortic (valve) stenosis: Secondary | ICD-10-CM | POA: Diagnosis not present

## 2020-09-12 MED ORDER — BUPROPION HCL ER (XL) 150 MG PO TB24: 150.0000 mg | ORAL_TABLET | Freq: Every day | ORAL | 1 refills | Status: DC

## 2020-09-12 MED ORDER — QUETIAPINE FUMARATE 25 MG PO TABS: 25.0000 mg | ORAL_TABLET | Freq: Every day | ORAL | 0 refills | Status: DC

## 2020-09-12 NOTE — Telephone Encounter (Signed)
sent 

## 2020-09-12 NOTE — Progress Notes (Signed)
HPI:  The patient returns today to discuss his planned surgery for aortic valve replacement and Maze procedure.  He has been feeling fairly well.  He paces himself at work.  He denies any significant chest pain or shortness of breath but is exhausted at the end of the day.  He has had intermittent lower extremity edema.  He had a follow-up echo on 09/06/2020 which I have reviewed.  It is a poor quality study but essentially the same as his prior echo.  This shows a bicuspid aortic valve with heavily calcified leaflets with restricted mobility.  The mean gradient measured across the valve was 18.2 mmHg.  There is moderate aortic insufficiency.  Left ventricular ejection fraction still looks in the 45 to 50% range to me although it was read as 60 to 65% in the echo report.  CTA of the chest done 02/02/2020 had shown the ascending aorta to be mildly enlarged at 3.6 cm with an aortic root diameter of 3.3 cm.  The proximal descending thoracic aorta was 3 cm.  Current Outpatient Medications  Medication Sig Dispense Refill  . acetaminophen (TYLENOL) 500 MG tablet Take 1,000 mg by mouth every 6 (six) hours as needed for moderate pain or headache.    [name] apixaban (ELIQUIS) 5 MG TABS tablet Take 1 tablet (5 mg total) by mouth 2 (two) times daily. 180 tablet 3  . atorvastatin (LIPITOR) 40 MG tablet Take 1 tablet (40 mg total) by mouth daily. 30 tablet 2  . cetirizine (ZYRTEC) 10 MG tablet Take 10 mg by mouth 2 (two) times daily.     . citalopram (CELEXA) 40 MG tablet Take 40 mg by mouth every morning.    . Continuous Blood Gluc Receiver (FREESTYLE LIBRE 2 READER) DEVI Use to check glucose at least TID 1 each 1  . Continuous Blood Gluc Sensor (FREESTYLE LIBRE 2 SENSOR) MISC Use to check glucose at least Q8H 2 each 11  . dapagliflozin propanediol (FARXIGA) 5 MG TABS tablet Take 1 tablet (5 mg total) by mouth daily before breakfast. 30 tablet 2  . fluticasone (FLONASE) 50 MCG/ACT nasal spray Place 2 sprays into  both nostrils daily as needed for allergies or rhinitis.    [name] glucose blood test strip Use as instructed to check sugars BID. Dx e11.9 100 each 12  . lamoTRIgine (LAMICTAL) 150 MG tablet Take 1 tablet (150 mg total) by mouth daily. 30 tablet 0  . Lancets (ONETOUCH DELICA PLUS HYWVPX10G) MISC USE TWICE DAILY AS DIRECTED 100 each 7  . lisinopril (ZESTRIL) 20 MG tablet Take 20 mg by mouth daily.    . metFORMIN (GLUCOPHAGE) 1000 MG tablet TAKE 1 TABLET BY MOUTH TWICE DAILY (Patient taking differently: Take 1,000 mg by mouth 2 (two) times daily.) 120 tablet 1  . metoprolol succinate (TOPROL-XL) 50 MG 24 hr tablet Take 1 tablet (50 mg total) by mouth daily. 30 tablet 11  . NON FORMULARY as directed. CPAP    . Semaglutide,0.25 or 0.5MG /DOS, (OZEMPIC, 0.25 OR 0.5 MG/DOSE,) 2 MG/1.5ML SOPN Inject 0.25 mg weekly for 4 weeks then increase to 0.5 mg weekly (Patient taking differently: Inject 0.5 mg as directed every Saturday.) 4.5 mL 1  . spironolactone (ALDACTONE) 25 MG tablet Take 0.5 tablets (12.5 mg total) by mouth daily. 45 tablet 3  . buPROPion (WELLBUTRIN XL) 150 MG 24 hr tablet Take 1 tablet (150 mg total) by mouth daily. 30 tablet 1  . QUEtiapine (SEROQUEL) 25 MG tablet Take 1  tablet (25 mg total) by mouth at bedtime. 30 tablet 0   No current facility-administered medications for this visit.     Physical Exam: BP 119/86 (BP Location: Left Arm, Patient Position: Sitting)   Pulse 98 Comment: AFib  Resp 20   Ht 6\' 1"  (1.854 m)   SpO2 98% Comment: RA  BMI 34.91 kg/m  He looks well. Cardiac exam shows a regular rate and rhythm with a 3/6 systolic murmur along the right sternal border and a 2/6 diastolic murmur at the left lower sternal border. Lungs are clear. There is no peripheral edema   Impression:  This 55 year old gentleman has a bicuspid aortic valve that is heavily calcified with restricted mobility with a mean gradient of 18.2 mmHg consistent with moderate aortic stenosis and has  moderate aortic insufficiency.  He has a relatively low stroke-volume index of 32 and a left ventricular diastolic internal diameter of 5.6 cm.  He has had progressive left ventricular deterioration over the past few years likely related to the combination of moderate aortic stenosis and insufficiency.  He also has chronic atrial fibrillation which is likely exacerbating his diastolic congestive heart failure.  I reviewed all the results with him and his wife and they are in agreement with proceeding with aortic valve replacement and Maze procedure.  I discussed the options of mechanical and bioprosthetic valves.  We have decided to use a bioprosthetic valve so that he does not need to be on lifelong Coumadin. I discussed the operative procedure with the patient and his wife including alternatives, benefits and risks; including but not limited to bleeding, blood transfusion, infection, stroke, myocardial infarction, graft failure, heart block requiring a permanent pacemaker, structural valve deterioration,organ dysfunction, and death.  [name] understands and agrees to proceed.    Plan:  He has been scheduled for aortic valve replacement using a bioprosthetic valve and maze procedure on Monday, 09/24/2020.  I spent 20 minutes performing this established patient evaluation and > 50% of this time was spent face to face counseling and coordinating the care of this patient's bicuspid aortic stenosis and insufficiency.    Gaye Pollack, MD [facility] [phone]

## 2020-09-12 NOTE — Telephone Encounter (Signed)
pt wife called states that dr. Modesta Messing was going to take over the reills for the bupropion and the quetiapine and he is out and needs it refilled.   Pt was a transfer from dr. Cephus Shelling to Dr. Modesta Messing

## 2020-09-14 ENCOUNTER — Telehealth: Payer: Self-pay | Admitting: Interventional Cardiology

## 2020-09-14 NOTE — Telephone Encounter (Signed)
Forms from [employer] received on 09/14/20. Completed patient auth attached. Took form to Dr. Thompson Caul Box for completion. JG 09/14/20

## 2020-09-19 ENCOUNTER — Encounter (HOSPITAL_COMMUNITY): Payer: Self-pay | Admitting: Certified Registered"

## 2020-09-20 ENCOUNTER — Encounter (HOSPITAL_COMMUNITY)
Admission: RE | Admit: 2020-09-20 | Discharge: 2020-09-20 | Disposition: A | Discharge status: Home / Self Care | Payer: 59 | Source: Ambulatory Visit | Attending: Surgery | Admitting: Surgery

## 2020-09-20 ENCOUNTER — Other Ambulatory Visit: Payer: Self-pay

## 2020-09-20 ENCOUNTER — Encounter (HOSPITAL_COMMUNITY): Payer: Self-pay

## 2020-09-20 ENCOUNTER — Ambulatory Visit: Payer: 59 | Admitting: Podiatry

## 2020-09-20 ENCOUNTER — Ambulatory Visit (HOSPITAL_COMMUNITY)
Admission: RE | Admit: 2020-09-20 | Discharge: 2020-09-20 | Disposition: A | Discharge status: Home / Self Care | Payer: 59 | Source: Ambulatory Visit | Attending: Surgery | Admitting: Surgery

## 2020-09-20 ENCOUNTER — Ambulatory Visit (INDEPENDENT_AMBULATORY_CARE_PROVIDER_SITE_OTHER): Payer: 59

## 2020-09-20 ENCOUNTER — Other Ambulatory Visit: Payer: Self-pay | Admitting: *Deleted

## 2020-09-20 ENCOUNTER — Other Ambulatory Visit (HOSPITAL_COMMUNITY): Payer: Managed Care, Other (non HMO)

## 2020-09-20 DIAGNOSIS — S90211A Contusion of right great toe with damage to nail, initial encounter: Secondary | ICD-10-CM

## 2020-09-20 DIAGNOSIS — S92421A Displaced fracture of distal phalanx of right great toe, initial encounter for closed fracture: Secondary | ICD-10-CM

## 2020-09-20 DIAGNOSIS — I351 Nonrheumatic aortic (valve) insufficiency: Secondary | ICD-10-CM | POA: Diagnosis present

## 2020-09-20 DIAGNOSIS — I350 Nonrheumatic aortic (valve) stenosis: Secondary | ICD-10-CM | POA: Insufficient documentation

## 2020-09-20 HISTORY — DX: Headache, unspecified: R51.9

## 2020-09-20 HISTORY — DX: Unspecified osteoarthritis, unspecified site: M19.90

## 2020-09-20 HISTORY — DX: Post-traumatic stress disorder, unspecified: F43.10

## 2020-09-20 LAB — COMPREHENSIVE METABOLIC PANEL
ALT: 24 U/L (ref 0–44)
AST: 17 U/L (ref 15–41)
Albumin: 4.4 g/dL (ref 3.5–5.0)
Alkaline Phosphatase: 78 U/L (ref 38–126)
Anion gap: 8 (ref 5–15)
BUN: 11 mg/dL (ref 6–20)
CO2: 22 mmol/L (ref 22–32)
Calcium: 8.9 mg/dL (ref 8.9–10.3)
Chloride: 105 mmol/L (ref 98–111)
Creatinine, Ser: 0.96 mg/dL (ref 0.61–1.24)
GFR, Estimated: >60 mL/min (ref >60)
Glucose, Bld: 105 mg/dL — ABNORMAL HIGH (ref 70–99)
Potassium: 4.3 mmol/L (ref 3.5–5.1)
Sodium: 135 mmol/L (ref 135–145)
Total Bilirubin: 0.7 mg/dL (ref 0.3–1.2)
Total Protein: 6.9 g/dL (ref 6.5–8.1)

## 2020-09-20 LAB — CBC
HCT: 45.9 % (ref 39.0–52.0)
Hemoglobin: 15.5 g/dL (ref 13.0–17.0)
MCH: 31.6 pg (ref 26.0–34.0)
MCHC: 33.8 g/dL (ref 30.0–36.0)
MCV: 93.7 fL (ref 80.0–100.0)
Platelets: 198 10*3/uL (ref 150–400)
RBC: 4.9 MIL/uL (ref 4.22–5.81)
RDW: 13.5 % (ref 11.5–15.5)
WBC: 7.3 10*3/uL (ref 4.0–10.5)
nRBC: 0 % (ref 0.0–0.2)

## 2020-09-20 LAB — HEMOGLOBIN A1C
Hgb A1c MFr Bld: 6.1 % — ABNORMAL HIGH (ref 4.8–5.6)
Mean Plasma Glucose: 128.37 mg/dL

## 2020-09-20 LAB — BLOOD GAS, ARTERIAL
Acid-base deficit: 1.7 mmol/L (ref 0.0–2.0)
Bicarbonate: 22.2 mmol/L (ref 20.0–28.0)
Drawn by: 58793
FIO2: 21
O2 Saturation: 98.5 %
Patient temperature: 37
pCO2 arterial: 34.9 mmHg (ref 32.0–48.0)
pH, Arterial: 7.419 (ref 7.350–7.450)
pO2, Arterial: 117 mmHg — ABNORMAL HIGH (ref 83.0–108.0)

## 2020-09-20 LAB — URINALYSIS, ROUTINE W REFLEX MICROSCOPIC
Bacteria, UA: NONE SEEN
Bilirubin Urine: NEGATIVE
Glucose, UA: >=500 mg/dL — AB
Hgb urine dipstick: NEGATIVE
Ketones, ur: 5 mg/dL — AB
Leukocytes,Ua: NEGATIVE
Nitrite: NEGATIVE
Protein, ur: NEGATIVE mg/dL
Specific Gravity, Urine: 1.028 (ref 1.005–1.030)
pH: 5 (ref 5.0–8.0)

## 2020-09-20 LAB — SURGICAL PCR SCREEN
MRSA, PCR: NEGATIVE
Staphylococcus aureus: POSITIVE — AB

## 2020-09-20 LAB — APTT: aPTT: 29 seconds (ref 24–36)

## 2020-09-20 LAB — GLUCOSE, CAPILLARY: Glucose-Capillary: 98 mg/dL (ref 70–99)

## 2020-09-20 LAB — PROTIME-INR
INR: 1 (ref 0.8–1.2)
Prothrombin Time: 12.9 seconds (ref 11.4–15.2)

## 2020-09-20 MED ORDER — CEPHALEXIN 500 MG PO CAPS: 500.0000 mg | ORAL_CAPSULE | Freq: Three times a day (TID) | ORAL | 0 refills | Status: DC

## 2020-09-20 MED ORDER — MUPIROCIN 2 % EX OINT: 1.0000 "application " | TOPICAL_OINTMENT | Freq: Two times a day (BID) | CUTANEOUS | 2 refills | Status: DC

## 2020-09-20 NOTE — Progress Notes (Addendum)
PCP - Dr. Einar Pheasant  Cardiologist - Dr. Daneen Schick  Chest x-ray - 09/20/20  EKG - 09/20/20  Stress Test - no  ECHO - 09/06/20  Cardiac Cath - 11/16/19   Sleep Study -  CPAP - yes  LABS-CBC, CMP, PT, PTT, ABG, T/S, PCR, UA  ASA-no Patient instructed by Dr Cyndia Bent to stop Eliquis for 1 week - patient stopped it on 09/17/20 ERAS-no  HA1C-09/20/20 Fasting Blood Sugar - 90-120 Checks Blood Sugar ___1__ times a day  Anesthesia- Dalton [name] dropped a wood cutter on he is foot- Right Great Toe Monday, March 14.  Pain is 5/10. There is brusing around toe and on the lateral side of the foot.  The toe is swollen, there is a small blister around the base of his toe. I appears that a blister has broken and I s leaking yellow, bloody drainage.  Dalton [name] reports that his wife has been cleaning it, applying antibiotic ointiment. I asked Dalton Caldwell, PA-C to take a look at the toe, Dalton [name] did and then called Mervin Hack, RN, Dalton [name] took pictures and sent them to [facility] and Dr. Cyndia Bent. Dalton [name] now has an appointment with Dr. Britt Bottom, with [facility]. today at 4 pm.  Pt denies having chest pain, sob, or fever at this time. All instructions explained to the pt, with a verbal understanding of the material. Pt agrees to go over the instructions while at home for a better understanding. Pt also instructed to self quarantine after being tested for COVID-19. The opportunity to ask questions was provided. ^

## 2020-09-20 NOTE — Pre-Procedure Instructions (Addendum)
Dalton [name]  09/20/2020    Your procedure is scheduled on Monday, March 21.  Report to [facility], Main Entrance or Entrance "A" at 5:30 AM                Your surgery or procedure is scheduled to begin at 7:30 AM   Call this number if you have problems the morning of surgery: [phone]  This is the number for the Pre- Surgical Desk.                For any other questions, please call [phone], Monday - Friday 8 AM - 4 PM.   Remember:  Do not eat or drink after midnight Sunday, March 20.    Take these medicines the morning of surgery with A SIP OF WATER: atorvastatin (LIPITOR)  cetirizine (ZYRTEC) citalopram (CELEXA) fluticasone (FLONASE) nasal spray lamoTRIgine (LAMICTAL)  metoprolol succinate (TOPROL-XL)  Bupropin Take if needed: acetaminophen (TYLENOL)   WHAT DO I DO ABOUT MY DIABETES MEDICATION? HOLD METFORMIN 2 DAYS PRIOR TO AND MONDAY MORNING As instructed by Dr. Cyndia Bent. HOLD FAXIGA SUNDAY AND MONDAY AM.   HOLD ELIQUIS 1 WEEK PRIOR TO SURGERYAs instructed by Dr. Cyndia Bent  STOP taking Aspirin, Aspirin Products (Goody Powder, Excedrin Migraine), Ibuprofen (Advil), Naproxen (Aleve), Vitamins and Herbal Products (ie Fish Oil).   How to Manage Your Diabetes Before and After Surgery  Why is it important to control my blood sugar before and after surgery? . Improving blood sugar levels before and after surgery helps healing and can limit problems. . A way of improving blood sugar control is eating a healthy diet by: o  Eating less sugar and carbohydrates o  Increasing activity/exercise o  Talking with your doctor about reaching your blood sugar goals . High blood sugars (greater than 180 mg/dL) can raise your risk of infections and slow your recovery, so you will need to focus on controlling your diabetes during the weeks before surgery. . Make sure that the doctor who takes care of your diabetes knows about your planned surgery including the date  and location.  How do I manage my blood sugar before surgery? . Check your blood sugar at least 4 times a day, starting 2 days before surgery, to make sure that the level is not too high or low. o Check your blood sugar the morning of your surgery when you wake up and every 2 hours until you get to the Short Stay unit. . If your blood sugar is less than 70 mg/dL, you will need to treat for low blood sugar: o Do not take insulin. o Treat a low blood sugar (less than 70 mg/dL) with  cup of clear juice (cranberry or apple), 4 glucose tablets, OR glucose gel. Recheck blood sugar in 15 minutes after treatment (to make sure it is greater than 70 mg/dL). If your blood sugar is not greater than 70 mg/dL on recheck, call [phone] o  for further instructions. . Report your blood sugar to the short stay nurse when you get to Short Stay.  . If you are admitted to the hospital after surgery: o Your blood sugar will be checked by the staff and you will probably be given insulin after surgery (instead of oral diabetes medicines) to make sure you have good blood sugar levels. o The goal for blood sugar control after surgery is 80-180 mg/dL.  STOP taking Aspirin, Aspirin Products (Goody Powder, Excedrin Migraine), Ibuprofen (Advil), Naproxen (Aleve), Vitamins and  Herbal Products (ie Fish Oil).   Special instructions:  Bring the Mask to your CPAP  [place]- Preparing For Surgery  Before surgery, you can play an important role. Because skin is not sterile, your skin needs to be as free of germs as possible. You can reduce the number of germs on your skin by washing with CHG (chlorahexidine gluconate) Soap before surgery.  CHG is an antiseptic cleaner which kills germs and bonds with the skin to continue killing germs even after washing.    Oral Hygiene is also important to reduce your risk of infection.  Remember - BRUSH YOUR TEETH THE MORNING OF SURGERY WITH YOUR REGULAR TOOTHPASTE  Please do not use  if you have an allergy to CHG or antibacterial soaps. If your skin becomes reddened/irritated stop using the CHG.  Do not shave (including legs and underarms) for at least 48 hours prior to first CHG shower. It is OK to shave your face.  Please follow these instructions carefully.   1. Shower the NIGHT BEFORE SURGERY and the MORNING OF SURGERY with CHG.   2. If you chose to wash your hair, wash your hair first as usual with your normal shampoo.  3. After you shampoo, wash your face and private area with the soap you use at home, then rinse your hair and body thoroughly to remove the shampoo and soap.  4. Use CHG as you would any other liquid soap. You can apply CHG directly to the skin and wash gently with a scrungie or a clean washcloth.   5. Apply the CHG Soap to your body ONLY FROM THE NECK DOWN.  Do not use on open wounds or open sores. Avoid contact with your eyes, ears, mouth and genitals (private parts).   6. Wash thoroughly, paying special attention to the area where your surgery will be performed.  7. Thoroughly rinse your body with warm water from the neck down.  8. DO NOT shower/wash with your normal soap after using and rinsing off the CHG Soap.  9. Pat yourself dry with a CLEAN TOWEL.  10. Wear CLEAN PAJAMAS to bed the night before surgery, wear comfortable clothes the morning of surgery  11. Place CLEAN SHEETS on your bed the night of your first shower and DO NOT SLEEP WITH PETS.  Day of Surgery: Shower as instructed above. Do not apply any deodorants/lotions, powders or colognes.  Please wear clean clothes to the hospital/surgery center.   Remember to brush your teeth WITH YOUR REGULAR TOOTHPASTE.  Do not wear jewelry, make-up or nail polish.  Do not shave 48 hours prior to surgery.  Men may shave face and neck.  Do not bring valuables to the hospital.  [facility] is not responsible for any belongings or valuables.  Contacts, dentures or bridgework may not be worn  into surgery.  Leave your suitcase in the car.  After surgery it may be brought to your room.  For patients admitted to the hospital, discharge time will be determined by your treatment team.  Patients discharged the day of surgery will not be allowed to drive home.   Please read over the fact sheets that you were given.

## 2020-09-21 ENCOUNTER — Other Ambulatory Visit (HOSPITAL_COMMUNITY): Payer: 59

## 2020-09-21 ENCOUNTER — Telehealth: Payer: Self-pay | Admitting: Podiatry

## 2020-09-21 ENCOUNTER — Encounter: Payer: Self-pay | Admitting: Podiatry

## 2020-09-21 LAB — HM DIABETES EYE EXAM

## 2020-09-21 NOTE — Telephone Encounter (Signed)
Called back [facility] to speak with [place], but wasn't able to speak with her. On our end we can see the x-rays on PACs underneath the imaging.

## 2020-09-21 NOTE — Telephone Encounter (Signed)
[place] from [facility] calling to let you know they cannot view the patients XRAY. Patient arrived at [facility] for appointment today but can not proceed if they can not see the XRAY.  Please advise or contact [phone]

## 2020-09-21 NOTE — Progress Notes (Signed)
  Subjective:  Patient ID: Dalton [name], male    DOB: [birth year],  MRN: [id number]  Chief Complaint  Patient presents with  . Toe Injury    Right hallux Pt dropped a log splitter on his toe on Monday    55 y.o. male presents with the above complaint. History confirmed with patient. Having a valve replacement on Monday and they wanted it examined prior to surgery.  Objective:  Physical Exam: warm, good capillary refill, no trophic changes or ulcerative lesions, normal DP and PT pulses and normal sensory exam.   Right Foot: Significant bruising and edema of the hallux with pain to palpation, there is a superficial abrasion of the toe, no deep ulceration or signs of infection.  There are fracture blisters present.  Radiographs: X-ray of the right foot: Comminuted fracture of the distal phalanx right hallux, mild plantar displacement, majority of articular surface appears to be intact Assessment:   1. Contusion of right great toe with damage to nail, initial encounter      Plan:  Patient was evaluated and treated and all questions answered.  I reviewed the radiographic and clinical findings with the patient and his wife.  Discussed with him that he does have a comminuted fracture of the distal phalanx, the joint space does not appear to be maintained and that this does not require any sort of operative intervention.  Recommend he be WBAT in a CAM boot which I dispensed today.  He does have a superficial abrasion and some fracture blisters.  These do not have any evidence of infection or deep ulceration, they should heal uneventfully.  I advised him to put the mupirocin ointment on them daily as a precaution.  Also prescription for Keflex was sent to his pharmacy as prophylaxis in setting of his upcoming heart valve replacement.  I see no contraindication to proceeding with his cardiac surgery from a podiatric standpoint.  Like to see him back in about a month after he has had some time to  recover from his heart surgery and take new x-rays to evaluate for fracture healing.  Return in about 5 weeks (around 10/25/2020) for check left foot fracture and new x-rays.

## 2020-09-22 ENCOUNTER — Other Ambulatory Visit: Payer: Self-pay | Admitting: *Deleted

## 2020-09-22 ENCOUNTER — Encounter: Payer: Self-pay | Admitting: *Deleted

## 2020-09-22 DIAGNOSIS — I350 Nonrheumatic aortic (valve) stenosis: Secondary | ICD-10-CM

## 2020-09-22 DIAGNOSIS — I4891 Unspecified atrial fibrillation: Secondary | ICD-10-CM

## 2020-09-22 DIAGNOSIS — Q231 Congenital insufficiency of aortic valve: Secondary | ICD-10-CM

## 2020-09-24 ENCOUNTER — Inpatient Hospital Stay (HOSPITAL_COMMUNITY): Admission: RE | Admit: 2020-09-24 | Payer: 59 | Source: Home / Self Care | Admitting: Surgery

## 2020-09-24 DIAGNOSIS — Z01818 Encounter for other preprocedural examination: Secondary | ICD-10-CM

## 2020-09-24 DIAGNOSIS — I350 Nonrheumatic aortic (valve) stenosis: Secondary | ICD-10-CM

## 2020-09-24 DIAGNOSIS — I351 Nonrheumatic aortic (valve) insufficiency: Secondary | ICD-10-CM

## 2020-09-24 SURGERY — REPLACEMENT, AORTIC VALVE, OPEN
Anesthesia: General | Site: Chest

## 2020-10-01 ENCOUNTER — Ambulatory Visit (INDEPENDENT_AMBULATORY_CARE_PROVIDER_SITE_OTHER): Payer: 59 | Admitting: Podiatry

## 2020-10-01 ENCOUNTER — Ambulatory Visit (INDEPENDENT_AMBULATORY_CARE_PROVIDER_SITE_OTHER): Payer: 59

## 2020-10-01 ENCOUNTER — Encounter: Payer: Self-pay | Admitting: Surgery

## 2020-10-01 ENCOUNTER — Encounter: Payer: Self-pay | Admitting: Podiatry

## 2020-10-01 ENCOUNTER — Other Ambulatory Visit: Payer: Self-pay

## 2020-10-01 DIAGNOSIS — S92421D Displaced fracture of distal phalanx of right great toe, subsequent encounter for fracture with routine healing: Secondary | ICD-10-CM | POA: Diagnosis not present

## 2020-10-01 DIAGNOSIS — S90211D Contusion of right great toe with damage to nail, subsequent encounter: Secondary | ICD-10-CM | POA: Diagnosis not present

## 2020-10-01 NOTE — Patient Instructions (Signed)
Continue using the mupirocin ointment  Wear the shoe for 2 more weeks then you can return to regular shoes

## 2020-10-01 NOTE — Progress Notes (Signed)
  Subjective:  Patient ID: Dalton [name], male    DOB: [birth year],  MRN: [id number]  Chief Complaint  Patient presents with  . Fracture    PT stated that he has a stinging sensation in his toe but no pain and no concerns     55 y.o. male presents with the above complaint. History confirmed with patient.  His malplacement was delayed due to his injury  Objective:  Physical Exam: warm, good capillary refill, no trophic changes or ulcerative lesions, normal DP and PT pulses and normal sensory exam.   Right Foot: Edema and ecchymosis much improved since last visit, the fracture blisters have completely resolved, there is some ecchymosis under the toenail.  The nail is well attached.  No signs of infection.  No cellulitis.  No drainage.  There is a scab on the dorsal portion of the hallux.  Radiographs: X-ray of the right foot: New radiographs taken, no change in alignment, no appreciable fracture healing since last visit     Assessment:   1. Closed displaced fracture of distal phalanx of right great toe with routine healing, subsequent encounter   2. Contusion of right great toe with damage to nail, subsequent encounter      Plan:  Patient was evaluated and treated and all questions answered.  Reviewed the radiographic and clinical exam findings with patient and his wife again today.  He does still have a small area of delayed healing that he is applying mupirocin ointment to.  This is superficial.  Does not extend beyond the dermis.  Did not require debridement today.  No signs of infection.  Fracture blisters have resolved.  I will relay this information to his cardiothoracic surgeon with the attached photos for his evaluation if he should proceed with surgery this week.  I expect that small superficial abrasion to take another 1 to 2 weeks to heal completely.  Recommend he be WBAT in a surgical shoe for another 2 weeks and then can resume regular shoe gear at that time.  Return  for after heart surgery to follow-up on fracture.

## 2020-10-02 ENCOUNTER — Encounter (HOSPITAL_COMMUNITY)
Admission: RE | Admit: 2020-10-02 | Discharge: 2020-10-02 | Disposition: A | Discharge status: Home / Self Care | Payer: 59 | Source: Ambulatory Visit | Attending: Surgery | Admitting: Surgery

## 2020-10-02 ENCOUNTER — Other Ambulatory Visit (HOSPITAL_COMMUNITY)
Admission: RE | Admit: 2020-10-02 | Discharge: 2020-10-02 | Disposition: A | Discharge status: Home / Self Care | Payer: 59 | Source: Ambulatory Visit | Attending: Surgery | Admitting: Surgery

## 2020-10-02 ENCOUNTER — Other Ambulatory Visit: Payer: Self-pay

## 2020-10-02 ENCOUNTER — Encounter (HOSPITAL_COMMUNITY): Payer: Self-pay

## 2020-10-02 DIAGNOSIS — Q231 Congenital insufficiency of aortic valve: Secondary | ICD-10-CM

## 2020-10-02 DIAGNOSIS — Z01812 Encounter for preprocedural laboratory examination: Secondary | ICD-10-CM | POA: Insufficient documentation

## 2020-10-02 DIAGNOSIS — I4891 Unspecified atrial fibrillation: Secondary | ICD-10-CM

## 2020-10-02 DIAGNOSIS — I350 Nonrheumatic aortic (valve) stenosis: Secondary | ICD-10-CM

## 2020-10-02 DIAGNOSIS — Z20822 Contact with and (suspected) exposure to covid-19: Secondary | ICD-10-CM | POA: Insufficient documentation

## 2020-10-02 HISTORY — DX: Dyspnea, unspecified: R06.00

## 2020-10-02 HISTORY — DX: Type 2 diabetes mellitus without complications: E11.9

## 2020-10-02 HISTORY — DX: Depression, unspecified: F32.A

## 2020-10-02 LAB — URINALYSIS, ROUTINE W REFLEX MICROSCOPIC
Bilirubin Urine: NEGATIVE
Glucose, UA: >=500 mg/dL — AB
Hgb urine dipstick: NEGATIVE
Ketones, ur: NEGATIVE mg/dL
Leukocytes,Ua: NEGATIVE
Nitrite: NEGATIVE
Protein, ur: NEGATIVE mg/dL
Specific Gravity, Urine: 1.025 (ref 1.005–1.030)
pH: 5 (ref 5.0–8.0)

## 2020-10-02 LAB — CBC
HCT: 46.9 % (ref 39.0–52.0)
Hemoglobin: 16.2 g/dL (ref 13.0–17.0)
MCH: 32.1 pg (ref 26.0–34.0)
MCHC: 34.5 g/dL (ref 30.0–36.0)
MCV: 93.1 fL (ref 80.0–100.0)
Platelets: 199 10*3/uL (ref 150–400)
RBC: 5.04 MIL/uL (ref 4.22–5.81)
RDW: 13.9 % (ref 11.5–15.5)
WBC: 7.3 10*3/uL (ref 4.0–10.5)
nRBC: 0 % (ref 0.0–0.2)

## 2020-10-02 LAB — COMPREHENSIVE METABOLIC PANEL
ALT: 44 U/L (ref 0–44)
AST: 23 U/L (ref 15–41)
Albumin: 4 g/dL (ref 3.5–5.0)
Alkaline Phosphatase: 83 U/L (ref 38–126)
Anion gap: 9 (ref 5–15)
BUN: 8 mg/dL (ref 6–20)
CO2: 21 mmol/L — ABNORMAL LOW (ref 22–32)
Calcium: 9 mg/dL (ref 8.9–10.3)
Chloride: 105 mmol/L (ref 98–111)
Creatinine, Ser: 1.11 mg/dL (ref 0.61–1.24)
GFR, Estimated: >60 mL/min (ref >60)
Glucose, Bld: 151 mg/dL — ABNORMAL HIGH (ref 70–99)
Potassium: 4.2 mmol/L (ref 3.5–5.1)
Sodium: 135 mmol/L (ref 135–145)
Total Bilirubin: 1 mg/dL (ref 0.3–1.2)
Total Protein: 6.4 g/dL — ABNORMAL LOW (ref 6.5–8.1)

## 2020-10-02 LAB — BLOOD GAS, ARTERIAL
Acid-base deficit: 0.7 mmol/L (ref 0.0–2.0)
Bicarbonate: 23.1 mmol/L (ref 20.0–28.0)
Drawn by: 58793
FIO2: 21
O2 Saturation: 97.8 %
Patient temperature: 37
pCO2 arterial: 36.1 mmHg (ref 32.0–48.0)
pH, Arterial: 7.423 (ref 7.350–7.450)
pO2, Arterial: 98.1 mmHg (ref 83.0–108.0)

## 2020-10-02 LAB — TYPE AND SCREEN
ABO/RH(D): A POS
ABO/RH(D): A POS
Antibody Screen: NEGATIVE
Antibody Screen: NEGATIVE

## 2020-10-02 LAB — SURGICAL PCR SCREEN
MRSA, PCR: NEGATIVE
Staphylococcus aureus: POSITIVE — AB

## 2020-10-02 LAB — GLUCOSE, CAPILLARY: Glucose-Capillary: 126 mg/dL — ABNORMAL HIGH (ref 70–99)

## 2020-10-02 LAB — SARS CORONAVIRUS 2 (TAT 6-24 HRS): SARS Coronavirus 2: NEGATIVE

## 2020-10-02 LAB — PROTIME-INR
INR: 1 (ref 0.8–1.2)
Prothrombin Time: 12.7 seconds (ref 11.4–15.2)

## 2020-10-02 LAB — APTT: aPTT: 28 seconds (ref 24–36)

## 2020-10-02 NOTE — Progress Notes (Signed)
Surgical Instructions    Your procedure is scheduled on Thursday, March 31st, 2022.  Report to [facility] Main Entrance "A" at 05:30 A.M., then check in with the Admitting office.  Call this number if you have problems the morning of surgery:  [phone]   If you have any questions prior to your surgery date call [phone]: Open Monday-Friday 8am-4pm    Remember:  Do not eat or drink after midnight the night before your surgery   Take these medicines the morning of surgery with A SIP OF WATER   atorvastatin (LIPITOR) buPROPion (WELLBUTRIN XL) cephALEXin (KEFLEX) cetirizine (ZYRTEC) citalopram (CELEXA) lamoTRIgine (LAMICTAL) metoprolol succinate (TOPROL-XL)  If needed  acetaminophen (TYLENOL) fluticasone (FLONASE)   WHAT DO I DO ABOUT MY DIABETES MEDICATION?  DO NOT TAKE Farxiga the day before surgery and the day of surgery.  DO NOT TAKE Ozempic the day of surgery STOP taking Metformin 2 days before surgery  HOW TO MANAGE YOUR DIABETES BEFORE AND AFTER SURGERY  Why is it important to control my blood sugar before and after surgery? . Improving blood sugar levels before and after surgery helps healing and can limit problems. . A way of improving blood sugar control is eating a healthy diet by: o  Eating less sugar and carbohydrates o  Increasing activity/exercise o  Talking with your doctor about reaching your blood sugar goals . High blood sugars (greater than 180 mg/dL) can raise your risk of infections and slow your recovery, so you will need to focus on controlling your diabetes during the weeks before surgery. . Make sure that the doctor who takes care of your diabetes knows about your planned surgery including the date and location.  How do I manage my blood sugar before surgery? . Check your blood sugar at least 4 times a day, starting 2 days before surgery, to make sure that the level is not too high or low. . Check your blood sugar the morning of your  surgery when you wake up and every 2 hours until you get to the Short Stay unit. o If your blood sugar is less than 70 mg/dL, you will need to treat for low blood sugar: - Do not take insulin. - Treat a low blood sugar (less than 70 mg/dL) with  cup of clear juice (cranberry or apple), 4 glucose tablets, OR glucose gel. - Recheck blood sugar in 15 minutes after treatment (to make sure it is greater than 70 mg/dL). If your blood sugar is not greater than 70 mg/dL on recheck, call [phone] for further instructions. . Report your blood sugar to the short stay nurse when you get to Short Stay.  . If you are admitted to the hospital after surgery: o Your blood sugar will be checked by the staff and you will probably be given insulin after surgery (instead of oral diabetes medicines) to make sure you have good blood sugar levels. o The goal for blood sugar control after surgery is 80-180 mg/dL.   Patient was instructed to follow his surgeon's instructions on when to stop Eliquis. If no instructions were given by his surgeon then he will need to call the office to get those instructions.   As of today, STOP taking any Aspirin (unless otherwise instructed by your surgeon) Aleve, Naproxen, Ibuprofen, Motrin, Advil, Goody's, BC's, all herbal medications, fish oil, and all vitamins.                     Do not wear  jewelry.            Do not wear lotions, powders, colognes, or deodorant.            Men may shave face and neck.            Do not bring valuables to the hospital.            [facility] is not responsible for any belongings or valuables.  Do NOT Smoke (Tobacco/Vaping) or drink Alcohol 24 hours prior to your procedure If you use a CPAP at night, you may bring all equipment for your overnight stay.   Contacts, glasses, dentures or bridgework may not be worn into surgery, please bring cases for these belongings   For patients admitted to the hospital, discharge time will be determined by  your treatment team.   Patients discharged the day of surgery will not be allowed to drive home, and someone needs to stay with them for 24 hours.    Special instructions:   [facility]- Preparing For Surgery  Before surgery, you can play an important role. Because skin is not sterile, your skin needs to be as free of germs as possible. You can reduce the number of germs on your skin by washing with CHG (chlorahexidine gluconate) Soap before surgery.  CHG is an antiseptic cleaner which kills germs and bonds with the skin to continue killing germs even after washing.    Oral Hygiene is also important to reduce your risk of infection.  Remember - BRUSH YOUR TEETH THE MORNING OF SURGERY WITH YOUR REGULAR TOOTHPASTE  Please do not use if you have an allergy to CHG or antibacterial soaps. If your skin becomes reddened/irritated stop using the CHG.  Do not shave (including legs and underarms) for at least 48 hours prior to first CHG shower. It is OK to shave your face.  Please follow these instructions carefully.   1. Shower the NIGHT BEFORE SURGERY and the MORNING OF SURGERY  2. If you chose to wash your hair, wash your hair first as usual with your normal shampoo.  3. After you shampoo, rinse your hair and body thoroughly to remove the shampoo.  4. Use CHG Soap as you would any other liquid soap. You can apply CHG directly to the skin and wash gently with a scrungie or a clean washcloth.   5. Apply the CHG Soap to your body ONLY FROM THE NECK DOWN.  Do not use on open wounds or open sores. Avoid contact with your eyes, ears, mouth and genitals (private parts). Wash Face and genitals (private parts)  with your normal soap.   6. Wash thoroughly, paying special attention to the area where your surgery will be performed.  7. Thoroughly rinse your body with warm water from the neck down.  8. DO NOT shower/wash with your normal soap after using and rinsing off the CHG Soap.  9. Pat yourself  dry with a CLEAN TOWEL.  10. Wear CLEAN PAJAMAS to bed the night before surgery  11. Place CLEAN SHEETS on your bed the night before your surgery  12. DO NOT SLEEP WITH PETS.   Day of Surgery:  Shower with CHG soap. Wear Clean/Comfortable clothing the morning of surgery Do not apply any deodorants/lotions.   Remember to brush your teeth WITH YOUR REGULAR TOOTHPASTE.   Please read over the following fact sheets that you were given.

## 2020-10-02 NOTE — Progress Notes (Signed)
Abnormal lab: urinalysis - bacteria rare; glucose > 500. The results were transmitted to Dr. Cyndia Bent office Levonne Spiller, RN was notified).

## 2020-10-02 NOTE — Progress Notes (Addendum)
PCP - Dr. Nicki Reaper, [facility] Cardiologist - Dr. Tamala Julian, [name]  PPM/ICD - denies Device Orders - denies Rep Notified - denies  Chest x-ray - 09/20/2020 EKG - 09/20/2020 Stress Test - denies ECHO - 09/06/2020 Cardiac Cath - 11/16/2019  Sleep Study - 07/02/2016 CPAP -  yes  Fasting Blood Sugar - 108 Checks Blood Sugar 2 times a day CBG in PAT 126 A1C : 6.1 - 09/20/2020  Blood Thinner Instructions: Patient stopped Eliquis 1 week before surgery per MD order. Aspirin Instructions: patient was instructed to STOP taking any Aspirin (unless otherwise instructed by your surgeon) Aleve, Naproxen, Ibuprofen, Motrin, Advil, Goody's, BC's, all herbal medications, fish oil, and all vitamins.  ERAS Protcol - no  COVID TEST- 10/02/2020   Anesthesia review: yes; open heart surgery; abnormal UA  Patient denies shortness of breath, fever, cough and chest pain at PAT appointment   All instructions explained to the patient, with a verbal understanding of the material. Patient agrees to go over the instructions while at home for a better understanding. Patient also instructed to self quarantine after being tested for COVID-19. The opportunity to ask questions was provided.

## 2020-10-03 ENCOUNTER — Encounter (HOSPITAL_COMMUNITY): Payer: Self-pay | Admitting: Surgery

## 2020-10-03 MED ORDER — SODIUM CHLORIDE 0.9 % IV SOLN
750.0000 mg | INTRAVENOUS | Status: AC
  Administered 2020-10-04: 750 mg via INTRAVENOUS
  Filled 2020-10-03: qty 750

## 2020-10-03 MED ORDER — NOREPINEPHRINE 4 MG/250ML-% IV SOLN
0.0000 ug/min | INTRAVENOUS | Status: DC
  Filled 2020-10-03: qty 250

## 2020-10-03 MED ORDER — DEXMEDETOMIDINE HCL IN NACL 400 MCG/100ML IV SOLN
0.1000 ug/kg/h | INTRAVENOUS | Status: AC
  Administered 2020-10-04: .3 ug/kg/h via INTRAVENOUS
  Filled 2020-10-03: qty 100

## 2020-10-03 MED ORDER — NITROGLYCERIN IN D5W 200-5 MCG/ML-% IV SOLN
2.0000 ug/min | INTRAVENOUS | Status: DC
  Filled 2020-10-03: qty 250

## 2020-10-03 MED ORDER — PLASMA-LYTE 148 IV SOLN
INTRAVENOUS | Status: DC
  Filled 2020-10-03: qty 2.5

## 2020-10-03 MED ORDER — SODIUM CHLORIDE 0.9 % IV SOLN
1.5000 g | INTRAVENOUS | Status: AC
  Administered 2020-10-04: 1.5 g via INTRAVENOUS
  Filled 2020-10-03: qty 1.5

## 2020-10-03 MED ORDER — VANCOMYCIN HCL 1500 MG/300ML IV SOLN
1500.0000 mg | INTRAVENOUS | Status: AC
  Administered 2020-10-04: 1500 mg via INTRAVENOUS
  Filled 2020-10-03: qty 300

## 2020-10-03 MED ORDER — INSULIN REGULAR(HUMAN) IN NACL 100-0.9 UT/100ML-% IV SOLN
INTRAVENOUS | Status: AC
  Administered 2020-10-04: 2.4 [IU]/h via INTRAVENOUS
  Filled 2020-10-03: qty 100

## 2020-10-03 MED ORDER — POTASSIUM CHLORIDE 2 MEQ/ML IV SOLN
80.0000 meq | INTRAVENOUS | Status: DC
  Filled 2020-10-03: qty 40

## 2020-10-03 MED ORDER — TRANEXAMIC ACID (OHS) BOLUS VIA INFUSION
15.0000 mg/kg | INTRAVENOUS | Status: AC
  Administered 2020-10-04: 1816.5 mg via INTRAVENOUS
  Filled 2020-10-03: qty 1817

## 2020-10-03 MED ORDER — MAGNESIUM SULFATE 50 % IJ SOLN
40.0000 meq | INTRAMUSCULAR | Status: DC
  Filled 2020-10-03: qty 9.85

## 2020-10-03 MED ORDER — TRANEXAMIC ACID 1000 MG/10ML IV SOLN
1.5000 mg/kg/h | INTRAVENOUS | Status: AC
  Administered 2020-10-04 (×2): 1.5 mg/kg/h via INTRAVENOUS
  Filled 2020-10-03: qty 25

## 2020-10-03 MED ORDER — PHENYLEPHRINE HCL-NACL 20-0.9 MG/250ML-% IV SOLN
30.0000 ug/min | INTRAVENOUS | Status: AC
  Administered 2020-10-04: 25 ug/min via INTRAVENOUS
  Filled 2020-10-03: qty 250

## 2020-10-03 MED ORDER — EPINEPHRINE HCL 5 MG/250ML IV SOLN IN NS
0.0000 ug/min | INTRAVENOUS | Status: DC
  Filled 2020-10-03: qty 250

## 2020-10-03 MED ORDER — SODIUM CHLORIDE 0.9 % IV SOLN
INTRAVENOUS | Status: DC
  Filled 2020-10-03: qty 30

## 2020-10-03 MED ORDER — TRANEXAMIC ACID (OHS) PUMP PRIME SOLUTION
2.0000 mg/kg | INTRAVENOUS | Status: DC
  Filled 2020-10-03: qty 2.42

## 2020-10-03 MED ORDER — MILRINONE LACTATE IN DEXTROSE 20-5 MG/100ML-% IV SOLN
0.3000 ug/kg/min | INTRAVENOUS | Status: DC
  Filled 2020-10-03: qty 100

## 2020-10-03 NOTE — Anesthesia Preprocedure Evaluation (Addendum)
Anesthesia Evaluation  Patient identified by MRN, date of birth, ID band Patient awake    Reviewed: Allergy & Precautions, NPO status , Patient's Chart, lab work & pertinent test results  Airway Mallampati: II  TM Distance: >3 FB Neck ROM: Full    Dental  (+) Teeth Intact   Pulmonary sleep apnea and Continuous Positive Airway Pressure Ventilation , former smoker,    Pulmonary exam normal        Cardiovascular hypertension, Pt. on medications and Pt. on home beta blockers + CAD  + dysrhythmias Atrial Fibrillation + Valvular Problems/Murmurs AS  Rhythm:Irregular Rate:Normal + Systolic murmurs    Neuro/Psych  Headaches, Anxiety Depression    GI/Hepatic negative GI ROS, Neg liver ROS,   Endo/Other  diabetes, Type 2  Renal/GU negative Renal ROS     Musculoskeletal  (+) Arthritis , Osteoarthritis,    Abdominal (+)  Abdomen: soft. Bowel sounds: normal.  Peds  Hematology negative hematology ROS (+)   Anesthesia Other Findings   Reproductive/Obstetrics                            Anesthesia Physical Anesthesia Plan  ASA: IV  Anesthesia Plan: General   Post-op Pain Management:    Induction: Intravenous  PONV Risk Score and Plan: 2 and Ondansetron, Midazolam and Treatment may vary due to age or medical condition  Airway Management Planned: Mask and Oral ETT  Additional Equipment: Arterial line, CVP, PA Cath, TEE, 3D TEE and Ultrasound Guidance Line Placement  Intra-op Plan:   Post-operative Plan: Post-operative intubation/ventilation  Informed Consent: I have reviewed the patients History and Physical, chart, labs and discussed the procedure including the risks, benefits and alternatives for the proposed anesthesia with the patient or authorized representative who has indicated his/her understanding and acceptance.     Dental advisory given  Plan Discussed with: CRNA  Anesthesia  Plan Comments: (CTA of the chest 02/02/2020 ascending aorta to be mildly enlarged at 3.6 cm with an aortic root diameter of 3.3 cm.  The proximal descending thoracic aorta was 3 cm.  ECHO 05/21: 1. Bicuspid aortic valve with fusion of the NCC/RCC with raphe (Sievers  type 1). There is severely restricted movement of the NCC/RCC leaflets.  The [place] moves without restriction. V max 2.8 m/s, MG 19 mmHG, AVA by VTI  1.30 cm2, DI 0.29. AVA by 3D MPR  planimetry is 1.48-1.68 cm2. AVA by planimetry will be slightly higher  than VTI method. Overall, there is moderate aortic stenosis. Regarding  regurgitation, the vena contracta is 0.40 cm by MPR assessment. 3D ERO is  0.21 cm2 consistent with moderate  aortic regurgitaiton. PHT is difficult to interpret in setting of atrial  fibrillation. In conclusion, there is combined moderate aortic stenosis  and moderate aortic regurgitation. The aortic valve is bicuspid. Aortic  valve regurgitation is moderate.  Moderate aortic valve stenosis. Aortic valve area, by VTI measures 1.30  cm. Aortic valve mean gradient measures 19.2 mmHg. Aortic valve Vmax  measures 2.84 m/s.  2. Left ventricular ejection fraction, by estimation, is 45 to 50%. Left  ventricular ejection fraction by 3D volume is 47 %. The left ventricle has  mildly decreased function. The left ventricle demonstrates global  hypokinesis.  3. Right ventricular systolic function is normal. The right ventricular  size is normal. There is normal pulmonary artery systolic pressure.  4. No left atrial/left atrial appendage thrombus was detected. The LAA  emptying velocity was  26 cm/s.  5. The mitral valve is normal in structure. Trivial mitral valve  regurgitation. No evidence of mitral stenosis.  6. There is mild (Grade II) plaque involving the descending aorta.   Cath 05/21:  Mild aortic stenosis with mean gradient 14 mmHg and calculated aortic valve area of 1.69 cm  Moderate aortic  regurgitation documented on both transthoracic and transesophageal echocardiography.  Mild pulmonary hypertension  Elevated left ventricular end-diastolic pressure 20 mmHg consistent with chronic combined systolic and diastolic heart failure.  Echo documentation of ejection fraction in the 45% range which has gradually developed over the past 3 years.  Normal left main  Luminal irregularities in LAD  Dominant right coronary, widely patent  Nondominant right coronary.   Lab Results      Component                Value               Date                      WBC                      7.3                 10/02/2020                HGB                      16.2                10/02/2020                HCT                      46.9                10/02/2020                MCV                      93.1                10/02/2020                PLT                      199                 10/02/2020           Lab Results      Component                Value               Date                      NA                       135                 10/02/2020                K                        4.2  10/02/2020                CO2                      21 (L)              10/02/2020                GLUCOSE                  151 (H)             10/02/2020                BUN                      8                   10/02/2020                CREATININE               1.11                10/02/2020                CALCIUM                  9.0                 10/02/2020                GFRNONAA                 >60                 10/02/2020                GFRAA                    92                  01/17/2020          )      Anesthesia Quick Evaluation

## 2020-10-03 NOTE — H&P (Signed)
[address]       [place] [address]             [phone]      Cardiothoracic Surgery Admission History and Physical   Referring Provider is [name], MD Primary Cardiologist is Sinclair Grooms, MD PCP is Einar Pheasant, MD      Chief Complaint  Patient presents with  . Aortic Stenosis        HPI:  The patient is a 55 year old gentleman with a history of hypertension, hyperlipidemia, type 2 DM, OSA on CPAP, history of SVT status post ablation in 2013, and bicuspid aortic valve disease with aortic stenosis.  An echocardiogram in September 2020 showed a bicuspid aortic valve with mild to moderate aortic insufficiency and a pressure half-time of 484 ms.  The mean gradient across aortic valve is 12.4 with a peak gradient of 21 and a valve area of 1.9 cm consistent with moderate aortic stenosis.  Left ventricular ejection fraction was 45 to 50%.  LV internal dimension during diastole was 6.2 cm and during systole 5.0 cm.  His most recent 2D echocardiogram on 09/23/2019 showed a mean gradient across aortic valve of 16 mmHg with a peak gradient of 27 mmHg.  Dimensionless index was 0.27 consistent with moderate aortic stenosis.  Aortic insufficiency appears mild with a pressure half-time of 512 ms.  Left ventricular ejection fraction was 40 to 45%.  LV internal dimensions were 5.6 cm in diastole and 4.3 cm in systole.  On 11/16/2019 he underwent TEE which showed a bicuspid aortic valve with fusion of the non and right coronary cusp with a single raphae.  There was severely restricted movement of the conjoined leaflets.  The left coronary cusp was moving without restriction.  The mean gradient was 19 mmHg with an aortic valve area of 1.3 cm and dimensionless index of 0.29.  Aortic valve area by planimetry was 1.48 to 1.68 cm.  There was felt to be moderate aortic insufficiency.  Left ventricular ejection fraction was 45 to 50%.  He underwent cardiac  catheterization on the same day which showed a mean gradient across aortic valve of 40 mmHg with a calculated valve area of 1.69 cm.  There was mild pulmonary hypertension at 34/18 with a mean wedge pressure of 19.  LVEDP was 21 mmHg.  There was no significant coronary disease. He had a follow-up echo on 09/06/2020 which I have reviewed.  It is a poor quality study but essentially the same as his prior echo.  This shows a bicuspid aortic valve with heavily calcified leaflets with restricted mobility.  The mean gradient measured across the valve was 18.2 mmHg.  There is moderate aortic insufficiency.  Left ventricular ejection fraction still looks in the 45 to 50% range to me although it was read as 60 to 65% in the echo report.  CTA of the chest done 02/02/2020 had shown the ascending aorta to be mildly enlarged at 3.6 cm with an aortic root diameter of 3.3 cm.  The proximal descending thoracic aorta was 3 cm.  He continues to work full-time for [employer] and has a fairly strenuous job.  He does get some shortness of breath with moderate exertion and gets fatigued but is able to stop and rest and continue on.  He denies any chest pain or pressure.  He has had no orthopnea.  He denies peripheral edema.  He has had chronic atrial fibrillation for years  and is on Eliquis.  Past Medical History:  Diagnosis Date  . Aortic valve stenosis    a. Bicuspid AV - 2D Echo 06/2014 - mod AS, mild AI, mildly dilated aortic root.  [name] CAD (coronary artery disease)   . Carotid artery disease ([place])    a. Mild by duplex 05/2015 - 1-39% BICA. Repeat due 05/2017.  . Dilated aortic root ([place])    a. By echo 06/2014.  . Environmental allergies   . Hypercholesterolemia   . Hypertension   . Obesity (BMI 30-39.9) 04/26/2018  . Sleep apnea   . SVT (supraventricular tachycardia) ([place])    a. h/o SVT - Ablation done 2013.         Past Surgical History:  Procedure Laterality Date  . CARDIAC ELECTROPHYSIOLOGY STUDY  AND ABLATION  08/21/2011  . COLONOSCOPY WITH PROPOFOL N/A 06/26/2017   Procedure: COLONOSCOPY WITH PROPOFOL;  Surgeon: Manya Silvas, MD;  Location: [facility] ENDOSCOPY;  Service: Endoscopy;  Laterality: N/A;  . ESOPHAGOGASTRODUODENOSCOPY (EGD) WITH PROPOFOL N/A 06/26/2017   Procedure: ESOPHAGOGASTRODUODENOSCOPY (EGD) WITH PROPOFOL;  Surgeon: Manya Silvas, MD;  Location: [facility] ENDOSCOPY;  Service: Endoscopy;  Laterality: N/A;  . RIGHT/LEFT HEART CATH AND CORONARY ANGIOGRAPHY N/A 11/16/2019   Procedure: RIGHT/LEFT HEART CATH AND CORONARY ANGIOGRAPHY;  Surgeon: [name], MD;  Location: [facility] CV LAB;  Service: Cardiovascular;  Laterality: N/A;  . SEPTOPLASTY N/A 03/22/2015   Procedure: SEPTOPLASTY  WITH RIGHT INFERIOR TURBINATE REDUCTION;  Surgeon: Margaretha Sheffield, MD;  Location: [facility];  Service: ENT;  Laterality: N/A;  . SUPRAVENTRICULAR TACHYCARDIA ABLATION N/A 08/21/2011   Procedure: SUPRAVENTRICULAR TACHYCARDIA ABLATION;  Surgeon: Evans Lance, MD;  Location: [facility] CATH LAB;  Service: Cardiovascular;  Laterality: N/A;  . surgery for non descending testicle    . TEE WITHOUT CARDIOVERSION N/A 11/16/2019   Procedure: TRANSESOPHAGEAL ECHOCARDIOGRAM (TEE);  Surgeon: Geralynn Rile, MD;  Location: [facility];  Service: Cardiovascular;  Laterality: N/A;  . TONSILLECTOMY           Family History  Problem Relation Age of Onset  . Diabetes Other   . Cancer Other   . Breast cancer Maternal Grandmother   . Cancer Maternal Grandfather   . Aortic stenosis Maternal Grandfather     Social History        Socioeconomic History  . Marital status: Married    Spouse name: Not on file  . Number of children: 2  . Years of education: Not on file  . Highest education level: Not on file  Occupational History  . Occupation: truck Education administrator: Insurance account manager  Tobacco Use  . Smoking status: Former Smoker    Types: Cigarettes    Quit date:  11/04/1988    Years since quitting: 31.2  . Smokeless tobacco: Never Used  Vaping Use  . Vaping Use: Never used  Substance and Sexual Activity  . Alcohol use: Yes    Alcohol/week: 8.0 standard drinks    Types: 7 Glasses of wine, 1 Shots of liquor per week    Comment: occasionally  . Drug use: Not Currently    Comment: many years ago in highschool  . Sexual activity: Not Currently  Other Topics Concern  . Not on file  Social History Narrative   Very rare exercise   Social Determinants of Health      Financial Resource Strain:   . Difficulty of Paying Living Expenses:   Food Insecurity:   . Worried About Running  Out of Food in the Last Year:   . [place] in the Last Year:   Transportation Needs:   . Lack of Transportation (Medical):   [name] Lack of Transportation (Non-Medical):   Physical Activity:   . Days of Exercise per Week:   . Minutes of Exercise per Session:   Stress:   . Feeling of Stress :   Social Connections:   . Frequency of Communication with Friends and Family:   . Frequency of Social Gatherings with Friends and Family:   . Attends Religious Services:   . Active Member of Clubs or Organizations:   . Attends Archivist Meetings:   [name] Marital Status:   Intimate Partner Violence:   . Fear of Current or Ex-Partner:   . Emotionally Abused:   [name] Physically Abused:   . Sexually Abused:           Current Outpatient Medications  Medication Sig Dispense Refill  . acetaminophen (TYLENOL) 500 MG tablet Take 500-1,000 mg by mouth every 6 (six) hours as needed (for pain.).    [name] apixaban (ELIQUIS) 5 MG TABS tablet Take 1 tablet (5 mg total) by mouth 2 (two) times daily. 180 tablet 1  . atorvastatin (LIPITOR) 40 MG tablet Take 1 tablet (40 mg total) by mouth daily. 30 tablet 2  . cetirizine (ZYRTEC) 10 MG tablet Take 10 mg by mouth 2 (two) times daily.     . citalopram (CELEXA) 20 MG tablet Take 20 mg by mouth daily.    . fluticasone  (FLONASE) 50 MCG/ACT nasal spray Place 2 sprays into both nostrils daily as needed for allergies or rhinitis.    [name] glucose blood (ONETOUCH VERIO) test strip USE TWICE DAILY AS DIRECTED 50 strip 31  . Lancets (ONETOUCH DELICA PLUS [id number]) MISC USE TWICE DAILY AS DIRECTED 100 each 7  . lisinopril (ZESTRIL) 20 MG tablet Take 20 mg by mouth daily.    . metFORMIN (GLUCOPHAGE) 1000 MG tablet TAKE 1 TABLET BY MOUTH TWICE A DAY 180 tablet 1  . metoprolol succinate (TOPROL-XL) 50 MG 24 hr tablet Take 1 tablet (50 mg total) by mouth daily. 30 tablet 11  . NON FORMULARY as directed. CPAP    . spironolactone (ALDACTONE) 25 MG tablet Take 0.5 tablets (12.5 mg total) by mouth daily. 45 tablet 3  . traZODone (DESYREL) 50 MG tablet Take 50 mg by mouth at bedtime.      No current facility-administered medications for this visit.    No Known Allergies    Review of Systems:              General:                      normal appetite, + decreased energy, no weight gain, no weight loss, no fever             Cardiac:                       no chest pain with exertion, no chest pain at rest, +SOB with moderate exertion, no resting SOB, no PND, no orthopnea, no palpitations, + arrhythmia, + atrial fibrillation, no LE edema, no dizzy spells, no syncope             Respiratory:                 + exertional shortness of breath, no home oxygen, no productive cough, no  dry cough, no bronchitis, no wheezing, no hemoptysis, no asthma, no pain with inspiration or cough, + sleep apnea, + CPAP at night             GI:                               no difficulty swallowing, no reflux, no frequent heartburn, no hiatal hernia, no abdominal pain, no constipation, no diarrhea, no hematochezia, no hematemesis, no melena             GU:                              no dysuria,  no frequency, no urinary tract infection, no hematuria, no enlarged prostate, no kidney stones, no kidney disease             Vascular:                      no pain suggestive of claudication, no pain in feet, no leg cramps, no varicose veins, no DVT, no non-healing foot ulcer             Neuro:                         no stroke, no TIA's, no seizures, no headaches, no temporary blindness one eye,  no slurred speech, no peripheral neuropathy, no chronic pain, no instability of gait, no memory/cognitive dysfunction             Musculoskeletal:         no arthritis, no joint swelling, no myalgias, no difficulty walking, normal mobility              Skin:                            no rash, no itching, no skin infections, no pressure sores or ulcerations             Psych:                         + anxiety, + PTSD, no depression, no nervousness, no unusual recent stress             Eyes:                           no blurry vision, no floaters, no recent vision changes, does not wear glasses or contacts             ENT:                            + hearing loss, no loose or painful teeth, no dentures, last saw dentist before Covid. Scheduled for visit soon.             Hematologic:               no easy bruising, no abnormal bleeding, no clotting disorder, no frequent epistaxis             Endocrine:                   + diabetes, does  check CBG's at home  Physical Exam:              BP 111/80   Pulse 100   Resp 20   Ht 6\' 1"  (1.854 m)   Wt 275 lb (124.7 kg)   SpO2 95% Comment: RA  BMI 36.28 kg/m              General:                      Large frame, obese by BMI,  well-appearing             HEENT:                       Unremarkable, NCAT, PERLA, EOMI             Neck:                           no JVD, no bruits, no adenopathy              Chest:                          clear to auscultation, symmetrical breath sounds, no wheezes, no rhonchi              CV:                              IRRR, grade lll/VI crescendo/decrescendo murmur heard best at RSB, 2/6  diastolic murmur  left sternal border             Abdomen:                    soft, non-tender, no masses              Extremities:                 warm, well-perfused, pulses palpable at ankle, no LE edema             Rectal/GU                   Deferred             Neuro:                         Grossly non-focal and symmetrical throughout             Skin:                            Clean and dry, no rashes, no breakdown   Diagnostic Tests:  ECHOCARDIOGRAM REPORT       Patient Name:  Dalton [name] Borneman Date of Exam: 09/23/2019  Medical Rec #: [id number]     Height:    73.0 in  Accession #:  [id number]    Weight:    272.8 lb  Date of Birth: [birth year]     BSA:     2.455 m  Patient Age:  37 years     BP:      128/77 mmHg  Patient Gender: M         HR:      84 bpm.  Exam Location: [address]   Procedure: 2D Echo, Cardiac Doppler, Color Doppler  and Intracardiac       Opacification Agent   Indications:  I35.0    History:    Patient has prior history of Echocardiogram examinations,  most         recent 03/25/2019. LVH, AS. Bicuspid aortic valve.,         Arrythmias:Atrial Fibrillation; Risk Factors:Former  Smoker,         Hypertension, Diabetes, Dyslipidemia and Obesity.    Sonographer:  Coralyn Helling RDCS  Referring Phys: [place]     Sonographer Comments: Patient is morbidly obese. Image acquisition  challenging due to patient body habitus.  IMPRESSIONS    1. Compared to echo from Sept 2020, there is no significant change.  2. Poor acoustic windows limit study Consider Definity to further define.  3. Left ventricular ejection fraction, by estimation, is 40 to 45%. The  left ventricle has mildly decreased function. The left ventricle  demonstrates global hypokinesis. There is mild left ventricular  hypertrophy. Left ventricular diastolic parameters  are indeterminate.   4. Right ventricular systolic function is mildly reduced. The right  ventricular size is normal.  5. The mitral valve is abnormal. Trivial mitral valve regurgitation.  6. AV is difficult to see. Peak and mean gradients through the valve are  27 and 16 mm Hg respectively LVO/AV VTI ratio is 0.27 consistent with  moderate AS. Consider TEE to further define. .. Aortic valve regurgitation  is mild. no significant change from  prevoius echo  7. The inferior vena cava is normal in size with greater than 50%  respiratory variability, suggesting right atrial pressure of 3 mmHg.   FINDINGS  Left Ventricle: Left ventricular ejection fraction, by estimation, is 40  to 45%. The left ventricle has mildly decreased function. The left  ventricle demonstrates global hypokinesis. Definity contrast agent was  given IV to delineate the left ventricular  endocardial borders. The left ventricular internal cavity size was small.  There is mild left ventricular hypertrophy. Left ventricular diastolic  parameters are indeterminate.   Right Ventricle: The right ventricular size is normal. No increase in  right ventricular wall thickness. Right ventricular systolic function is  mildly reduced.   Left Atrium: Left atrial size was normal in size.   Right Atrium: Right atrial size was normal in size.   Pericardium: There is no evidence of pericardial effusion.   Mitral Valve: The mitral valve is abnormal. There is mild thickening of  the mitral valve leaflet(s). Trivial mitral valve regurgitation.   Tricuspid Valve: The tricuspid valve is normal in structure. Tricuspid  valve regurgitation is trivial.   Aortic Valve: AV is difficult to see. Peak and mean gradients through the  valve are 27 and 16 mm Hg respectively LVO/AV VTI ratio is 0.27 consistent  with moderate AS. Consider TEE to further define. The aortic valve was not  well visualized. Aortic valve  regurgitation is mild. Aortic  regurgitation PHT measures 512 msec. Aortic  valve mean gradient measures 14.8 mmHg. Aortic valve peak gradient  measures 24.7 mmHg.   Pulmonic Valve: The pulmonic valve was not well visualized. Pulmonic valve  regurgitation is not visualized.   Aorta: The aortic root is normal in size and structure.   Venous: The inferior vena cava is normal in size with greater than 50%  respiratory variability, suggesting right atrial pressure of 3 mmHg.   IAS/Shunts: No atrial level shunt detected by color flow Doppler.     LEFT VENTRICLE  PLAX 2D  LVIDd:  5.60 cm  LVIDs:     4.30 cm  LV PW:     1.30 cm  LV IVS:    1.30 cm     IVC  IVC diam: 1.30 cm   LEFT ATRIUM      Index    RIGHT ATRIUM      Index  LA diam:   4.00 cm 1.63 cm/m RA Pressure: 3.00 mmHg  LA Vol (A4C): 72.7 ml 29.61 ml/m RA Area:   28.60 cm                  RA Volume:  104.00 ml 42.36 ml/m  AORTIC VALVE  AV Vmax:      248.60 cm/s  AV Vmean:     178.200 cm/s  AV VTI:      0.482 m  AV Peak Grad:   24.7 mmHg  AV Mean Grad:   14.8 mmHg  LVOT Vmax:     81.96 cm/s  LVOT Vmean:    52.720 cm/s  LVOT VTI:     0.130 m  LVOT/AV VTI ratio: 0.27  AI PHT:      512 msec    AORTA  Ao Root diam: 3.60 cm  Ao Asc diam: 3.90 cm   MV E velocity: 86.98 cm/s TRICUSPID VALVE               Estimated RAP: 3.00 mmHg                 SHUNTS               Systemic VTI: 0.13 m   Dorris Carnes MD  Electronically signed by Dorris Carnes MD  Signature Date/Time: 09/24/2019/10:59:08 PM    TRANSESOPHOGEAL ECHO REPORT       Patient Name:  NAYEF COLLEGE Natal Date of Exam: 11/16/2019  Medical Rec #: [id number]     Height:    73.0 in  Accession #:  [id number]    Weight:    270.0 lb  Date of Birth: 1966-05-02     BSA:     2.444 m  Patient Age:  69 years     BP:       133/83 mmHg  Patient Gender: M         HR:      87 bpm.  Exam Location: Outpatient   Procedure: 3D Echo, Transesophageal Echo, Color Doppler and Cardiac  Doppler   Indications:   aortic valve disease    History:     Patient has prior history of Echocardiogram examinations,  most          recent 09/23/2019.    Sonographer:   Johny Chess  Referring Phys: [place]  Diagnosing Phys: Eleonore Chiquito MD   PROCEDURE: After discussion of the risks and benefits of a TEE, an  informed consent was obtained from the patient. TEE procedure time was 22  minutes. The transesophogeal probe was passed without difficulty through  the esophogus of the patient. Imaged  were obtained with the patient in a left lateral decubitus position. Local  oropharyngeal anesthetic was provided with Benzocaine spray. Sedation  performed by different physician. The patient was monitored while under  deep sedation. Anesthestetic sedation  was provided intravenously by Anesthesiology: 317mg  of Propofol. Image  quality was excellent. The patient's vital signs; including heart rate,  blood pressure, and oxygen saturation; remained stable throughout the  procedure. The patient developed no  complications during the procedure.   IMPRESSIONS  1. Bicuspid aortic valve with fusion of the NCC/RCC with raphe (Sievers  type 1). There is severely restricted movement of the NCC/RCC leaflets.  The [place] moves without restriction. V max 2.8 m/s, MG 19 mmHG, AVA by VTI  1.30 cm2, DI 0.29. AVA by 3D MPR  planimetry is 1.48-1.68 cm2. AVA by planimetry will be slightly higher  than VTI method. Overall, there is moderate aortic stenosis. Regarding  regurgitation, the vena contracta is 0.40 cm by MPR assessment. 3D ERO is  0.21 cm2 consistent with moderate  aortic regurgitaiton. PHT is difficult to interpret in setting of atrial  fibrillation. In conclusion, there is  combined moderate aortic stenosis  and moderate aortic regurgitation. The aortic valve is bicuspid. Aortic  valve regurgitation is moderate.  Moderate aortic valve stenosis. Aortic valve area, by VTI measures 1.30  cm. Aortic valve mean gradient measures 19.2 mmHg. Aortic valve Vmax  measures 2.84 m/s.  2. Left ventricular ejection fraction, by estimation, is 45 to 50%. Left  ventricular ejection fraction by 3D volume is 47 %. The left ventricle has  mildly decreased function. The left ventricle demonstrates global  hypokinesis.  3. Right ventricular systolic function is normal. The right ventricular  size is normal. There is normal pulmonary artery systolic pressure.  4. No left atrial/left atrial appendage thrombus was detected. The LAA  emptying velocity was 26 cm/s.  5. The mitral valve is normal in structure. Trivial mitral valve  regurgitation. No evidence of mitral stenosis.  6. There is mild (Grade II) plaque involving the descending aorta.   FINDINGS  Left Ventricle: Left ventricular ejection fraction, by estimation, is 45  to 50%. Left ventricular ejection fraction by 3D volume is 47 %. The left  ventricle has mildly decreased function. The left ventricle demonstrates  global hypokinesis. The left  ventricular internal cavity size was normal in size. There is no left  ventricular hypertrophy.   Right Ventricle: The right ventricular size is normal. No increase in  right ventricular wall thickness. Right ventricular systolic function is  normal. There is normal pulmonary artery systolic pressure. The tricuspid  regurgitant velocity is 1.97 m/s, and  with an assumed right atrial pressure of 0 mmHg, the estimated right  ventricular systolic pressure is 62.1 mmHg.   Left Atrium: Left atrial size was normal in size. No left atrial/left  atrial appendage thrombus was detected. The LAA emptying velocity was 26  cm/s.   Right Atrium: Right atrial size was normal in  size.   Pericardium: Trivial pericardial effusion is present. The pericardial  effusion is circumferential.   Mitral Valve: The mitral valve is normal in structure. Trivial mitral  valve regurgitation. No evidence of mitral valve stenosis.   Tricuspid Valve: The tricuspid valve is normal in structure. Tricuspid  valve regurgitation is mild . No evidence of tricuspid stenosis.   Aortic Valve: Bicuspid aortic valve with fusion of the NCC/RCC with raphe  (Sievers type 1). There is severely restricted movement of the NCC/RCC  leaflets. The [place] moves without restriction. V max 2.8 m/s, MG 19 mmHG,  AVA by VTI 1.30 cm2, DI 0.29. AVA by  3D MPR planimetry is 1.48-1.68 cm2. AVA by planimetry will be slightly  higher than VTI method. Overall, there is moderate aortic stenosis.  Regarding regurgitation, the vena contracta is 0.40 cm by MPR assessment.  3D ERO is 0.21 cm2 consistent with  moderate aortic regurgitaiton. PHT is difficult to interpret in setting of  atrial fibrillation. In conclusion, there  is combined moderate aortic  stenosis and moderate aortic regurgitation. The aortic valve is bicuspid.  Aortic valve regurgitation is  moderate. Aortic regurgitation PHT measures 1055 msec. Moderate aortic  stenosis is present. Aortic valve mean gradient measures 19.2 mmHg. Aortic  valve peak gradient measures 32.1 mmHg. Aortic valve area, by VTI measures  1.30 cm.   Pulmonic Valve: The pulmonic valve was normal in structure. Pulmonic valve  regurgitation is not visualized. No evidence of pulmonic stenosis.   Aorta: The aortic root and ascending aorta are structurally normal, with  no evidence of dilitation. There is mild (Grade II) plaque involving the  descending aorta.   Venous: The left upper pulmonary vein and right upper pulmonary vein are  normal.   IAS/Shunts: No atrial level shunt detected by color flow Doppler.   EKG: Rhythm strip during this exam demostrated atrial  fibrillation.     LEFT VENTRICLE  PLAX 2D  LVOT diam:   2.40 cm  LV SV:     81  LV SV Index:  33       3D Volume EF  LVOT Area:   4.52 cm    LV 3D EF:  Left                       ventricular                       ejection                       fraction by                       3D volume                       is 47 %.                 LV 3D EDV:  115.54 ml                 LV 3D ESV:  60.34 ml                   3D Volume EF:                 3D EF:    47 %   AORTIC VALVE  AV Area (Vmax):  1.34 cm  AV Area (Vmean):  1.31 cm  AV Area (VTI):   1.30 cm  AV Vmax:      283.50 cm/s  AV Vmean:     206.500 cm/s  AV VTI:      0.624 m  AV Peak Grad:   32.1 mmHg  AV Mean Grad:   19.2 mmHg  LVOT Vmax:     83.82 cm/s  LVOT Vmean:    59.825 cm/s  LVOT VTI:     0.179 m  LVOT/AV VTI ratio: 0.29  AI PHT:      1055 msec    AORTA  Ao Root diam: 3.30 cm  Ao Asc diam: 3.60 cm   TRICUSPID VALVE  TR Peak grad:  15.5 mmHg  TR Vmax:    197.00 cm/s    SHUNTS  Systemic VTI: 0.18 m  Systemic Diam: 2.40 cm   Eleonore Chiquito MD  Electronically signed by Eleonore Chiquito MD  Signature Date/Time: 11/16/2019/1:00:36 PM     Physicians  Panel Physicians Referring Physician Case  [place]  [name], MD (Primary)    Procedures  RIGHT/LEFT HEART CATH AND CORONARY ANGIOGRAPHY  Conclusion   Mild aortic stenosis with mean gradient 14 mmHg and calculated aortic valve area of 1.69 cm  Moderate aortic regurgitation documented on both transthoracic and transesophageal echocardiography.  Mild pulmonary hypertension  Elevated left ventricular end-diastolic pressure 20 mmHg consistent with chronic combined systolic and  diastolic heart failure. Echo documentation of ejection fraction in the 45% range which has gradually developed over the past 3 years.  Normal left main  Luminal irregularities in LAD  Dominant right coronary, widely patent  Nondominant right coronary.  RECOMMENDATIONS:  Does not appear that the aortic valve is severe enough to cause decreased LV function.  Suspect primary myocardial process plus/minus impact of atrial fibrillation rate control on LV function. We will uptitrate Toprol-XL to 50 mg/day; increase lisinopril to 20 mg/day; remove HCTZ to allow blood pressure that will sustain up titration of guideline directed therapy. Consider adding MRA if diuretic needed  Other considerations for decreased LV function could be related to sleep apnea (discussed with Dr. Radford Pax and patient's most recent download indicates excellent therapy); infiltrative process such as amyloid; other. May ultimately need heart failure clinic consultation.  Will not need referral to the valve clinic at this time. Recommendations  Antiplatelet/Anticoag Recommend to resume Apixaban, at currently prescribed dose and frequency. Concurrent antiplatelet therapy not recommended.  Surgeon Notes    11/16/2019 10:48 AM CV Procedure signed by Geralynn Rile, MD  Indications  Chronic systolic heart failure ([place]) [I50.22 (ICD-10-CM)]  Chronic atrial fibrillation ([place]) [I48.20 (ICD-10-CM)]  Aortic stenosis with bicuspid valve [Q23.0, Q23.1 (ICD-10-CM)]  Procedural Details  Technical Details The right radial area was sterilely prepped and draped. Intravenous sedation with Versed and fentanyl was administered. 1% Xylocaine was infiltrated to achieve local analgesia. Using real-time vascular ultrasound, a double wall stick with an angiocath was utilized to obtain intra-arterial access. A VUS image was saved for the permanent record.The modified Seldinger technique was used to place a 73F " Slender" sheath  in the right radial artery. Weight based heparin was administered. Coronary angiography was done using 5 F catheters. Right coronary angiography was performed with a JR4. Left ventricular hemodymic recordings and angiography was done using the JR 4 catheter and hand injection. Left coronary angiography was performed with a JL 3.5 cm.  Right heart catheterization was performed by exchanging a previously placed antecubital IV angio-cath for a 5 French Slender sheath. 1% Xylocaine was used to locally nesthetize the area around the IV site. The IV catheter was wired using an .018 guidewire. The modified Seldinger technique was used to place the 5 [place] sheath. Double glove technique was used to enhance sterility. After sheath insertion, right heart cath was performed using a 5 French balloon tipped catheter and fluoroscopic guidance. Pressures were recorded in each chamber and in the pulmonary capillary wedge position.. The main pulmonary artery O2 saturation was sampled.   Hemostasis was achieved using a pneumatic band.  During this procedure the patient is administered a total of Versed 0 mg and Fentanyl 0 mcg. The patient came from the endoscopy suite where he received 2 mg of Versed, 50 mcg of fentanyl, and propofol to achieve and maintain moderate conscious sedation. The patient still had significant sedation effect from the prior procedure. The patient's heart rate, blood pressure, and oxygen saturation are monitored continuously during the procedure. The period of conscious sedation is 25 minutes, of which I  was present face-to-face 100% of this time. Estimated blood loss <50 mL.   During this procedure medications were administered to achieve and maintain moderate conscious sedation while the patient's heart rate, blood pressure, and oxygen saturation were continuously monitored and I was present face-to-face 100% of this time.  Medications (Filter: Administrations occurring from 1040 to 1155 on  11/16/19) (important) Continuous medications are totaled by the amount administered until 11/16/19 1155.  lidocaine (PF) (XYLOCAINE) 1 % injection (mL) Total volume:  4 mL Date/Time  Rate/Dose/Volume Action  11/16/19 1120  2 mL Given  1124  2 mL Given    Radial Cocktail/Verapamil only (mL) Total volume:  10 mL Date/Time  Rate/Dose/Volume Action  11/16/19 1126  10 mL Given    Heparin (Porcine) in NaCl 1000-0.9 UT/500ML-% SOLN (mL) Total volume:  1,000 mL Date/Time  Rate/Dose/Volume Action  11/16/19 1126  500 mL Given  1126  500 mL Given    heparin sodium (porcine) injection (Units) Total dose:  6,000 Units Date/Time  Rate/Dose/Volume Action  11/16/19 1135  6,000 Units Given    iohexol (OMNIPAQUE) 350 MG/ML injection (mL) Total volume:  50 mL Date/Time  Rate/Dose/Volume Action  11/16/19 1147  50 mL Given    Contrast  Medication Name Total Dose  iohexol (OMNIPAQUE) 350 MG/ML injection 50 mL    Radiation/Fluoro  Fluoro time: 5.4 (min) DAP: 25178 (mGycm2) Cumulative Air Kerma: 459 (mGy)  Coronary Findings  Diagnostic Dominance: Left Left Anterior Descending  Prox LAD to Mid LAD lesion is 20% stenosed.  Left Circumflex  First Obtuse Marginal Branch  Vessel is small in size.  Second Obtuse Marginal Branch  Vessel is small in size.  Third Obtuse Marginal Branch  Vessel is large in size.  Left Posterior Descending Artery  Vessel is moderate in size.  First Left Posterolateral Branch  Vessel is moderate in size.  Second Left Posterolateral Branch  Vessel is small in size.  Intervention  No interventions have been documented. Right Heart  Right Heart Pressures Hemodynamic findings consistent with mild pulmonary hypertension. Elevated LV EDP consistent with volume overload.  Right Atrium Right atrial pressure is elevated.  Left Heart  Left Ventricle The left ventricle is mildly dilated. There is mild left ventricular systolic dysfunction. LV  end diastolic pressure is moderately elevated. The left ventricular ejection fraction is 35-45% by visual estimate. There are LV function abnormalities due to global hypokinesis. Based on Echo results.  Coronary Diagrams  Diagnostic Dominance: Left  Intervention  Implants      No implant documentation for this case.  Syngo Images  Show images for CARDIAC CATHETERIZATION Images on Long Term Storage  Show images for [place], [place] "[name]" Link to Procedure Log  Procedure Log    Hemo Data   Most Recent Value  Fick Cardiac Output 5.06 L/min  Fick Cardiac Output Index 2.06 (L/min)/BSA  RA A Wave 16 mmHg  RA V Wave 16 mmHg  RA Mean 15 mmHg  RV Systolic Pressure 36 mmHg  RV Diastolic Pressure 9 mmHg  RV EDP 15 mmHg  PA Systolic Pressure 34 mmHg  PA Diastolic Pressure 18 mmHg  PA Mean 26 mmHg  PW A Wave 23 mmHg  PW V Wave 30 mmHg  PW Mean 19 mmHg  AO Systolic Pressure 245 mmHg  AO Diastolic Pressure 94 mmHg  AO Mean 809 mmHg  LV Systolic Pressure 983 mmHg  LV Diastolic Pressure 17 mmHg  LV EDP 21 mmHg  AOp Systolic Pressure 382 mmHg  AOp Diastolic Pressure 86 mmHg  AOp Mean Pressure 510 mmHg  LVp Systolic Pressure 258 mmHg  LVp Diastolic Pressure 16 mmHg  LVp EDP Pressure 20 mmHg  QP/QS 1  TPVR Index 14.53 HRUI  TSVR Index 55.17 HRUI  PVR SVR Ratio 0.11  TPVR/TSVR Ratio 0.26    Impression:  I suspect this gentleman has symptomatic combined moderate aortic stenosis and aortic insufficiency with a bicuspid aortic valve.  He has New York Heart Association class II symptoms of exertional fatigue and shortness of breath.  He has had a progressive decrease in his left ventricular ejection fraction since 2015 when it was 60 to 65%.  At that time he actually had a higher mean gradient across the aortic valve of 25 mmHg and normal left ventricular internal dimensions.  Since then his ejection fraction has continued to fall to 40% with increased left  ventricular internal dimensions.  His diastolic dimension in September 2020 was measured at 6 cm and it was 5.6 cm by echo in March 2021.  He has decreased left ventricular systolic function may be due to the combination of aortic stenosis and insufficiency over time as well as the effects of atrial fibrillation and OSA.  I think it may be best to proceed with aortic valve replacement before his left ventricle continues to deteriorate further.  This should improve his symptoms and prevent progressive left ventricular deterioration.  The gradient across the aortic valve may be lower than expected due to his reduced ejection fraction.  CT scan of the chest showed the ascending aorta to be minimally enlarged at 3.6 cm with an aortic root diameter 3.3 cm.  This does not require replacement. It was noted to be around 4 cm on echo.  I think he may also benefit from a Maze procedure to try to keep him in sinus rhythm.  I do not think he would be a candidate for transcatheter aortic valve replacement given his young age and bicuspid valve morphology.  I reviewed the echocardiogram images and measurements with him and his wife and my recommendation to proceed with open surgical aortic valve replacement and Maze procedure.  I discussed the options of mechanical and bioprosthetic valves.  We have decided to use a bioprosthetic valve so that he does not need to be on lifelong Coumadin. I discussed the operative procedure with the patient and his wife including alternatives, benefits and risks; including but not limited to bleeding, blood transfusion, infection, stroke, myocardial infarction, graft failure, heart block requiring a permanent pacemaker, organ dysfunction, and death.    Plan:  Aortic valve replacement using a bioprosthetic valve and Maze procedure.   Gaye Pollack, MD

## 2020-10-04 ENCOUNTER — Encounter (HOSPITAL_COMMUNITY)
Admission: RE | Disposition: A | Discharge status: Home / Self Care | Payer: Self-pay | Source: Home / Self Care | Attending: Surgery

## 2020-10-04 ENCOUNTER — Other Ambulatory Visit: Payer: Self-pay

## 2020-10-04 ENCOUNTER — Other Ambulatory Visit: Payer: Self-pay | Admitting: Medical

## 2020-10-04 ENCOUNTER — Inpatient Hospital Stay (HOSPITAL_COMMUNITY): Payer: 59 | Admitting: Physician Assistant

## 2020-10-04 ENCOUNTER — Inpatient Hospital Stay (HOSPITAL_COMMUNITY): Payer: 59

## 2020-10-04 ENCOUNTER — Inpatient Hospital Stay (HOSPITAL_COMMUNITY)
Admission: RE | Admit: 2020-10-04 | Discharge: 2020-10-10 | DRG: 220 | Disposition: A | Discharge status: Home / Self Care | Payer: 59 | Attending: Surgery | Admitting: Surgery

## 2020-10-04 ENCOUNTER — Encounter (HOSPITAL_COMMUNITY): Payer: Self-pay | Admitting: Surgery

## 2020-10-04 ENCOUNTER — Inpatient Hospital Stay (HOSPITAL_COMMUNITY): Payer: 59 | Admitting: Certified Registered Nurse Anesthetist

## 2020-10-04 DIAGNOSIS — I4811 Longstanding persistent atrial fibrillation: Secondary | ICD-10-CM | POA: Diagnosis present

## 2020-10-04 DIAGNOSIS — I4891 Unspecified atrial fibrillation: Secondary | ICD-10-CM

## 2020-10-04 DIAGNOSIS — I471 Supraventricular tachycardia: Secondary | ICD-10-CM | POA: Diagnosis present

## 2020-10-04 DIAGNOSIS — E785 Hyperlipidemia, unspecified: Secondary | ICD-10-CM | POA: Diagnosis present

## 2020-10-04 DIAGNOSIS — Z952 Presence of prosthetic heart valve: Secondary | ICD-10-CM

## 2020-10-04 DIAGNOSIS — G4733 Obstructive sleep apnea (adult) (pediatric): Secondary | ICD-10-CM | POA: Diagnosis present

## 2020-10-04 DIAGNOSIS — R001 Bradycardia, unspecified: Secondary | ICD-10-CM | POA: Diagnosis not present

## 2020-10-04 DIAGNOSIS — J9811 Atelectasis: Secondary | ICD-10-CM | POA: Diagnosis not present

## 2020-10-04 DIAGNOSIS — I352 Nonrheumatic aortic (valve) stenosis with insufficiency: Secondary | ICD-10-CM | POA: Diagnosis present

## 2020-10-04 DIAGNOSIS — I4819 Other persistent atrial fibrillation: Secondary | ICD-10-CM | POA: Diagnosis not present

## 2020-10-04 DIAGNOSIS — E7800 Pure hypercholesterolemia, unspecified: Secondary | ICD-10-CM | POA: Diagnosis present

## 2020-10-04 DIAGNOSIS — I110 Hypertensive heart disease with heart failure: Secondary | ICD-10-CM | POA: Diagnosis present

## 2020-10-04 DIAGNOSIS — Z6836 Body mass index (BMI) 36.0-36.9, adult: Secondary | ICD-10-CM

## 2020-10-04 DIAGNOSIS — I272 Pulmonary hypertension, unspecified: Secondary | ICD-10-CM | POA: Diagnosis present

## 2020-10-04 DIAGNOSIS — I700 Atherosclerosis of aorta: Secondary | ICD-10-CM | POA: Diagnosis present

## 2020-10-04 DIAGNOSIS — I2510 Atherosclerotic heart disease of native coronary artery without angina pectoris: Secondary | ICD-10-CM | POA: Diagnosis present

## 2020-10-04 DIAGNOSIS — Q231 Congenital insufficiency of aortic valve: Secondary | ICD-10-CM

## 2020-10-04 DIAGNOSIS — I5042 Chronic combined systolic (congestive) and diastolic (congestive) heart failure: Secondary | ICD-10-CM | POA: Diagnosis present

## 2020-10-04 DIAGNOSIS — Z7984 Long term (current) use of oral hypoglycemic drugs: Secondary | ICD-10-CM

## 2020-10-04 DIAGNOSIS — Z87891 Personal history of nicotine dependence: Secondary | ICD-10-CM

## 2020-10-04 DIAGNOSIS — E119 Type 2 diabetes mellitus without complications: Secondary | ICD-10-CM | POA: Diagnosis present

## 2020-10-04 DIAGNOSIS — E669 Obesity, unspecified: Secondary | ICD-10-CM | POA: Diagnosis present

## 2020-10-04 DIAGNOSIS — Z833 Family history of diabetes mellitus: Secondary | ICD-10-CM

## 2020-10-04 DIAGNOSIS — I350 Nonrheumatic aortic (valve) stenosis: Secondary | ICD-10-CM

## 2020-10-04 DIAGNOSIS — D62 Acute posthemorrhagic anemia: Secondary | ICD-10-CM | POA: Diagnosis not present

## 2020-10-04 DIAGNOSIS — Z20822 Contact with and (suspected) exposure to covid-19: Secondary | ICD-10-CM | POA: Diagnosis present

## 2020-10-04 DIAGNOSIS — F4310 Post-traumatic stress disorder, unspecified: Secondary | ICD-10-CM | POA: Diagnosis present

## 2020-10-04 DIAGNOSIS — I351 Nonrheumatic aortic (valve) insufficiency: Secondary | ICD-10-CM | POA: Diagnosis not present

## 2020-10-04 DIAGNOSIS — R0603 Acute respiratory distress: Secondary | ICD-10-CM

## 2020-10-04 DIAGNOSIS — Z7901 Long term (current) use of anticoagulants: Secondary | ICD-10-CM | POA: Diagnosis not present

## 2020-10-04 DIAGNOSIS — D696 Thrombocytopenia, unspecified: Secondary | ICD-10-CM | POA: Diagnosis present

## 2020-10-04 DIAGNOSIS — Z79899 Other long term (current) drug therapy: Secondary | ICD-10-CM

## 2020-10-04 HISTORY — PX: AORTIC VALVE REPLACEMENT: SHX41

## 2020-10-04 HISTORY — PX: MAZE: SHX5063

## 2020-10-04 HISTORY — PX: TEE WITHOUT CARDIOVERSION: SHX5443

## 2020-10-04 LAB — POCT I-STAT 7, (LYTES, BLD GAS, ICA,H+H)
Acid-Base Excess: 3 mmol/L — ABNORMAL HIGH (ref 0.0–2.0)
Acid-Base Excess: 2 mmol/L (ref 0.0–2.0)
Acid-Base Excess: 1 mmol/L (ref 0.0–2.0)
Acid-base deficit: 5 mmol/L — ABNORMAL HIGH (ref 0.0–2.0)
Acid-base deficit: 5 mmol/L — ABNORMAL HIGH (ref 0.0–2.0)
Acid-base deficit: 5 mmol/L — ABNORMAL HIGH (ref 0.0–2.0)
Bicarbonate: 29.5 mmol/L — ABNORMAL HIGH (ref 20.0–28.0)
Bicarbonate: 26.7 mmol/L (ref 20.0–28.0)
Bicarbonate: 25.4 mmol/L (ref 20.0–28.0)
Bicarbonate: 22 mmol/L (ref 20.0–28.0)
Bicarbonate: 20.8 mmol/L (ref 20.0–28.0)
Bicarbonate: 21.7 mmol/L (ref 20.0–28.0)
Calcium, Ion: 1.05 mmol/L — ABNORMAL LOW (ref 1.15–1.40)
Calcium, Ion: 1.09 mmol/L — ABNORMAL LOW (ref 1.15–1.40)
Calcium, Ion: 1.08 mmol/L — ABNORMAL LOW (ref 1.15–1.40)
Calcium, Ion: 1.13 mmol/L — ABNORMAL LOW (ref 1.15–1.40)
Calcium, Ion: 1.19 mmol/L (ref 1.15–1.40)
Calcium, Ion: 1.18 mmol/L (ref 1.15–1.40)
HCT: 34 % — ABNORMAL LOW (ref 39.0–52.0)
HCT: 40 % (ref 39.0–52.0)
HCT: 39 % (ref 39.0–52.0)
HCT: 42 % (ref 39.0–52.0)
HCT: 42 % (ref 39.0–52.0)
HCT: 42 % (ref 39.0–52.0)
Hemoglobin: 11.6 g/dL — ABNORMAL LOW (ref 13.0–17.0)
Hemoglobin: 13.6 g/dL (ref 13.0–17.0)
Hemoglobin: 13.3 g/dL (ref 13.0–17.0)
Hemoglobin: 14.3 g/dL (ref 13.0–17.0)
Hemoglobin: 14.3 g/dL (ref 13.0–17.0)
Hemoglobin: 14.3 g/dL (ref 13.0–17.0)
O2 Saturation: 100 %
O2 Saturation: 100 %
O2 Saturation: 100 %
O2 Saturation: 99 %
O2 Saturation: 96 %
O2 Saturation: 95 %
Patient temperature: 37.2
Patient temperature: 38.5
Patient temperature: 38.2
Potassium: 4.3 mmol/L (ref 3.5–5.1)
Potassium: 5.6 mmol/L — ABNORMAL HIGH (ref 3.5–5.1)
Potassium: 4.9 mmol/L (ref 3.5–5.1)
Potassium: 4.9 mmol/L (ref 3.5–5.1)
Potassium: 4.6 mmol/L (ref 3.5–5.1)
Potassium: 4.7 mmol/L (ref 3.5–5.1)
Sodium: 140 mmol/L (ref 135–145)
Sodium: 135 mmol/L (ref 135–145)
Sodium: 137 mmol/L (ref 135–145)
Sodium: 141 mmol/L (ref 135–145)
Sodium: 141 mmol/L (ref 135–145)
Sodium: 143 mmol/L (ref 135–145)
TCO2: 31 mmol/L (ref 22–32)
TCO2: 28 mmol/L (ref 22–32)
TCO2: 26 mmol/L (ref 22–32)
TCO2: 23 mmol/L (ref 22–32)
TCO2: 22 mmol/L (ref 22–32)
TCO2: 23 mmol/L (ref 22–32)
pCO2 arterial: 56.2 mmHg — ABNORMAL HIGH (ref 32.0–48.0)
pCO2 arterial: 41.6 mmHg (ref 32.0–48.0)
pCO2 arterial: 37.2 mmHg (ref 32.0–48.0)
pCO2 arterial: 45.6 mmHg (ref 32.0–48.0)
pCO2 arterial: 42.9 mmHg (ref 32.0–48.0)
pCO2 arterial: 48.9 mmHg — ABNORMAL HIGH (ref 32.0–48.0)
pH, Arterial: 7.328 — ABNORMAL LOW (ref 7.350–7.450)
pH, Arterial: 7.416 (ref 7.350–7.450)
pH, Arterial: 7.441 (ref 7.350–7.450)
pH, Arterial: 7.292 — ABNORMAL LOW (ref 7.350–7.450)
pH, Arterial: 7.301 — ABNORMAL LOW (ref 7.350–7.450)
pH, Arterial: 7.261 — ABNORMAL LOW (ref 7.350–7.450)
pO2, Arterial: 392 mmHg — ABNORMAL HIGH (ref 83.0–108.0)
pO2, Arterial: 282 mmHg — ABNORMAL HIGH (ref 83.0–108.0)
pO2, Arterial: 317 mmHg — ABNORMAL HIGH (ref 83.0–108.0)
pO2, Arterial: 149 mmHg — ABNORMAL HIGH (ref 83.0–108.0)
pO2, Arterial: 99 mmHg (ref 83.0–108.0)
pO2, Arterial: 91 mmHg (ref 83.0–108.0)

## 2020-10-04 LAB — POCT I-STAT, CHEM 8
BUN: 12 mg/dL (ref 6–20)
BUN: 12 mg/dL (ref 6–20)
BUN: 13 mg/dL (ref 6–20)
BUN: 13 mg/dL (ref 6–20)
BUN: 13 mg/dL (ref 6–20)
Calcium, Ion: 1.27 mmol/L (ref 1.15–1.40)
Calcium, Ion: 1.25 mmol/L (ref 1.15–1.40)
Calcium, Ion: 1.1 mmol/L — ABNORMAL LOW (ref 1.15–1.40)
Calcium, Ion: 1.09 mmol/L — ABNORMAL LOW (ref 1.15–1.40)
Calcium, Ion: 1.06 mmol/L — ABNORMAL LOW (ref 1.15–1.40)
Chloride: 104 mmol/L (ref 98–111)
Chloride: 104 mmol/L (ref 98–111)
Chloride: 104 mmol/L (ref 98–111)
Chloride: 101 mmol/L (ref 98–111)
Chloride: 104 mmol/L (ref 98–111)
Creatinine, Ser: 0.9 mg/dL (ref 0.61–1.24)
Creatinine, Ser: 0.9 mg/dL (ref 0.61–1.24)
Creatinine, Ser: 0.9 mg/dL (ref 0.61–1.24)
Creatinine, Ser: 0.8 mg/dL (ref 0.61–1.24)
Creatinine, Ser: 0.9 mg/dL (ref 0.61–1.24)
Glucose, Bld: 116 mg/dL — ABNORMAL HIGH (ref 70–99)
Glucose, Bld: 123 mg/dL — ABNORMAL HIGH (ref 70–99)
Glucose, Bld: 142 mg/dL — ABNORMAL HIGH (ref 70–99)
Glucose, Bld: 144 mg/dL — ABNORMAL HIGH (ref 70–99)
Glucose, Bld: 130 mg/dL — ABNORMAL HIGH (ref 70–99)
HCT: 42 % (ref 39.0–52.0)
HCT: 41 % (ref 39.0–52.0)
HCT: 37 % — ABNORMAL LOW (ref 39.0–52.0)
HCT: 38 % — ABNORMAL LOW (ref 39.0–52.0)
HCT: 35 % — ABNORMAL LOW (ref 39.0–52.0)
Hemoglobin: 14.3 g/dL (ref 13.0–17.0)
Hemoglobin: 13.9 g/dL (ref 13.0–17.0)
Hemoglobin: 12.6 g/dL — ABNORMAL LOW (ref 13.0–17.0)
Hemoglobin: 12.9 g/dL — ABNORMAL LOW (ref 13.0–17.0)
Hemoglobin: 11.9 g/dL — ABNORMAL LOW (ref 13.0–17.0)
Potassium: 4 mmol/L (ref 3.5–5.1)
Potassium: 4.3 mmol/L (ref 3.5–5.1)
Potassium: 5.8 mmol/L — ABNORMAL HIGH (ref 3.5–5.1)
Potassium: 5.5 mmol/L — ABNORMAL HIGH (ref 3.5–5.1)
Potassium: 5.1 mmol/L (ref 3.5–5.1)
Sodium: 141 mmol/L (ref 135–145)
Sodium: 140 mmol/L (ref 135–145)
Sodium: 138 mmol/L (ref 135–145)
Sodium: 135 mmol/L (ref 135–145)
Sodium: 137 mmol/L (ref 135–145)
TCO2: 25 mmol/L (ref 22–32)
TCO2: 26 mmol/L (ref 22–32)
TCO2: 26 mmol/L (ref 22–32)
TCO2: 26 mmol/L (ref 22–32)
TCO2: 25 mmol/L (ref 22–32)

## 2020-10-04 LAB — BASIC METABOLIC PANEL
Anion gap: 5 (ref 5–15)
BUN: 11 mg/dL (ref 6–20)
CO2: 23 mmol/L (ref 22–32)
Calcium: 8 mg/dL — ABNORMAL LOW (ref 8.9–10.3)
Chloride: 111 mmol/L (ref 98–111)
Creatinine, Ser: 1.15 mg/dL (ref 0.61–1.24)
GFR, Estimated: >60 mL/min (ref >60)
Glucose, Bld: 143 mg/dL — ABNORMAL HIGH (ref 70–99)
Potassium: 4.5 mmol/L (ref 3.5–5.1)
Sodium: 139 mmol/L (ref 135–145)

## 2020-10-04 LAB — GLUCOSE, CAPILLARY
Glucose-Capillary: 117 mg/dL — ABNORMAL HIGH (ref 70–99)
Glucose-Capillary: 127 mg/dL — ABNORMAL HIGH (ref 70–99)
Glucose-Capillary: 151 mg/dL — ABNORMAL HIGH (ref 70–99)
Glucose-Capillary: 152 mg/dL — ABNORMAL HIGH (ref 70–99)
Glucose-Capillary: 148 mg/dL — ABNORMAL HIGH (ref 70–99)
Glucose-Capillary: 152 mg/dL — ABNORMAL HIGH (ref 70–99)
Glucose-Capillary: 140 mg/dL — ABNORMAL HIGH (ref 70–99)
Glucose-Capillary: 141 mg/dL — ABNORMAL HIGH (ref 70–99)
Glucose-Capillary: 170 mg/dL — ABNORMAL HIGH (ref 70–99)

## 2020-10-04 LAB — CBC
HCT: 43.8 % (ref 39.0–52.0)
HCT: 44 % (ref 39.0–52.0)
Hemoglobin: 14.8 g/dL (ref 13.0–17.0)
Hemoglobin: 14.5 g/dL (ref 13.0–17.0)
MCH: 32.1 pg (ref 26.0–34.0)
MCH: 31.7 pg (ref 26.0–34.0)
MCHC: 33.8 g/dL (ref 30.0–36.0)
MCHC: 33 g/dL (ref 30.0–36.0)
MCV: 95 fL (ref 80.0–100.0)
MCV: 96.3 fL (ref 80.0–100.0)
Platelets: 110 10*3/uL — ABNORMAL LOW (ref 150–400)
Platelets: 111 10*3/uL — ABNORMAL LOW (ref 150–400)
RBC: 4.61 MIL/uL (ref 4.22–5.81)
RBC: 4.57 MIL/uL (ref 4.22–5.81)
RDW: 13.9 % (ref 11.5–15.5)
RDW: 14.2 % (ref 11.5–15.5)
WBC: 15.9 10*3/uL — ABNORMAL HIGH (ref 4.0–10.5)
WBC: 16.2 10*3/uL — ABNORMAL HIGH (ref 4.0–10.5)
nRBC: 0 % (ref 0.0–0.2)
nRBC: 0 % (ref 0.0–0.2)

## 2020-10-04 LAB — POCT I-STAT EG7
Acid-Base Excess: 0 mmol/L (ref 0.0–2.0)
Bicarbonate: 27 mmol/L (ref 20.0–28.0)
Calcium, Ion: 1.08 mmol/L — ABNORMAL LOW (ref 1.15–1.40)
HCT: 35 % — ABNORMAL LOW (ref 39.0–52.0)
Hemoglobin: 11.9 g/dL — ABNORMAL LOW (ref 13.0–17.0)
O2 Saturation: 85 %
Potassium: 5.3 mmol/L — ABNORMAL HIGH (ref 3.5–5.1)
Sodium: 138 mmol/L (ref 135–145)
TCO2: 29 mmol/L (ref 22–32)
pCO2, Ven: 51.7 mmHg (ref 44.0–60.0)
pH, Ven: 7.325 (ref 7.250–7.430)
pO2, Ven: 55 mmHg — ABNORMAL HIGH (ref 32.0–45.0)

## 2020-10-04 LAB — ECHO INTRAOPERATIVE TEE
AR max vel: 2.38 cm2
AV Area VTI: 2.57 cm2
AV Area mean vel: 2.41 cm2
AV Mean grad: 8.6 mmHg
AV Peak grad: 13.5 mmHg
AV Vena cont: 0.39 cm
Ao pk vel: 1.84 m/s
Area-P 1/2: 3.85 cm2
Height: 73 in
MV VTI: 7.81 cm2
P 1/2 time: 568 msec
S' Lateral: 3.1 cm
Weight: 4128 oz

## 2020-10-04 LAB — PLATELET COUNT: Platelets: 107 10*3/uL — ABNORMAL LOW (ref 150–400)

## 2020-10-04 LAB — HEMOGLOBIN AND HEMATOCRIT, BLOOD
HCT: 40.1 % (ref 39.0–52.0)
Hemoglobin: 13.8 g/dL (ref 13.0–17.0)

## 2020-10-04 LAB — APTT: aPTT: 32 seconds (ref 24–36)

## 2020-10-04 LAB — PROTIME-INR
INR: 1.4 — ABNORMAL HIGH (ref 0.8–1.2)
Prothrombin Time: 16.4 seconds — ABNORMAL HIGH (ref 11.4–15.2)

## 2020-10-04 LAB — MAGNESIUM: Magnesium: 3.6 mg/dL — ABNORMAL HIGH (ref 1.7–2.4)

## 2020-10-04 SURGERY — REPLACEMENT, AORTIC VALVE, OPEN
Anesthesia: General | Site: Chest

## 2020-10-04 MED ORDER — ACETAMINOPHEN 650 MG RE SUPP
650.0000 mg | Freq: Once | RECTAL | Status: AC
  Administered 2020-10-04: 650 mg via RECTAL

## 2020-10-04 MED ORDER — PHENYLEPHRINE HCL-NACL 20-0.9 MG/250ML-% IV SOLN
0.0000 ug/min | INTRAVENOUS | Status: DC
  Administered 2020-10-05: 10 ug/min via INTRAVENOUS
  Filled 2020-10-04: qty 250

## 2020-10-04 MED ORDER — LORATADINE 10 MG PO TABS
10.0000 mg | ORAL_TABLET | Freq: Every day | ORAL | Status: DC
  Administered 2020-10-05 – 2020-10-10 (×6): 10 mg via ORAL
  Filled 2020-10-04 (×6): qty 1

## 2020-10-04 MED ORDER — FENTANYL CITRATE (PF) 250 MCG/5ML IJ SOLN
INTRAMUSCULAR | Status: AC
  Filled 2020-10-04: qty 5

## 2020-10-04 MED ORDER — ONDANSETRON HCL 4 MG/2ML IJ SOLN: 4.0000 mg | Freq: Four times a day (QID) | INTRAMUSCULAR | Status: DC | PRN

## 2020-10-04 MED ORDER — HEPARIN SODIUM (PORCINE) 1000 UNIT/ML IJ SOLN
INTRAMUSCULAR | Status: AC
  Filled 2020-10-04: qty 1

## 2020-10-04 MED ORDER — DOCUSATE SODIUM 100 MG PO CAPS
200.0000 mg | ORAL_CAPSULE | Freq: Every day | ORAL | Status: DC
  Administered 2020-10-05 – 2020-10-09 (×5): 200 mg via ORAL
  Filled 2020-10-04 (×5): qty 2

## 2020-10-04 MED ORDER — ACETAMINOPHEN 160 MG/5ML PO SOLN: 650.0000 mg | Freq: Once | ORAL | Status: AC

## 2020-10-04 MED ORDER — PROTAMINE SULFATE 10 MG/ML IV SOLN
INTRAVENOUS | Status: AC
  Filled 2020-10-04: qty 15

## 2020-10-04 MED ORDER — NITROGLYCERIN 0.2 MG/ML ON CALL CATH LAB
INTRAVENOUS | Status: DC | PRN
  Administered 2020-10-04: 20 ug via INTRAVENOUS

## 2020-10-04 MED ORDER — SODIUM CHLORIDE 0.9 % IV SOLN: INTRAVENOUS | Status: DC

## 2020-10-04 MED ORDER — DOPAMINE-DEXTROSE 3.2-5 MG/ML-% IV SOLN
3.0000 ug/kg/min | INTRAVENOUS | Status: DC
  Administered 2020-10-04: 3 ug/kg/min via INTRAVENOUS
  Filled 2020-10-04: qty 250

## 2020-10-04 MED ORDER — PROPOFOL 10 MG/ML IV BOLUS
INTRAVENOUS | Status: AC
  Filled 2020-10-04: qty 20

## 2020-10-04 MED ORDER — SODIUM CHLORIDE 0.9 % IV SOLN: 250.0000 mL | INTRAVENOUS | Status: DC

## 2020-10-04 MED ORDER — CHLORHEXIDINE GLUCONATE 0.12 % MT SOLN
15.0000 mL | Freq: Once | OROMUCOSAL | Status: AC
  Administered 2020-10-04: 15 mL via OROMUCOSAL
  Filled 2020-10-04: qty 15

## 2020-10-04 MED ORDER — LAMOTRIGINE 25 MG PO TABS
150.0000 mg | ORAL_TABLET | Freq: Every day | ORAL | Status: DC
  Administered 2020-10-05 – 2020-10-10 (×6): 150 mg via ORAL
  Filled 2020-10-04 (×2): qty 6
  Filled 2020-10-04: qty 1
  Filled 2020-10-04 (×2): qty 6
  Filled 2020-10-04: qty 1

## 2020-10-04 MED ORDER — ATORVASTATIN CALCIUM 40 MG PO TABS
40.0000 mg | ORAL_TABLET | Freq: Every day | ORAL | Status: DC
  Administered 2020-10-05 – 2020-10-10 (×6): 40 mg via ORAL
  Filled 2020-10-04 (×6): qty 1

## 2020-10-04 MED ORDER — MORPHINE SULFATE (PF) 2 MG/ML IV SOLN
1.0000 mg | INTRAVENOUS | Status: DC | PRN
  Administered 2020-10-04 – 2020-10-05 (×3): 2 mg via INTRAVENOUS
  Filled 2020-10-04 (×4): qty 1

## 2020-10-04 MED ORDER — PROTAMINE SULFATE 10 MG/ML IV SOLN
INTRAVENOUS | Status: AC
  Filled 2020-10-04: qty 25

## 2020-10-04 MED ORDER — OXYCODONE HCL 5 MG PO TABS
5.0000 mg | ORAL_TABLET | ORAL | Status: DC | PRN
  Administered 2020-10-05: 5 mg via ORAL
  Administered 2020-10-05: 10 mg via ORAL
  Administered 2020-10-05: 5 mg via ORAL
  Administered 2020-10-05 – 2020-10-10 (×8): 10 mg via ORAL
  Filled 2020-10-04: qty 2
  Filled 2020-10-04: qty 1
  Filled 2020-10-04 (×8): qty 2
  Filled 2020-10-04: qty 1

## 2020-10-04 MED ORDER — BUPROPION HCL ER (XL) 150 MG PO TB24
150.0000 mg | ORAL_TABLET | Freq: Every day | ORAL | Status: DC
  Administered 2020-10-05 – 2020-10-10 (×6): 150 mg via ORAL
  Filled 2020-10-04 (×6): qty 1

## 2020-10-04 MED ORDER — INSULIN REGULAR(HUMAN) IN NACL 100-0.9 UT/100ML-% IV SOLN
INTRAVENOUS | Status: DC
  Administered 2020-10-04: 5 [IU]/h via INTRAVENOUS
  Filled 2020-10-04: qty 100

## 2020-10-04 MED ORDER — CITALOPRAM HYDROBROMIDE 20 MG PO TABS
40.0000 mg | ORAL_TABLET | Freq: Every morning | ORAL | Status: DC
  Administered 2020-10-05 – 2020-10-09 (×5): 40 mg via ORAL
  Filled 2020-10-04: qty 2
  Filled 2020-10-04: qty 1
  Filled 2020-10-04: qty 2
  Filled 2020-10-04: qty 1
  Filled 2020-10-04 (×2): qty 2

## 2020-10-04 MED ORDER — 0.9 % SODIUM CHLORIDE (POUR BTL) OPTIME
TOPICAL | Status: DC | PRN
  Administered 2020-10-04: 5000 mL

## 2020-10-04 MED ORDER — AMIODARONE HCL IN DEXTROSE 360-4.14 MG/200ML-% IV SOLN
30.0000 mg/h | INTRAVENOUS | Status: DC
  Administered 2020-10-04 – 2020-10-05 (×2): 30 mg/h via INTRAVENOUS
  Filled 2020-10-04 (×2): qty 200

## 2020-10-04 MED ORDER — VANCOMYCIN HCL IN DEXTROSE 1-5 GM/200ML-% IV SOLN
1000.0000 mg | Freq: Once | INTRAVENOUS | Status: AC
  Administered 2020-10-04: 1000 mg via INTRAVENOUS
  Filled 2020-10-04: qty 200

## 2020-10-04 MED ORDER — FENTANYL CITRATE (PF) 250 MCG/5ML IJ SOLN
INTRAMUSCULAR | Status: DC | PRN
  Administered 2020-10-04: 100 ug via INTRAVENOUS
  Administered 2020-10-04: 25 ug via INTRAVENOUS
  Administered 2020-10-04: 100 ug via INTRAVENOUS
  Administered 2020-10-04: 50 ug via INTRAVENOUS
  Administered 2020-10-04 (×2): 100 ug via INTRAVENOUS
  Administered 2020-10-04: 150 ug via INTRAVENOUS
  Administered 2020-10-04: 25 ug via INTRAVENOUS
  Administered 2020-10-04: 150 ug via INTRAVENOUS
  Administered 2020-10-04 (×2): 100 ug via INTRAVENOUS
  Administered 2020-10-04: 50 ug via INTRAVENOUS
  Administered 2020-10-04 (×2): 150 ug via INTRAVENOUS

## 2020-10-04 MED ORDER — CHLORHEXIDINE GLUCONATE 0.12 % MT SOLN
15.0000 mL | OROMUCOSAL | Status: AC
  Administered 2020-10-04: 15 mL via OROMUCOSAL

## 2020-10-04 MED ORDER — LACTATED RINGERS IV SOLN: INTRAVENOUS | Status: DC | PRN

## 2020-10-04 MED ORDER — CHLORHEXIDINE GLUCONATE 4 % EX LIQD: 30.0000 mL | CUTANEOUS | Status: DC

## 2020-10-04 MED ORDER — TRANEXAMIC ACID 1000 MG/10ML IV SOLN
1.5000 mg/kg/h | INTRAVENOUS | Status: DC
  Filled 2020-10-04: qty 25

## 2020-10-04 MED ORDER — LACTATED RINGERS IV SOLN: INTRAVENOUS | Status: DC

## 2020-10-04 MED ORDER — DEXMEDETOMIDINE HCL IN NACL 400 MCG/100ML IV SOLN: 0.0000 ug/kg/h | INTRAVENOUS | Status: DC

## 2020-10-04 MED ORDER — PANTOPRAZOLE SODIUM 40 MG PO TBEC
40.0000 mg | DELAYED_RELEASE_TABLET | Freq: Every day | ORAL | Status: DC
  Administered 2020-10-06 – 2020-10-10 (×5): 40 mg via ORAL
  Filled 2020-10-04 (×5): qty 1

## 2020-10-04 MED ORDER — BISACODYL 5 MG PO TBEC
10.0000 mg | DELAYED_RELEASE_TABLET | Freq: Every day | ORAL | Status: DC
  Administered 2020-10-05 – 2020-10-09 (×3): 10 mg via ORAL
  Filled 2020-10-04 (×4): qty 2

## 2020-10-04 MED ORDER — HEPARIN SODIUM (PORCINE) 1000 UNIT/ML IJ SOLN
INTRAMUSCULAR | Status: DC | PRN
  Administered 2020-10-04: 37000 [IU] via INTRAVENOUS

## 2020-10-04 MED ORDER — DEXTROSE 50 % IV SOLN: 0.0000 mL | INTRAVENOUS | Status: DC | PRN

## 2020-10-04 MED ORDER — TRAMADOL HCL 50 MG PO TABS
50.0000 mg | ORAL_TABLET | ORAL | Status: DC | PRN
  Administered 2020-10-05: 50 mg via ORAL
  Administered 2020-10-05: 100 mg via ORAL
  Filled 2020-10-04: qty 2
  Filled 2020-10-04: qty 1

## 2020-10-04 MED ORDER — ARTIFICIAL TEARS OPHTHALMIC OINT
TOPICAL_OINTMENT | OPHTHALMIC | Status: DC | PRN
  Administered 2020-10-04: 1 via OPHTHALMIC

## 2020-10-04 MED ORDER — ACETAMINOPHEN 160 MG/5ML PO SOLN: 1000.0000 mg | Freq: Four times a day (QID) | ORAL | Status: AC

## 2020-10-04 MED ORDER — PHENYLEPHRINE 40 MCG/ML (10ML) SYRINGE FOR IV PUSH (FOR BLOOD PRESSURE SUPPORT)
PREFILLED_SYRINGE | INTRAVENOUS | Status: DC | PRN
  Administered 2020-10-04: 40 ug via INTRAVENOUS

## 2020-10-04 MED ORDER — PROTAMINE SULFATE 10 MG/ML IV SOLN
INTRAVENOUS | Status: DC | PRN
  Administered 2020-10-04: 330 mg via INTRAVENOUS

## 2020-10-04 MED ORDER — DEXMEDETOMIDINE HCL IN NACL 400 MCG/100ML IV SOLN
0.1000 ug/kg/h | INTRAVENOUS | Status: DC
  Filled 2020-10-04: qty 100

## 2020-10-04 MED ORDER — CHLORHEXIDINE GLUCONATE CLOTH 2 % EX PADS
6.0000 | MEDICATED_PAD | Freq: Every day | CUTANEOUS | Status: DC
  Administered 2020-10-04 – 2020-10-06 (×3): 6 via TOPICAL

## 2020-10-04 MED ORDER — ASPIRIN 81 MG PO CHEW
324.0000 mg | CHEWABLE_TABLET | Freq: Every day | ORAL | Status: DC
  Filled 2020-10-04: qty 4

## 2020-10-04 MED ORDER — ACETAMINOPHEN 500 MG PO TABS
1000.0000 mg | ORAL_TABLET | Freq: Four times a day (QID) | ORAL | Status: AC
  Administered 2020-10-05 – 2020-10-09 (×17): 1000 mg via ORAL
  Filled 2020-10-04 (×18): qty 2

## 2020-10-04 MED ORDER — SODIUM BICARBONATE 8.4 % IV SOLN
50.0000 meq | Freq: Once | INTRAVENOUS | Status: AC
  Administered 2020-10-04: 50 meq via INTRAVENOUS

## 2020-10-04 MED ORDER — SODIUM CHLORIDE 0.9 % IV SOLN
1.5000 g | Freq: Two times a day (BID) | INTRAVENOUS | Status: AC
  Administered 2020-10-04 – 2020-10-06 (×4): 1.5 g via INTRAVENOUS
  Filled 2020-10-04 (×4): qty 1.5

## 2020-10-04 MED ORDER — ROCURONIUM BROMIDE 10 MG/ML (PF) SYRINGE
PREFILLED_SYRINGE | INTRAVENOUS | Status: AC
  Filled 2020-10-04: qty 20

## 2020-10-04 MED ORDER — METOPROLOL TARTRATE 12.5 MG HALF TABLET: 12.5000 mg | ORAL_TABLET | Freq: Two times a day (BID) | ORAL | Status: DC

## 2020-10-04 MED ORDER — PLASMA-LYTE 148 IV SOLN
INTRAVENOUS | Status: DC | PRN
  Administered 2020-10-04: 500 mL via INTRAVASCULAR

## 2020-10-04 MED ORDER — ROCURONIUM BROMIDE 10 MG/ML (PF) SYRINGE
PREFILLED_SYRINGE | INTRAVENOUS | Status: DC | PRN
  Administered 2020-10-04: 30 mg via INTRAVENOUS
  Administered 2020-10-04: 50 mg via INTRAVENOUS
  Administered 2020-10-04: 20 mg via INTRAVENOUS
  Administered 2020-10-04: 70 mg via INTRAVENOUS
  Administered 2020-10-04: 30 mg via INTRAVENOUS

## 2020-10-04 MED ORDER — PROPOFOL 10 MG/ML IV BOLUS
INTRAVENOUS | Status: DC | PRN
  Administered 2020-10-04 (×2): 20 mg via INTRAVENOUS
  Administered 2020-10-04: 50 mg via INTRAVENOUS
  Administered 2020-10-04: 20 mg via INTRAVENOUS
  Administered 2020-10-04 (×2): 30 mg via INTRAVENOUS
  Administered 2020-10-04: 150 mg via INTRAVENOUS
  Administered 2020-10-04: 20 mg via INTRAVENOUS

## 2020-10-04 MED ORDER — LACTATED RINGERS IV SOLN
500.0000 mL | Freq: Once | INTRAVENOUS | Status: AC | PRN
  Administered 2020-10-05: 500 mL via INTRAVENOUS

## 2020-10-04 MED ORDER — SODIUM CHLORIDE 0.9% FLUSH
3.0000 mL | Freq: Two times a day (BID) | INTRAVENOUS | Status: DC
  Administered 2020-10-05 – 2020-10-06 (×3): 3 mL via INTRAVENOUS

## 2020-10-04 MED ORDER — MIDAZOLAM HCL 5 MG/5ML IJ SOLN
INTRAMUSCULAR | Status: DC | PRN
  Administered 2020-10-04: 1 mg via INTRAVENOUS
  Administered 2020-10-04: 2 mg via INTRAVENOUS
  Administered 2020-10-04 (×4): 1 mg via INTRAVENOUS

## 2020-10-04 MED ORDER — NITROGLYCERIN IN D5W 200-5 MCG/ML-% IV SOLN: 0.0000 ug/min | INTRAVENOUS | Status: DC

## 2020-10-04 MED ORDER — ASPIRIN EC 325 MG PO TBEC
325.0000 mg | DELAYED_RELEASE_TABLET | Freq: Every day | ORAL | Status: DC
  Administered 2020-10-05: 325 mg via ORAL
  Filled 2020-10-04: qty 1

## 2020-10-04 MED ORDER — ALBUMIN HUMAN 5 % IV SOLN: INTRAVENOUS | Status: DC | PRN

## 2020-10-04 MED ORDER — FENTANYL CITRATE (PF) 250 MCG/5ML IJ SOLN
INTRAMUSCULAR | Status: AC
  Filled 2020-10-04: qty 25

## 2020-10-04 MED ORDER — MAGNESIUM SULFATE 4 GM/100ML IV SOLN
4.0000 g | Freq: Once | INTRAVENOUS | Status: AC
  Administered 2020-10-04: 4 g via INTRAVENOUS
  Filled 2020-10-04: qty 100

## 2020-10-04 MED ORDER — PHENYLEPHRINE 40 MCG/ML (10ML) SYRINGE FOR IV PUSH (FOR BLOOD PRESSURE SUPPORT)
PREFILLED_SYRINGE | INTRAVENOUS | Status: AC
  Filled 2020-10-04: qty 10

## 2020-10-04 MED ORDER — SODIUM CHLORIDE 0.9% FLUSH: 3.0000 mL | INTRAVENOUS | Status: DC | PRN

## 2020-10-04 MED ORDER — METOPROLOL TARTRATE 12.5 MG HALF TABLET
12.5000 mg | ORAL_TABLET | Freq: Once | ORAL | Status: DC
  Filled 2020-10-04: qty 1

## 2020-10-04 MED ORDER — METOCLOPRAMIDE HCL 5 MG/ML IJ SOLN
10.0000 mg | Freq: Four times a day (QID) | INTRAMUSCULAR | Status: AC
  Administered 2020-10-04 – 2020-10-05 (×3): 10 mg via INTRAVENOUS
  Filled 2020-10-04 (×3): qty 2

## 2020-10-04 MED ORDER — METOPROLOL TARTRATE 5 MG/5ML IV SOLN
2.5000 mg | INTRAVENOUS | Status: DC | PRN
Start: 2020-10-04 — End: 2020-10-05

## 2020-10-04 MED ORDER — POTASSIUM CHLORIDE 10 MEQ/50ML IV SOLN: 10.0000 meq | INTRAVENOUS | Status: AC

## 2020-10-04 MED ORDER — THROMBIN 20000 UNITS EX SOLR
OROMUCOSAL | Status: DC | PRN
  Administered 2020-10-04 (×3): 4 mL via TOPICAL

## 2020-10-04 MED ORDER — HEMOSTATIC AGENTS (NO CHARGE) OPTIME
TOPICAL | Status: DC | PRN
  Administered 2020-10-04: 1 via TOPICAL

## 2020-10-04 MED ORDER — FAMOTIDINE IN NACL 20-0.9 MG/50ML-% IV SOLN
20.0000 mg | Freq: Two times a day (BID) | INTRAVENOUS | Status: DC
  Administered 2020-10-04: 20 mg via INTRAVENOUS
  Filled 2020-10-04: qty 50

## 2020-10-04 MED ORDER — STERILE WATER FOR IRRIGATION IR SOLN
Status: DC | PRN
  Administered 2020-10-04: 2000 mL

## 2020-10-04 MED ORDER — METOPROLOL TARTRATE 25 MG/10 ML ORAL SUSPENSION: 12.5000 mg | Freq: Two times a day (BID) | ORAL | Status: DC

## 2020-10-04 MED ORDER — QUETIAPINE FUMARATE 25 MG PO TABS
25.0000 mg | ORAL_TABLET | Freq: Every day | ORAL | Status: DC
  Administered 2020-10-05 – 2020-10-08 (×4): 25 mg via ORAL
  Filled 2020-10-04 (×4): qty 1

## 2020-10-04 MED ORDER — THROMBIN (RECOMBINANT) 20000 UNITS EX SOLR
CUTANEOUS | Status: AC
  Filled 2020-10-04: qty 20000

## 2020-10-04 MED ORDER — BISACODYL 10 MG RE SUPP: 10.0000 mg | Freq: Every day | RECTAL | Status: DC

## 2020-10-04 MED ORDER — ALBUMIN HUMAN 5 % IV SOLN
250.0000 mL | INTRAVENOUS | Status: DC | PRN
Start: 2020-10-04 — End: 2020-10-05
  Administered 2020-10-04 – 2020-10-05 (×3): 12.5 g via INTRAVENOUS
  Filled 2020-10-04 (×3): qty 250

## 2020-10-04 MED ORDER — SODIUM CHLORIDE 0.45 % IV SOLN: INTRAVENOUS | Status: DC | PRN

## 2020-10-04 MED ORDER — MIDAZOLAM HCL 2 MG/2ML IJ SOLN
2.0000 mg | INTRAMUSCULAR | Status: DC | PRN
Start: 2020-10-04 — End: 2020-10-04

## 2020-10-04 MED ORDER — MIDAZOLAM HCL (PF) 10 MG/2ML IJ SOLN
INTRAMUSCULAR | Status: AC
  Filled 2020-10-04: qty 2

## 2020-10-04 SURGICAL SUPPLY — 89 items
ADAPTER CARDIO PERF ANTE/RETRO (ADAPTER) ×3 IMPLANT
ADPR PRFSN 84XANTGRD RTRGD (ADAPTER) ×2
ARTICLIP LAA PROCLIP II 45 (Clip) ×3 IMPLANT
BAG DECANTER FOR FLEXI CONT (MISCELLANEOUS) ×3 IMPLANT
BLADE CLIPPER SURG (BLADE) ×3 IMPLANT
BLADE STERNUM SYSTEM 6 (BLADE) ×3 IMPLANT
BLADE SURG 15 STRL LF DISP TIS (BLADE) ×2 IMPLANT
BLADE SURG 15 STRL SS (BLADE) ×3
CANISTER SUCT 3000ML PPV (MISCELLANEOUS) ×3 IMPLANT
CANN PRFSN 3/8XCNCT ST RT ANG (MISCELLANEOUS) ×2
CANNULA ARTERIAL NVNT 3/8 22FR (MISCELLANEOUS) ×1 IMPLANT
CANNULA GUNDRY RCSP 15FR (MISCELLANEOUS) ×3 IMPLANT
CANNULA PRFSN 3/8XCNCT RT ANG (MISCELLANEOUS) IMPLANT
CANNULA SUMP PERICARDIAL (CANNULA) ×1 IMPLANT
CANNULA VEN MTL TIP RT (MISCELLANEOUS) ×3
CANNULA VRC MALB SNGL STG 36FR (MISCELLANEOUS) IMPLANT
CARDIOBLATE CARDIAC ABLATION (MISCELLANEOUS)
CATH HEART VENT LEFT (CATHETERS) ×2 IMPLANT
CATH ROBINSON RED A/P 18FR (CATHETERS) ×9 IMPLANT
CATH THORACIC 36FR (CATHETERS) ×3 IMPLANT
CATH THORACIC 36FR RT ANG (CATHETERS) ×3 IMPLANT
CLAMP ISOLATOR SYNERGY LG (MISCELLANEOUS) ×1 IMPLANT
CNTNR URN SCR LID CUP LEK RST (MISCELLANEOUS) ×2 IMPLANT
CONN ST 1/2X1/2  BEN (MISCELLANEOUS) ×3
CONN ST 1/2X1/2 BEN (MISCELLANEOUS) IMPLANT
CONT SPEC 4OZ STRL OR WHT (MISCELLANEOUS) ×3
COVER SURGICAL LIGHT HANDLE (MISCELLANEOUS) ×3 IMPLANT
DEVICE ATRICLIP LAA PRCLPII 45 (Clip) IMPLANT
DEVICE CARDIOBLATE CARDIAC ABL (MISCELLANEOUS) IMPLANT
DEVICE SUT CK QUICK LOAD MINI (Prosthesis & Implant Heart) ×1 IMPLANT
DRAPE CARDIOVASCULAR INCISE (DRAPES) ×3
DRAPE SLUSH/WARMER DISC (DRAPES) ×3 IMPLANT
DRAPE SRG 135X102X78XABS (DRAPES) ×2 IMPLANT
DRSG COVADERM 4X14 (GAUZE/BANDAGES/DRESSINGS) ×3 IMPLANT
ELECT CAUTERY BLADE 6.4 (BLADE) ×3 IMPLANT
ELECT REM PT RETURN 9FT ADLT (ELECTROSURGICAL) ×6
ELECTRODE REM PT RTRN 9FT ADLT (ELECTROSURGICAL) ×4 IMPLANT
FELT TEFLON 1X6 (MISCELLANEOUS) ×8 IMPLANT
GAUZE SPONGE 4X4 12PLY STRL (GAUZE/BANDAGES/DRESSINGS) ×3 IMPLANT
GAUZE SPONGE 4X4 12PLY STRL LF (GAUZE/BANDAGES/DRESSINGS) ×1 IMPLANT
GLOVE BIO SURGEON STRL SZ 6 (GLOVE) IMPLANT
GLOVE BIO SURGEON STRL SZ 6.5 (GLOVE) IMPLANT
GLOVE BIO SURGEON STRL SZ7 (GLOVE) IMPLANT
GLOVE BIO SURGEON STRL SZ7.5 (GLOVE) IMPLANT
GLOVE TRIUMPH SURG SIZE 7.0 (KITS) ×6 IMPLANT
GOWN STRL REUS W/ TWL LRG LVL3 (GOWN DISPOSABLE) ×8 IMPLANT
GOWN STRL REUS W/ TWL XL LVL3 (GOWN DISPOSABLE) ×2 IMPLANT
GOWN STRL REUS W/TWL LRG LVL3 (GOWN DISPOSABLE) ×24
GOWN STRL REUS W/TWL XL LVL3 (GOWN DISPOSABLE) ×3
HEMOSTAT POWDER SURGIFOAM 1G (HEMOSTASIS) ×9 IMPLANT
HEMOSTAT SURGICEL 2X14 (HEMOSTASIS) ×3 IMPLANT
INSERT FOGARTY XLG (MISCELLANEOUS) ×1 IMPLANT
KIT BASIN OR (CUSTOM PROCEDURE TRAY) ×3 IMPLANT
KIT CATH CPB BARTLE (MISCELLANEOUS) ×3 IMPLANT
KIT SUCTION CATH 14FR (SUCTIONS) ×3 IMPLANT
KIT SUT CK MINI COMBO 4X17 (Prosthesis & Implant Heart) ×1 IMPLANT
KIT TURNOVER KIT B (KITS) ×3 IMPLANT
LINE VENT (MISCELLANEOUS) ×1 IMPLANT
LOOP VESSEL SUPERMAXI WHITE (MISCELLANEOUS) ×2 IMPLANT
NS IRRIG 1000ML POUR BTL (IV SOLUTION) ×18 IMPLANT
PACK E OPEN HEART (SUTURE) ×3 IMPLANT
PACK OPEN HEART (CUSTOM PROCEDURE TRAY) ×3 IMPLANT
PAD ARMBOARD 7.5X6 YLW CONV (MISCELLANEOUS) ×6 IMPLANT
POSITIONER HEAD DONUT 9IN (MISCELLANEOUS) ×3 IMPLANT
PROBE CRYO2-ABLATION MALLABLE (MISCELLANEOUS) ×1 IMPLANT
SET CARDIOPLEGIA MPS 5001102 (MISCELLANEOUS) ×1 IMPLANT
SUT BONE WAX W31G (SUTURE) ×3 IMPLANT
SUT EB EXC GRN/WHT 2-0 V-5 (SUTURE) ×6 IMPLANT
SUT ETHIBON EXCEL 2-0 V-5 (SUTURE) ×3 IMPLANT
SUT ETHIBOND V-5 VALVE (SUTURE) ×2 IMPLANT
SUT PROLENE 3 0 SH DA (SUTURE) IMPLANT
SUT PROLENE 3 0 SH1 36 (SUTURE) ×5 IMPLANT
SUT PROLENE 4 0 RB 1 (SUTURE) ×21
SUT PROLENE 4-0 RB1 .5 CRCL 36 (SUTURE) ×6 IMPLANT
SUT PROLENE 5 0 C 1 36 (SUTURE) ×1 IMPLANT
SUT SILK 2 0 SH CR/8 (SUTURE) ×1 IMPLANT
SUT STEEL 6MS V (SUTURE) ×3 IMPLANT
SUT VIC AB 1 CTX 36 (SUTURE) ×9
SUT VIC AB 1 CTX36XBRD ANBCTR (SUTURE) ×4 IMPLANT
SYSTEM SAHARA CHEST DRAIN ATS (WOUND CARE) ×3 IMPLANT
TAPE CLOTH SURG 4X10 WHT LF (GAUZE/BANDAGES/DRESSINGS) ×1 IMPLANT
TOWEL GREEN STERILE (TOWEL DISPOSABLE) ×3 IMPLANT
TOWEL GREEN STERILE FF (TOWEL DISPOSABLE) ×3 IMPLANT
TRAY FOLEY SLVR 16FR TEMP STAT (SET/KITS/TRAYS/PACK) ×3 IMPLANT
UNDERPAD 30X36 HEAVY ABSORB (UNDERPADS AND DIAPERS) ×3 IMPLANT
VALVE AORTIC SZ25 INSP/RESIL (Prosthesis & Implant Heart) ×1 IMPLANT
VENT LEFT HEART 12002 (CATHETERS) ×3
VRC MALLEABLE SINGLE STG 36FR (MISCELLANEOUS) ×3
WATER STERILE IRR 1000ML POUR (IV SOLUTION) ×6 IMPLANT

## 2020-10-04 NOTE — Progress Notes (Signed)
Patient ID: Dalton [name], male   DOB: 1965/10/26, 55 y.o.   MRN: [id number]  TCTS Evening Rounds:   Hemodynamically labile BP on neo.  CI has been reading 1-1.3 postop and we never good reliable numbers in the OR despite good LV function. I don't think the swan is accurate. Will start on dop 3 to support BP.  Has started to wake up on vent. Weaning.  Urine output good  CT output low  CBC    Component Value Date/Time   WBC 15.9 (H) 10/04/2020 1441   RBC 4.61 10/04/2020 1441   HGB 14.8 10/04/2020 1441   HGB 14.8 11/11/2019 0915   HCT 43.8 10/04/2020 1441   HCT 43.8 11/11/2019 0915   PLT 110 (L) 10/04/2020 1441   PLT 197 11/11/2019 0915   MCV 95.0 10/04/2020 1441   MCV 94 11/11/2019 0915   MCH 32.1 10/04/2020 1441   MCHC 33.8 10/04/2020 1441   RDW 13.9 10/04/2020 1441   RDW 14.0 11/11/2019 0915   LYMPHSABS 2.3 05/14/2018 0951   MONOABS 0.5 05/14/2018 0951   EOSABS 0.1 05/14/2018 0951   BASOSABS 0.1 05/14/2018 0951     BMET    Component Value Date/Time   NA 137 10/04/2020 1224   NA 138 01/17/2020 0802   K 5.1 10/04/2020 1224   CL 104 10/04/2020 1224   CO2 21 (L) 10/02/2020 1431   GLUCOSE 130 (H) 10/04/2020 1224   BUN 13 10/04/2020 1224   BUN 13 01/17/2020 0802   CREATININE 0.90 10/04/2020 1224   CREATININE 1.09 04/26/2015 0854   CALCIUM 9.0 10/02/2020 1431   GFRNONAA >60 10/02/2020 1431   GFRAA 92 01/17/2020 0802     A/P:  Stable postop course. Continue current plans

## 2020-10-04 NOTE — Progress Notes (Signed)
  Echocardiogram Echocardiogram Transesophageal has been performed.  Fidel Levy 10/04/2020, 9:34 AM

## 2020-10-04 NOTE — Transfer of Care (Signed)
Immediate Anesthesia Transfer of Care Note  Patient: Dalton [name]  Procedure(s) Performed: AORTIC VALVE REPLACEMENT (AVR) (N/A Chest) MAZE (N/A Chest) TRANSESOPHAGEAL ECHOCARDIOGRAM (TEE) (N/A )  Patient Location: ICU  Anesthesia Type:General  Level of Consciousness: sedated and Patient remains intubated per anesthesia plan  Airway & Oxygen Therapy: Patient remains intubated per anesthesia plan and Patient placed on Ventilator (see vital sign flow sheet for setting)  Post-op Assessment: Report given to RN and Post -op Vital signs reviewed and stable  Post vital signs: Reviewed and stable  Last Vitals:  Vitals Value Taken Time  BP 115/82 10/04/20 1430  Temp    Pulse 89 10/04/20 1433  Resp 14 10/04/20 1433  SpO2 100 % 10/04/20 1433  Vitals shown include unvalidated device data.  Last Pain:  Vitals:   10/04/20 0617  TempSrc:   PainSc: 0-No pain         Complications: No complications documented.

## 2020-10-04 NOTE — Interval H&P Note (Signed)
History and Physical Interval Note:  10/04/2020 7:11 AM  Dalton [name]  has presented today for surgery, with the diagnosis of AS AI BAV AFIB.  The various methods of treatment have been discussed with the patient and family. After consideration of risks, benefits and other options for treatment, the patient has consented to  Procedure(s): AORTIC VALVE REPLACEMENT (AVR) (N/A) MAZE (N/A) TRANSESOPHAGEAL ECHOCARDIOGRAM (TEE) (N/A) as a surgical intervention.  The patient's history has been reviewed, patient examined, no change in status, stable for surgery.  I have reviewed the patient's chart and labs.  Questions were answered to the patient's satisfaction.     Gaye Pollack

## 2020-10-04 NOTE — Hospital Course (Addendum)
History of Present Illness:  The patient is a 55 year old gentleman with a history of hypertension, hyperlipidemia, type 2 DM, OSA on CPAP, history of SVT status post ablation in 2013, and bicuspid aortic valve disease with aortic stenosis.  An echocardiogram in September 2020 showed a bicuspid aortic valve with mild to moderate aortic insufficiency and a pressure half-time of 484 ms.  The mean gradient across aortic valve is 12.4 with a peak gradient of 21 and a valve area of 1.9 cm consistent with moderate aortic stenosis.  Left ventricular ejection fraction was 45 to 50%.  LV internal dimension during diastole was 6.2 cm and during systole 5.0 cm.  His most recent 2D echocardiogram on 09/23/2019 showed a mean gradient across aortic valve of 16 mmHg with a peak gradient of 27 mmHg.  Dimensionless index was 0.27 consistent with moderate aortic stenosis.  Aortic insufficiency appears mild with a pressure half-time of 512 ms.  Left ventricular ejection fraction was 40 to 45%.  LV internal dimensions were 5.6 cm in diastole and 4.3 cm in systole.  On 11/16/2019 he underwent TEE which showed a bicuspid aortic valve with fusion of the non and right coronary cusp with a single raphae.  There was severely restricted movement of the conjoined leaflets.  The left coronary cusp was moving without restriction.  The mean gradient was 19 mmHg with an aortic valve area of 1.3 cm and dimensionless index of 0.29.  Aortic valve area by planimetry was 1.48 to 1.68 cm.  There was felt to be moderate aortic insufficiency.  Left ventricular ejection fraction was 45 to 50%.  He underwent cardiac catheterization on the same day which showed a mean gradient across aortic valve of 40 mmHg with a calculated valve area of 1.69 cm.  There was mild pulmonary hypertension at 34/18 with a mean wedge pressure of 19.  LVEDP was 21 mmHg.  There was no significant coronary disease. He had a follow-up echo on 09/06/2020 which I have reviewed.  It  is a poor quality study but essentially the same as his prior echo.  This shows a bicuspid aortic valve with heavily calcified leaflets with restricted mobility.  The mean gradient measured across the valve was 18.2 mmHg.  There is moderate aortic insufficiency.  Left ventricular ejection fraction still looks in the 45 to 50% range to me although it was read as 60 to 65% in the echo report.  CTA of the chest done 02/02/2020 had shown the ascending aorta to be mildly enlarged at 3.6 cm with an aortic root diameter of 3.3 cm.  The proximal descending thoracic aorta was 3 cm.   He continues to work full-time for [employer] and has a fairly strenuous job.  He does get some shortness of breath with moderate exertion and gets fatigued but is able to stop and rest and continue on.  He denies any chest pain or pressure.  He has had no orthopnea.  He denies peripheral edema.  He has had chronic atrial fibrillation for years and is on Eliquis.  He was evaluated by Dr. Cyndia Bent who felt the patient would benefit from Aortic Valve Replacement and MAZE procedure.  The risks and benefits of the procedure were explained to the patient and he was agreeable to proceed.  Hospital Course:   Mr. Minahan presented to [facility] on 10[facility]04[facility]2020.  He was taken to the operating room and underwent Aortic Valve Replacement with a 25 mm Edwards Resilia Bioprosthetic valve.   He  underwent MAZE procedure with Radiofrequency ablation and cryothermy of a Bi-Atrial lesion set.  He also had a clip applied to his left atrial appendage with a 45 mm Atricure Pro Clip 2.  He tolerated the procedure without difficulty and was taken to the SICU in stable condition.   He was initiated on Amiodarone post operatively.   Postoperative hospital course:  The patient was weaned from the ventilator using standard post cardiac surgical protocols without difficulty.  He is remained neurologically intact.  He initially did require a little Neo-Synephrine and  dopamine.  For blood pressure support but this was weaned without difficulty.  Swan-Ganz was removed on postop day 1.  Also on postop day #1 he was noted to be in a sinus bradycardia requiring atrial pacing.  Beta-blocker was not initiated.  Ultimately it did require discontinuing amiodarone as well.  On postoperative day 2 he was noted to be back in sinus rhythm with a rate in the 70s.  He also showed some hypertensive readings and was restarted back on his preoperative ACE inhibitor.  He is noted to have a mild expected acute blood loss anemia which is stabilizing.  He has required some gentle diuresis for volume overload.  Renal function has remained within normal limits.

## 2020-10-04 NOTE — Brief Op Note (Signed)
10/04/2020  11:27 AM  PATIENT:  Dalton [name]  55 y.o. male  PRE-OPERATIVE DIAGNOSIS:  AORTIC STENOSIS, AORTIC INSUFFICIENCY BICUSPID AORTIC VALVE,ATRIAL FIBRILLATION  POST-OPERATIVE DIAGNOSIS:  AORTIC STENOSIS, AORTIC INSUFFICIENCY BICUSPID AORTIC VALVE,ATRIAL FIBRILLATION  PROCEDURE:  Procedure(s):  AORTIC VALVE REPLACEMENT -25 mm Edwards Resilia Bioprosthetic Valve  MAZE (N/A) -Complete Bi-Atrial Lesion Set with Cryothermy and Radiofrequency Ablation -Clipping of LA Appendage with a 45 mm Atricure Pro Clip 2  TRANSESOPHAGEAL ECHOCARDIOGRAM (TEE) (N/A)  SURGEON:  Surgeon(s) and Role:    * Bartle, Fernande Boyden, MD - Primary  PHYSICIAN ASSISTANT: Ellwood Handler PA-C  ANESTHESIA:   general  EBL: Per anesthesia record  BLOOD ADMINISTERED: CELLSAVER  DRAINS: Mediastinal Chest Drains   LOCAL MEDICATIONS USED:  NONE  SPECIMEN:  Source of Specimen:  Aortic Valve Leaflets  DISPOSITION OF SPECIMEN:  PATHOLOGY  COUNTS:  YES  TOURNIQUET:  * No tourniquets in log *  DICTATION: .Dragon Dictation  PLAN OF CARE: Admit to inpatient   PATIENT DISPOSITION:  ICU - intubated and hemodynamically stable.   Delay start of Pharmacological VTE agent (>24hrs) due to surgical blood loss or risk of bleeding: yes

## 2020-10-04 NOTE — Anesthesia Procedure Notes (Signed)
Central Venous Catheter Insertion Performed by: Belinda Block, MD, anesthesiologist Start/End3/31/2022 6:45 AM, 10/04/2020 7:00 AM Patient location: Pre-op. Preanesthetic checklist: patient identified, IV checked, site marked, risks and benefits discussed, surgical consent, monitors and equipment checked, pre-op evaluation, timeout performed and anesthesia consent Position: Trendelenburg Lidocaine 1% used for infiltration and patient sedated Hand hygiene performed  and maximum sterile barriers used  PA cath was placed.Swan type:thermodilution Procedure performed without using ultrasound guided technique. Attempts: 1 Following insertion, line sutured, dressing applied and Biopatch. Post procedure assessment: blood return through all ports  Patient tolerated the procedure well with no immediate complications.

## 2020-10-04 NOTE — Anesthesia Procedure Notes (Signed)
Procedure Name: Intubation Date/Time: 10/04/2020 7:43 AM Performed by: Candis Shine, CRNA Pre-anesthesia Checklist: Patient identified, Emergency Drugs available, Suction available and Patient being monitored Patient Re-evaluated:Patient Re-evaluated prior to induction Oxygen Delivery Method: Circle System Utilized Preoxygenation: Pre-oxygenation with 100% oxygen Induction Type: IV induction Ventilation: Oral airway inserted - appropriate to patient size and Two handed mask ventilation required Laryngoscope Size: Mac and 4 Grade View: Grade II Tube type: Oral Tube size: 8.0 mm Number of attempts: 1 Airway Equipment and Method: Stylet and Oral airway Placement Confirmation: ETT inserted through vocal cords under direct vision,  positive ETCO2 and breath sounds checked- equal and bilateral Secured at: 23 cm Tube secured with: Tape Dental Injury: Teeth and Oropharynx as per pre-operative assessment

## 2020-10-04 NOTE — Discharge Instructions (Signed)

## 2020-10-04 NOTE — Procedures (Signed)
Extubation Procedure Note  Patient Details:   Name: Dalton [name] DOB: 02-06-1966 MRN: [id number]   Airway Documentation:  Airway 8 mm (Active)  Secured at (cm) 22 cm 10/04/20 1910  Measured From Lips 10/04/20 1910  Secured Location Right 10/04/20 1910  Secured By Pink Tape 10/04/20 1910  Prone position No 10/04/20 1910  Cuff Pressure (cm H2O) Clear OR 27-39 CmH2O 10/04/20 1910  Site Condition Dry 10/04/20 1910   10/04/20  1941  Evaluation  O2 sats: stable throughout Complications: No apparent complications Patient did tolerate procedure well. Bilateral Breath Sounds: Clear,Diminished   Yes  Pt. Extubated to 4L [place], pt. Able to say name, no stridor noted, pos. Cuff leak. VC 827, nif -23. VSS at this time, RT will continue to monitor  Gifford Shave 10/04/2020, 7:45 PM

## 2020-10-04 NOTE — Progress Notes (Signed)
RT NOTES: Rapid Wean Protocol initiated.

## 2020-10-04 NOTE — Progress Notes (Signed)
Pt CI less than 1.8 since arrival to CVICU. 3 albumins given. Dopamine gtt started per MD order. Okay to wean to extubate with CI less than 1.8 per Dr Cyndia Bent.

## 2020-10-04 NOTE — Op Note (Signed)
CARDIOVASCULAR SURGERY OPERATIVE NOTE  10/04/2020 Dalton [name] [id number]  Surgeon:  Gaye Pollack, MD  First Assistant: Ellwood Handler,  PA-C   Preoperative Diagnosis:  Bicuspid aortic valve with moderate aortic stenosis and moderate aortic insufficiency, long-standing persistent atrial fibrillation.   Postoperative Diagnosis:  Same   Procedure:  1. Median Sternotomy 2. Extracorporeal circulation 3.   Aortic valve replacement using a 25 mm Edwards INSPIRIS RESILIA pericardial valve. 4.   Bi-atrial MAZE procedure using radiofrequency and cryothermy.  5.   Clipping of left atrial appendage.  Anesthesia:  General Endotracheal   Clinical History/Surgical Indication:   This gentleman has symptomatic combined moderate aortic stenosis and aortic insufficiency with a bicuspid aortic valve. He has New York Heart Association class II symptoms of exertional fatigue and shortness of breath. He has had a progressive decrease in his left ventricular ejection fraction since 2015 when it was 60 to 65%. At that time he actually had a higher mean gradient across the aortic valve of 25 mmHg and normal left ventricular internal dimensions. Since then his ejection fraction has continued to fall to 40% with increased left ventricular internal dimensions. His diastolic dimension in September 2020 was measured at 6 cm and it was 5.6 cm by echo in March 2021. He has decreased left ventricular systolic function may be due to the combination of aortic stenosis and insufficiency over time as well as the effects of atrial fibrillation and OSA. I think it may be best to proceed with aortic valve replacement before his left ventricle continues to deteriorate further. This should improve his symptoms and prevent progressive left ventricular deterioration. The gradient across the aortic valve may be lower than expected due to his reduced ejection fraction.  CT scan of the chest showed the ascending aorta  to be minimally enlarged at 3.6 cm with an aortic root diameter 3.3 cm.  This does not require replacement.It was noted to be around 4 cm on echo. I think he may also benefit from a Maze procedure to try to keep him in sinus rhythm. I do not think he would be a candidate for transcatheter aortic valve replacement given his young age and bicuspid valve morphology. I reviewed the echocardiogram images and measurements with him and his wife and my recommendation to proceed with open surgical aortic valve replacement and Maze procedure. I discussed the options of mechanical and bioprosthetic valves. We have decided to use a bioprosthetic valve so that he does not need to be on lifelong Coumadin.I discussed the operative procedure with the patient andhis wifeincluding alternatives, benefits and risks; including but not limited to bleeding, blood transfusion, infection, stroke, myocardial infarction, graft failure, heart block requiring a permanent pacemaker, organ dysfunction, and death  Preparation:  The patient was seen in the preoperative holding area and the correct patient, correct operation were confirmed with the patient after reviewing the medical record and catheterization. The consent was signed by me. Preoperative antibiotics were given. A pulmonary arterial line and radial arterial line were placed by the anesthesia team. The patient was taken back to the operating room and positioned supine on the operating room table. After being placed under general endotracheal anesthesia by the anesthesia team a foley catheter was placed. The neck, chest, abdomen, and both legs were prepped with betadine soap and solution and draped in the usual sterile manner. A surgical time-out was taken and the correct patient and operative procedure were confirmed with the nursing and anesthesia staff.   Pre-bypass TEE:  Complete TEE assessment was performed by Dr. Jana [name]. This showed moderate AS and AI  with a bicuspid aortic valve. There was fusion of the right and non-coronary leaflets. LVEF was reduced to 40-45%.    Post-bypass TEE:   Normal functioning prosthetic aortic valve with no perivalvular leak or regurgitation through the valve. Mean gradient 4 mm Hg. Left ventricular function unchanged. Trivial mitral regurgitation.    Cardiopulmonary Bypass:  A median sternotomy was performed. The pericardium was opened in the midline. Right ventricular function appeared normal. The ascending aorta was of normal size and had no palpable plaque. There were no contraindications to aortic cannulation or cross-clamping. The patient was fully systemically heparinized and the ACT was maintained > 400 sec. The proximal aortic arch was cannulated with a 31 F aortic cannula for arterial inflow. Bi-caval venous cannulation was performed using a 33F metal tip right angle cannula in the SVC and a 36 F plastic cannula in the IVC.  An antegrade cardioplegia/vent cannula was inserted into the mid-ascending aorta. A retrograde cardioplegia cannnula was placed into the coronary sinus via the right atrium. Aortic occlusion was performed with a single cross-clamp. Systemic cooling to 32 degrees Centigrade and topical cooling of the heart with iced saline were used. Hyperkalemic antegrade cold blood cardioplegia was used to induce diastolic arrest and then cold blood retrograde cardioplegia was given at about 20 minute intervals throughout the period of arrest to maintain myocardial temperature at or below 10 degrees centigrade. A temperature probe was inserted into the interventricular septum and an insulating pad was placed in the pericardium. Carbon dioxide was insufflated into the pericardium at 5L/min throughout the procedure to minimize intracardiac air.   Maze Procedure:  The left pulmonary veins were circled with a tape. An Atricure bipolar RF clamp was used to create a lesion around the pulmonary veins on the  left atrial wall. Three applications of RF energy at each lesion site with two applications of the clamp for each were applied for each lesion. The base of the LAA appendage was measured and a 45 mm Atricure Atriclip Pro 2 was applied to the base of the appendage to isolate it. A cryo lesion was then performed from the base of the LAA down to the pulmonary vein lesion. The left atrium was opened by a vertical incision in the interatrial groove. An encircling lesion was then created around the right pulmonary veins using the atriotomy incision anteriorly and a cryo lesion posteriorly. All cryo lesions were for two minutes. Then cryo lesions were created to join the pulmonary vein lesions superiorly and inferiorly creating a posterior box.  A cryo lesion was placed between the inferior lesion that joined the pulmonary veins and the posterior mitral annulus at P2-3. A lesion was placed externally across the coronary sinus overlapping this. The left atrium was closed with two layers of continuous 3-0 prolene suture. The right atrium was opened obliquely  An RF lesion was created to join the SVC and IVC posteriorly along the interatrial septum.  A cryo lesion was placed from the anterior aspect of this oblique atriotomy down to the tricuspid annulus. Another cryo lesion was placed obliquely across the RAA. The right atriotomy was then closed with two layers of 4-0 prolene suture.    Aortic Valve Replacement:   A transverse aortotomy was performed 1 cm above the take-off of the right coronary artery. The native valve was bicuspid with fusion of the right and non-coronary leaflets with a single raphe.  The leaflets were thickened with multiple fenestrations at the commissures, some of which were ruptured.  The ostia of the coronary arteries were in normal position and were not obstructed. The native valve leaflets were excised and the annulus was decalcified with rongeurs. Care was taken to remove all particulate  debris. The left ventricle was directly inspected for debris and then irrigated with ice saline solution. The annulus was sized and a size 25 mm Edwards INSPIRIS RESILIA valve was chosen. The model number was [id number] and the serial number was [id number]. While the valve was being prepared 2-0 Ethibond pledgeted horizontal mattress sutures were placed around the annulus with the pledgets in a sub-annular position. The sutures were placed through the sewing ring and the valve lowered into place. The sutures were tied using the Cor-Knot device. The valve seated nicely and the coronary ostia were not obstructed. The prosthetic valve leaflets moved normally and there was no sub-valvular obstruction. The aortotomy was closed using 4-0 Prolene suture in 2 layers with felt strips to reinforce the closure.  Completion:  The patient was rewarmed to 37 degrees Centigrade. De-airing maneuvers were performed and the head placed in trendelenburg position. The crossclamp was removed with a time of 165 minutes. There was spontaneous return of sinus rhythm. The aortotomy was checked for hemostasis. Two temporary epicardial pacing wires were placed on the right atrium and two on the right ventricle. The left ventricular vent and retrograde cardioplegia cannulas were removed. The patient was weaned from CPB without difficulty on no inotropes. CPB time was 193 minutes. Cardiac output was 5 LPM. Heparin was fully reversed with protamine and the aortic and venous cannulas removed. Hemostasis was achieved. Mediastinal drainage tubes were placed. The sternum was closed with double #6 stainless steel wires. The fascia was closed with continuous # 1 vicryl suture. The subcutaneous tissue was closed with 2-0 vicryl continuous suture. The skin was closed with 3-0 vicryl subcuticular suture. All sponge, needle, and instrument counts were reported correct at the end of the case. Dry sterile dressings were placed over the incisions and around the  chest tubes which were connected to pleurevac suction. The patient was then transported to the surgical intensive care unit in stable condition.

## 2020-10-04 NOTE — Anesthesia Procedure Notes (Signed)
Central Venous Catheter Insertion Performed by: Belinda Block, MD, anesthesiologist Start/End3/31/2022 6:45 AM, 10/04/2020 7:00 AM Patient location: Pre-op. Preanesthetic checklist: patient identified, IV checked, site marked, risks and benefits discussed, surgical consent, monitors and equipment checked, pre-op evaluation, timeout performed and anesthesia consent Position: Trendelenburg Lidocaine 1% used for infiltration and patient sedated Hand hygiene performed , maximum sterile barriers used  and Seldinger technique used Catheter size: 8.5 Fr PA cath was placed.Sheath introducer Procedure performed using ultrasound guided technique. Ultrasound Notes:anatomy identified, needle tip was noted to be adjacent to the nerve/plexus identified, no ultrasound evidence of intravascular and/or intraneural injection and image(s) printed for medical record Attempts: 1 Following insertion, line sutured and dressing applied. Post procedure assessment: blood return through all ports, free fluid flow and no air  Patient tolerated the procedure well with no immediate complications.

## 2020-10-04 NOTE — Anesthesia Procedure Notes (Signed)
Arterial Line Insertion Start/End3/31/2022 6:50 AM Performed by: Candis Shine, CRNA, CRNA  Patient location: Pre-op. Preanesthetic checklist: patient identified, IV checked, site marked, risks and benefits discussed, surgical consent, monitors and equipment checked, pre-op evaluation, timeout performed and anesthesia consent Lidocaine 1% used for infiltration Right, radial was placed Catheter size: 20 G Hand hygiene performed  and maximum sterile barriers used   Attempts: 1 Procedure performed without using ultrasound guided technique. Following insertion, dressing applied and Biopatch. Post procedure assessment: normal and unchanged  Patient tolerated the procedure well with no immediate complications.

## 2020-10-04 NOTE — Anesthesia Postprocedure Evaluation (Signed)
Anesthesia Post Note  Patient: Dalton [name]  Procedure(s) Performed: AORTIC VALVE REPLACEMENT (AVR) (N/A Chest) MAZE (N/A Chest) TRANSESOPHAGEAL ECHOCARDIOGRAM (TEE) (N/A )     Patient location during evaluation: SICU Anesthesia Type: General Level of consciousness: sedated Pain management: pain level controlled Vital Signs Assessment: post-procedure vital signs reviewed and stable Respiratory status: patient remains intubated per anesthesia plan Cardiovascular status: stable Postop Assessment: no apparent nausea or vomiting Anesthetic complications: no   No complications documented.  Last Vitals:  Vitals:   10/04/20 0722 10/04/20 1430  BP: 119/67 115/82  Pulse: 67   Resp: 13   Temp:    SpO2: 99% 100%    Last Pain:  Vitals:   10/04/20 0617  TempSrc:   PainSc: 0-No pain                 March Rummage Davona Kinoshita

## 2020-10-04 NOTE — Progress Notes (Signed)
RT note. Pt. Placed on auto titrate CPAP 18/5 with 4L bled in. VSS, RN aware, RT will continue to monitor,

## 2020-10-05 ENCOUNTER — Inpatient Hospital Stay (HOSPITAL_COMMUNITY): Payer: 59

## 2020-10-05 ENCOUNTER — Encounter (HOSPITAL_COMMUNITY): Payer: Self-pay | Admitting: Surgery

## 2020-10-05 LAB — GLUCOSE, CAPILLARY
Glucose-Capillary: 113 mg/dL — ABNORMAL HIGH (ref 70–99)
Glucose-Capillary: 115 mg/dL — ABNORMAL HIGH (ref 70–99)
Glucose-Capillary: 122 mg/dL — ABNORMAL HIGH (ref 70–99)
Glucose-Capillary: 134 mg/dL — ABNORMAL HIGH (ref 70–99)
Glucose-Capillary: 140 mg/dL — ABNORMAL HIGH (ref 70–99)
Glucose-Capillary: 142 mg/dL — ABNORMAL HIGH (ref 70–99)
Glucose-Capillary: 143 mg/dL — ABNORMAL HIGH (ref 70–99)
Glucose-Capillary: 150 mg/dL — ABNORMAL HIGH (ref 70–99)
Glucose-Capillary: 154 mg/dL — ABNORMAL HIGH (ref 70–99)
Glucose-Capillary: 155 mg/dL — ABNORMAL HIGH (ref 70–99)
Glucose-Capillary: 175 mg/dL — ABNORMAL HIGH (ref 70–99)
Glucose-Capillary: 181 mg/dL — ABNORMAL HIGH (ref 70–99)
Glucose-Capillary: 181 mg/dL — ABNORMAL HIGH (ref 70–99)
Glucose-Capillary: 194 mg/dL — ABNORMAL HIGH (ref 70–99)

## 2020-10-05 LAB — POCT I-STAT 7, (LYTES, BLD GAS, ICA,H+H)
Acid-base deficit: 4 mmol/L — ABNORMAL HIGH (ref 0.0–2.0)
Bicarbonate: 22.4 mmol/L (ref 20.0–28.0)
Calcium, Ion: 1.21 mmol/L (ref 1.15–1.40)
HCT: 41 % (ref 39.0–52.0)
Hemoglobin: 13.9 g/dL (ref 13.0–17.0)
O2 Saturation: 95 %
Patient temperature: 37.9
Potassium: 4.6 mmol/L (ref 3.5–5.1)
Sodium: 142 mmol/L (ref 135–145)
TCO2: 24 mmol/L (ref 22–32)
pCO2 arterial: 45.5 mmHg (ref 32.0–48.0)
pH, Arterial: 7.305 — ABNORMAL LOW (ref 7.350–7.450)
pO2, Arterial: 90 mmHg (ref 83.0–108.0)

## 2020-10-05 LAB — CBC
HCT: 41.4 % (ref 39.0–52.0)
HCT: 42.3 % (ref 39.0–52.0)
Hemoglobin: 13.8 g/dL (ref 13.0–17.0)
Hemoglobin: 14 g/dL (ref 13.0–17.0)
MCH: 32.2 pg (ref 26.0–34.0)
MCH: 32.3 pg (ref 26.0–34.0)
MCHC: 33.3 g/dL (ref 30.0–36.0)
MCHC: 33.1 g/dL (ref 30.0–36.0)
MCV: 96.7 fL (ref 80.0–100.0)
MCV: 97.7 fL (ref 80.0–100.0)
Platelets: 105 10*3/uL — ABNORMAL LOW (ref 150–400)
Platelets: 97 10*3/uL — ABNORMAL LOW (ref 150–400)
RBC: 4.28 MIL/uL (ref 4.22–5.81)
RBC: 4.33 MIL/uL (ref 4.22–5.81)
RDW: 14.4 % (ref 11.5–15.5)
RDW: 14.6 % (ref 11.5–15.5)
WBC: 15.7 10*3/uL — ABNORMAL HIGH (ref 4.0–10.5)
WBC: 20.5 10*3/uL — ABNORMAL HIGH (ref 4.0–10.5)
nRBC: 0 % (ref 0.0–0.2)
nRBC: 0 % (ref 0.0–0.2)

## 2020-10-05 LAB — BASIC METABOLIC PANEL
Anion gap: 6 (ref 5–15)
Anion gap: 10 (ref 5–15)
BUN: 12 mg/dL (ref 6–20)
BUN: 16 mg/dL (ref 6–20)
CO2: 21 mmol/L — ABNORMAL LOW (ref 22–32)
CO2: 21 mmol/L — ABNORMAL LOW (ref 22–32)
Calcium: 8 mg/dL — ABNORMAL LOW (ref 8.9–10.3)
Calcium: 8.3 mg/dL — ABNORMAL LOW (ref 8.9–10.3)
Chloride: 109 mmol/L (ref 98–111)
Chloride: 103 mmol/L (ref 98–111)
Creatinine, Ser: 1.11 mg/dL (ref 0.61–1.24)
Creatinine, Ser: 1.41 mg/dL — ABNORMAL HIGH (ref 0.61–1.24)
GFR, Estimated: >60 mL/min (ref >60)
GFR, Estimated: 59 mL/min — ABNORMAL LOW (ref >60)
Glucose, Bld: 152 mg/dL — ABNORMAL HIGH (ref 70–99)
Glucose, Bld: 171 mg/dL — ABNORMAL HIGH (ref 70–99)
Potassium: 4.7 mmol/L (ref 3.5–5.1)
Potassium: 5.1 mmol/L (ref 3.5–5.1)
Sodium: 136 mmol/L (ref 135–145)
Sodium: 134 mmol/L — ABNORMAL LOW (ref 135–145)

## 2020-10-05 LAB — MAGNESIUM
Magnesium: 2.8 mg/dL — ABNORMAL HIGH (ref 1.7–2.4)
Magnesium: 2.5 mg/dL — ABNORMAL HIGH (ref 1.7–2.4)

## 2020-10-05 LAB — SURGICAL PATHOLOGY

## 2020-10-05 MED ORDER — ENOXAPARIN SODIUM 40 MG/0.4ML ~~LOC~~ SOLN
40.0000 mg | Freq: Every day | SUBCUTANEOUS | Status: DC
  Administered 2020-10-05 – 2020-10-09 (×5): 40 mg via SUBCUTANEOUS
  Filled 2020-10-05 (×5): qty 0.4

## 2020-10-05 MED ORDER — AMIODARONE HCL 200 MG PO TABS
200.0000 mg | ORAL_TABLET | Freq: Two times a day (BID) | ORAL | Status: DC
  Administered 2020-10-05 (×2): 200 mg via ORAL
  Filled 2020-10-05 (×2): qty 1

## 2020-10-05 MED ORDER — ORAL CARE MOUTH RINSE
15.0000 mL | Freq: Two times a day (BID) | OROMUCOSAL | Status: DC
  Administered 2020-10-05 – 2020-10-09 (×9): 15 mL via OROMUCOSAL

## 2020-10-05 MED ORDER — FUROSEMIDE 10 MG/ML IJ SOLN
40.0000 mg | Freq: Once | INTRAMUSCULAR | Status: AC
  Administered 2020-10-05: 40 mg via INTRAVENOUS
  Filled 2020-10-05: qty 4

## 2020-10-05 MED ORDER — HYDRALAZINE HCL 20 MG/ML IJ SOLN
10.0000 mg | Freq: Four times a day (QID) | INTRAMUSCULAR | Status: DC | PRN
  Administered 2020-10-05: 10 mg via INTRAVENOUS
  Filled 2020-10-05: qty 1

## 2020-10-05 MED ORDER — SODIUM CHLORIDE 0.9% FLUSH: 10.0000 mL | INTRAVENOUS | Status: DC | PRN

## 2020-10-05 MED ORDER — AMLODIPINE BESYLATE 10 MG PO TABS
10.0000 mg | ORAL_TABLET | Freq: Every day | ORAL | Status: DC
  Administered 2020-10-05 – 2020-10-10 (×6): 10 mg via ORAL
  Filled 2020-10-05 (×6): qty 1

## 2020-10-05 MED ORDER — ALBUTEROL SULFATE (2.5 MG/3ML) 0.083% IN NEBU
2.5000 mg | INHALATION_SOLUTION | Freq: Four times a day (QID) | RESPIRATORY_TRACT | Status: DC | PRN
  Administered 2020-10-05: 2.5 mg via RESPIRATORY_TRACT
  Filled 2020-10-05: qty 3

## 2020-10-05 MED ORDER — INSULIN DETEMIR 100 UNIT/ML ~~LOC~~ SOLN
40.0000 [IU] | Freq: Every day | SUBCUTANEOUS | Status: DC
  Administered 2020-10-06 – 2020-10-09 (×4): 40 [IU] via SUBCUTANEOUS
  Filled 2020-10-05 (×5): qty 0.4

## 2020-10-05 MED ORDER — INSULIN DETEMIR 100 UNIT/ML ~~LOC~~ SOLN
40.0000 [IU] | Freq: Once | SUBCUTANEOUS | Status: AC
  Administered 2020-10-05: 40 [IU] via SUBCUTANEOUS
  Filled 2020-10-05: qty 0.4

## 2020-10-05 MED ORDER — INSULIN DETEMIR 100 UNIT/ML ~~LOC~~ SOLN: 40.0000 [IU] | Freq: Every day | SUBCUTANEOUS | Status: DC

## 2020-10-05 MED ORDER — INSULIN ASPART 100 UNIT/ML ~~LOC~~ SOLN
0.0000 [IU] | SUBCUTANEOUS | Status: DC
  Administered 2020-10-05 (×3): 4 [IU] via SUBCUTANEOUS
  Administered 2020-10-06: 2 [IU] via SUBCUTANEOUS
  Administered 2020-10-06: 4 [IU] via SUBCUTANEOUS
  Administered 2020-10-06: 2 [IU] via SUBCUTANEOUS

## 2020-10-05 MED ORDER — SODIUM CHLORIDE 0.9% FLUSH
10.0000 mL | Freq: Two times a day (BID) | INTRAVENOUS | Status: DC
  Administered 2020-10-05 – 2020-10-06 (×3): 10 mL

## 2020-10-05 MED FILL — Thrombin (Recombinant) For Soln 20000 Unit: CUTANEOUS | Qty: 1 | Status: AC

## 2020-10-05 NOTE — Progress Notes (Signed)
Patient is s/p AVR & Maze Procedure.  Patient's SBP is now in the 150s and DBP 100's paging TCT.

## 2020-10-05 NOTE — Progress Notes (Signed)
1 Day Post-Op Procedure(s) (LRB): AORTIC VALVE REPLACEMENT (AVR) (N/A) MAZE (N/A) TRANSESOPHAGEAL ECHOCARDIOGRAM (TEE) (N/A) Subjective: No complaints  Objective: Vital signs in last 24 hours: Temp:  [98.42 F (36.9 C)-101.5 F (38.6 C)] 98.6 F (37 C) (04/01 0630) Pulse Rate:  [44-89] 70 (04/01 0630) Cardiac Rhythm: Normal sinus rhythm (04/01 0400) Resp:  [12-29] 16 (04/01 0630) BP: (88-129)/(59-97) 129/88 (04/01 0600) SpO2:  [94 %-100 %] 95 % (04/01 0630) Arterial Line BP: (83-132)/(52-80) 109/68 (04/01 0630) FiO2 (%):  [40 %-50 %] 40 % (03/31 1910) Weight:  [121.2 kg] 121.2 kg (04/01 0637)  Hemodynamic parameters for last 24 hours: PAP: (21-52)/(13-29) 43/25 CVP:  [13 mmHg] 13 mmHg CO:  [2.5 L/min-4.3 L/min] 4.3 L/min CI:  [1 L/min/m2-1.8 L/min/m2] 1.8 L/min/m2  Intake/Output from previous day: 03/31 0701 - 04/01 0700 In: 5181.4 [P.O.:340; I.V.:3039.4; Blood:905; IV EPPIRJJOA:416] Out: 6063 [Urine:3405; Chest Tube:300] Intake/Output this shift: No intake/output data recorded.  General appearance: alert and cooperative Neurologic: intact Heart: regular rate and rhythm, S1, S2 normal, no murmur, click, rub or gallop Lungs: clear to auscultation bilaterally Extremities: edema mild Wound: dressings dry  Lab Results: Recent Labs    10/04/20 2032 10/04/20 2122 10/05/20 0030 10/05/20 0414  WBC 16.2*  --   --  15.7*  HGB 14.5   < > 13.9 13.8  HCT 44.0   < > 41.0 41.4  PLT 111*  --   --  105*   < > = values in this interval not displayed.   BMET:  Recent Labs    10/04/20 2032 10/04/20 2122 10/05/20 0030 10/05/20 0414  NA 139   < > 142 136  K 4.5   < > 4.6 4.7  CL 111  --   --  109  CO2 23  --   --  21*  GLUCOSE 143*  --   --  152*  BUN 11  --   --  12  CREATININE 1.15  --   --  1.11  CALCIUM 8.0*  --   --  8.0*   < > = values in this interval not displayed.    PT/INR:  Recent Labs    10/04/20 1441  LABPROT 16.4*  INR 1.4*   ABG    Component  Value Date/Time   PHART 7.305 (L) 10/05/2020 0030   HCO3 22.4 10/05/2020 0030   TCO2 24 10/05/2020 0030   ACIDBASEDEF 4.0 (H) 10/05/2020 0030   O2SAT 95.0 10/05/2020 0030   CBG (last 3)  Recent Labs    10/05/20 0409 10/05/20 0540 10/05/20 0625  GLUCAP 150* 154* 140*   CXR: mild bibasilar atelectasis  ECG: postop sinus 84. Pending this am.  Assessment/Plan: S/P Procedure(s) (LRB): AORTIC VALVE REPLACEMENT (AVR) (N/A) MAZE (N/A) TRANSESOPHAGEAL ECHOCARDIOGRAM (TEE) (N/A)  POD 1 Hemodynamically stable on dop 3. Neo weaned off. Dalton [name] numbers have been inaccurate. Will remove. Wean dopamine as tolerated for MAP>65.  Persistent atrial fib: has been in sinus rhythm post-MAZE. This am Dalton [name] is in sinus bradycardia 40's and Dalton [name] is being atrial paced 80. Will hold off on beta blocker. Amio started empirically postop and will switch to oral today. If sinus rate remains low I would stop this. My plan was to keep on amio for 6 wks postop but if bradycardia will stop it. Plan to resume Eliquis at discharge.  DM: preop Hgb A1c was 6.1 on preop regimen. Will plan to resume preop regimen at discharge. Transition to Levemir and SSI for now.  DC  chest tubes, swan, arterial line.  OOB, IS  Dalton [name] had injury to right great toe a few weeks ago and surgery delayed to let that heal some. It looks ok but may make it more difficult to ambulate.   LOS: 1 day    Dalton [name] 10/05/2020

## 2020-10-05 NOTE — Progress Notes (Signed)
      [address]       [place] [address]             [phone]      POD # 1 AVR, Maze  BP (!) 136/114   Pulse 69   Temp 97.9 F (36.6 C) (Oral)   Resp (!) 21   Ht 6\' 1"  (1.854 m)   Wt 121.2 kg   SpO2 100%   BMI 35.25 kg/m  2L [place] 97% sat  Intake/Output Summary (Last 24 hours) at 10/05/2020 1948 Last data filed at 10/05/2020 1800 Gross per 24 hour  Intake 3226.96 ml  Output 1400 ml  Net 1826.96 ml   CBG moderately elevated  Creatinine 1.41 from 1.11, K= 5.1 Hct = 42, PLT down to 97K  Hypertension, bradycardic- will start Norvasc and use PRN hydralazine  Remo Lipps C. Roxan Hockey, MD [facility] [phone]

## 2020-10-05 NOTE — Progress Notes (Signed)
Called TCTS: Oncall PA, Jadene Pierini.   Patient having expiratory wheezing.  New verbal order received.  Albuterol q6 hours PRN.

## 2020-10-06 ENCOUNTER — Inpatient Hospital Stay (HOSPITAL_COMMUNITY): Payer: 59

## 2020-10-06 LAB — BASIC METABOLIC PANEL
Anion gap: 14 (ref 5–15)
BUN: 18 mg/dL (ref 6–20)
CO2: 16 mmol/L — ABNORMAL LOW (ref 22–32)
Calcium: 8.4 mg/dL — ABNORMAL LOW (ref 8.9–10.3)
Chloride: 104 mmol/L (ref 98–111)
Creatinine, Ser: 1.39 mg/dL — ABNORMAL HIGH (ref 0.61–1.24)
GFR, Estimated: 60 mL/min — ABNORMAL LOW (ref >60)
Glucose, Bld: 155 mg/dL — ABNORMAL HIGH (ref 70–99)
Potassium: 5.8 mmol/L — ABNORMAL HIGH (ref 3.5–5.1)
Sodium: 134 mmol/L — ABNORMAL LOW (ref 135–145)

## 2020-10-06 LAB — GLUCOSE, CAPILLARY
Glucose-Capillary: 138 mg/dL — ABNORMAL HIGH (ref 70–99)
Glucose-Capillary: 141 mg/dL — ABNORMAL HIGH (ref 70–99)
Glucose-Capillary: 170 mg/dL — ABNORMAL HIGH (ref 70–99)

## 2020-10-06 LAB — CBC
HCT: 38.5 % — ABNORMAL LOW (ref 39.0–52.0)
Hemoglobin: 13 g/dL (ref 13.0–17.0)
MCH: 32.2 pg (ref 26.0–34.0)
MCHC: 33.8 g/dL (ref 30.0–36.0)
MCV: 95.3 fL (ref 80.0–100.0)
Platelets: 90 10*3/uL — ABNORMAL LOW (ref 150–400)
RBC: 4.04 MIL/uL — ABNORMAL LOW (ref 4.22–5.81)
RDW: 14.6 % (ref 11.5–15.5)
WBC: 16.8 10*3/uL — ABNORMAL HIGH (ref 4.0–10.5)
nRBC: 0 % (ref 0.0–0.2)

## 2020-10-06 MED ORDER — SODIUM CHLORIDE 0.9% FLUSH
3.0000 mL | INTRAVENOUS | Status: DC | PRN
  Administered 2020-10-06: 3 mL via INTRAVENOUS

## 2020-10-06 MED ORDER — INSULIN ASPART 100 UNIT/ML ~~LOC~~ SOLN: 0.0000 [IU] | Freq: Every day | SUBCUTANEOUS | Status: DC

## 2020-10-06 MED ORDER — SODIUM CHLORIDE 0.9% FLUSH
3.0000 mL | Freq: Two times a day (BID) | INTRAVENOUS | Status: DC
  Administered 2020-10-07 – 2020-10-09 (×6): 3 mL via INTRAVENOUS

## 2020-10-06 MED ORDER — FUROSEMIDE 10 MG/ML IJ SOLN
40.0000 mg | Freq: Once | INTRAMUSCULAR | Status: AC
  Administered 2020-10-06: 40 mg via INTRAVENOUS
  Filled 2020-10-06: qty 4

## 2020-10-06 MED ORDER — SODIUM CHLORIDE 0.9 % IV SOLN: 250.0000 mL | INTRAVENOUS | Status: DC | PRN

## 2020-10-06 MED ORDER — ~~LOC~~ CARDIAC SURGERY, PATIENT & FAMILY EDUCATION: Freq: Once | Status: DC

## 2020-10-06 MED ORDER — ZOLPIDEM TARTRATE 5 MG PO TABS
5.0000 mg | ORAL_TABLET | Freq: Every evening | ORAL | Status: DC | PRN
  Administered 2020-10-09: 5 mg via ORAL
  Filled 2020-10-06: qty 1

## 2020-10-06 MED ORDER — INSULIN ASPART 100 UNIT/ML ~~LOC~~ SOLN
0.0000 [IU] | Freq: Three times a day (TID) | SUBCUTANEOUS | Status: DC
  Administered 2020-10-06 – 2020-10-07 (×2): 4 [IU] via SUBCUTANEOUS
  Administered 2020-10-07: 3 [IU] via SUBCUTANEOUS
  Administered 2020-10-07: 4 [IU] via SUBCUTANEOUS
  Administered 2020-10-08 – 2020-10-09 (×3): 3 [IU] via SUBCUTANEOUS

## 2020-10-06 MED ORDER — ASPIRIN EC 81 MG PO TBEC
81.0000 mg | DELAYED_RELEASE_TABLET | Freq: Every day | ORAL | Status: DC
  Administered 2020-10-06 – 2020-10-10 (×5): 81 mg via ORAL
  Filled 2020-10-06 (×5): qty 1

## 2020-10-06 MED ORDER — ALUM & MAG HYDROXIDE-SIMETH 200-200-20 MG/5ML PO SUSP: 15.0000 mL | Freq: Four times a day (QID) | ORAL | Status: DC | PRN

## 2020-10-06 MED ORDER — FUROSEMIDE 40 MG PO TABS
40.0000 mg | ORAL_TABLET | Freq: Every day | ORAL | Status: DC
  Administered 2020-10-07 – 2020-10-10 (×4): 40 mg via ORAL
  Filled 2020-10-06 (×4): qty 1

## 2020-10-06 MED ORDER — MAGNESIUM HYDROXIDE 400 MG/5ML PO SUSP: 30.0000 mL | Freq: Every day | ORAL | Status: DC | PRN

## 2020-10-06 NOTE — Progress Notes (Signed)
2 Days Post-Op Procedure(s) (LRB): AORTIC VALVE REPLACEMENT (AVR) (N/A) MAZE (N/A) TRANSESOPHAGEAL ECHOCARDIOGRAM (TEE) (N/A) Subjective: Some incisional pain, appetite fair  Objective: Vital signs in last 24 hours: Temp:  [97.1 F (36.2 C)-98.5 F (36.9 C)] 98.5 F (36.9 C) (04/02 0700) Pulse Rate:  [58-80] 70 (04/02 0800) Cardiac Rhythm: Atrial paced (04/01 2000) Resp:  [11-24] 13 (04/02 0800) BP: (119-166)/(89-114) 121/97 (04/02 0800) SpO2:  [90 %-100 %] 95 % (04/02 0800) Arterial Line BP: (135)/(93) 135/93 (04/01 1000) Weight:  [122.8 kg] 122.8 kg (04/02 0500)  Hemodynamic parameters for last 24 hours:    Intake/Output from previous day: 04/01 0701 - 04/02 0700 In: 2133.1 [P.O.:1200; I.V.:673.1; IV Piggyback:200] Out: 875 [Urine:845; Chest Tube:30] Intake/Output this shift: No intake/output data recorded.  General appearance: alert, cooperative and no distress Neurologic: intact Heart: regular rate and rhythm and paced Lungs: diminished breath sounds bibasilar Abdomen: normal findings: soft, non-tender Wound: clean and dry  Lab Results: Recent Labs    10/05/20 0414 10/05/20 1747  WBC 15.7* 20.5*  HGB 13.8 14.0  HCT 41.4 42.3  PLT 105* 97*   BMET:  Recent Labs    10/05/20 1747 10/06/20 0037  NA 134* 134*  K 5.1 5.8*  CL 103 104  CO2 21* 16*  GLUCOSE 171* 155*  BUN 16 18  CREATININE 1.41* 1.39*  CALCIUM 8.3* 8.4*    PT/INR:  Recent Labs    10/04/20 1441  LABPROT 16.4*  INR 1.4*   ABG    Component Value Date/Time   PHART 7.305 (L) 10/05/2020 0030   HCO3 22.4 10/05/2020 0030   TCO2 24 10/05/2020 0030   ACIDBASEDEF 4.0 (H) 10/05/2020 0030   O2SAT 95.0 10/05/2020 0030   CBG (last 3)  Recent Labs    10/05/20 2349 10/06/20 0353 10/06/20 0655  GLUCAP 181* 138* 141*    Assessment/Plan: S/P Procedure(s) (LRB): AORTIC VALVE REPLACEMENT (AVR) (N/A) MAZE (N/A) TRANSESOPHAGEAL ECHOCARDIOGRAM (TEE) (N/A) Plan for transfer to step-down:  see transfer orders  POD # 2 Doing well overall CV- A paced at 70 with normal conduction, junctional in 40s with PACs under pacer- will dc amiodarone  BP better with Norvasc RESP- IS for basilar atelectasis RENAL- creatinine 1.39 unchanged, did not diurese well yesterday   Give IV lasix this AM, Dc foley later today ENDO- CBG moderately elevated- continue levemir, change SSi to [facility] and HS  Will hold off on Po meds another day Thrombocytopenia- mild, monitor  LOS: 2 days    Melrose Nakayama 10/06/2020

## 2020-10-07 ENCOUNTER — Inpatient Hospital Stay (HOSPITAL_COMMUNITY): Payer: 59

## 2020-10-07 LAB — BASIC METABOLIC PANEL
Anion gap: 7 (ref 5–15)
BUN: 20 mg/dL (ref 6–20)
CO2: 26 mmol/L (ref 22–32)
Calcium: 8.4 mg/dL — ABNORMAL LOW (ref 8.9–10.3)
Chloride: 100 mmol/L (ref 98–111)
Creatinine, Ser: 1 mg/dL (ref 0.61–1.24)
GFR, Estimated: >60 mL/min (ref >60)
Glucose, Bld: 141 mg/dL — ABNORMAL HIGH (ref 70–99)
Potassium: 3.7 mmol/L (ref 3.5–5.1)
Sodium: 133 mmol/L — ABNORMAL LOW (ref 135–145)

## 2020-10-07 LAB — GLUCOSE, CAPILLARY
Glucose-Capillary: 144 mg/dL — ABNORMAL HIGH (ref 70–99)
Glucose-Capillary: 166 mg/dL — ABNORMAL HIGH (ref 70–99)
Glucose-Capillary: 161 mg/dL — ABNORMAL HIGH (ref 70–99)
Glucose-Capillary: 134 mg/dL — ABNORMAL HIGH (ref 70–99)
Glucose-Capillary: 158 mg/dL — ABNORMAL HIGH (ref 70–99)

## 2020-10-07 LAB — CBC
HCT: 35.2 % — ABNORMAL LOW (ref 39.0–52.0)
Hemoglobin: 12 g/dL — ABNORMAL LOW (ref 13.0–17.0)
MCH: 32.2 pg (ref 26.0–34.0)
MCHC: 34.1 g/dL (ref 30.0–36.0)
MCV: 94.4 fL (ref 80.0–100.0)
Platelets: 89 10*3/uL — ABNORMAL LOW (ref 150–400)
RBC: 3.73 MIL/uL — ABNORMAL LOW (ref 4.22–5.81)
RDW: 14.5 % (ref 11.5–15.5)
WBC: 10.4 10*3/uL (ref 4.0–10.5)
nRBC: 0 % (ref 0.0–0.2)

## 2020-10-07 MED ORDER — LISINOPRIL 10 MG PO TABS
20.0000 mg | ORAL_TABLET | Freq: Every day | ORAL | Status: DC
  Administered 2020-10-07 – 2020-10-10 (×4): 20 mg via ORAL
  Filled 2020-10-07 (×4): qty 2

## 2020-10-07 MED ORDER — METFORMIN HCL 500 MG PO TABS
1000.0000 mg | ORAL_TABLET | Freq: Two times a day (BID) | ORAL | Status: DC
  Administered 2020-10-07 – 2020-10-10 (×7): 1000 mg via ORAL
  Filled 2020-10-07 (×7): qty 2

## 2020-10-07 MED FILL — Mannitol IV Soln 20%: INTRAVENOUS | Qty: 500 | Status: AC

## 2020-10-07 MED FILL — Heparin Sodium (Porcine) Inj 1000 Unit/ML: INTRAMUSCULAR | Qty: 30 | Status: AC

## 2020-10-07 MED FILL — Heparin Sodium (Porcine) Inj 1000 Unit/ML: INTRAMUSCULAR | Qty: 10 | Status: AC

## 2020-10-07 MED FILL — Sodium Bicarbonate IV Soln 8.4%: INTRAVENOUS | Qty: 50 | Status: AC

## 2020-10-07 MED FILL — Sodium Chloride IV Soln 0.9%: INTRAVENOUS | Qty: 2000 | Status: AC

## 2020-10-07 MED FILL — Lidocaine HCl Local Soln Prefilled Syringe 100 MG/5ML (2%): INTRAMUSCULAR | Qty: 5 | Status: AC

## 2020-10-07 MED FILL — Magnesium Sulfate Inj 50%: INTRAMUSCULAR | Qty: 10 | Status: AC

## 2020-10-07 MED FILL — Electrolyte-R (PH 7.4) Solution: INTRAVENOUS | Qty: 4000 | Status: AC

## 2020-10-07 MED FILL — Potassium Chloride Inj 2 mEq/ML: INTRAVENOUS | Qty: 40 | Status: AC

## 2020-10-07 NOTE — Progress Notes (Signed)
Pt ambulated in hallway approximately 800 feet with front wheeled walker without difficulty.  Will continue to monitor.

## 2020-10-07 NOTE — Progress Notes (Signed)
Mobility Specialist - Progress Note   10/07/20 1415  Mobility  Activity Ambulated in hall  Level of Assistance Standby assist, set-up cues, supervision of patient - no hands on  Assistive Device Front wheel walker  Distance Ambulated (ft) 470 ft (300 ft x 1, 170 ft x 1)  Mobility Response Tolerated well  Mobility performed by Mobility specialist  $Mobility charge 1 Mobility   Pre-mobility: 79 HR During mobility: 87 HR Post-mobility: 76 HR  Pt required one standing rest break due to dyspnea, he had some audible wheezing. His SpO2 was 95% during ambulation. Pt in bed after walk.   Pricilla Handler Mobility Specialist Mobility Specialist Phone: [phone]

## 2020-10-07 NOTE — Progress Notes (Addendum)
[address]       [place] [address]             [phone]      3 Days Post-Op Procedure(s) (LRB): AORTIC VALVE REPLACEMENT (AVR) (N/A) MAZE (N/A) TRANSESOPHAGEAL ECHOCARDIOGRAM (TEE) (N/A) Subjective:  mild incisional pain , controlled with meds   Objective: Vital signs in last 24 hours: Temp:  [98.1 F (36.7 C)-98.7 F (37.1 C)] 98.7 F (37.1 C) (04/03 0714) Pulse Rate:  [68-75] 74 (04/03 0714) Cardiac Rhythm: Atrial paced (04/02 2014) Resp:  [13-23] 20 (04/03 0714) BP: (121-154)/(75-98) 149/88 (04/03 0714) SpO2:  [92 %-97 %] 94 % (04/03 0714) FiO2 (%):  [21 %] 21 % (04/02 2252) Weight:  [121.9 kg] 121.9 kg (04/03 0500)  Hemodynamic parameters for last 24 hours:    Intake/Output from previous day: 04/02 0701 - 04/03 0700 In: 332 [P.O.:250; I.V.:82] Out: 1150 [Urine:1150] Intake/Output this shift: No intake/output data recorded.  General appearance: alert, cooperative and no distress Heart: regular rate and rhythm Lungs: min dim in bases Abdomen: benign Extremities: trace edema Wound: dressing dry/intact  Lab Results: Recent Labs    10/06/20 0806 10/07/20 0137  WBC 16.8* 10.4  HGB 13.0 12.0*  HCT 38.5* 35.2*  PLT 90* 89*   BMET:  Recent Labs    10/06/20 0037 10/07/20 0137  NA 134* 133*  K 5.8* 3.7  CL 104 100  CO2 16* 26  GLUCOSE 155* 141*  BUN 18 20  CREATININE 1.39* 1.00  CALCIUM 8.4* 8.4*    PT/INR:  Recent Labs    10/04/20 1441  LABPROT 16.4*  INR 1.4*   ABG    Component Value Date/Time   PHART 7.305 (L) 10/05/2020 0030   HCO3 22.4 10/05/2020 0030   TCO2 24 10/05/2020 0030   ACIDBASEDEF 4.0 (H) 10/05/2020 0030   O2SAT 95.0 10/05/2020 0030   CBG (last 3)  Recent Labs    10/06/20 1125 10/07/20 0151 10/07/20 0648  GLUCAP 170* 144* 166*    Meds Scheduled Meds: . acetaminophen  1,000 mg Oral Q6H   Or  . acetaminophen (TYLENOL) oral liquid 160 mg/5 mL  1,000 mg Per Tube Q6H  . amLODipine  10  mg Oral Daily  . aspirin EC  81 mg Oral Daily  . atorvastatin  40 mg Oral Daily  . bisacodyl  10 mg Oral Daily   Or  . bisacodyl  10 mg Rectal Daily  . buPROPion  150 mg Oral Daily  . Chlorhexidine Gluconate Cloth  6 each Topical Daily  . citalopram  40 mg Oral q morning  . [facility]   Does not apply Once  . docusate sodium  200 mg Oral Daily  . enoxaparin (LOVENOX) injection  40 mg Subcutaneous QHS  . furosemide  40 mg Oral Daily  . insulin aspart  0-20 Units Subcutaneous TID WC  . insulin aspart  0-5 Units Subcutaneous QHS  . insulin detemir  40 Units Subcutaneous Daily  . lamoTRIgine  150 mg Oral Daily  . loratadine  10 mg Oral Daily  . mouth rinse  15 mL Mouth Rinse BID  . pantoprazole  40 mg Oral Daily  . QUEtiapine  25 mg Oral QHS  . sodium chloride flush  3 mL Intravenous Q12H   Continuous Infusions: . sodium chloride     PRN Meds:.sodium chloride, albuterol, alum & mag hydroxide-simeth, hydrALAZINE, magnesium hydroxide, ondansetron (ZOFRAN) IV, oxyCODONE, sodium  chloride flush, zolpidem  Xrays DG Chest Port 1 View  Result Date: 10/06/2020 CLINICAL DATA:  Postop day 2 aortic valve replacement. EXAM: PORTABLE CHEST 1 VIEW COMPARISON:  10/05/2020 FINDINGS: Right IJ central venous sheath in place with tip over the SVC with minimal kinking of the sheath over the soft tissues of the upper neck. Lungs are hypoinflated with suggestion of minimal left base atelectasis unchanged. Right lung is clear. Mild stable cardiomegaly. Remainder of the exam is unchanged. IMPRESSION: 1. Hypoinflation with minimal left base atelectasis unchanged. 2. Mild stable cardiomegaly. 3. Right IJ central venous sheath with tip over the SVC. Electronically Signed   By: Marin Olp M.D.   On: 10/06/2020 08:51    Assessment/Plan: S/P Procedure(s) (LRB): AORTIC VALVE REPLACEMENT (AVR) (N/A) MAZE (N/A) TRANSESOPHAGEAL ECHOCARDIOGRAM (TEE) (N/A)   1 afeb, SBP  120's -150's, will restart ACE-I 2 a paced, currently sinus in the 70's 3 sats good on RA 4 good UOP, weight slowly trending lower- cont gentle diuresis 5 normal renal fxn, minor pseudo-hyponatremia with elevated glucose 6 some elevated glucose readings, restart metformin now, farxiga and ozempic at d/c 7 leukocytosis resolved 8 H/H down a little , equalibrating 9 thrombocytopenia is stable. momitor 10 CXR, min effus/atx 11 routine pulm toilet/rehab 12 resume eliquis at d/c 13 keep pacer wires one more day    LOS: 3 days    John Giovanni PA-C Pager [phone] 10/07/2020 Patient seen and examined, agree with above  Remo Lipps C. Roxan Hockey, MD [facility] [phone]

## 2020-10-07 NOTE — Progress Notes (Signed)
Placed patient on CPAP via FFM with auto titrate pressures of  14 cmH2O- 6 cmH2O.  Pt. Tolerating at this time.

## 2020-10-08 ENCOUNTER — Other Ambulatory Visit: Payer: Self-pay

## 2020-10-08 ENCOUNTER — Inpatient Hospital Stay (HOSPITAL_COMMUNITY): Payer: 59

## 2020-10-08 LAB — GLUCOSE, CAPILLARY
Glucose-Capillary: 126 mg/dL — ABNORMAL HIGH (ref 70–99)
Glucose-Capillary: 118 mg/dL — ABNORMAL HIGH (ref 70–99)
Glucose-Capillary: 146 mg/dL — ABNORMAL HIGH (ref 70–99)
Glucose-Capillary: 143 mg/dL — ABNORMAL HIGH (ref 70–99)

## 2020-10-08 MED ORDER — METOPROLOL TARTRATE 25 MG PO TABS
25.0000 mg | ORAL_TABLET | Freq: Two times a day (BID) | ORAL | Status: DC
  Administered 2020-10-08 – 2020-10-10 (×5): 25 mg via ORAL
  Filled 2020-10-08 (×5): qty 1

## 2020-10-08 MED ORDER — KETOROLAC TROMETHAMINE 15 MG/ML IJ SOLN
15.0000 mg | Freq: Once | INTRAMUSCULAR | Status: AC
  Administered 2020-10-08: 15 mg via INTRAVENOUS

## 2020-10-08 MED ORDER — MORPHINE SULFATE (PF) 2 MG/ML IV SOLN
INTRAVENOUS | Status: AC
  Filled 2020-10-08: qty 1

## 2020-10-08 MED ORDER — MORPHINE SULFATE (PF) 2 MG/ML IV SOLN
2.0000 mg | Freq: Once | INTRAVENOUS | Status: AC
Start: 2020-10-08 — End: 2020-10-08
  Administered 2020-10-08: 2 mg via INTRAVENOUS

## 2020-10-08 MED ORDER — KETOROLAC TROMETHAMINE 15 MG/ML IJ SOLN
INTRAMUSCULAR | Status: AC
  Filled 2020-10-08: qty 1

## 2020-10-08 MED ORDER — IOHEXOL 350 MG/ML SOLN
100.0000 mL | Freq: Once | INTRAVENOUS | Status: AC | PRN
  Administered 2020-10-08: 100 mL via INTRAVENOUS

## 2020-10-08 NOTE — Significant Event (Signed)
Rapid Response Event Note   Reason for Call :  CP/SOB, SpO2-87% on RA  Initial Focused Assessment:  Pt lying in bed with eyes open, alert and oriented. Pt c/o SOB and 9/10 R sided stabbing chest pain. Pt is mildly labored and tachypneic. Lungs clear t/o, diminished in the bases. Skin warm and dry.  T-98.1, HR-79, BP-153/94, RR-30, SpO2-96% on 2L [place].   Interventions:  EKG-NSR, prolonged QT PCXR-no active disease 2mg  morphine IV x 1 15mg  toradol IV x 1 CTA chest-r/o PE Plan of Care:  Pain is getting better after meds per pt. Obtain CTA to r/o PE. Continue to monitor pt. Call RRT if further assistance needed.   Event Summary:   MD Notified: Dr. Roxan Hockey notified and came to bedside Call Buckman, Xylah Early Anderson, RN

## 2020-10-08 NOTE — Progress Notes (Signed)
      [address]       [place] [address]             [phone]      Mr. Gavia developed sudden onset of right sided CP this morning. Initially 10/10. Not tachycardic or hypotensive but was tachypneic and sats dropped to 80s. Now 95% on 2 L [place].\  On exam has pain with palpation in area of pain right lower anterior chest wall.  ECG- no ischemic changes CXR NAD  Will obtain Ct to r/o PE  Remo Lipps C. Roxan Hockey, MD [facility] [phone]

## 2020-10-08 NOTE — Progress Notes (Signed)
Epicardial pacing wires removed, per order. Wire ends intact upon removal. Sites clean and dry. Vitals stable and being monitored, per protocol. Bedrest x1 hour. Pt educated.

## 2020-10-08 NOTE — Plan of Care (Signed)

## 2020-10-08 NOTE — Progress Notes (Addendum)
   10/08/20 0034  Vitals  Temp 98.1 F (36.7 C)  Temp Source Oral  BP (!) 153/94  MAP (mmHg) 112  BP Location Right Arm  BP Method Automatic  Pulse Rate Source Monitor  ECG Heart Rate 79  Resp (!) 29  Level of Consciousness  Level of Consciousness Alert  MEWS COLOR  MEWS Score Color Yellow  Oxygen Therapy  SpO2 96 %  O2 Device Nasal Cannula  O2 Flow Rate (L/min) 2 L/min  Pain Assessment  Pain Scale 0-10  Pain Score 10  Pain Type Acute pain  Pain Location Chest  Pain Orientation Right  Pain Descriptors / Indicators Stabbing  Pain Frequency Constant  Pain Onset On-going  Pain Intervention(s) Medication (See eMAR)  PCA/Epidural/Spinal Assessment  Respiratory Pattern Regular;Tachypnea  Glasgow Coma Scale  Eye Opening 4  Best Verbal Response (NON-intubated) 5  Best Motor Response 6  Glasgow Coma Scale Score 15  MEWS Score  MEWS Temp 0  MEWS Systolic 0  MEWS Pulse 0  MEWS RR 2  MEWS LOC 0  MEWS Score 2  Patient called to the nurses station that he was having SOB , upon arrival to the patient's room, patient had taken off his CPAP, he c/o of SOB, right sided stabbing,shooting chest pain 10/10.He stated that his pain started after he walked in the hall and that he pain has gotten progressively worse. RT notified, RRT notified, EKG showed NSR prolonged QT, chest xray ordered by RRT, tylenol 1000 mg given at [place] MD notified. Cardiology notified as requested by Roxan Hockey MD. Cardiology reccommended CTA to rule out PE. Roxan Hockey MD called to notify him of cardiologist's recommendation, Roxan Hockey MD coming to bedside to assess patient. Will continue to monitor.  Patient stated that his pain is now 5/10.

## 2020-10-08 NOTE — Progress Notes (Signed)
Mobility Specialist - Progress Note   10/08/20 1459  Mobility  Activity Ambulated in hall  Level of Assistance Standby assist, set-up cues, supervision of patient - no hands on  Assistive Device Front wheel walker  Distance Ambulated (ft) 470 ft  Mobility Response Tolerated well  Mobility performed by Mobility specialist  $Mobility charge 1 Mobility   Pre-mobility: 76 HR, 132/70 BP Post-mobility: 79 HR, 137/69 BP  Pt required 2 standing rest breaks due to sharp, non-radiating, R sided chest pain. He says the pain was not as bad as this am. Pt to bed after walk to get wires pulled.   Pricilla Handler Mobility Specialist Mobility Specialist Phone: [phone]

## 2020-10-08 NOTE — Progress Notes (Signed)
Pt states he will place himself on/off cpap as needed.  Aware to call in needs assistance.

## 2020-10-08 NOTE — Discharge Summary (Signed)
Physician Discharge Summary  Patient ID: Dalton [name] MRN: [id number] DOB/AGE: [birth year] 55 y.o.  Admit date: 10/04/2020 Discharge date: 10/10/2020  Admission Diagnoses: Moderate aortic stenosis Moderate aortic insufficiency Bicuspid aortic valve Persistent long-standing atrial fibrillation Chronic combined systolic and diastolic heart failure (HCC) History of post-traumatic stress disorder History of obstructive sleep apnea Type 2 diabetes mellitus Hypertension  Discharge Diagnoses:   Moderate aortic stenosis Moderate aortic insufficiency Bicuspid aortic valve Persistent long-standing atrial fibrillation Chronic combined systolic and diastolic heart failure (HCC) History of post-traumatic stress disorder Type 2 diabetes mellitus Hypertension History of obstructive sleep apnea Expected acute blood loss anemia S/P aortic valve replacement with bilateral MAZE procedure and application os a left atrial clip   Discharged Condition: stable  History of Present Illness:  The patient is a 55 year old gentleman with a history of hypertension, hyperlipidemia, type 2 DM, OSA on CPAP, history of SVT status post ablation in 2013, and bicuspid aortic valve disease with aortic stenosis.  An echocardiogram in September 2020 showed a bicuspid aortic valve with mild to moderate aortic insufficiency and a pressure half-time of 484 ms.  The mean gradient across aortic valve is 12.4 with a peak gradient of 21 and a valve area of 1.9 cm consistent with moderate aortic stenosis.  Left ventricular ejection fraction was 45 to 50%.  LV internal dimension during diastole was 6.2 cm and during systole 5.0 cm.  His most recent 2D echocardiogram on 09/23/2019 showed a mean gradient across aortic valve of 16 mmHg with a peak gradient of 27 mmHg.  Dimensionless index was 0.27 consistent with moderate aortic stenosis.  Aortic insufficiency appears mild with a pressure half-time of 512 ms.  Left ventricular  ejection fraction was 40 to 45%.  LV internal dimensions were 5.6 cm in diastole and 4.3 cm in systole.  On 11/16/2019 he underwent TEE which showed a bicuspid aortic valve with fusion of the non and right coronary cusp with a single raphae.  There was severely restricted movement of the conjoined leaflets.  The left coronary cusp was moving without restriction.  The mean gradient was 19 mmHg with an aortic valve area of 1.3 cm and dimensionless index of 0.29.  Aortic valve area by planimetry was 1.48 to 1.68 cm.  There was felt to be moderate aortic insufficiency.  Left ventricular ejection fraction was 45 to 50%.  He underwent cardiac catheterization on the same day which showed a mean gradient across aortic valve of 40 mmHg with a calculated valve area of 1.69 cm.  There was mild pulmonary hypertension at 34/18 with a mean wedge pressure of 19.  LVEDP was 21 mmHg.  There was no significant coronary disease. He had a follow-up echo on 09/06/2020 which I have reviewed.  It is a poor quality study but essentially the same as his prior echo.  This shows a bicuspid aortic valve with heavily calcified leaflets with restricted mobility.  The mean gradient measured across the valve was 18.2 mmHg.  There is moderate aortic insufficiency.  Left ventricular ejection fraction still looks in the 45 to 50% range to me although it was read as 60 to 65% in the echo report.  CTA of the chest done 02/02/2020 had shown the ascending aorta to be mildly enlarged at 3.6 cm with an aortic root diameter of 3.3 cm.  The proximal descending thoracic aorta was 3 cm.   He continues to work full-time for [employer] and has a fairly strenuous job.  He does get some  shortness of breath with moderate exertion and gets fatigued but is able to stop and rest and continue on.  He denies any chest pain or pressure.  He has had no orthopnea.  He denies peripheral edema.  He has had chronic atrial fibrillation for years and is on Eliquis.  He was  evaluated by Dr. Cyndia Bent who felt the patient would benefit from Aortic Valve Replacement and MAZE procedure.  The risks and benefits of the procedure were explained to the patient and he was agreeable to proceed.  Hospital Course:   Mr. Burkett presented to [facility] on 10/04/2020.  He was taken to the operating room and underwent Aortic Valve Replacement with a 25 mm Edwards Resilia Bioprosthetic valve.   He underwent MAZE procedure with Radiofrequency ablation and cryothermy of a Bi-Atrial lesion set.  He also had a clip applied to his left atrial appendage with a 45 mm Atricure Pro Clip 2.  He tolerated the procedure without difficulty and was taken to the SICU in stable condition.   He was initiated on Amiodarone post operatively.   Postoperative Hospital Course:  The patient was weaned from the ventilator using standard post cardiac surgical protocols without difficulty.  He is remained neurologically intact.  He initially did require a little Neo-Synephrine and dopamine for blood pressure support but this was weaned without difficulty.  Swan-Ganz was removed on postop day 1.  Also on postop day #1 he was noted to be in a sinus bradycardia requiring atrial pacing.  Beta-blocker was not initiated.  Ultimately it did require discontinuing amiodarone as well.  On postoperative day 2 he was noted to be back in sinus rhythm with a rate in the 70s.  He also showed some hypertensive readings and was restarted back on his preoperative ACE inhibitor.  He is noted to have a mild expected acute blood loss anemia which is stabilizing.  He has required some gentle diuresis for volume overload.  Renal function has remained within normal limits. IN the ealy morning of POD4, he had n episode of right-sided chest pain.  He was evaluated at the bedside by the rapid response team and also by Dr. Roxan Hockey. EKG, CXR, and CTA of the chest were all negative for acute process. His pain was controlled with morphine  and Toradol and he had no further episodes of this pain.  He continued to have hypertension after the amlodipine and lisinopril were resumed so, after he was observed to have stable SR for approximately 48 hours, he was started back on his metoprolol. Pacer wires were removed on post-op day 4.  He remained in a stable sinus rhythm with heart rates of around 60 to 70/min.  Eliquis was resumed at discharge. At the time of discharge, he was ambulating independently and maintaining adequate oxygen saturation on room air.  His incisions were intact and healing with no sign of complication.  The chest tube site sutures remain in place and will be removed in the office.   Consults: cardiology  Significant Diagnostic Studies:  ECHOCARDIOGRAM REPORT       Patient Name:  KAINALU HEGGS Kernes Date of Exam: 09/23/2019  Medical Rec #: [id number]     Height:    73.0 in  Accession #:  [id number]    Weight:    272.8 lb  Date of Birth: 12/01/1965     BSA:     2.455 m  Patient Age:  31 years     BP:  128/77 mmHg  Patient Gender: M         HR:      84 bpm.  Exam Location: [address]   Procedure: 2D Echo, Cardiac Doppler, Color Doppler and Intracardiac       Opacification Agent   Indications:  I35.0    History:    Patient has prior history of Echocardiogram examinations,  most         recent 03/25/2019. LVH, AS. Bicuspid aortic valve.,         Arrythmias:Atrial Fibrillation; Risk Factors:Former  Smoker,         Hypertension, Diabetes, Dyslipidemia and Obesity.    Sonographer:  Coralyn Helling RDCS  Referring Phys: [place]     Sonographer Comments: Patient is morbidly obese. Image acquisition  challenging due to patient body habitus.  IMPRESSIONS    1. Compared to echo from Sept 2020, there is no significant change.  2. Poor acoustic windows limit study Consider Definity to further  define.  3. Left ventricular ejection fraction, by estimation, is 40 to 45%. The  left ventricle has mildly decreased function. The left ventricle  demonstrates global hypokinesis. There is mild left ventricular  hypertrophy. Left ventricular diastolic parameters  are indeterminate.  4. Right ventricular systolic function is mildly reduced. The right  ventricular size is normal.  5. The mitral valve is abnormal. Trivial mitral valve regurgitation.  6. AV is difficult to see. Peak and mean gradients through the valve are  27 and 16 mm Hg respectively LVO/AV VTI ratio is 0.27 consistent with  moderate AS. Consider TEE to further define. .. Aortic valve regurgitation  is mild. no significant change from  prevoius echo  7. The inferior vena cava is normal in size with greater than 50%  respiratory variability, suggesting right atrial pressure of 3 mmHg.   FINDINGS  Left Ventricle: Left ventricular ejection fraction, by estimation, is 40  to 45%. The left ventricle has mildly decreased function. The left  ventricle demonstrates global hypokinesis. Definity contrast agent was  given IV to delineate the left ventricular  endocardial borders. The left ventricular internal cavity size was small.  There is mild left ventricular hypertrophy. Left ventricular diastolic  parameters are indeterminate.   Right Ventricle: The right ventricular size is normal. No increase in  right ventricular wall thickness. Right ventricular systolic function is  mildly reduced.   Left Atrium: Left atrial size was normal in size.   Right Atrium: Right atrial size was normal in size.   Pericardium: There is no evidence of pericardial effusion.   Mitral Valve: The mitral valve is abnormal. There is mild thickening of  the mitral valve leaflet(s). Trivial mitral valve regurgitation.   Tricuspid Valve: The tricuspid valve is normal in structure. Tricuspid  valve regurgitation is trivial.   Aortic  Valve: AV is difficult to see. Peak and mean gradients through the  valve are 27 and 16 mm Hg respectively LVO/AV VTI ratio is 0.27 consistent  with moderate AS. Consider TEE to further define. The aortic valve was not  well visualized. Aortic valve  regurgitation is mild. Aortic regurgitation PHT measures 512 msec. Aortic  valve mean gradient measures 14.8 mmHg. Aortic valve peak gradient  measures 24.7 mmHg.   Pulmonic Valve: The pulmonic valve was not well visualized. Pulmonic valve  regurgitation is not visualized.   Aorta: The aortic root is normal in size and structure.   Venous: The inferior vena cava is normal in size with  greater than 50%  respiratory variability, suggesting right atrial pressure of 3 mmHg.   IAS/Shunts: No atrial level shunt detected by color flow Doppler.     LEFT VENTRICLE  PLAX 2D  LVIDd:     5.60 cm  LVIDs:     4.30 cm  LV PW:     1.30 cm  LV IVS:    1.30 cm     IVC  IVC diam: 1.30 cm   LEFT ATRIUM      Index    RIGHT ATRIUM      Index  LA diam:   4.00 cm 1.63 cm/m RA Pressure: 3.00 mmHg  LA Vol (A4C): 72.7 ml 29.61 ml/m RA Area:   28.60 cm                  RA Volume:  104.00 ml 42.36 ml/m  AORTIC VALVE  AV Vmax:      248.60 cm/s  AV Vmean:     178.200 cm/s  AV VTI:      0.482 m  AV Peak Grad:   24.7 mmHg  AV Mean Grad:   14.8 mmHg  LVOT Vmax:     81.96 cm/s  LVOT Vmean:    52.720 cm/s  LVOT VTI:     0.130 m  LVOT/AV VTI ratio: 0.27  AI PHT:      512 msec    AORTA  Ao Root diam: 3.60 cm  Ao Asc diam: 3.90 cm   MV E velocity: 86.98 cm/s TRICUSPID VALVE               Estimated RAP: 3.00 mmHg                 SHUNTS               Systemic VTI: 0.13 m   Dorris Carnes MD  Electronically signed by Dorris Carnes MD  Signature Date/Time: 09/24/2019/10:59:08 PM      Treatments:  CARDIOVASCULAR SURGERY OPERATIVE NOTE  10/04/2020 [name] [id number]  Surgeon:  Gaye Pollack, MD  First Assistant: Ellwood Handler,  PA-C   Preoperative Diagnosis:  Bicuspid aortic valve with moderate aortic stenosis and moderate aortic insufficiency, long-standing persistent atrial fibrillation.   Postoperative Diagnosis:  Same   Procedure:  1. Median Sternotomy 2. Extracorporeal circulation 3.   Aortic valve replacement using a 25 mm Edwards INSPIRIS RESILIA pericardial valve. 4.   Bi-atrial MAZE procedure using radiofrequency and cryothermy.  5.   Clipping of left atrial appendage.  Discharge Exam: Blood pressure 133/83, pulse 65, temperature 98.2 F (36.8 C), temperature source Oral, resp. rate (!) 22, height 6\' 1"  (1.854 m), weight 124.2 kg, SpO2 95 %.  General appearance: alert, cooperative and no distress Neurologic: intact Heart: SR with rates ~60-70/min. Lungs: clear to auscultation bilaterally Abdomen: Soft and non-tender.  Extremities: all well perfused with 1+ pretibial edema Wound: The sternotomy incision is well approximated and dry.   Disposition: Discharged home in stable condition.  A rolling walker is provided at discharge.  No other home health needs identified.  Discharge Instructions    Amb Referral to Cardiac Rehabilitation   Complete by: As directed    Diagnosis: Valve Replacement   Valve: Aortic   After initial evaluation and assessments completed: Virtual Based Care may be provided alone or in conjunction with Phase 2 Cardiac Rehab based on patient barriers.: Yes     Allergies as of 10/10/2020   No Known Allergies  Medication List    STOP taking these medications   cephALEXin 500 MG capsule Commonly known as: Keflex   mupirocin ointment 2 % Commonly known as: BACTROBAN     TAKE these medications   acetaminophen 500 MG tablet Commonly known as: TYLENOL Take 1,000 mg by mouth every 6  (six) hours as needed for moderate pain or headache.   amLODipine 10 MG tablet Commonly known as: NORVASC Take 1 tablet (10 mg total) by mouth daily. Start taking on: October 11, 2020   apixaban 5 MG Tabs tablet Commonly known as: Eliquis Take 1 tablet (5 mg total) by mouth 2 (two) times daily.   aspirin 81 MG EC tablet Take 1 tablet (81 mg total) by mouth daily. Swallow whole. Start taking on: October 11, 2020   atorvastatin 40 MG tablet Commonly known as: LIPITOR Take 1 tablet (40 mg total) by mouth daily.   buPROPion 150 MG 24 hr tablet Commonly known as: WELLBUTRIN XL Take 1 tablet (150 mg total) by mouth daily.   cetirizine 10 MG tablet Commonly known as: ZYRTEC Take 10 mg by mouth 2 (two) times daily.   citalopram 40 MG tablet Commonly known as: CELEXA Take 40 mg by mouth every morning.   Farxiga 5 MG Tabs tablet Generic drug: dapagliflozin propanediol TAKE 1 TABLET(5 MG) BY MOUTH DAILY BEFORE AND BREAKFAST What changed: See the new instructions.   fluticasone 50 MCG/ACT nasal spray Commonly known as: FLONASE Place 2 sprays into both nostrils daily as needed for allergies or rhinitis.   FreeStyle Libre 2 Reader Kerrin Mo Use to check glucose at least TID   FreeStyle Libre 2 Sensor Misc Use to check glucose at least Q8H   glucose blood test strip Use as instructed to check sugars BID. Dx e11.9   lamoTRIgine 150 MG tablet Commonly known as: LAMICTAL Take 1 tablet (150 mg total) by mouth daily.   lisinopril 20 MG tablet Commonly known as: ZESTRIL Take 20 mg by mouth daily.   metFORMIN 1000 MG tablet Commonly known as: GLUCOPHAGE TAKE 1 TABLET BY MOUTH TWICE DAILY   metoprolol succinate 50 MG 24 hr tablet Commonly known as: TOPROL-XL Take 1 tablet (50 mg total) by mouth daily.   NON FORMULARY as directed. CPAP   OneTouch Delica Plus [id number] Misc USE TWICE DAILY AS DIRECTED   oxyCODONE 5 MG immediate release tablet Commonly known as: Oxy  IR/ROXICODONE Take 1 tablet (5 mg total) by mouth every 4 (four) hours as needed for up to 5 days for severe pain.   Ozempic (0.25 or 0.5 MG/DOSE) 2 MG/1.5ML Sopn Generic drug: Semaglutide(0.25 or 0.5MG /DOS) Inject 0.25 mg weekly for 4 weeks then increase to 0.5 mg weekly What changed:   how much to take  how to take this  when to take this  additional instructions   QUEtiapine 25 MG tablet Commonly known as: SEROQUEL Take 1 tablet (25 mg total) by mouth at bedtime.   spironolactone 25 MG tablet Commonly known as: ALDACTONE Take 0.5 tablets (12.5 mg total) by mouth daily.            Durable Medical Equipment  (From admission, onward)         Start     Ordered   10/09/20 1106  For home use only DME Walker rolling  Once       Question Answer Comment  Walker: With 5 Inch Wheels   Patient needs a walker to treat with the following condition Weakness  10/09/20 1105          Follow-up Information    Gaye Pollack, MD. Go on 10/31/2020.   Specialty: Cardiothoracic Surgery Why: Your appointment is at 10am.  Please arrive 30 minutes early for a CXR to be performed by [facility] located on the first floor of the same building.  Contact information: [address] [place] [address] [phone]        [facility]-Cardiac  Follow up.   Specialty: Cardiothoracic Surgery Why: Appointment is at 11am  with nurse for suture removal Contact information: [place], [place]       [name], MD. Go on 10/29/2020.   Specialty: Cardiology Why: Your appointment is at 10:20am. Contact information: [address] [address] [phone]        [facility] ECHO LAB. Go on 11/12/2020.   Specialty: Cardiology Why: Your appointment for follow up ECHOCARDIOGRAM is at 10:20am Contact information: [address] [phone] [place] [address] [place], [facility] Follow up.   Why: Rolling walker to be delivered to room prior to d/c. Contact information: [place] [address] [phone]               Signed: Antony Odea, PA-C 10/10/2020, 9:45 AM

## 2020-10-08 NOTE — Progress Notes (Addendum)
      [address]       [place] [address]             [phone]      4 Days Post-Op Procedure(s) (LRB): AORTIC VALVE REPLACEMENT (AVR) (N/A) MAZE (N/A) TRANSESOPHAGEAL ECHOCARDIOGRAM (TEE) (N/A) Subjective: Events of last night noted. Mr. Rumbold is resting in bed, says he feels much better and has had no further episodes since his pain was controlled with analgesics early this AM.   Doing well with mobility. No BM yet but does not feel uncomfortable.   Objective: Vital signs in last 24 hours: Temp:  [97.8 F (36.6 C)-98.7 F (37.1 C)] 97.9 F (36.6 C) (04/04 0736) Pulse Rate:  [68-79] 68 (04/04 0736) Cardiac Rhythm: Normal sinus rhythm (04/03 1935) Resp:  [15-29] 18 (04/04 0736) BP: (122-153)/(71-94) 134/81 (04/04 0736) SpO2:  [92 %-99 %] 95 % (04/04 0736) Weight:  [124.2 kg] 124.2 kg (04/04 0413)     Intake/Output from previous day: 04/03 0701 - 04/04 0700 In: -  Out: 450 [Urine:450] Intake/Output this shift: No intake/output data recorded.  General appearance: alert, cooperative and no distress Neurologic: intact Heart: NSR, no sign opf pacing since transfer to 4E yesterday. Pacer attached in AAI mode at 45. Lungs: clear to auscultation bilaterally Abdomen: Soft and non-tender.  Extremities: all well perfused with minimal edema Wound: The sternotomy incision is well approximated and dry.  Lab Results: Recent Labs    10/06/20 0806 10/07/20 0137  WBC 16.8* 10.4  HGB 13.0 12.0*  HCT 38.5* 35.2*  PLT 90* 89*   BMET:  Recent Labs    10/06/20 0037 10/07/20 0137  NA 134* 133*  K 5.8* 3.7  CL 104 100  CO2 16* 26  GLUCOSE 155* 141*  BUN 18 20  CREATININE 1.39* 1.00  CALCIUM 8.4* 8.4*    PT/INR: No results for input(s): LABPROT, INR in the last 72 hours. ABG    Component Value Date/Time   PHART 7.305 (L) 10/05/2020 0030   HCO3 22.4 10/05/2020 0030   TCO2 24 10/05/2020 0030   ACIDBASEDEF 4.0 (H) 10/05/2020 0030   O2SAT 95.0  10/05/2020 0030   CBG (last 3)  Recent Labs    10/07/20 1635 10/07/20 2132 10/08/20 0603  GLUCAP 134* 158* 126*    Assessment/Plan: S/P Procedure(s) (LRB): AORTIC VALVE REPLACEMENT (AVR) (N/A) MAZE (N/A) TRANSESOPHAGEAL ECHOCARDIOGRAM (TEE) (N/A)  -POD4 aortic valve replacement and MAZE for moderate AI and AS in a bicuspid aortic valve and persistent atrial fibrillation.  Had severe right-side chest and shoulder pain last night with evaluation by the Rapid Response team and Dr. Roxan Hockey. CXR, EKG, and CT chest all negative for acute processes. Pain resolved with morphine, Toradol and he has been reasonably comfortable since.  Continue rehab with PT, IS.   -History of atrial fibrillation- in stable SR, D/C pacer but leave wires another day.   -Hypertension- MAP 100-120, on amlodipine and lisinopril. Resume his metoprolol since he has return of stable SR.   -DVT PPX- Continue ambulation and enoxaparin.   -Type 2 DM- control resonable. Continue SSI and metformin.    LOS: 4 days    Antony Odea, [place] [phone] 10/08/2020 Patient seen and examined, agree with above No further R CP. CT was unremarkable In SR will dc pacing wires. [place]. Roxan Hockey, MD [facility] [phone]

## 2020-10-08 NOTE — Progress Notes (Signed)
CARDIAC REHAB PHASE I   PRE:  Rate/Rhythm: 70 SR  BP:  Sitting: 111/71      SaO2: 95 RA  MODE:  Ambulation: 470 ft   POST:  Rate/Rhythm: 82 SR  BP:  Sitting: 126/69    SaO2: 94 RA   Pt ambulated 414ft in hallway standby assist with front wheel walker. ~373ft into walk, pt took a standing rest break c/o 4/10 R sided CP and SOB similar to episode during the night. Coached through purse lipped breathing. Pt able to finish walk, pain consistent through remainder of walk. RN made aware. Pt returned to recliner. Able to demonstrate 750 on IS. Encouraged continued ambulation and IS use. Pt checking with wife for DME needs. Will continue to follow.  [phone] Rufina Falco, RN BSN 10/08/2020 10:04 AM

## 2020-10-08 NOTE — Progress Notes (Signed)
Epicardial pacing wires removed from pacer box. HR stable NSR in 70s.

## 2020-10-09 ENCOUNTER — Ambulatory Visit: Payer: 59 | Admitting: Licensed Clinical Social Worker

## 2020-10-09 ENCOUNTER — Other Ambulatory Visit: Payer: Self-pay | Admitting: Internal Medicine

## 2020-10-09 DIAGNOSIS — E1165 Type 2 diabetes mellitus with hyperglycemia: Secondary | ICD-10-CM

## 2020-10-09 LAB — GLUCOSE, CAPILLARY
Glucose-Capillary: 102 mg/dL — ABNORMAL HIGH (ref 70–99)
Glucose-Capillary: 109 mg/dL — ABNORMAL HIGH (ref 70–99)
Glucose-Capillary: 132 mg/dL — ABNORMAL HIGH (ref 70–99)
Glucose-Capillary: 137 mg/dL — ABNORMAL HIGH (ref 70–99)

## 2020-10-09 MED ORDER — LACTULOSE 10 GM/15ML PO SOLN
20.0000 g | Freq: Once | ORAL | Status: AC
  Administered 2020-10-09: 20 g via ORAL
  Filled 2020-10-09: qty 30

## 2020-10-09 MED ORDER — MAGNESIUM CITRATE PO SOLN: 0.5000 | Freq: Once | ORAL | Status: DC | PRN

## 2020-10-09 NOTE — Progress Notes (Signed)
CARDIAC REHAB PHASE I   PRE:  Rate/Rhythm: 54 SR  BP:  Sitting: 137/78      SaO2: 97 RA  MODE:  Ambulation: 470 ft   POST:  Rate/Rhythm: 72 SR  BP:  Sitting: in BR    SaO2: 98 RA   Pt ambulated 472ft in hallway independently with front wheel walker. Pt denies CP or dizziness, some SOB noted, pt states it is from the mask. Pt returned to BR. Encouraged continued ambulation and IS use. Requesting walker for home use. RN and CM made aware. Will continue to follow.  [phone] Rufina Falco, RN BSN 10/09/2020 11:00 AM

## 2020-10-09 NOTE — Progress Notes (Signed)
      [address]       [place] [address]             [phone]      5 Days Post-Op Procedure(s) (LRB): AORTIC VALVE REPLACEMENT (AVR) (N/A) MAZE (N/A) TRANSESOPHAGEAL ECHOCARDIOGRAM (TEE) (N/A) Subjective: No new problems, pain control is better.  Doing well with mobility. No BM yet but does not feel uncomfortable.   Objective: Vital signs in last 24 hours: Temp:  [98.1 F (36.7 C)-98.8 F (37.1 C)] 98.1 F (36.7 C) (04/05 0755) Pulse Rate:  [61-74] 66 (04/05 0755) Cardiac Rhythm: Normal sinus rhythm (04/04 2031) Resp:  [16-24] 17 (04/05 0755) BP: (119-144)/(69-86) 144/82 (04/05 0755) SpO2:  [92 %-100 %] 92 % (04/05 0755)     Intake/Output from previous day: 04/04 0701 - 04/05 0700 In: 3 [I.V.:3] Out: 200 [Urine:200] Intake/Output this shift: No intake/output data recorded.  General appearance: alert, cooperative and no distress Neurologic: intact Heart: SR with rates 50's to 60's, pacer wires are out.  Lungs: clear to auscultation bilaterally Abdomen: Soft and non-tender.  Extremities: all well perfused with minimal edema Wound: The sternotomy incision is well approximated and dry.  Lab Results: Recent Labs    10/07/20 0137  WBC 10.4  HGB 12.0*  HCT 35.2*  PLT 89*   BMET:  Recent Labs    10/07/20 0137  NA 133*  K 3.7  CL 100  CO2 26  GLUCOSE 141*  BUN 20  CREATININE 1.00  CALCIUM 8.4*    PT/INR: No results for input(s): LABPROT, INR in the last 72 hours. ABG    Component Value Date/Time   PHART 7.305 (L) 10/05/2020 0030   HCO3 22.4 10/05/2020 0030   TCO2 24 10/05/2020 0030   ACIDBASEDEF 4.0 (H) 10/05/2020 0030   O2SAT 95.0 10/05/2020 0030   CBG (last 3)  Recent Labs    10/08/20 1547 10/08/20 2125 10/09/20 0622  GLUCAP 146* 143* 102*    Assessment/Plan: S/P Procedure(s) (LRB): AORTIC VALVE REPLACEMENT (AVR) (N/A) MAZE (N/A) TRANSESOPHAGEAL ECHOCARDIOGRAM (TEE) (N/A)  -POD5 aortic valve replacement and  MAZE for moderate AI and AS in a bicuspid aortic valve and persistent atrial fibrillation.  Pain control better.   Continue rehab with PT, IS.   -History of atrial fibrillation- s/p MAZE, in SR with rates 50-60's.  Will plan to resume Eliquis at discharge  -Hypertension- control better on amlodipine, lisinopril, and metoprolol.  Will ask cardiology to review regimen since he tends to be bradycardic.   -DVT PPX- Continue ambulation and enoxaparin.   -Type 2 DM- control resonable. Continue SSI and metformin.   -No BM since surgery-lactulose, this am, Mg Citrate if needed.   -Disposition-anticipate discharge tomorrow.     LOS: 5 days    Antony Odea, PA-C [phone]

## 2020-10-09 NOTE — Progress Notes (Signed)
Mobility Specialist: Progress Note   10/09/20 1335  Mobility  Activity Ambulated in hall  Level of Assistance Modified independent, requires aide device or extra time  Assistive Device Front wheel walker  Distance Ambulated (ft) 470 ft  Mobility Response Tolerated fair  Mobility performed by Mobility specialist  $Mobility charge 1 Mobility   Pre-Mobility: 56 HR During Mobility: 60 HR, 95% SpO2 Post-Mobility: 60 HR  Pt stopped for two standing breaks due to feeling SOB, sats maintained mid 90s during walk. Pt also c/o pain in his R shoulder during ambulation, no rating given. Pt to BR after walk per request and was instructed to pull call string on wall when he is done.   [facility] Alvia Tory Mobility Specialist Mobility Specialist Phone: [phone]

## 2020-10-09 NOTE — Progress Notes (Signed)
Patient wearing CPAP. Tolerating well.

## 2020-10-09 NOTE — Progress Notes (Signed)
Pt. States he can place cpap on himself when ready.

## 2020-10-10 DIAGNOSIS — I4819 Other persistent atrial fibrillation: Secondary | ICD-10-CM

## 2020-10-10 DIAGNOSIS — Z952 Presence of prosthetic heart valve: Secondary | ICD-10-CM | POA: Diagnosis not present

## 2020-10-10 LAB — GLUCOSE, CAPILLARY: Glucose-Capillary: 122 mg/dL — ABNORMAL HIGH (ref 70–99)

## 2020-10-10 MED ORDER — ASPIRIN 81 MG PO TBEC: 81.0000 mg | DELAYED_RELEASE_TABLET | Freq: Every day | ORAL | 11 refills | Status: DC

## 2020-10-10 MED ORDER — OXYCODONE HCL 5 MG PO TABS: 5.0000 mg | ORAL_TABLET | ORAL | 0 refills | Status: AC | PRN

## 2020-10-10 MED ORDER — AMLODIPINE BESYLATE 10 MG PO TABS: 10.0000 mg | ORAL_TABLET | Freq: Every day | ORAL | 2 refills | Status: DC

## 2020-10-10 NOTE — Progress Notes (Signed)
Cardiology Progress Note  Patient ID: Dalton [name] MRN: [id number] DOB: 21-Apr-1966 Date of Encounter: 10/10/2020  Primary Cardiologist: Sinclair Grooms, MD  Subjective   Chief Complaint: None.  HPI: In sinus rhythm.  Discharge pending today.  ROS:  All other ROS reviewed and negative. Pertinent positives noted in the HPI.     Inpatient Medications  Scheduled Meds: . amLODipine  10 mg Oral Daily  . aspirin EC  81 mg Oral Daily  . atorvastatin  40 mg Oral Daily  . bisacodyl  10 mg Oral Daily   Or  . bisacodyl  10 mg Rectal Daily  . buPROPion  150 mg Oral Daily  . Chlorhexidine Gluconate Cloth  6 each Topical Daily  . citalopram  40 mg Oral q morning  . [place] [facility] Patient [facility] Family Education   Does not apply Once  . docusate sodium  200 mg Oral Daily  . enoxaparin (LOVENOX) injection  40 mg Subcutaneous QHS  . furosemide  40 mg Oral Daily  . insulin aspart  0-20 Units Subcutaneous TID WC  . insulin aspart  0-5 Units Subcutaneous QHS  . insulin detemir  40 Units Subcutaneous Daily  . lamoTRIgine  150 mg Oral Daily  . lisinopril  20 mg Oral Daily  . loratadine  10 mg Oral Daily  . mouth rinse  15 mL Mouth Rinse BID  . metFORMIN  1,000 mg Oral BID WC  . metoprolol tartrate  25 mg Oral BID  . pantoprazole  40 mg Oral Daily  . QUEtiapine  25 mg Oral QHS  . sodium chloride flush  3 mL Intravenous Q12H   Continuous Infusions: . sodium chloride     PRN Meds: sodium chloride, albuterol, alum & mag hydroxide-simeth, hydrALAZINE, magnesium citrate, magnesium hydroxide, ondansetron (ZOFRAN) IV, oxyCODONE, sodium chloride flush, zolpidem   Vital Signs   Vitals:   10/09/20 2020 10/09/20 2354 10/10/20 0315 10/10/20 0819  BP: (!) 149/81 134/74 (!) 145/86 133/83  Pulse: 96 93 100 65  Resp: 19 18 16  (!) 22  Temp: 97.7 F (36.5 C) 97.7 F (36.5 C) 97.7 F (36.5 C) 98.2 F (36.8 C)  TempSrc: Oral Oral Oral Oral  SpO2: 98% 96% 97% 95%  Weight:       Height:       No intake or output data in the 24 hours ending 10/10/20 0907 Last 3 Weights 10/08/2020 10/07/2020 10/06/2020  Weight (lbs) 273 lb 13 oz 268 lb 12.8 oz 270 lb 11.6 oz  Weight (kg) 124.2 kg 121.927 kg 122.8 kg     ECG  The most recent ECG shows sinus rhythm heart rate 79, prolonged QT interval, which I personally reviewed.   Physical Exam   Vitals:   10/09/20 2020 10/09/20 2354 10/10/20 0315 10/10/20 0819  BP: (!) 149/81 134/74 (!) 145/86 133/83  Pulse: 96 93 100 65  Resp: 19 18 16  (!) 22  Temp: 97.7 F (36.5 C) 97.7 F (36.5 C) 97.7 F (36.5 C) 98.2 F (36.8 C)  TempSrc: Oral Oral Oral Oral  SpO2: 98% 96% 97% 95%  Weight:      Height:       No intake or output data in the 24 hours ending 10/10/20 0907  Last 3 Weights 10/08/2020 10/07/2020 10/06/2020  Weight (lbs) 273 lb 13 oz 268 lb 12.8 oz 270 lb 11.6 oz  Weight (kg) 124.2 kg 121.927 kg 122.8 kg    Body mass index is 36.13 kg/m.  General: Well nourished, well developed, in no acute distress Head: Atraumatic, normal size  Eyes: PEERLA, EOMI  Neck: Supple, no JVD Endocrine: No thryomegaly Cardiac: Normal S1, S2; RRR; 2 out of 6 systolic ejection murmur Lungs: Clear to auscultation bilaterally, no wheezing, rhonchi or rales  Abd: Soft, nontender, no hepatomegaly  Ext: No edema, pulses 2+ Musculoskeletal: No deformities, BUE and BLE strength normal and equal Skin: Warm and dry, no rashes   Neuro: Alert and oriented to person, place, time, and situation, CNII-XII grossly intact, no focal deficits  Psych: Normal mood and affect   Labs  High Sensitivity Troponin:  No results for input(s): TROPONINIHS in the last 720 hours.   Cardiac EnzymesNo results for input(s): TROPONINI in the last 168 hours. No results for input(s): TROPIPOC in the last 168 hours.  Chemistry Recent Labs  Lab 10/05/20 1747 10/06/20 0037 10/07/20 0137  NA 134* 134* 133*  K 5.1 5.8* 3.7  CL 103 104 100  CO2 21* 16* 26  GLUCOSE 171* 155*  141*  BUN 16 18 20   CREATININE 1.41* 1.39* 1.00  CALCIUM 8.3* 8.4* 8.4*  GFRNONAA 59* 60* >60  ANIONGAP 10 14 7     Hematology Recent Labs  Lab 10/05/20 1747 10/06/20 0806 10/07/20 0137  WBC 20.5* 16.8* 10.4  RBC 4.33 4.04* 3.73*  HGB 14.0 13.0 12.0*  HCT 42.3 38.5* 35.2*  MCV 97.7 95.3 94.4  MCH 32.3 32.2 32.2  MCHC 33.1 33.8 34.1  RDW 14.6 14.6 14.5  PLT 97* 90* 89*   BNPNo results for input(s): BNP, PROBNP in the last 168 hours.  DDimer No results for input(s): DDIMER in the last 168 hours.   Radiology  No results found.  Patient Profile  MORDECAI TINDOL is a 55 y.o. male with bicuspid aortic valve with mixed moderate AAS and moderate AI, A. fib, hypertension who was admitted on 10/04/2020 for aortic valve replacement with surgical Maze procedure and left atrial appendage clipping.  Assessment & Plan   1.  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 -We will need follow-up echo 1 month postop which is scheduled. -Would recommend 81 mg of aspirin daily. -Counseled on SBE prophylaxis. -Surgical precautions per surgical team.  2.  Persistent atrial fibrillation status post surgical maze with left atrial appendage clipping -Plan is to go back on Eliquis 5 mg twice daily.  I do agree with this.  3.  Hypertension -Restart metoprolol and home Aldactone.  4. DM -Resume home diabetic medications  [facility] will sign off.   Medication Recommendations: Medications as above. Other recommendations (labs, testing, etc): We will schedule a follow-up echocardiogram. Follow up as an outpatient: He has an appointment to see Dr. Daneen Schick on 10/29/2020  For questions or updates, please contact [place] Please consult [email or web address] for contact info under   Time Spent with Patient: I have spent a total of 25 minutes with patient reviewing hospital notes, telemetry, EKGs, labs and examining the patient as well as establishing an assessment  and plan that was discussed with the patient.  > 50% of time was spent in direct patient care.    Signed, Addison Naegeli. Audie Box, MD, [place]  10/10/2020 9:07 AM

## 2020-10-10 NOTE — Progress Notes (Signed)
      [address]       [place] [address]             [phone]      6 Days Post-Op Procedure(s) (LRB): AORTIC VALVE REPLACEMENT (AVR) (N/A) MAZE (N/A) TRANSESOPHAGEAL ECHOCARDIOGRAM (TEE) (N/A) Subjective: No new problems. He is anxious to go home.   Objective: Vital signs in last 24 hours: Temp:  [97.7 F (36.5 C)-98.1 F (36.7 C)] 97.7 F (36.5 C) (04/06 0315) Pulse Rate:  [62-100] 100 (04/06 0315) Cardiac Rhythm: Normal sinus rhythm (04/05 2020) Resp:  [16-19] 16 (04/06 0315) BP: (120-149)/(74-86) 145/86 (04/06 0315) SpO2:  [96 %-100 %] 97 % (04/06 0315)     Intake/Output from previous day: No intake/output data recorded. Intake/Output this shift: No intake/output data recorded.  General appearance: alert, cooperative and no distress Neurologic: intact Heart: SR with rates ~60-70/min. Lungs: clear to auscultation bilaterally Abdomen: Soft and non-tender.  Extremities: all well perfused with 1+ pretibial edema Wound: The sternotomy incision is well approximated and dry.  Lab Results: No results for input(s): WBC, HGB, HCT, PLT in the last 72 hours. BMET:  No results for input(s): NA, K, CL, CO2, GLUCOSE, BUN, CREATININE, CALCIUM in the last 72 hours.  PT/INR: No results for input(s): LABPROT, INR in the last 72 hours. ABG    Component Value Date/Time   PHART 7.305 (L) 10/05/2020 0030   HCO3 22.4 10/05/2020 0030   TCO2 24 10/05/2020 0030   ACIDBASEDEF 4.0 (H) 10/05/2020 0030   O2SAT 95.0 10/05/2020 0030   CBG (last 3)  Recent Labs    10/09/20 1610 10/09/20 2023 10/10/20 0618  GLUCAP 132* 137* 122*    Assessment/Plan: S/P Procedure(s) (LRB): AORTIC VALVE REPLACEMENT (AVR) (N/A) MAZE (N/A) TRANSESOPHAGEAL ECHOCARDIOGRAM (TEE) (N/A)  -POD6 aortic valve replacement and MAZE for moderate AI and AS in a bicuspid aortic valve and persistent atrial fibrillation.  Pain control better.   Continue rehab with PT, IS.   -History of  atrial fibrillation- s/p MAZE, maintaining SR with rates 60-70.  Will  resume Eliquis.  -Hypertension- control better on amlodipine, lisinopril, and metoprolol.  I have asked cardiology to review regimen prior to discharge.   -Type 2 DM- control resonable. He will continue metformin.   -Volume excess-Wt not documented but still has some peripheral edema. Will resume his spironolactone at discharge.   -Disposition-discharge to home today.     LOS: 6 days    Antony Odea, PA-C [phone]

## 2020-10-10 NOTE — Progress Notes (Signed)
Mobility Specialist: Progress Note   10/10/20 1120  Mobility  Activity Refused mobility   Pt waiting to be wheeled down for discharge.   [facility] Sincere Liuzzi Mobility Specialist Mobility Specialist Phone: [phone]

## 2020-10-10 NOTE — Progress Notes (Signed)
Discharge orders received, IV and telemetry removed, CCMD notified.  Discharge education reviewed with pt and spouse.  Understanding expressed. Pt transported off floor via volunteers, wife to transport home.

## 2020-10-10 NOTE — Progress Notes (Signed)
CARDIAC REHAB PHASE I   D/c education completed with pt and wife. Pt educated on importance of site care and monitoring incisions daily. Encouraged continued IS use, walks, and sternal precautions. Pt given in-the-tube sheet along with heart healthy and diabetic diets. Reviewed restrictions and exercise guidelines. Will refer to CRP II [place]. Walker for home use at bedside.  [id number] Rufina Falco, RN BSN 10/10/2020 9:39 AM

## 2020-10-11 ENCOUNTER — Telehealth: Payer: Self-pay

## 2020-10-11 NOTE — Telephone Encounter (Signed)
Called patient to check on him post surgery. Scheduled to follow up with Dr. Cyndia Bent, Cardiothoracic Surgery. Patient has surgeon hot line phone number to follow up with questions or concerns. Agrees to call pcp as needed.

## 2020-10-15 ENCOUNTER — Other Ambulatory Visit: Payer: Self-pay

## 2020-10-15 ENCOUNTER — Ambulatory Visit
Admission: RE | Admit: 2020-10-15 | Discharge: 2020-10-15 | Disposition: A | Discharge status: Home / Self Care | Payer: 59 | Source: Ambulatory Visit | Attending: Surgery | Admitting: Surgery

## 2020-10-15 ENCOUNTER — Ambulatory Visit (INDEPENDENT_AMBULATORY_CARE_PROVIDER_SITE_OTHER): Payer: Self-pay | Admitting: Physician Assistant

## 2020-10-15 ENCOUNTER — Other Ambulatory Visit: Payer: Self-pay | Admitting: Surgery

## 2020-10-15 ENCOUNTER — Encounter: Payer: Self-pay | Admitting: Physician Assistant

## 2020-10-15 VITALS — BP 128/87 | HR 73 | Resp 20

## 2020-10-15 DIAGNOSIS — R0602 Shortness of breath: Secondary | ICD-10-CM

## 2020-10-15 DIAGNOSIS — Z952 Presence of prosthetic heart valve: Secondary | ICD-10-CM

## 2020-10-15 NOTE — Progress Notes (Signed)
[address]       [place] [address]             [phone]      Dalton [name] is a 55 y.o. male patient who underwent an aortic valve splint, maze procedure, and left atrial appendage clip with Dr. Cyndia Bent.  The morning of postop day 4 he had an episode of right-sided chest pain.  He was evaluated at the bedside by the response team.  His pain was controlled with morphine and Toradol and he had no further episodes of pain.  He was started back on his metoprolol once he remained in normal sinus rhythm for 48 hours.  His Eliquis was restarted at discharge.  Since his hospital stay he has had ups and downs.  Some days he is able to take deep breaths but others he gets very short of breath with limited activity.  He also states he gets short of breath when lying flat at night and has been sleeping with 1-2 pillows.  His pain is still uncontrolled at times which also might be affecting his ability to take deep breaths.   1. S/P AVR    Past Medical History:  Diagnosis Date  . Aortic valve stenosis    a. Bicuspid AV - 2D Echo 06/2014 - mod AS, mild AI, mildly dilated aortic root.  . Arthritis    Neck  . CAD (coronary artery disease)   . Carotid artery disease ([place])    a. Mild by duplex 05/2015 - 1-39% BICA. Repeat due 05/2017.  . Depression   . Diabetes mellitus without complication ([place])   . Dilated aortic root ([place])    a. By echo 06/2014.  [name] Dyspnea    with exercion  . Dysrhythmia    Atrial Fibrilation  . Environmental allergies   . Headache    MIgraines- rare  . Heart murmur   . Hypercholesterolemia   . Hypertension   . Obesity (BMI 30-39.9) 04/26/2018  . PTSD (post-traumatic stress disorder)   . Sleep apnea    CPAP  . SVT (supraventricular tachycardia) ([place])    a. h/o SVT - Ablation done 2013.   No past surgical history pertinent negatives on file. Scheduled Meds: Current Outpatient Medications on File Prior to Visit  Medication Sig Dispense  Refill  . acetaminophen (TYLENOL) 500 MG tablet Take 1,000 mg by mouth every 6 (six) hours as needed for moderate pain or headache.    [name] amLODipine (NORVASC) 10 MG tablet Take 1 tablet (10 mg total) by mouth daily. 30 tablet 2  . apixaban (ELIQUIS) 5 MG TABS tablet Take 1 tablet (5 mg total) by mouth 2 (two) times daily. 180 tablet 3  . aspirin EC 81 MG EC tablet Take 1 tablet (81 mg total) by mouth daily. Swallow whole. 30 tablet 11  . atorvastatin (LIPITOR) 40 MG tablet Take 1 tablet (40 mg total) by mouth daily. 30 tablet 2  . buPROPion (WELLBUTRIN XL) 150 MG 24 hr tablet Take 1 tablet (150 mg total) by mouth daily. 30 tablet 1  . cetirizine (ZYRTEC) 10 MG tablet Take 10 mg by mouth 2 (two) times daily.     . citalopram (CELEXA) 40 MG tablet Take 40 mg by mouth every morning.    . Continuous Blood Gluc Receiver (FREESTYLE LIBRE 2 READER) DEVI Use to check glucose at least TID 1 each 1  . Continuous Blood Gluc Sensor (FREESTYLE LIBRE 2 SENSOR) MISC Use  to check glucose at least Q8H 2 each 11  . FARXIGA 5 MG TABS tablet TAKE 1 TABLET(5 MG) BY MOUTH DAILY BEFORE AND BREAKFAST 30 tablet 2  . fluticasone (FLONASE) 50 MCG/ACT nasal spray Place 2 sprays into both nostrils daily as needed for allergies or rhinitis.    [name] glucose blood test strip Use as instructed to check sugars BID. Dx e11.9 100 each 12  . lamoTRIgine (LAMICTAL) 150 MG tablet Take 1 tablet (150 mg total) by mouth daily. 30 tablet 0  . Lancets (ONETOUCH DELICA PLUS [id number]) MISC USE TWICE DAILY AS DIRECTED 100 each 7  . lisinopril (ZESTRIL) 20 MG tablet Take 20 mg by mouth daily.    . metFORMIN (GLUCOPHAGE) 1000 MG tablet TAKE 1 TABLET BY MOUTH TWICE DAILY (Patient taking differently: Take 1,000 mg by mouth 2 (two) times daily.) 120 tablet 1  . metoprolol succinate (TOPROL-XL) 50 MG 24 hr tablet Take 1 tablet (50 mg total) by mouth daily. 30 tablet 11  . NON FORMULARY as directed. CPAP    . oxyCODONE (OXY IR/ROXICODONE) 5 MG  immediate release tablet Take 1 tablet (5 mg total) by mouth every 4 (four) hours as needed for up to 5 days for severe pain. 24 tablet 0  . QUEtiapine (SEROQUEL) 25 MG tablet Take 1 tablet (25 mg total) by mouth at bedtime. 30 tablet 0  . Semaglutide,0.25 or 0.5MG /DOS, (OZEMPIC, 0.25 OR 0.5 MG/DOSE,) 2 MG/1.5ML SOPN Inject 0.25 mg weekly for 4 weeks then increase to 0.5 mg weekly (Patient taking differently: Inject 0.5 mg as directed every Saturday.) 4.5 mL 1  . spironolactone (ALDACTONE) 25 MG tablet Take 0.5 tablets (12.5 mg total) by mouth daily. 45 tablet 3   No current facility-administered medications on file prior to visit.    No Known Allergies Active Problems:   * No active hospital problems. *  Blood pressure 128/87, pulse 73, resp. rate 20, SpO2 95 %.  Subjective   Dalton [name] is a 55 year old male patient status post aortic valve replacement, Maze procedure, and left atrial appendage clip who presents today with increased shortness of breath.    Objective   Cor: irregularly irregular, muffled heart sounds Pulm: CTA bilaterally, diminished at bilateral lower lobes Abd:no tenderness Wound:c/d/i, no drainage Ext:no pedal edema  CLINICAL DATA:  Shortness of breath.  Recent AVR 11 days ago.  EXAM: CHEST - 2 VIEW  COMPARISON:  CT a chest in chest x-ray dated October 08, 2020.  FINDINGS: Stable cardiomegaly status post AVR and left atrial appendage clipping. Normal pulmonary vascularity. Slightly improved lung volumes. Unchanged small right pleural effusion with adjacent right basilar atelectasis. Resolved left pleural effusion. No pneumothorax. No acute osseous abnormality.  IMPRESSION: 1. Unchanged small right pleural effusion with adjacent right basilar atelectasis. 2. Resolved small left pleural effusion.   Electronically Signed   By: Titus Dubin M.D.   On: 10/15/2020 15:15     Assessment & Plan   Dalton [name] is a 55 year old male patient status  post aortic valve replacement, Maze procedure, and left atrial appendage clip who presents today with increased shortness of breath.  Him and his wife state that he is still having some shortness of breath with activity in addition to shortness of breath at night when laying down for bed.  He has been requiring 1-2 pillows at night and still feels sometimes like he requires more.  He does use CPAP at night for his obstructive sleep apnea and he follows Dr. Radford Pax for  this.  He states that he has good days and bad days with his pain and breathing.  Some days he feels like he can do a lot and then the next day he is exhausted and wants to sleep all day.  He has been using his Chiropodist but he has been getting anywhere from [phone] depending on the day.  He also has felt some fluttering in his chest from time to time.  He did have a Maze procedure but had some trouble with rate controlled atrial fibrillation in the hospital.  We did obtain a rhythm strip today during his visit and it does show rate controlled atrial fibrillation.  He is on Eliquis and low-dose aspirin for protection.  I did reviewed his chest x-ray at the bedside and it does show some small bilateral pleural effusions.  He has been on spironolactone but not on any Lasix.  I discussed this case with Dr. Cyndia Bent who would like a 2D echocardiogram performed tomorrow to rule out pericardial effusion.  The patient agrees to continue to take his pain medicine as needed and to use his incentive spirometer aggressively.  We will hold off on adding lasix to his regimen until the 2D echo cardiogram is performed and reviewed by Dr. Cyndia Bent.  No medication changes were made during this encounter.  Follow-up with Dr. Cyndia Bent and Dr. Tamala Julian.    Elgie Collard 10/15/2020

## 2020-10-16 ENCOUNTER — Other Ambulatory Visit: Payer: Self-pay | Admitting: Surgery

## 2020-10-16 DIAGNOSIS — R0602 Shortness of breath: Secondary | ICD-10-CM

## 2020-10-17 ENCOUNTER — Ambulatory Visit (HOSPITAL_COMMUNITY)
Admission: RE | Admit: 2020-10-17 | Discharge: 2020-10-17 | Disposition: A | Discharge status: Home / Self Care | Payer: 59 | Source: Ambulatory Visit | Attending: Surgery | Admitting: Surgery

## 2020-10-17 ENCOUNTER — Other Ambulatory Visit: Payer: Self-pay

## 2020-10-17 DIAGNOSIS — I779 Disorder of arteries and arterioles, unspecified: Secondary | ICD-10-CM | POA: Insufficient documentation

## 2020-10-17 DIAGNOSIS — I2510 Atherosclerotic heart disease of native coronary artery without angina pectoris: Secondary | ICD-10-CM | POA: Diagnosis not present

## 2020-10-17 DIAGNOSIS — Z952 Presence of prosthetic heart valve: Secondary | ICD-10-CM | POA: Diagnosis not present

## 2020-10-17 DIAGNOSIS — E119 Type 2 diabetes mellitus without complications: Secondary | ICD-10-CM | POA: Diagnosis not present

## 2020-10-17 DIAGNOSIS — I313 Pericardial effusion (noninflammatory): Secondary | ICD-10-CM | POA: Diagnosis not present

## 2020-10-17 DIAGNOSIS — I071 Rheumatic tricuspid insufficiency: Secondary | ICD-10-CM | POA: Insufficient documentation

## 2020-10-17 DIAGNOSIS — R0602 Shortness of breath: Secondary | ICD-10-CM

## 2020-10-17 MED ORDER — PERFLUTREN LIPID MICROSPHERE
1.0000 mL | INTRAVENOUS | Status: AC | PRN
  Administered 2020-10-17: 3 mL via INTRAVENOUS
  Filled 2020-10-17: qty 10

## 2020-10-18 ENCOUNTER — Telehealth: Payer: Self-pay | Admitting: *Deleted

## 2020-10-18 NOTE — Telephone Encounter (Signed)
The [place] STD paperwork faxed to [phone]. Est. Leave- 10/04/20-12/27/2020. Original mailed to patients home address.

## 2020-10-19 LAB — ECHOCARDIOGRAM COMPLETE
AR max vel: 2.59 cm2
AV Area VTI: 3.32 cm2
AV Area mean vel: 2.83 cm2
AV Mean grad: 9 mmHg
AV Peak grad: 16.2 mmHg
Ao pk vel: 2.01 m/s
Calc EF: 59.1 %
Single Plane A2C EF: 60.9 %
Single Plane A4C EF: 51.9 %

## 2020-10-22 ENCOUNTER — Ambulatory Visit: Payer: 59 | Admitting: Pharmacist

## 2020-10-22 DIAGNOSIS — I4891 Unspecified atrial fibrillation: Secondary | ICD-10-CM

## 2020-10-22 DIAGNOSIS — I10 Essential (primary) hypertension: Secondary | ICD-10-CM

## 2020-10-22 DIAGNOSIS — I5042 Chronic combined systolic (congestive) and diastolic (congestive) heart failure: Secondary | ICD-10-CM

## 2020-10-22 DIAGNOSIS — E785 Hyperlipidemia, unspecified: Secondary | ICD-10-CM

## 2020-10-22 DIAGNOSIS — E1165 Type 2 diabetes mellitus with hyperglycemia: Secondary | ICD-10-CM

## 2020-10-22 MED ORDER — LISINOPRIL 20 MG PO TABS: 20.0000 mg | ORAL_TABLET | Freq: Every day | ORAL | 1 refills | Status: DC

## 2020-10-22 NOTE — Patient Instructions (Signed)
  Visit Information  Goals Addressed              This Visit's Progress   .  Medication Monitoring (pt-stated)        Patient Goals/Self-Care Activities . Over the next days, patient will:  - take medications as prescribed check blood glucose three times daily using CGm, document, and provide at future appointments.        Patient verbalizes understanding of instructions provided today and agrees to view in [place].   Telephone follow up appointment with care management team member scheduled for: ~7 weeks  Lorel Monaco, PharmD, [place]

## 2020-10-22 NOTE — Chronic Care Management (AMB) (Addendum)
Care Management   Pharmacy Note  10/22/2020 Name: Dalton [name] MRN: [id number] DOB: [birth year]  Subjective: Dalton [name] is a 55 y.o. year old male who is a primary care patient of Einar Pheasant, MD. The Care Management team was consulted for assistance with care management and care coordination needs.    Engaged with patient by telephone for follow up visit in response to provider referral for pharmacy case management and/or care coordination services.   The patient was given information about Care Management services today including:  1. Care Management services includes personalized support from designated clinical staff supervised by the patient's primary care provider, including individualized plan of care and coordination with other care providers. 2. 24/7 contact phone numbers for assistance for urgent and routine care needs. 3. The patient may stop case management services at any time by phone call to the office staff.  Patient agreed to services and consent obtained.  Assessment:  Review of patient status, including review of consultants reports, laboratory and other test data, was performed as part of comprehensive evaluation and provision of chronic care management services.   SDOH (Social Determinants of Health) assessments and interventions performed:  SDOH Interventions   Flowsheet Row Most Recent Value  SDOH Interventions   Financial Strain Interventions Intervention Not Indicated       Objective:  Lab Results  Component Value Date   CREATININE 1.00 10/07/2020   CREATININE 1.39 (H) 10/06/2020   CREATININE 1.41 (H) 10/05/2020    Lab Results  Component Value Date   HGBA1C 6.1 (H) 09/20/2020       Component Value Date/Time   CHOL 137 04/26/2020 1133   TRIG 136.0 04/26/2020 1133   HDL 35.90 (L) 04/26/2020 1133   CHOLHDL 4 04/26/2020 1133   VLDL 27.2 04/26/2020 1133   [place] 74 04/26/2020 1133   LDLDIRECT 228.0 05/14/2018 0951   Clinical  ASCVD: Yes s/p AVR  The 10-year ASCVD risk score [name], et al., 2013) is: 10.7%   Values used to calculate the score:     Age: 59 years     Sex: Male     Is Non-Hispanic African American: No     Diabetic: Yes     Tobacco smoker: No     Systolic Blood Pressure: 315 mmHg     Is BP treated: Yes     HDL Cholesterol: 35.9 mg/dL     Total Cholesterol: 137 mg/dL    CHA2DS2-VASc Score = 3  This indicates a 3.2% annual risk of stroke. The patient's score is based upon: CHF History: Yes HTN History: Yes Diabetes History: Yes Stroke History: No Vascular Disease History: No Age Score: 0 Gender Score: 0  BP Readings from Last 3 Encounters:  10/15/20 128/87  10/10/20 133/83  10/02/20 (!) 121/92    Care Plan  No Known Allergies  Medications Reviewed Today    Reviewed by Avie Arenas, RPH (Pharmacist) on 10/22/20 at 1120  Med List Status: <None>  Medication Order Taking? Sig Documenting Provider Last Dose Status Informant  acetaminophen (TYLENOL) 500 MG tablet [id number] Yes Take 1,000 mg by mouth every 6 (six) hours as needed for moderate pain or headache. [provider] Taking Active Self  amLODipine (NORVASC) 10 MG tablet [id number] Yes Take 1 tablet (10 mg total) by mouth daily. Antony Odea, PA-C Taking Active   apixaban (ELIQUIS) 5 MG TABS tablet [id number] Yes Take 1 tablet (5 mg total) by mouth 2 (two) times daily. Daneen Schick  W, MD Taking Active Self  aspirin EC 81 MG EC tablet [id number] Yes Take 1 tablet (81 mg total) by mouth daily. Swallow whole. Antony Odea, PA-C Taking Active   atorvastatin (LIPITOR) 40 MG tablet [id number] Yes Take 1 tablet (40 mg total) by mouth daily. Einar Pheasant, MD Taking Active Self  buPROPion (WELLBUTRIN XL) 150 MG 24 hr tablet [id number] Yes Take 1 tablet (150 mg total) by mouth daily. Cloria Spring, MD Taking Active   cetirizine (ZYRTEC) 10 MG tablet [id number] Yes Take 10 mg by mouth 2 (two) times daily.  [provider] Taking Active Self           Med Note [name], [name]   Tue Aug 10, 2018  8:35 PM)    citalopram (CELEXA) 40 MG tablet [id number] Yes Take 40 mg by mouth every morning. [provider] Taking Active Self  Continuous Blood Gluc Receiver (FREESTYLE LIBRE 2 READER) DEVI [id number] No Use to check glucose at least TID  Patient not taking: Reported on 10/22/2020   Einar Pheasant, MD Not Taking Active Self  Continuous Blood Gluc Sensor (FREESTYLE LIBRE 2 SENSOR) MISC [id number] No Use to check glucose at least Q8H  Patient not taking: Reported on 10/22/2020   Einar Pheasant, MD Not Taking Active Self  FARXIGA 5 MG TABS tablet [id number] Yes TAKE 1 TABLET(5 MG) BY MOUTH DAILY BEFORE AND Laveda Norman, MD Taking Active   fluticasone (FLONASE) 50 MCG/ACT nasal spray [id number] Yes Place 2 sprays into both nostrils daily as needed for allergies or rhinitis. [provider] Taking Active Self  glucose blood test strip [id number]  Use as instructed to check sugars BID. Dx e11.9 Einar Pheasant, MD  Active Self  lamoTRIgine (LAMICTAL) 150 MG tablet [id number]  Take 1 tablet (150 mg total) by mouth daily. [name], MD  Active Self  Lancets (ONETOUCH DELICA PLUS [id number]) [place] [id number]  USE TWICE DAILY AS DIRECTED Einar Pheasant, MD  Active Self  lisinopril (ZESTRIL) 20 MG tablet [id number] Yes Take 20 mg by mouth daily. [provider] Taking Active Self  metFORMIN (GLUCOPHAGE) 1000 MG tablet [id number] Yes TAKE 1 TABLET BY MOUTH TWICE DAILY  Patient taking differently: Take 1,000 mg by mouth 2 (two) times daily.   Einar Pheasant, MD Taking Active Self  metoprolol succinate (TOPROL-XL) 50 MG 24 hr tablet [id number] Yes Take 1 tablet (50 mg total) by mouth daily. [name], MD Taking Active Self  [name] [id number] Yes as directed. CPAP [provider] Taking Active Self  QUEtiapine (SEROQUEL) 25 MG tablet [id number] Yes Take 1 tablet (25  mg total) by mouth at bedtime. Cloria Spring, MD Taking Active   Semaglutide,0.25 or 0.5MG /DOS, (OZEMPIC, 0.25 OR 0.5 MG/DOSE,) 2 MG/1.5ML SOPN [id number] Yes Inject 0.25 mg weekly for 4 weeks then increase to 0.5 mg weekly  Patient taking differently: Inject 0.5 mg as directed every Saturday.   Einar Pheasant, MD Taking Active Self  spironolactone (ALDACTONE) 25 MG tablet [id number] Yes Take 0.5 tablets (12.5 mg total) by mouth daily. [name], MD Taking Active Self          Patient Active Problem List   Diagnosis Date Noted  . S/P AVR 10/04/2020  . PTSD (post-traumatic stress disorder) 04/28/2020  . Thyroid nodule 04/28/2020  . Healthcare maintenance 12/26/2019  . Chronic combined systolic and diastolic heart failure ([place])   . Abdominal pain 12/05/2018  . Type 2 diabetes mellitus with hyperglycemia ([place]) 12/05/2018  .  Portal hypertensive gastropathy ([place]) 09/16/2018  . Abscess of left groin 07/23/2018  . Obesity, diabetes, and hypertension syndrome ([place]) 04/26/2018  . History of colon polyps 07/05/2017  . Chronic anticoagulation 01/09/2017  . Low testosterone level in male 09/21/2016  . Atrial fibrillation ([place]) 05/23/2016  . Sleep apnea 05/11/2016  . Nasal congestion 08/26/2015  . Environmental allergies 07/22/2015  . Essential hypertension 06/08/2014  . Hyperlipidemia 06/08/2014  . Aortic valve stenosis     Conditions to be addressed/monitored: CHF, HTN, HLD and DMII  Care Plan : Medication Management  Updates made by Khaalid Lefkowitz A, RPH since 10/22/2020 12:00 AM    Problem: Diabetes, Heart Failure, Aortic Disease     Long-Range Goal: Disease Progression Prevention   This Visit's Progress: On track  Recent Progress: On track  Priority: High  Note:   Current Barriers:  . Unable to achieve control of diabetes   Pharmacist Clinical Goal(s):  [name] Over the next 90 days, patient will achieve control of diabetes as evidenced by improvement in A1c through collaboration  with PharmD and provider.   Interventions: . 1:1 collaboration with Einar Pheasant, MD regarding development and update of comprehensive plan of care as evidenced by provider attestation and co-signature . Inter-disciplinary care team collaboration (see longitudinal plan of care) . Comprehensive medication review performed; medication list updated in electronic medical record  Health Maintenance: . Declines COVID booster . S/P aortic valve replacement surgery - 5/80/99  Diabetes, complicated by heart failure (last EF 35%) . Controlled; current treatment: Farxiga 5 mg daily, metformin 1000 mg BID, Ozempic 0.5 mg weekly  . Wife denies GI upset at this time on metformin IR 1000 mg tablets. Denies GI upset since starting Ozempic.  . Current glucose readings: 4/8 - 4/17 o Fasting glucose: 144, 133, 138, 108, 134, 119, 113 o After meal glucose: 154, 152, 151 o Denies BG <70 . Wife notes pt hasn't started using CGM yet.  . Recommended to continue current regimen . If symptomatic hypoglycemia becomes a concern, consider reduction in metformin rather than Ozempic or [place].   Heart Failure (EF 35%): [name] Appropriately managed; current treatment: lisinopril 20 mg QAM, spironolactone 12.5 mg QHS, metoprolol succinate 50 mg QAM, Farxiga 5 mg daily  . S/P aortic valve replacement surgery - 10/04/20 o Wife reports pt feeling weak but gradually improving. Reports SOB has improved, but has not completely resolved yet. Denies headaches and blurry vision o Pt and wife awaiting echo results . Home BP readings: 162/71, 126/78, 128/65, 131/85, 130/85, 127/69, 130/87 . Weighing daily - 248 lbs yesterday 4/17 (~260 lbs prior to AVR procedure). Aware of concerning weight increases . Pt requested current prescription of lisinopril 20 mg daily be sent to [facility] on [address] . Continue current regimen at this time. Can consider [place] maximization in the future.   Hyperlipidemia and ASCVD risk  reduction: . Uncontrolled, LDL not at goal <70 given diabetes and risk factors; current treatment: atorvastatin 40 mg daily . Antiplatelet therapy: aspirin 81 mg daily . Recommended to continue current regimen. Will discuss potential for increasing statin intensity at future visits  Atrial Fibrillation: . Controlled; current rate control: metoprolol succinate 50 mg daily; anticoagulant treatment: Eliquis 5 mg BID . Denies cost concerns at this time . Recommended to continue current regimen  along with collaboration with cardiology  Mental Health: [name] Moderately well managed. Current regimen: bupropion XL 150 mg daily, citalopram 40 mg daily, lamotrigine 150 mg daily, quetiapine 25 mg QPM, missed last appt  w/ Dr. Modesta Messing, follows w/ LCSW . Recommended to continue current regimen and collaboration with psychiatry at this time  Allergies: . Controlled per patient report; cetirizine 10 mg BID, fluticasone intranasal PRN . Recommended to continue current regimen at this time  Patient Goals/Self-Care Activities . Over the next days, patient will:  - take medications as prescribed check blood glucose at least three times daily using CGM, document, and provide at future appointments  Follow Up Plan: Telephone follow up appointment with care management team member scheduled for: ~7 weeks     Medication Assistance:  None required.  Patient affirms current coverage meets needs.  Follow Up:  Patient agrees to Care Plan and Follow-up.  Plan: Telephone follow up appointment with care management team member scheduled for:  ~7 weeks  Lorel Monaco, PharmD, BCPS PGY2 [place]   I was present for this visit and agree with the documentation by the resident as above.   Catie Darnelle Maffucci, PharmD, [place], [place] Clinical Pharmacist [employer] [facility] [place]

## 2020-10-23 ENCOUNTER — Telehealth: Payer: Self-pay

## 2020-10-23 ENCOUNTER — Other Ambulatory Visit: Payer: Self-pay | Admitting: Physician Assistant

## 2020-10-23 ENCOUNTER — Telehealth (HOSPITAL_COMMUNITY): Payer: Self-pay

## 2020-10-23 MED ORDER — POTASSIUM CHLORIDE ER 20 MEQ PO TBCR: 20.0000 meq | EXTENDED_RELEASE_TABLET | Freq: Every day | ORAL | 0 refills | Status: DC

## 2020-10-23 MED ORDER — FUROSEMIDE 40 MG PO TABS: 40.0000 mg | ORAL_TABLET | Freq: Every day | ORAL | 0 refills | Status: DC

## 2020-10-23 NOTE — Telephone Encounter (Signed)
Attempted to call patient in regards to Cardiac Rehab - LM on VM 

## 2020-10-23 NOTE — Progress Notes (Signed)
      [address]       [place] [address]             [phone]       Echocardiogram reviewed. No significant pericardial effusion but EF down to 35% with HF symptoms. He sees Dr. Tamala Julian next week. I will give him a week of lasix and potassium. Suggest daily weights.   F/u with Cardiology next week.   Nicholes Rough, PA-C

## 2020-10-23 NOTE — Telephone Encounter (Signed)
-----   Message from Synthia Innocent sent at 10/23/2020  9:10 AM EDT ----- Regarding: FW: please call patient I think this was supposed to be sent to the nurses. Can one of you assist Tessa with this?  Thanks ----- Message ----- From: Miguel Aschoff Sent: 10/23/2020   8:42 AM EDT To: [facility] Admin Pool Subject: please call patient                             Good morning,   I received an in-basket message about this patient and I reviewed his Echo from last week. I think he needs a week of lasix with potassium for his HF symptoms. EF was only 35%, no significant pericardial effusion. He has f/u with cards next week.   I sent the orders but if you could call the patient that would be helpful!  Thank you!  Johann Capers

## 2020-10-23 NOTE — Telephone Encounter (Signed)
Patient's wife made aware of medications called into [facility] off [address] in [place]. She acknowledged receipt.

## 2020-10-23 NOTE — Telephone Encounter (Signed)
Pt insurance is active and benefits verified through [facility]. Co-pay $0.00, DED $1,200.00/$1,200.00 met, out of pocket $3,200.00/$3,200.00 met, co-insurance 20%. No pre-authorization required. Passport, 10/23/20 @ 9:26AM, LRT#[id number]  Will contact patient to see if he is interested in the Cardiac Rehab Program. If interested, patient will need to complete follow up appt. Once completed, patient will be contacted for scheduling upon review by the RN Navigator.

## 2020-10-26 ENCOUNTER — Telehealth: Payer: Managed Care, Other (non HMO)

## 2020-10-27 ENCOUNTER — Other Ambulatory Visit: Payer: Self-pay | Admitting: Physician Assistant

## 2020-10-28 NOTE — Progress Notes (Signed)
Cardiology Office Note:    Date:  10/29/2020   ID:  Dalton [name], DOB July 09, 1965, MRN [id number]  PCP:  Einar Pheasant, MD  Cardiologist:  Sinclair Grooms, MD   Referring MD: Einar Pheasant, MD   Chief Complaint  Patient presents with  . Congestive Heart Failure  . Thoracic Aortic Aneurysm  . Cardiac Valve Problem    Aortic valve replacement with bioprosthesis.    History of Present Illness:    Dalton [name] is a 55 y.o. male with a hx of biscupid aortic valve with AS/AR, prior SVT s/p RF ablation, HTN, HLD, AVR 09/2020, primary hypertension, hyperlipidemia, DM II, OSA on CPAP, and chronic combined systolic and diastolic HF EF 01% 12/107.  He feels tired and rundown.  Some orthostatic dizziness is noted.  Appetite is not as good as presurgery.  He denies shortness of breath, orthopnea, and PND.  There is no peripheral edema.  No medication side effects that he can tell.  Past Medical History:  Diagnosis Date  . Aortic valve stenosis    a. Bicuspid AV - 2D Echo 06/2014 - mod AS, mild AI, mildly dilated aortic root.  . Arthritis    Neck  . CAD (coronary artery disease)   . Carotid artery disease ([place])    a. Mild by duplex 05/2015 - 1-39% BICA. Repeat due 05/2017.  . Depression   . Diabetes mellitus without complication ([place])   . Dilated aortic root ([place])    a. By echo 06/2014.  [name] Dyspnea    with exercion  . Dysrhythmia    Atrial Fibrilation  . Environmental allergies   . Headache    MIgraines- rare  . Heart murmur   . Hypercholesterolemia   . Hypertension   . Obesity (BMI 30-39.9) 04/26/2018  . PTSD (post-traumatic stress disorder)   . Sleep apnea    CPAP  . SVT (supraventricular tachycardia) ([place])    a. h/o SVT - Ablation done 2013.    Past Surgical History:  Procedure Laterality Date  . AORTIC VALVE REPLACEMENT N/A 10/04/2020   Procedure: AORTIC VALVE REPLACEMENT (AVR);  Surgeon: Gaye Pollack, MD;  Location: [facility];  Service: Open Heart Surgery;   Laterality: N/A;  . CARDIAC ELECTROPHYSIOLOGY STUDY AND ABLATION  08/21/2011  . COLONOSCOPY WITH PROPOFOL N/A 06/26/2017   Procedure: COLONOSCOPY WITH PROPOFOL;  Surgeon: Manya Silvas, MD;  Location: [facility] ENDOSCOPY;  Service: Endoscopy;  Laterality: N/A;  . ESOPHAGOGASTRODUODENOSCOPY (EGD) WITH PROPOFOL N/A 06/26/2017   Procedure: ESOPHAGOGASTRODUODENOSCOPY (EGD) WITH PROPOFOL;  Surgeon: Manya Silvas, MD;  Location: [facility] ENDOSCOPY;  Service: Endoscopy;  Laterality: N/A;  . MAZE N/A 10/04/2020   Procedure: MAZE;  Surgeon: Gaye Pollack, MD;  Location: [place];  Service: Open Heart Surgery;  Laterality: N/A;  . RIGHT/LEFT HEART CATH AND CORONARY ANGIOGRAPHY N/A 11/16/2019   Procedure: RIGHT/LEFT HEART CATH AND CORONARY ANGIOGRAPHY;  Surgeon: [name], MD;  Location: [facility] CV LAB;  Service: Cardiovascular;  Laterality: N/A;  . SEPTOPLASTY N/A 03/22/2015   Procedure: SEPTOPLASTY  WITH RIGHT INFERIOR TURBINATE REDUCTION;  Surgeon: Margaretha Sheffield, MD;  Location: [facility];  Service: ENT;  Laterality: N/A;  . SUPRAVENTRICULAR TACHYCARDIA ABLATION N/A 08/21/2011   Procedure: SUPRAVENTRICULAR TACHYCARDIA ABLATION;  Surgeon: Evans Lance, MD;  Location: [facility] CATH LAB;  Service: Cardiovascular;  Laterality: N/A;  . surgery for non descending testicle    . TEE WITHOUT CARDIOVERSION N/A 11/16/2019   Procedure: TRANSESOPHAGEAL ECHOCARDIOGRAM (TEE);  Surgeon:  Geralynn Rile, MD;  Location: [facility];  Service: Cardiovascular;  Laterality: N/A;  . TEE WITHOUT CARDIOVERSION N/A 10/04/2020   Procedure: TRANSESOPHAGEAL ECHOCARDIOGRAM (TEE);  Surgeon: Gaye Pollack, MD;  Location: [place];  Service: Open Heart Surgery;  Laterality: N/A;  . TONSILLECTOMY      Current Medications: Current Meds  Medication Sig  . acetaminophen (TYLENOL) 500 MG tablet Take 1,000 mg by mouth every 6 (six) hours as needed for moderate pain or headache.  [name] amLODipine (NORVASC) 10 MG tablet Take 1  tablet (10 mg total) by mouth daily.  [name] apixaban (ELIQUIS) 5 MG TABS tablet Take 1 tablet (5 mg total) by mouth 2 (two) times daily.  [name] aspirin EC 81 MG EC tablet Take 1 tablet (81 mg total) by mouth daily. Swallow whole.  [name] atorvastatin (LIPITOR) 40 MG tablet Take 1 tablet (40 mg total) by mouth daily.  [name] buPROPion (WELLBUTRIN XL) 150 MG 24 hr tablet Take 1 tablet (150 mg total) by mouth daily.  . cetirizine (ZYRTEC) 10 MG tablet Take 10 mg by mouth 2 (two) times daily.   . citalopram (CELEXA) 40 MG tablet Take 40 mg by mouth every morning.  . Continuous Blood Gluc Receiver (FREESTYLE LIBRE 2 READER) DEVI Use to check glucose at least TID  . Continuous Blood Gluc Sensor (FREESTYLE LIBRE 2 SENSOR) MISC Use to check glucose at least Q8H  . FARXIGA 5 MG TABS tablet TAKE 1 TABLET(5 MG) BY MOUTH DAILY BEFORE AND BREAKFAST  . fluticasone (FLONASE) 50 MCG/ACT nasal spray Place 2 sprays into both nostrils daily as needed for allergies or rhinitis.  . furosemide (LASIX) 40 MG tablet Take 1 tablet (40 mg total) by mouth daily for 7 days.  [name] glucose blood test strip Use as instructed to check sugars BID. Dx e11.9  . lamoTRIgine (LAMICTAL) 150 MG tablet Take 1 tablet (150 mg total) by mouth daily.  . Lancets (ONETOUCH DELICA PLUS [id number]) MISC USE TWICE DAILY AS DIRECTED  . lisinopril (ZESTRIL) 20 MG tablet Take 1 tablet (20 mg total) by mouth daily.  . metFORMIN (GLUCOPHAGE) 1000 MG tablet TAKE 1 TABLET BY MOUTH TWICE DAILY (Patient taking differently: Take 1,000 mg by mouth 2 (two) times daily.)  . metoprolol succinate (TOPROL-XL) 50 MG 24 hr tablet Take 1 tablet (50 mg total) by mouth daily.  . NON FORMULARY as directed. CPAP  . QUEtiapine (SEROQUEL) 25 MG tablet Take 1 tablet (25 mg total) by mouth at bedtime.  . Semaglutide,0.25 or 0.5MG/DOS, (OZEMPIC, 0.25 OR 0.5 MG/DOSE,) 2 MG/1.5ML SOPN Inject 0.25 mg weekly for 4 weeks then increase to 0.5 mg weekly (Patient taking differently: Inject 0.5 mg as  directed every Saturday.)  . spironolactone (ALDACTONE) 25 MG tablet Take 0.5 tablets (12.5 mg total) by mouth daily.     Allergies:   Patient has no known allergies.   Social History   Socioeconomic History  . Marital status: Married    Spouse name: Not on file  . Number of children: 2  . Years of education: Not on file  . Highest education level: Not on file  Occupational History  . Occupation: truck Education administrator: Insurance account manager  Tobacco Use  . Smoking status: Former Smoker    Types: Cigarettes    Quit date: 11/04/1988    Years since quitting: 32.0  . Smokeless tobacco: Never Used  Vaping Use  . Vaping Use: Never used  Substance and Sexual Activity  . Alcohol use:  Yes    Comment: occasionally  . Drug use: Not Currently    Comment: many years ago in highschool  . Sexual activity: Not Currently  Other Topics Concern  . Not on file  Social History Narrative   Very rare exercise   Social Determinants of Health   Financial Resource Strain: Low Risk   . Difficulty of Paying Living Expenses: Not hard at all  Food Insecurity: Not on file  Transportation Needs: Not on file  Physical Activity: Not on file  Stress: Not on file  Social Connections: Not on file     Family History: The patient's family history includes Aortic stenosis in his maternal grandfather; Breast cancer in his maternal grandmother; Cancer in his maternal grandfather and another family member; Diabetes in an other family member.  ROS:   Please see the history of present illness.    Seems to have some degree of depression.  Also be certain he is recover before he goes back to work.  He is sleeping a lot.  Eating only 2 meals per day.  He has occasional nausea.  All other systems reviewed and are negative.  EKGs/Labs/Other Studies Reviewed:    The following studies were reviewed today:  ECHOCARDIOGRAM 10/17/2020: IMPRESSIONS    1. A small pericardial effusion is present. The pericardial  effusion is  circumferential. There is no evidence of cardiac tamponade.  2. Left ventricular ejection fraction, by estimation, is 35%. The left  ventricle has moderately decreased function. The left ventricle  demonstrates global hypokinesis. Left ventricular diastolic parameters are  indeterminate.  3. Right ventricular systolic function is moderately reduced. The right  ventricular size is mildly enlarged. There is normal pulmonary artery  systolic pressure. The estimated right ventricular systolic pressure is  43.1 mmHg.  4. Left atrial size was mild to moderately dilated.  5. Right atrial size was mildly dilated.  6. The mitral valve is grossly normal. Trivial mitral valve  regurgitation. No evidence of mitral stenosis.  7. The aortic valve has been repaired/replaced. Aortic valve  regurgitation is not visualized. There is a 25 mm Edwards pericardial  valve present in the aortic position. Procedure Date: 10/04/2020. Echo  findings are consistent with normal structure and  function of the aortic valve prosthesis. Aortic valve mean gradient  measures 9.0 mmHg.  8. The inferior vena cava is normal in size with <50% respiratory  variability, suggesting right atrial pressure of 8 mmHg.   Conclusion(s)/Recommendation(s): No left ventricular mural or apical  thrombus/thrombi.   EKG:  EKG sinus rhythm, biatrial abnormality, and when compared to the prior tracing From October 08, 2020, changes noted.  Recent Labs: 10/02/2020: ALT 44 10/05/2020: Magnesium 2.5 10/07/2020: BUN 20; Creatinine, Ser 1.00; Hemoglobin 12.0; Platelets 89; Potassium 3.7; Sodium 133  Recent Lipid Panel    Component Value Date/Time   CHOL 137 04/26/2020 1133   TRIG 136.0 04/26/2020 1133   HDL 35.90 (L) 04/26/2020 1133   CHOLHDL 4 04/26/2020 1133   VLDL 27.2 04/26/2020 1133   LDLCALC 74 04/26/2020 1133   LDLDIRECT 228.0 05/14/2018 0951    Physical Exam:    VS:  BP (!) 102/7   Pulse 66   Ht 6' 1"  (5.400  m)   Wt 248 lb (112.5 kg)   BMI 32.72 kg/m     Wt Readings from Last 3 Encounters:  10/29/20 248 lb (112.5 kg)  10/08/20 273 lb 13 oz (124.2 kg)  10/02/20 266 lb 14.4 oz (121.1 kg)  GEN: Good skin color.. No acute distress HEENT: Normal NECK: No JVD. LYMPHATICS: No lymphadenopathy CARDIAC: No diastolic murmur. RRR no gallop, or edema. VASCULAR:  Normal Pulses. No bruits. RESPIRATORY:  Clear to auscultation without rales, wheezing or rhonchi  ABDOMEN: Soft, non-tender, non-distended, No pulsatile mass, MUSCULOSKELETAL: No deformity  SKIN: Warm and dry NEUROLOGIC:  Alert and oriented x 3 PSYCHIATRIC:  Normal affect   ASSESSMENT:    1. S/P AVR (aortic valve replacement)   2. Persistent atrial fibrillation ([place])   3. Chronic combined systolic and diastolic HF (heart failure), NYHA class 2 ([place])   4. Coronary artery disease of native artery of native heart with stable angina pectoris ([place])   5. Hyperlipidemia, unspecified hyperlipidemia type   6. Chronic anticoagulation   7. Obstructive sleep apnea syndrome   8. Essential hypertension    PLAN:    In order of problems listed above:  1. Auscultation is without evidence of regurgitation or other problem. 2. In sinus rhythm today.  Had Maze procedure at the time of surgery. 3. LVEF 35% on a recent echo done close to surgery.  Will discontinue lisinopril, 48 hours later start Entresto 24/26 mg twice daily.  Follow-up in 2 to 3 weeks for further up titration and Entresto dose.  Continue metoprolol, Farxiga, and Aldactone. 4. Continue high intensity statin therapy. 5. Continue high intensity statin therapy. 6. Continue Eliquis 5 mg twice daily. 7. Encouraged CPAP use. 8. Blood pressure is relatively low and may be the cause for the patient's orthostatic dizziness.  We will discontinue amlodipine.  Discontinue lisinopril.  After 48 hours, start Entresto 24/26 mg twice daily.  Be met in 1 week.  Clinical follow-up in 2 to 3  weeks.   Medication Adjustments/Labs and Tests Ordered: Current medicines are reviewed at length with the patient today.  Concerns regarding medicines are outlined above.  No orders of the defined types were placed in this encounter.  No orders of the defined types were placed in this encounter.   There are no Patient Instructions on file for this visit.   Signed, Sinclair Grooms, MD  10/29/2020 10:43 AM    [place]

## 2020-10-29 ENCOUNTER — Other Ambulatory Visit: Payer: Self-pay | Admitting: Surgery

## 2020-10-29 ENCOUNTER — Other Ambulatory Visit: Payer: Self-pay

## 2020-10-29 ENCOUNTER — Encounter: Payer: Self-pay | Admitting: Interventional Cardiology

## 2020-10-29 ENCOUNTER — Ambulatory Visit (INDEPENDENT_AMBULATORY_CARE_PROVIDER_SITE_OTHER): Payer: 59 | Admitting: Interventional Cardiology

## 2020-10-29 VITALS — BP 102/7 | HR 66 | Ht 73.0 in | Wt 248.0 lb

## 2020-10-29 DIAGNOSIS — I5042 Chronic combined systolic (congestive) and diastolic (congestive) heart failure: Secondary | ICD-10-CM

## 2020-10-29 DIAGNOSIS — Z952 Presence of prosthetic heart valve: Secondary | ICD-10-CM

## 2020-10-29 DIAGNOSIS — Z7901 Long term (current) use of anticoagulants: Secondary | ICD-10-CM

## 2020-10-29 DIAGNOSIS — I4819 Other persistent atrial fibrillation: Secondary | ICD-10-CM

## 2020-10-29 DIAGNOSIS — I25118 Atherosclerotic heart disease of native coronary artery with other forms of angina pectoris: Secondary | ICD-10-CM

## 2020-10-29 DIAGNOSIS — E785 Hyperlipidemia, unspecified: Secondary | ICD-10-CM

## 2020-10-29 DIAGNOSIS — I10 Essential (primary) hypertension: Secondary | ICD-10-CM

## 2020-10-29 DIAGNOSIS — G4733 Obstructive sleep apnea (adult) (pediatric): Secondary | ICD-10-CM

## 2020-10-29 MED ORDER — SACUBITRIL-VALSARTAN 24-26 MG PO TABS: 1.0000 | ORAL_TABLET | Freq: Two times a day (BID) | ORAL | 11 refills | Status: DC

## 2020-10-29 NOTE — Telephone Encounter (Signed)
Pt in today to see Dr. Tamala Julian.  States paperwork has been completed by Dr. Vivi Martens office.  Pt is going to double check at his appt with him on Wednesday.  Advised I will keep his paperwork that is here and to let me know if we do end up needing to fill anything out.  Pt agreeable to plan.

## 2020-10-29 NOTE — Patient Instructions (Signed)
Medication Instructions:  1) DISCONTINUE Lisinopril and Amlodipine 2) On Thursday, START Entresto 24/26mg  twice daily.  *If you need a refill on your cardiac medications before your next appointment, please call your pharmacy*   Lab Work: None  If you have labs (blood work) drawn today and your tests are completely normal, you will receive your results only by: [name] MyChart Message (if you have MyChart) OR . A paper copy in the mail If you have any lab test that is abnormal or we need to change your treatment, we will call you to review the results.   Testing/Procedures: None   Follow-Up: At [facility], you and your health needs are our priority.  As part of our continuing mission to provide you with exceptional heart care, we have created designated Provider Care Teams.  These Care Teams include your primary Cardiologist (physician) and Advanced Practice Providers (APPs -  Physician Assistants and Nurse Practitioners) who all work together to provide you with the care you need, when you need it.  We recommend signing up for the patient portal called "MyChart".  Sign up information is provided on this After Visit Summary.  MyChart is used to connect with patients for Virtual Visits (Telemedicine).  Patients are able to view lab/test results, encounter notes, upcoming appointments, etc.  Non-urgent messages can be sent to your provider as well.   To learn more about what you can do with MyChart, go to [email or web address].    Your next appointment:   2 week(s)  The format for your next appointment:   In Person  Provider:   You may see Sinclair Grooms, MD or one of the following Advanced Practice Providers on your designated Care Team:    Kathyrn Drown, NP    Other Instructions

## 2020-10-30 NOTE — Addendum Note (Signed)
Addended by: Jacinta Shoe on: 10/30/2020 04:17 PM   Modules accepted: Orders

## 2020-10-31 ENCOUNTER — Other Ambulatory Visit: Payer: Self-pay

## 2020-10-31 ENCOUNTER — Ambulatory Visit
Admission: RE | Admit: 2020-10-31 | Discharge: 2020-10-31 | Disposition: A | Discharge status: Home / Self Care | Payer: 59 | Source: Ambulatory Visit | Attending: Surgery | Admitting: Surgery

## 2020-10-31 ENCOUNTER — Ambulatory Visit (INDEPENDENT_AMBULATORY_CARE_PROVIDER_SITE_OTHER): Payer: Self-pay | Admitting: Surgery

## 2020-10-31 VITALS — BP 113/77 | HR 70 | Resp 20 | Ht 73.0 in | Wt 247.0 lb

## 2020-10-31 DIAGNOSIS — I350 Nonrheumatic aortic (valve) stenosis: Secondary | ICD-10-CM

## 2020-10-31 DIAGNOSIS — Z952 Presence of prosthetic heart valve: Secondary | ICD-10-CM

## 2020-10-31 NOTE — Progress Notes (Signed)
HPI:  Dalton [name] returns today for follow-up status post aortic valve replacement with a 25 mm Edwards pericardial valve, biatrial Maze procedure and clipping of left atrial appendage on 10/04/2020.  He has had a slow postoperative recovery with fatigue and some orthostatic dizziness as well as decreased appetite.  He has been walking without chest pain or shortness of breath.  He says he has some good days and bad days.  When I saw him in the office a couple weeks ago we ordered a follow-up echocardiogram to rule out a significant pericardial effusion or tamponade.  He was hemodynamically stable but his symptoms were unusual.  The echo on 10/17/2020 showed a small pericardial effusion without tamponade.  Left ventricular ejection fraction was 35% with global hypokinesis.  The aortic valve is functioning normally with a mean gradient of 9 mmHg and no paravalvular leak.  His TEE in the operating room showed an ejection fraction of 45 to 50% pre-bypass with unchanged function post- bypass.  Current Outpatient Medications  Medication Sig Dispense Refill  . acetaminophen (TYLENOL) 500 MG tablet Take 1,000 mg by mouth every 6 (six) hours as needed for moderate pain or headache.    [name] apixaban (ELIQUIS) 5 MG TABS tablet Take 1 tablet (5 mg total) by mouth 2 (two) times daily. 180 tablet 3  . aspirin EC 81 MG EC tablet Take 1 tablet (81 mg total) by mouth daily. Swallow whole. 30 tablet 11  . atorvastatin (LIPITOR) 40 MG tablet Take 1 tablet (40 mg total) by mouth daily. 30 tablet 2  . buPROPion (WELLBUTRIN XL) 150 MG 24 hr tablet Take 1 tablet (150 mg total) by mouth daily. 30 tablet 1  . cetirizine (ZYRTEC) 10 MG tablet Take 10 mg by mouth 2 (two) times daily.     . citalopram (CELEXA) 40 MG tablet Take 40 mg by mouth every morning.    . Continuous Blood Gluc Receiver (FREESTYLE LIBRE 2 READER) DEVI Use to check glucose at least TID 1 each 1  . Continuous Blood Gluc Sensor (FREESTYLE LIBRE 2 SENSOR)  MISC Use to check glucose at least Q8H 2 each 11  . FARXIGA 5 MG TABS tablet TAKE 1 TABLET(5 MG) BY MOUTH DAILY BEFORE AND BREAKFAST 30 tablet 2  . fluticasone (FLONASE) 50 MCG/ACT nasal spray Place 2 sprays into both nostrils daily as needed for allergies or rhinitis.    [name] glucose blood test strip Use as instructed to check sugars BID. Dx e11.9 100 each 12  . lamoTRIgine (LAMICTAL) 150 MG tablet Take 1 tablet (150 mg total) by mouth daily. 30 tablet 0  . Lancets (ONETOUCH DELICA PLUS [id number]) MISC USE TWICE DAILY AS DIRECTED 100 each 7  . metFORMIN (GLUCOPHAGE) 1000 MG tablet TAKE 1 TABLET BY MOUTH TWICE DAILY (Patient taking differently: Take 1,000 mg by mouth 2 (two) times daily.) 120 tablet 1  . metoprolol succinate (TOPROL-XL) 50 MG 24 hr tablet Take 1 tablet (50 mg total) by mouth daily. 30 tablet 11  . NON FORMULARY as directed. CPAP    . QUEtiapine (SEROQUEL) 25 MG tablet Take 1 tablet (25 mg total) by mouth at bedtime. 30 tablet 0  . sacubitril-valsartan (ENTRESTO) 24-26 MG Take 1 tablet by mouth 2 (two) times daily. 60 tablet 11  . Semaglutide,0.25 or 0.5MG /DOS, (OZEMPIC, 0.25 OR 0.5 MG/DOSE,) 2 MG/1.5ML SOPN Inject 0.25 mg weekly for 4 weeks then increase to 0.5 mg weekly (Patient taking differently: Inject 0.5 mg as directed every  Saturday.) 4.5 mL 1  . spironolactone (ALDACTONE) 25 MG tablet Take 0.5 tablets (12.5 mg total) by mouth daily. 45 tablet 3   No current facility-administered medications for this visit.     Physical Exam: BP 113/77   Pulse 70   Resp 20   Ht 6\' 1"  (1.854 m)   Wt 247 lb (112 kg)   SpO2 97% Comment: RA  BMI 32.59 kg/m  He looks well. Cardiac exam shows an irregular rate and rhythm.  There is no murmur. Lungs are clear. There is no peripheral edema.  Diagnostic Tests:  CLINICAL DATA:  Follow-up aortic valve replacement  EXAM: CHEST - 2 VIEW  COMPARISON:  10/15/2020  FINDINGS: Previous median sternotomy, aortic valve replacement and  atrial clip. Minimal effusions in the posterior costophrenic angles are getting smaller. Mild atelectasis at the left lower lobe persists. The remainder of the chest is clear.  IMPRESSION: Minimal pleural effusions, getting smaller. Persistent mild atelectasis at the left base.   Electronically Signed   By: Nelson Chimes M.D.   On: 10/31/2020 10:39   Impression:  Overall I think he is making fairly good recovery following his surgery considering his not quite 1 month out.  He has an irregular rhythm today but his electrocardiogram on 10/29/2020 showed sinus rhythm at a rate of 59 with no significant change from his prior EKG.  He remains on Eliquis.  His EF does appear lower than it did in the operating room and that will have to be followed up on echocardiogram.  He does not have any signs of congestive heart failure.  He has had orthostatic hypotension and his medications are being adjusted by Dr. Tamala Julian.  I encouraged him to continue walking as much possible.  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  Plan:  I will see him back in 1 month for follow-up.   Gaye Pollack, MD [facility] [phone]

## 2020-11-02 ENCOUNTER — Encounter: Payer: Self-pay | Admitting: Surgery

## 2020-11-08 ENCOUNTER — Telehealth: Payer: Self-pay | Admitting: Interventional Cardiology

## 2020-11-08 NOTE — Telephone Encounter (Addendum)
Called and left patient a message to notify him of the $29 fee and authorization we will  Need to complete the forms. AO 11/08/20

## 2020-11-11 NOTE — Progress Notes (Signed)
Cardiology Office Note:    Date:  11/13/2020   ID:  Dalton [name], DOB 1966-06-10, MRN [id number]  PCP:  Einar Pheasant, MD  Cardiologist:  Sinclair Grooms, MD   Referring MD: Einar Pheasant, MD   Chief Complaint  Patient presents with  . Atrial Fibrillation  . Cardiac Valve Problem    History of Present Illness:    BRNADON [name] is a 55 y.o. male with a hx of biscupid aortic valve with AS/AR, prior SVT s/p RF ablation, HTN, HLD, AVR 09/2020, primary hypertension, hyperlipidemia, DM II, OSA on CPAP, and chronic combined systolic and diastolic HF EF 86% 11/7844.  He had a fall since last time I saw him.  He has tolerated the adjustments in medications made by me at the end of April.  He has had further medication adjustment by Dr. Cyndia Bent.  Beta-blocker therapy has been significantly decreased.  Heart rate is fast today.  Blood pressures improved compared to a week ago and 1 month ago.  Having significant anxiety concerning driving and being able to work.  He feels unsteady on his feet.  Has had 2 recent falls.  He does not feel dizzy.  He is not lost consciousness.  On occasion he feels there is difficulty initiating activity.  Right arm at times feels weak.  Past Medical History:  Diagnosis Date  . Aortic valve stenosis    a. Bicuspid AV - 2D Echo 06/2014 - mod AS, mild AI, mildly dilated aortic root.  . Arthritis    Neck  . CAD (coronary artery disease)   . Carotid artery disease ([place])    a. Mild by duplex 05/2015 - 1-39% BICA. Repeat due 05/2017.  . Depression   . Diabetes mellitus without complication ([place])   . Dilated aortic root ([place])    a. By echo 06/2014.  [name] Dyspnea    with exercion  . Dysrhythmia    Atrial Fibrilation  . Environmental allergies   . Headache    MIgraines- rare  . Heart murmur   . Hypercholesterolemia   . Hypertension   . Obesity (BMI 30-39.9) 04/26/2018  . PTSD (post-traumatic stress disorder)   . Sleep apnea    CPAP  . SVT  (supraventricular tachycardia) ([place])    a. h/o SVT - Ablation done 2013.    Past Surgical History:  Procedure Laterality Date  . AORTIC VALVE REPLACEMENT N/A 10/04/2020   Procedure: AORTIC VALVE REPLACEMENT (AVR);  Surgeon: Gaye Pollack, MD;  Location: [place];  Service: Open Heart Surgery;  Laterality: N/A;  . CARDIAC ELECTROPHYSIOLOGY STUDY AND ABLATION  08/21/2011  . COLONOSCOPY WITH PROPOFOL N/A 06/26/2017   Procedure: COLONOSCOPY WITH PROPOFOL;  Surgeon: Manya Silvas, MD;  Location[facility] ENDOSCOPY;  Service: Endoscopy;  Laterality: N/A;  . ESOPHAGOGASTRODUODENOSCOPY (EGD) WITH PROPOFOL N/A 06/26/2017   Procedure: ESOPHAGOGASTRODUODENOSCOPY (EGD) WITH PROPOFOL;  Surgeon: Manya Silvas, MD;  Location: [facility] ENDOSCOPY;  Service: Endoscopy;  Laterality: N/A;  . MAZE N/A 10/04/2020   Procedure: MAZE;  Surgeon: Gaye Pollack, MD;  Location: [place];  Service: Open Heart Surgery;  Laterality: N/A;  . RIGHT/LEFT HEART CATH AND CORONARY ANGIOGRAPHY N/A 11/16/2019   Procedure: RIGHT/LEFT HEART CATH AND CORONARY ANGIOGRAPHY;  Surgeon: [name], MD;  Location: [place] CV LAB;  Service: Cardiovascular;  Laterality: N/A;  . SEPTOPLASTY N/A 03/22/2015   Procedure: SEPTOPLASTY  WITH RIGHT INFERIOR TURBINATE REDUCTION;  Surgeon: Margaretha Sheffield, MD;  Location: [place];  Service:  ENT;  Laterality: N/A;  . SUPRAVENTRICULAR TACHYCARDIA ABLATION N/A 08/21/2011   Procedure: SUPRAVENTRICULAR TACHYCARDIA ABLATION;  Surgeon: Evans Lance, MD;  Location: [facility] CATH LAB;  Service: Cardiovascular;  Laterality: N/A;  . surgery for non descending testicle    . TEE WITHOUT CARDIOVERSION N/A 11/16/2019   Procedure: TRANSESOPHAGEAL ECHOCARDIOGRAM (TEE);  Surgeon: Geralynn Rile, MD;  Location: [place];  Service: Cardiovascular;  Laterality: N/A;  . TEE WITHOUT CARDIOVERSION N/A 10/04/2020   Procedure: TRANSESOPHAGEAL ECHOCARDIOGRAM (TEE);  Surgeon: Gaye Pollack, MD;  Location: [place];   Service: Open Heart Surgery;  Laterality: N/A;  . TONSILLECTOMY      Current Medications: Current Meds  Medication Sig  . acetaminophen (TYLENOL) 500 MG tablet Take 1,000 mg by mouth every 6 (six) hours as needed for moderate pain or headache.  [name] apixaban (ELIQUIS) 5 MG TABS tablet Take 1 tablet (5 mg total) by mouth 2 (two) times daily.  [name] aspirin EC 81 MG EC tablet Take 1 tablet (81 mg total) by mouth daily. Swallow whole.  [name] atorvastatin (LIPITOR) 40 MG tablet Take 1 tablet (40 mg total) by mouth daily.  [name] buPROPion (WELLBUTRIN XL) 150 MG 24 hr tablet Take 1 tablet (150 mg total) by mouth daily.  . cetirizine (ZYRTEC) 10 MG tablet Take 10 mg by mouth 2 (two) times daily.   . citalopram (CELEXA) 40 MG tablet Take 40 mg by mouth every morning.  . Continuous Blood Gluc Receiver (FREESTYLE LIBRE 2 READER) DEVI Use to check glucose at least TID  . Continuous Blood Gluc Sensor (FREESTYLE LIBRE 2 SENSOR) MISC Use to check glucose at least Q8H  . digoxin (LANOXIN) 0.125 MG tablet Take 0.5 tablets (0.0625 mg total) by mouth daily.  [name] FARXIGA 5 MG TABS tablet TAKE 1 TABLET(5 MG) BY MOUTH DAILY BEFORE AND BREAKFAST  . fluticasone (FLONASE) 50 MCG/ACT nasal spray Place 2 sprays into both nostrils daily as needed for allergies or rhinitis.  [name] glucose blood test strip Use as instructed to check sugars BID. Dx e11.9  . lamoTRIgine (LAMICTAL) 150 MG tablet Take 1 tablet (150 mg total) by mouth daily.  . Lancets (ONETOUCH DELICA PLUS [id number]) MISC USE TWICE DAILY AS DIRECTED  . metFORMIN (GLUCOPHAGE) 1000 MG tablet TAKE 1 TABLET BY MOUTH TWICE DAILY (Patient taking differently: Take 1,000 mg by mouth 2 (two) times daily.)  . metoprolol succinate (TOPROL-XL) 50 MG 24 hr tablet Take 25 mg by mouth daily. Take with or immediately following a meal.  . NON FORMULARY as directed. CPAP  . QUEtiapine (SEROQUEL) 25 MG tablet Take 1 tablet (25 mg total) by mouth at bedtime.  . sacubitril-valsartan (ENTRESTO)  24-26 MG Take 1 tablet by mouth 2 (two) times daily.  . Semaglutide,0.25 or 0.5MG/DOS, (OZEMPIC, 0.25 OR 0.5 MG/DOSE,) 2 MG/1.5ML SOPN Inject 0.25 mg weekly for 4 weeks then increase to 0.5 mg weekly (Patient taking differently: Inject 0.5 mg as directed every Saturday.)  . spironolactone (ALDACTONE) 25 MG tablet Take 0.5 tablets (12.5 mg total) by mouth daily.     Allergies:   Patient has no known allergies.   Social History   Socioeconomic History  . Marital status: Married    Spouse name: Not on file  . Number of children: 2  . Years of education: Not on file  . Highest education level: Not on file  Occupational History  . Occupation: truck Education administrator: Insurance account manager  Tobacco Use  . Smoking status: Former Smoker  Types: Cigarettes    Quit date: 11/04/1988    Years since quitting: 32.0  . Smokeless tobacco: Never Used  Vaping Use  . Vaping Use: Never used  Substance and Sexual Activity  . Alcohol use: Yes    Comment: occasionally  . Drug use: Not Currently    Comment: many years ago in highschool  . Sexual activity: Not Currently  Other Topics Concern  . Not on file  Social History Narrative   Very rare exercise   Social Determinants of Health   Financial Resource Strain: Low Risk   . Difficulty of Paying Living Expenses: Not hard at all  Food Insecurity: Not on file  Transportation Needs: Not on file  Physical Activity: Not on file  Stress: Not on file  Social Connections: Not on file     Family History: The patient's family history includes Aortic stenosis in his maternal grandfather; Breast cancer in his maternal grandmother; Cancer in his maternal grandfather and another family member; Diabetes in an other family member.  ROS:   Please see the history of present illness.    Has some anxiety/depression.  Has not yet able to return to work.  Did have a fall 1 week ago and banged the side of his head.  He did not notify us.  No specific neurological  complaints at this time or anything new.  Denies headache.  All other systems reviewed and are negative.  EKGs/Labs/Other Studies Reviewed:    The following studies were reviewed today: 2D Doppler echocardiogram May 9 , 2022: IMPRESSIONS    1. Left ventricular ejection fraction, by estimation, is 40 to 45%. The  left ventricle has mildly decreased function. The left ventricle  demonstrates global hypokinesis. There is mild left ventricular  hypertrophy. Left ventricular diastolic parameters  are consistent with Grade I diastolic dysfunction (impaired relaxation).  2. Right ventricular systolic function is normal. The right ventricular  size is normal.  3. The mitral valve is normal in structure. No evidence of mitral valve  regurgitation. No evidence of mitral stenosis.  4. The aortic valve has been repaired/replaced. Aortic valve  regurgitation is not visualized. No aortic stenosis is present. There is a  25 mm Edwards valve present in the aortic position. Procedure Date:  10/04/20. Echo findings are consistent with normal  structure and function of the aortic valve prosthesis. Aortic valve area,  by VTI measures 1.76 cm. Aortic valve mean gradient measures 11.8 mmHg.  Aortic valve Vmax measures 2.43 m/s.  5. The inferior vena cava is normal in size with greater than 50%  respiratory variability, suggesting right atrial pressure of 3 mmHg.   Comparison(s): No significant change from prior study. Prior images  reviewed side by side.   EKG:  EKG not repeated  Recent Labs: 10/02/2020: ALT 44 10/05/2020: Magnesium 2.5 10/07/2020: BUN 20; Creatinine, Ser 1.00; Hemoglobin 12.0; Platelets 89; Potassium 3.7; Sodium 133  Recent Lipid Panel    Component Value Date/Time   CHOL 137 04/26/2020 1133   TRIG 136.0 04/26/2020 1133   HDL 35.90 (L) 04/26/2020 1133   CHOLHDL 4 04/26/2020 1133   VLDL 27.2 04/26/2020 1133   LDLCALC 74 04/26/2020 1133   LDLDIRECT 228.0 05/14/2018 0951     Physical Exam:    VS:  BP 120/72   Pulse (!) 58   Ht 6' 1" (1.854 m)   Wt 248 lb (112.5 kg)   SpO2 97%   BMI 32.72 kg/m     Wt Readings from Last  3 Encounters:  11/13/20 248 lb (112.5 kg)  10/31/20 247 lb (112 kg)  10/29/20 248 lb (112.5 kg)     GEN: Overweight. No acute distress HEENT: Normal NECK: No JVD. LYMPHATICS: No lymphadenopathy CARDIAC: 1/6 systolic murmur.  Rapid IIRR no gallop, or edema. VASCULAR:  Normal Pulses. No bruits. RESPIRATORY:  Clear to auscultation without rales, wheezing or rhonchi  ABDOMEN: Soft, non-tender, non-distended, No pulsatile mass, MUSCULOSKELETAL: No deformity  SKIN: Warm and dry NEUROLOGIC:  Alert and oriented x 3 PSYCHIATRIC:  Normal affect   ASSESSMENT:    1. S/P AVR (aortic valve replacement)   2. Essential hypertension   3. Chronic combined systolic and diastolic HF (heart failure), NYHA class 2 (HCC)   4. Persistent atrial fibrillation ([place])   5. Hyperlipidemia, unspecified hyperlipidemia type   6. Chronic anticoagulation   7. Obstructive sleep apnea syndrome   8. Gait abnormality   9. Weakness    PLAN:    In order of problems listed above:  1. Status post aortic valve replacement.  Normally functioning valve.  See most updated echo above. 2. Blood pressure is better than on the last visit 3 weeks ago.  Current therapy. 3. According to the echo above, EF is increased to near preop baseline which was 40 to 45%.  There could be into observer variability in interpretation when compared to only.  We will continue Entresto low-dose, Toprol-XL and actually may consider switching to carvedilol.  Farxiga 5 mg/day, and Aldactone 25 mg p.o. daily.  Be met will be done today. 4. Digoxin 0.0625 mg/day to better control heart rate. 5. Continue high intensity statin therapy. 6. Continue Eliquis 5 mg twice daily. 7. CPAP is recommended. 8. Needs neurology evaluation to assess various complaints including gait abnormality, frequent  falls, weakness, and transient intermittent episodes of right arm weakness.  Clinical follow-up with me in 1 month.  No further titration of heart failure therapy.  Basic metabolic panel is ordered today.  He is definitely not ready to return to work.  He is not able to ambulate independently.  He has had several falls since surgery.  Medications have been adjusted to relieve relatively low blood pressures.  Neurology consultation will be obtained.   Medication Adjustments/Labs and Tests Ordered: Current medicines are reviewed at length with the patient today.  Concerns regarding medicines are outlined above.  Orders Placed This Encounter  Procedures  . Basic Metabolic Panel (BMET)  . Ambulatory referral to Neurology   Meds ordered this encounter  Medications  . digoxin (LANOXIN) 0.125 MG tablet    Sig: Take 0.5 tablets (0.0625 mg total) by mouth daily.    Dispense:  45 tablet    Refill:  3    Patient Instructions  Medication Instructions:  START Digoxin 0.0625 mg daily *If you need a refill on your cardiac medications before your next appointment, please call your pharmacy*   Lab Work: BMET today If you have labs (blood work) drawn today and your tests are completely normal, you will receive your results only by: [name] MyChart Message (if you have MyChart) OR . A paper copy in the mail If you have any lab test that is abnormal or we need to change your treatment, we will call you to review the results.   Testing/Procedures: none   Follow-Up: At [facility], you and your health needs are our priority.  As part of our continuing mission to provide you with exceptional heart care, we have created designated Provider Care  Teams.  These Care Teams include your primary Cardiologist (physician) and Advanced Practice Providers (APPs -  Physician Assistants and Nurse Practitioners) who all work together to provide you with the care you need, when you need it.  We recommend signing  up for the patient portal called "MyChart".  Sign up information is provided on this After Visit Summary.  MyChart is used to connect with patients for Virtual Visits (Telemedicine).  Patients are able to view lab/test results, encounter notes, upcoming appointments, etc.  Non-urgent messages can be sent to your provider as well.   To learn more about what you can do with MyChart, go to [email or web address].    Your next appointment:   2 month(s)  The format for your next appointment:   In Person  Provider:   You may see Sinclair Grooms, MD or one of the following Advanced Practice Providers on your designated Care Team:    Kathyrn Drown, NP    Other Instructions You have been referred to Neurology     Signed, Sinclair Grooms, MD  11/13/2020 1:29 PM    [place]

## 2020-11-12 ENCOUNTER — Other Ambulatory Visit: Payer: Self-pay

## 2020-11-12 ENCOUNTER — Ambulatory Visit (HOSPITAL_COMMUNITY): Payer: 59 | Attending: Cardiology

## 2020-11-12 DIAGNOSIS — Z952 Presence of prosthetic heart valve: Secondary | ICD-10-CM | POA: Insufficient documentation

## 2020-11-12 LAB — ECHOCARDIOGRAM COMPLETE
AR max vel: 1.72 cm2
AV Area VTI: 1.76 cm2
AV Area mean vel: 1.55 cm2
AV Mean grad: 11.8 mmHg
AV Peak grad: 23.5 mmHg
Ao pk vel: 2.43 m/s
S' Lateral: 3.9 cm

## 2020-11-12 MED ORDER — PERFLUTREN LIPID MICROSPHERE
1.0000 mL | INTRAVENOUS | Status: AC | PRN
  Administered 2020-11-12: 2 mL via INTRAVENOUS

## 2020-11-13 ENCOUNTER — Ambulatory Visit (INDEPENDENT_AMBULATORY_CARE_PROVIDER_SITE_OTHER): Payer: 59 | Admitting: Interventional Cardiology

## 2020-11-13 ENCOUNTER — Encounter: Payer: Self-pay | Admitting: *Deleted

## 2020-11-13 ENCOUNTER — Encounter: Payer: Self-pay | Admitting: Interventional Cardiology

## 2020-11-13 VITALS — BP 120/72 | HR 58 | Ht 73.0 in | Wt 248.0 lb

## 2020-11-13 DIAGNOSIS — Z952 Presence of prosthetic heart valve: Secondary | ICD-10-CM | POA: Diagnosis not present

## 2020-11-13 DIAGNOSIS — I4819 Other persistent atrial fibrillation: Secondary | ICD-10-CM | POA: Diagnosis not present

## 2020-11-13 DIAGNOSIS — Z7901 Long term (current) use of anticoagulants: Secondary | ICD-10-CM

## 2020-11-13 DIAGNOSIS — I5042 Chronic combined systolic (congestive) and diastolic (congestive) heart failure: Secondary | ICD-10-CM

## 2020-11-13 DIAGNOSIS — R531 Weakness: Secondary | ICD-10-CM

## 2020-11-13 DIAGNOSIS — I10 Essential (primary) hypertension: Secondary | ICD-10-CM | POA: Diagnosis not present

## 2020-11-13 DIAGNOSIS — R269 Unspecified abnormalities of gait and mobility: Secondary | ICD-10-CM

## 2020-11-13 DIAGNOSIS — G4733 Obstructive sleep apnea (adult) (pediatric): Secondary | ICD-10-CM

## 2020-11-13 DIAGNOSIS — E785 Hyperlipidemia, unspecified: Secondary | ICD-10-CM

## 2020-11-13 MED ORDER — DIGOXIN 125 MCG PO TABS: 0.0625 mg | ORAL_TABLET | Freq: Every day | ORAL | 3 refills | Status: DC

## 2020-11-13 NOTE — Patient Instructions (Signed)
Medication Instructions:  START Digoxin 0.0625 mg daily *If you need a refill on your cardiac medications before your next appointment, please call your pharmacy*   Lab Work: BMET today If you have labs (blood work) drawn today and your tests are completely normal, you will receive your results only by: [name] MyChart Message (if you have MyChart) OR . A paper copy in the mail If you have any lab test that is abnormal or we need to change your treatment, we will call you to review the results.   Testing/Procedures: none   Follow-Up: At [facility], you and your health needs are our priority.  As part of our continuing mission to provide you with exceptional heart care, we have created designated Provider Care Teams.  These Care Teams include your primary Cardiologist (physician) and Advanced Practice Providers (APPs -  Physician Assistants and Nurse Practitioners) who all work together to provide you with the care you need, when you need it.  We recommend signing up for the patient portal called "MyChart".  Sign up information is provided on this After Visit Summary.  MyChart is used to connect with patients for Virtual Visits (Telemedicine).  Patients are able to view lab/test results, encounter notes, upcoming appointments, etc.  Non-urgent messages can be sent to your provider as well.   To learn more about what you can do with MyChart, go to [email or web address].    Your next appointment:   2 month(s)  The format for your next appointment:   In Person  Provider:   You may see Sinclair Grooms, MD or one of the following Advanced Practice Providers on your designated Care Team:    Kathyrn Drown, NP    Other Instructions You have been referred to Neurology

## 2020-11-13 NOTE — Telephone Encounter (Signed)
Nurse Harlow Mares came to me about patient forms. Patient had made payment and signed authorization to have forms completed. Forms were given to nurse for completion. AO 11/13/20

## 2020-11-14 LAB — BASIC METABOLIC PANEL
BUN/Creatinine Ratio: 14 (ref 9–20)
BUN: 14 mg/dL (ref 6–24)
CO2: 22 mmol/L (ref 20–29)
Calcium: 9.7 mg/dL (ref 8.7–10.2)
Chloride: 100 mmol/L (ref 96–106)
Creatinine, Ser: 1.01 mg/dL (ref 0.76–1.27)
Glucose: 89 mg/dL (ref 65–99)
Potassium: 4.7 mmol/L (ref 3.5–5.2)
Sodium: 139 mmol/L (ref 134–144)
eGFR: 88 mL/min/{1.73_m2} (ref >59)

## 2020-11-20 ENCOUNTER — Other Ambulatory Visit: Payer: Self-pay | Admitting: *Deleted

## 2020-11-20 DIAGNOSIS — R269 Unspecified abnormalities of gait and mobility: Secondary | ICD-10-CM

## 2020-11-20 DIAGNOSIS — R531 Weakness: Secondary | ICD-10-CM

## 2020-11-26 ENCOUNTER — Telehealth: Payer: Self-pay | Admitting: Interventional Cardiology

## 2020-11-26 NOTE — Progress Notes (Addendum)
Virtual Visit via Video Note  I connected with Dalton [name] on 11/27/20 at  8:00 AM EDT by a video enabled telemedicine application and verified that I am speaking with the correct person using two identifiers.  Location: Patient: home Provider: office Persons participated in the visit- patient, provider   I discussed the limitations of evaluation and management by telemedicine and the availability of in person appointments. The patient expressed understanding and agreed to proceed.   I discussed the assessment and treatment plan with the patient. The patient was provided an opportunity to ask questions and all were answered. The patient agreed with the plan and demonstrated an understanding of the instructions.   The patient was advised to call back or seek an in-person evaluation if the symptoms worsen or if the condition fails to improve as anticipated.  I provided 20 minutes of non-face-to-face time during this encounter.   [name], MD    [facility] MD/PA/NP OP Progress Note  11/27/2020 8:42 AM Dalton [name]  MRN:  [id number]  Chief Complaint:  Chief Complaint    Trauma; Follow-up; Depression     HPI:  This is a follow-up appointment for PTSD and depression.  He presented late for the appointment.  He states that he had cardiac surgery on March 31.  He does not have good strengths, and he uses a cane for walking.  His daughter moved in to help him.  He feels useless, and he does not like to be dependent on others.  He feels burden to his family, although he used to be a provider.  He believes his mood worsened since he had her surgery.  He is planning to ask his surgeon whether he gets PT. He also agrees to try to work on chores he can do to help his family.  He does not recall whether bupropion has helped his mood.  He continues to feel irritable and gets angry easily.  He stays in the house most of the time due to his weakness/fatigue.  He has insomnia with restlessness.   He denies SI.  He does not recall dreams.  He denies flashback, stating that he does not go outside.    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 He was in [place] after 911, no combat experience Support: wife Household: [name]/his wife Marital status: married with his wife of six years Number of children: 2 daughters, age 63, 81, grandchild, 30 in march,   Visit Diagnosis:    ICD-10-CM   1. PTSD (post-traumatic stress disorder)  F43.10   2. Moderate episode of recurrent major depressive disorder ([place])  F33.1     Past Psychiatric History: Please see initial evaluation for full details. I have reviewed the history. No updates at this time.     Past Medical History:  Past Medical History:  Diagnosis Date  . Aortic valve stenosis    a. Bicuspid AV - 2D Echo 06/2014 - mod AS, mild AI, mildly dilated aortic root.  . Arthritis    Neck  . CAD (coronary artery disease)   . Carotid artery disease ([place])    a. Mild by duplex 05/2015 - 1-39% BICA. Repeat due 05/2017.  . Depression   .  Diabetes mellitus without complication ([place])   . Dilated aortic root ([place])    a. By echo 06/2014.  [name] Dyspnea    with exercion  . Dysrhythmia    Atrial Fibrilation  . Environmental allergies   . Headache    MIgraines- rare  . Heart murmur   . Hypercholesterolemia   . Hypertension   . Obesity (BMI 30-39.9) 04/26/2018  . PTSD (post-traumatic stress disorder)   . Sleep apnea    CPAP  . SVT (supraventricular tachycardia) ([place])    a. h/o SVT - Ablation done 2013.    Past Surgical History:  Procedure Laterality Date  . AORTIC VALVE REPLACEMENT N/A 10/04/2020   Procedure: AORTIC VALVE REPLACEMENT (AVR);  Surgeon: Gaye Pollack, MD;  Location: [place];  Service: Open Heart  Surgery;  Laterality: N/A;  . CARDIAC ELECTROPHYSIOLOGY STUDY AND ABLATION  08/21/2011  . COLONOSCOPY WITH PROPOFOL N/A 06/26/2017   Procedure: COLONOSCOPY WITH PROPOFOL;  Surgeon: Manya Silvas, MD;  Location: [facility] ENDOSCOPY;  Service: Endoscopy;  Laterality: N/A;  . ESOPHAGOGASTRODUODENOSCOPY (EGD) WITH PROPOFOL N/A 06/26/2017   Procedure: ESOPHAGOGASTRODUODENOSCOPY (EGD) WITH PROPOFOL;  Surgeon: Manya Silvas, MD;  Location: [facility] ENDOSCOPY;  Service: Endoscopy;  Laterality: N/A;  . MAZE N/A 10/04/2020   Procedure: MAZE;  Surgeon: Gaye Pollack, MD;  Location: [place];  Service: Open Heart Surgery;  Laterality: N/A;  . RIGHT/LEFT HEART CATH AND CORONARY ANGIOGRAPHY N/A 11/16/2019   Procedure: RIGHT/LEFT HEART CATH AND CORONARY ANGIOGRAPHY;  Surgeon: [name], MD;  Location: [place] CV LAB;  Service: Cardiovascular;  Laterality: N/A;  . SEPTOPLASTY N/A 03/22/2015   Procedure: SEPTOPLASTY  WITH RIGHT INFERIOR TURBINATE REDUCTION;  Surgeon: Margaretha Sheffield, MD;  Location: [place];  Service: ENT;  Laterality: N/A;  . SUPRAVENTRICULAR TACHYCARDIA ABLATION N/A 08/21/2011   Procedure: SUPRAVENTRICULAR TACHYCARDIA ABLATION;  Surgeon: Evans Lance, MD;  Location: [facility] CATH LAB;  Service: Cardiovascular;  Laterality: N/A;  . surgery for non descending testicle    . TEE WITHOUT CARDIOVERSION N/A 11/16/2019   Procedure: TRANSESOPHAGEAL ECHOCARDIOGRAM (TEE);  Surgeon: Geralynn Rile, MD;  Location: [place];  Service: Cardiovascular;  Laterality: N/A;  . TEE WITHOUT CARDIOVERSION N/A 10/04/2020   Procedure: TRANSESOPHAGEAL ECHOCARDIOGRAM (TEE);  Surgeon: Gaye Pollack, MD;  Location: [place];  Service: Open Heart Surgery;  Laterality: N/A;  . TONSILLECTOMY      Family Psychiatric History: Please see initial evaluation for full details. I have reviewed the history. No updates at this time.     Family History:  Family History  Problem Relation Age of Onset  . Diabetes  Other   . Cancer Other   . Breast cancer Maternal Grandmother   . Cancer Maternal Grandfather   . Aortic stenosis Maternal Grandfather     Social History:  Social History   Socioeconomic History  . Marital status: Married    Spouse name: Not on file  . Number of children: 2  . Years of education: Not on file  . Highest education level: Not on file  Occupational History  . Occupation: truck Education administrator: Insurance account manager  Tobacco Use  . Smoking status: Former Smoker    Types: Cigarettes    Quit date: 11/04/1988    Years since quitting: 32.0  . Smokeless tobacco: Never Used  Vaping Use  . Vaping Use: Never used  Substance and Sexual Activity  . Alcohol use: Yes    Comment: occasionally  . Drug use:  Not Currently    Comment: many years ago in highschool  . Sexual activity: Not Currently  Other Topics Concern  . Not on file  Social History Narrative   Very rare exercise   Social Determinants of Health   Financial Resource Strain: Low Risk   . Difficulty of Paying Living Expenses: Not hard at all  Food Insecurity: Not on file  Transportation Needs: Not on file  Physical Activity: Not on file  Stress: Not on file  Social Connections: Not on file    Allergies: No Known Allergies  Metabolic Disorder Labs: Lab Results  Component Value Date   HGBA1C 6.1 (H) 09/20/2020   MPG 128.37 09/20/2020   No results found for: PROLACTIN Lab Results  Component Value Date   CHOL 137 04/26/2020   TRIG 136.0 04/26/2020   HDL 35.90 (L) 04/26/2020   CHOLHDL 4 04/26/2020   VLDL 27.2 04/26/2020   LDLCALC 74 04/26/2020   LDLCALC 84 12/26/2019   Lab Results  Component Value Date   TSH 1.22 06/09/2019   TSH 1.56 05/14/2018    Therapeutic Level Labs: No results found for: LITHIUM No results found for: VALPROATE No components found for:  CBMZ  Current Medications: Current Outpatient Medications  Medication Sig Dispense Refill  . acetaminophen (TYLENOL) 500 MG tablet  Take 1,000 mg by mouth every 6 (six) hours as needed for moderate pain or headache.    [name] apixaban (ELIQUIS) 5 MG TABS tablet Take 1 tablet (5 mg total) by mouth 2 (two) times daily. 180 tablet 3  . aspirin EC 81 MG EC tablet Take 1 tablet (81 mg total) by mouth daily. Swallow whole. 30 tablet 11  . atorvastatin (LIPITOR) 40 MG tablet Take 1 tablet (40 mg total) by mouth daily. 30 tablet 2  . buPROPion (WELLBUTRIN XL) 150 MG 24 hr tablet Take 1 tablet (150 mg total) by mouth daily. 30 tablet 1  . cetirizine (ZYRTEC) 10 MG tablet Take 10 mg by mouth 2 (two) times daily.     . citalopram (CELEXA) 40 MG tablet Take 40 mg by mouth every morning.    . Continuous Blood Gluc Receiver (FREESTYLE LIBRE 2 READER) DEVI Use to check glucose at least TID 1 each 1  . Continuous Blood Gluc Sensor (FREESTYLE LIBRE 2 SENSOR) MISC Use to check glucose at least Q8H 2 each 11  . digoxin (LANOXIN) 0.125 MG tablet Take 0.5 tablets (0.0625 mg total) by mouth daily. 45 tablet 3  . FARXIGA 5 MG TABS tablet TAKE 1 TABLET(5 MG) BY MOUTH DAILY BEFORE AND BREAKFAST 30 tablet 2  . fluticasone (FLONASE) 50 MCG/ACT nasal spray Place 2 sprays into both nostrils daily as needed for allergies or rhinitis.    [name] glucose blood test strip Use as instructed to check sugars BID. Dx e11.9 100 each 12  . lamoTRIgine (LAMICTAL) 150 MG tablet Take 1 tablet (150 mg total) by mouth daily. 30 tablet 0  . Lancets (ONETOUCH DELICA PLUS [id number]) MISC USE TWICE DAILY AS DIRECTED 100 each 7  . metFORMIN (GLUCOPHAGE) 1000 MG tablet TAKE 1 TABLET BY MOUTH TWICE DAILY (Patient taking differently: Take 1,000 mg by mouth 2 (two) times daily.) 120 tablet 1  . metoprolol succinate (TOPROL-XL) 50 MG 24 hr tablet Take 25 mg by mouth daily. Take with or immediately following a meal.    . NON FORMULARY as directed. CPAP    . QUEtiapine (SEROQUEL) 25 MG tablet Take 1 tablet (25 mg total) by mouth at  bedtime. 30 tablet 0  . sacubitril-valsartan (ENTRESTO) 24-26  MG Take 1 tablet by mouth 2 (two) times daily. 60 tablet 11  . Semaglutide,0.25 or 0.5MG /DOS, (OZEMPIC, 0.25 OR 0.5 MG/DOSE,) 2 MG/1.5ML SOPN Inject 0.25 mg weekly for 4 weeks then increase to 0.5 mg weekly (Patient taking differently: Inject 0.5 mg as directed every Saturday.) 4.5 mL 1  . spironolactone (ALDACTONE) 25 MG tablet Take 0.5 tablets (12.5 mg total) by mouth daily. 45 tablet 3   No current facility-administered medications for this visit.     Musculoskeletal: Strength & Muscle Tone: N/A Gait & Station: N/A Patient leans: N/A  Psychiatric Specialty Exam: Review of Systems  Psychiatric/Behavioral: Positive for dysphoric mood and sleep disturbance. Negative for agitation, behavioral problems, confusion, decreased concentration, hallucinations, self-injury and suicidal ideas. The patient is not nervous/anxious and is not hyperactive.   All other systems reviewed and are negative.   There were no vitals taken for this visit.There is no height or weight on file to calculate BMI.  General Appearance: Fairly Groomed  Eye Contact:  Good  Speech:  Clear and Coherent  Volume:  Normal  Mood:  Depressed  Affect:  Appropriate, Congruent and down  Thought Process:  Coherent  Orientation:  Full (Time, Place, and Person)  Thought Content: Logical   Suicidal Thoughts:  No  Homicidal Thoughts:  No  Memory:  Immediate;   Good  Judgement:  Good  Insight:  Good  Psychomotor Activity:  Normal  Concentration:  Concentration: Good and Attention Span: Good  Recall:  Good  Fund of Knowledge: Good  Language: Good  Akathisia:  No  Handed:  Right  AIMS (if indicated): not done  Assets:  Communication Skills Desire for Improvement  ADL's:  Intact  Cognition: WNL  Sleep:  Poor   Screenings: GAD-7   Health and safety inspector from 07/27/2020 in [place]  Total GAD-7 Score 13    PHQ2-9   Flowsheet Row Counselor from 07/27/2020 in [place] Visit from 03/29/2019 in [place] Office Visit from 07/05/2018 in [place] from 06/20/2016 in [place] Office Visit from 05/05/2016 in [place]  PHQ-2 Total Score 3 0 0 1 0  PHQ-9 Total Score 12 0 4 -- --    Flowsheet Row Admission (Discharged) from 10/04/2020 in [facility] Testing 60 from 10/02/2020 in [facility] PREADMISSION TESTING Pre-Admission Testing 60 from 09/20/2020 in [facility] PREADMISSION TESTING  C-SSRS RISK CATEGORY No Risk No Risk No Risk       Assessment and Plan:  Dalton [name] is a 55 y.o. 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   1. PTSD (post-traumatic stress disorder) 2. Moderate episode of recurrent major depressive disorder ([place]) He continues to report irritability, and there has been slight worsening in depressive symptoms in the context of demoralization status post cardiac surgery over the past few months. Other psychosocial stressors includes work, history of being in [employer], and being denied of service connection by [place]. He agrees to continue the current medication at this time with the hope that his mood improves as he gains more strengths/he sees  a therapist in June.  Will continue citalopram to target PTSD and depression and PTSD.  Will continue lamotrigine for mood dysregulation.  Will continue quetiapine for mood dysregulation and as adjunctive treatment for depression and PTSD.  Will continue bupropion to target depression. Noted that there might have been some  medication change by his cardiac surgeon; he will contact the office if any change in his medication/if he needs any refill.   # Parasomnia He reports a history of parasomnia.  Per chart review, he did have a home sleep study in last year, and was recommended for further evaluation.  He was advised to contact his provider.   Plan I have reviewed and updated plans as below (He will contact the clinic if he needs any medication refills) 1. Continue Citalopram 40 mg daily 2. Continue bupropion 150 mg daily 3. Continue lamotrigine 150 mg daily 4. Continue quetiapine 25 mg at night - monitor drowsiness 5. Please contact your provider for your sleep issues/further evaluation  6. Next appointment: 6/30 at 2:30 for 30 mins, video  Past trials of medication: citalopram, lamotrigine, quetiapine, Trazodone,   The patient demonstrates the following risk factors for suicide: Chronic risk factors for suicide include: psychiatric disorder of depression, PTSD. Acute risk factors for suicide include: N/A. Protective factors for this patient include: positive social support, responsibility to others (children, family), coping skills and hope for the future. Considering these factors, the overall suicide risk at this point appears to be low. Patient is appropriate for outpatient follow up.  [name], MD 11/27/2020, 8:42 AM

## 2020-11-26 NOTE — Telephone Encounter (Signed)
HIM received forms back. Called back and spoke to patient to inform him that his forms were ready for pick up as requested. Patient also requested forms to be faxed to [facility] during the call. HIM received verbal authorization to fax forms and requested medical records to [facility]. Form and a letter were faxed to [facility] as requested by patient. Forms were also put in an envelope and left at check-in for patient to pick up. AO 11/26/20

## 2020-11-26 NOTE — Telephone Encounter (Signed)
Nurse Anderson Malta indicated the other forms which were FMLA forms, were not needed only the disability forms. Disability forms were completed and given to HIM. AO 11/26/20

## 2020-11-27 ENCOUNTER — Encounter: Payer: Self-pay | Admitting: Psychiatry

## 2020-11-27 ENCOUNTER — Other Ambulatory Visit: Payer: Self-pay

## 2020-11-27 ENCOUNTER — Telehealth (INDEPENDENT_AMBULATORY_CARE_PROVIDER_SITE_OTHER): Payer: 59 | Admitting: Psychiatry

## 2020-11-27 DIAGNOSIS — F4310 Post-traumatic stress disorder, unspecified: Secondary | ICD-10-CM | POA: Diagnosis not present

## 2020-11-27 DIAGNOSIS — F331 Major depressive disorder, recurrent, moderate: Secondary | ICD-10-CM

## 2020-11-27 NOTE — Patient Instructions (Signed)
1. Continue Citalopram 40 mg daily 2. Continue bupropion 150 mg daily 3. Continue lamotrigine 150 mg daily 4. Continue quetiapine 25 mg at night 5. Please contact your provider for your sleep issues/further evaluation  6. Next appointment: 6/30 at 2:30

## 2020-11-28 ENCOUNTER — Ambulatory Visit (INDEPENDENT_AMBULATORY_CARE_PROVIDER_SITE_OTHER): Payer: 59 | Admitting: Neurology

## 2020-11-28 ENCOUNTER — Other Ambulatory Visit: Payer: Self-pay

## 2020-11-28 ENCOUNTER — Ambulatory Visit (INDEPENDENT_AMBULATORY_CARE_PROVIDER_SITE_OTHER): Payer: Self-pay | Admitting: Surgery

## 2020-11-28 ENCOUNTER — Encounter: Payer: Self-pay | Admitting: Neurology

## 2020-11-28 ENCOUNTER — Encounter: Payer: Self-pay | Admitting: Surgery

## 2020-11-28 VITALS — BP 120/78 | HR 53 | Resp 20 | Ht 73.0 in | Wt 251.0 lb

## 2020-11-28 VITALS — BP 108/76 | HR 87 | Ht 73.0 in | Wt 253.0 lb

## 2020-11-28 DIAGNOSIS — I69398 Other sequelae of cerebral infarction: Secondary | ICD-10-CM

## 2020-11-28 DIAGNOSIS — Z952 Presence of prosthetic heart valve: Secondary | ICD-10-CM

## 2020-11-28 DIAGNOSIS — I350 Nonrheumatic aortic (valve) stenosis: Secondary | ICD-10-CM

## 2020-11-28 DIAGNOSIS — R42 Dizziness and giddiness: Secondary | ICD-10-CM

## 2020-11-28 DIAGNOSIS — R4701 Aphasia: Secondary | ICD-10-CM

## 2020-11-28 DIAGNOSIS — I4891 Unspecified atrial fibrillation: Secondary | ICD-10-CM

## 2020-11-28 DIAGNOSIS — R296 Repeated falls: Secondary | ICD-10-CM | POA: Diagnosis not present

## 2020-11-28 DIAGNOSIS — I480 Paroxysmal atrial fibrillation: Secondary | ICD-10-CM
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
## 2020-11-28 NOTE — Patient Instructions (Signed)
MRI /MRA Brain for this non pacemaker patient , rule out watershed or CVA.   2)  He has not been enrolled in Physical therapy or dizziness/ vertigo therapy.  3)  MMSE/ MOCA with next visit, orthostatic BP as well.   Not followed at [facility] sleep clinic. Dr Radford Pax.

## 2020-11-29 ENCOUNTER — Telehealth: Payer: Self-pay | Admitting: Neurology

## 2020-11-29 NOTE — Telephone Encounter (Signed)
MR brain w/wo contrast uhc auth: [id number] (11/29/20-01/13/21) & MRA head wo contrast uhc auth: [id number] (11/29/20-01/13/21)  Sent to GI for scheduling

## 2020-11-30 NOTE — Progress Notes (Signed)
HPI:  Mr. Dalton [name] returns today for follow-up status post aortic valve replacement with a 25 mm Edwards pericardial valve, biatrial Maze procedure and clipping of left atrial appendage on 10/04/2020.  He has had a slow postoperative recovery with fatigue and some orthostatic dizziness as well as decreased appetite.  He reports having a few falls due to balance problems.  He has been walking without chest pain or shortness of breath but said that he still gets tired easily.  Current Outpatient Medications  Medication Sig Dispense Refill  . acetaminophen (TYLENOL) 500 MG tablet Take 1,000 mg by mouth every 6 (six) hours as needed for moderate pain or headache.    [name] apixaban (ELIQUIS) 5 MG TABS tablet Take 1 tablet (5 mg total) by mouth 2 (two) times daily. 180 tablet 3  . aspirin EC 81 MG EC tablet Take 1 tablet (81 mg total) by mouth daily. Swallow whole. 30 tablet 11  . atorvastatin (LIPITOR) 40 MG tablet Take 1 tablet (40 mg total) by mouth daily. 30 tablet 2  . buPROPion (WELLBUTRIN XL) 150 MG 24 hr tablet Take 1 tablet (150 mg total) by mouth daily. 30 tablet 1  . cetirizine (ZYRTEC) 10 MG tablet Take 10 mg by mouth 2 (two) times daily.     . citalopram (CELEXA) 40 MG tablet Take 40 mg by mouth every morning.    . Continuous Blood Gluc Receiver (FREESTYLE LIBRE 2 READER) DEVI Use to check glucose at least TID 1 each 1  . Continuous Blood Gluc Sensor (FREESTYLE LIBRE 2 SENSOR) MISC Use to check glucose at least Q8H 2 each 11  . digoxin (LANOXIN) 0.125 MG tablet Take 0.5 tablets (0.0625 mg total) by mouth daily. 45 tablet 3  . FARXIGA 5 MG TABS tablet TAKE 1 TABLET(5 MG) BY MOUTH DAILY BEFORE AND BREAKFAST 30 tablet 2  . fluticasone (FLONASE) 50 MCG/ACT nasal spray Place 2 sprays into both nostrils daily as needed for allergies or rhinitis.    [name] glucose blood test strip Use as instructed to check sugars BID. Dx e11.9 100 each 12  . lamoTRIgine (LAMICTAL) 150 MG tablet Take 1 tablet (150 mg  total) by mouth daily. 30 tablet 0  . Lancets (ONETOUCH DELICA PLUS [id number]) MISC USE TWICE DAILY AS DIRECTED 100 each 7  . metFORMIN (GLUCOPHAGE) 1000 MG tablet TAKE 1 TABLET BY MOUTH TWICE DAILY (Patient taking differently: Take 1,000 mg by mouth 2 (two) times daily.) 120 tablet 1  . metoprolol succinate (TOPROL-XL) 50 MG 24 hr tablet Take 25 mg by mouth daily. Take with or immediately following a meal.    . NON FORMULARY as directed. CPAP    . QUEtiapine (SEROQUEL) 25 MG tablet Take 1 tablet (25 mg total) by mouth at bedtime. 30 tablet 0  . sacubitril-valsartan (ENTRESTO) 24-26 MG Take 1 tablet by mouth 2 (two) times daily. 60 tablet 11  . Semaglutide,0.25 or 0.5MG /DOS, (OZEMPIC, 0.25 OR 0.5 MG/DOSE,) 2 MG/1.5ML SOPN Inject 0.25 mg weekly for 4 weeks then increase to 0.5 mg weekly (Patient taking differently: Inject 0.5 mg as directed every Saturday.) 4.5 mL 1  . spironolactone (ALDACTONE) 25 MG tablet Take 0.5 tablets (12.5 mg total) by mouth daily. 45 tablet 3   No current facility-administered medications for this visit.     Physical Exam: BP 120/78   Pulse (!) 53   Resp 20   Ht 6\' 1"  (1.854 m)   Wt 251 lb (113.9 kg)   SpO2 98%  Comment: RA  BMI 33.12 kg/m  He looks well. Cardiac exam shows a regular rate and rhythm with occasional extra beats.  There is no murmur. Lungs are clear. The chest incision is well-healed and the sternum is stable. There is no peripheral edema.  Diagnostic Tests:  ECHOCARDIOGRAM REPORT       Patient Name:  Dalton [name] Sweetman Date of Exam: 11/12/2020  Medical Rec #: [id number]     Height:    73.0 in  Accession #:  [id number]    Weight:    247.0 lb  Date of Birth: May 05, 1966     BSA:     2.354 m  Patient Age:  55 years     BP:      113/77 mmHg  Patient Gender: M         HR:      110 bpm.  Exam Location: [facility]   Procedure: 2D Echo, Cardiac Doppler, Color Doppler and Intracardiac        Opacification Agent   Indications:  I35.9 Aortic valve disorder    History:    Patient has prior history of Echocardiogram examinations,  most         recent 10/17/2020. CHF, Arrythmias:Atrial Fibrillation;  Risk         Factors:Hypertension, Diabetes and Dyslipidemia. Aortic  valve         stenosis. S/P AVR. Obesity. Chronic anticoagulation. Sleep         apnea.         Aortic Valve: 25 mm Edwards valve is present in the aortic         position. Procedure Date: 10/04/20.    Sonographer:  Diamond Nickel RCS  Referring Phys: [place]    1. Left ventricular ejection fraction, by estimation, is 40 to 45%. The  left ventricle has mildly decreased function. The left ventricle  demonstrates global hypokinesis. There is mild left ventricular  hypertrophy. Left ventricular diastolic parameters  are consistent with Grade I diastolic dysfunction (impaired relaxation).  2. Right ventricular systolic function is normal. The right ventricular  size is normal.  3. The mitral valve is normal in structure. No evidence of mitral valve  regurgitation. No evidence of mitral stenosis.  4. The aortic valve has been repaired/replaced. Aortic valve  regurgitation is not visualized. No aortic stenosis is present. There is a  25 mm Edwards valve present in the aortic position. Procedure Date:  10/04/20. Echo findings are consistent with normal  structure and function of the aortic valve prosthesis. Aortic valve area,  by VTI measures 1.76 cm. Aortic valve mean gradient measures 11.8 mmHg.  Aortic valve Vmax measures 2.43 m/s.  5. The inferior vena cava is normal in size with greater than 50%  respiratory variability, suggesting right atrial pressure of 3 mmHg.   Comparison(s): No significant change from prior study. Prior images  reviewed side by side.   FINDINGS  Left Ventricle: Left ventricular  ejection fraction, by estimation, is 40  to 45%. The left ventricle has mildly decreased function. The left  ventricle demonstrates global hypokinesis. The left ventricular internal  cavity size was normal in size. There is  mild left ventricular hypertrophy. Left ventricular diastolic parameters  are consistent with Grade I diastolic dysfunction (impaired relaxation).   Right Ventricle: The right ventricular size is normal. No increase in  right ventricular wall thickness. Right ventricular systolic function is  normal.   Left Atrium: Left atrial size was  normal in size.   Right Atrium: Right atrial size was normal in size.   Pericardium: There is no evidence of pericardial effusion.   Mitral Valve: The mitral valve is normal in structure. No evidence of  mitral valve regurgitation. No evidence of mitral valve stenosis.   Tricuspid Valve: The tricuspid valve is normal in structure. Tricuspid  valve regurgitation is not demonstrated. No evidence of tricuspid  stenosis.   Aortic Valve: The aortic valve has been repaired/replaced. Aortic valve  regurgitation is not visualized. No aortic stenosis is present. Aortic  valve mean gradient measures 11.8 mmHg. Aortic valve peak gradient  measures 23.5 mmHg. Aortic valve area, by  VTI measures 1.76 cm. There is a 25 mm Edwards valve present in the  aortic position. Procedure Date: 10/04/20. Echo findings are consistent  with normal structure and function of the aortic valve prosthesis.   Pulmonic Valve: The pulmonic valve was normal in structure. Pulmonic valve  regurgitation is not visualized. No evidence of pulmonic stenosis.   Aorta: The aortic root is normal in size and structure.   Venous: The inferior vena cava is normal in size with greater than 50%  respiratory variability, suggesting right atrial pressure of 3 mmHg.   IAS/Shunts: No atrial level shunt detected by color flow Doppler.     LEFT VENTRICLE  PLAX 2D  LVIDd:      4.90 cm  LVIDs:     3.90 cm  LV PW:     1.20 cm  LV IVS:    1.20 cm  LVOT diam:   2.10 cm  LV SV:     65  LV SV Index:  28  LVOT Area:   3.46 cm     RIGHT VENTRICLE  RV Basal diam: 2.60 cm   LEFT ATRIUM       Index    RIGHT ATRIUM      Index  LA diam:    3.90 cm 1.66 cm/m RA Area:   18.40 cm  LA Vol (A2C):  55.3 ml 23.50 ml/m RA Volume:  50.80 ml 21.58 ml/m  LA Vol (A4C):  53.8 ml 22.86 ml/m  LA Biplane Vol: 54.7 ml 23.24 ml/m  AORTIC VALVE  AV Area (Vmax):  1.72 cm  AV Area (Vmean):  1.55 cm  AV Area (VTI):   1.76 cm  AV Vmax:      242.60 cm/s  AV Vmean:     152.400 cm/s  AV VTI:      0.368 m  AV Peak Grad:   23.5 mmHg  AV Mean Grad:   11.8 mmHg  LVOT Vmax:     120.60 cm/s  LVOT Vmean:    68.033 cm/s  LVOT VTI:     0.187 m  LVOT/AV VTI ratio: 0.51     SHUNTS  Systemic VTI: 0.19 m  Systemic Diam: 2.10 cm   Candee Furbish MD  Electronically signed by Candee Furbish MD  Signature Date/Time: 11/12/2020/2:21:17 PM     Impression:  Overall he is making a slow but steady recovery following his surgery.  A follow-up postoperative echo showed a normally functioning aortic valve prosthesis with low mean gradient.  Left ventricular ejection fraction was 45% which is essentially unchanged from his preoperative TEE.  He has having some problems with his balance and has fallen several times.  He does not feel like he is mentally back to 100%.  He is seeing neurology later today.  I encouraged him to continue walking as much  as possible to build up his stamina.  He is now about 2 months out from surgery.  I would expect him to continue improving.  Plan:  He has an appointment with neurology later today.  I will plan to see him back in 1 month for follow-up.   Gaye Pollack, MD [facility] [phone]

## 2020-12-04 NOTE — Telephone Encounter (Signed)
Patient left a voicemail returning a call from our office.

## 2020-12-10 ENCOUNTER — Ambulatory Visit: Payer: 59 | Admitting: Pharmacist

## 2020-12-10 DIAGNOSIS — I10 Essential (primary) hypertension: Secondary | ICD-10-CM

## 2020-12-10 DIAGNOSIS — E1165 Type 2 diabetes mellitus with hyperglycemia: Secondary | ICD-10-CM

## 2020-12-10 DIAGNOSIS — I5042 Chronic combined systolic (congestive) and diastolic (congestive) heart failure: Secondary | ICD-10-CM

## 2020-12-10 DIAGNOSIS — E785 Hyperlipidemia, unspecified: Secondary | ICD-10-CM

## 2020-12-10 NOTE — Patient Instructions (Signed)
  Visit Information  Goals Addressed              This Visit's Progress   .  Medication Monitoring (pt-stated)        Patient Goals/Self-Care Activities . Over the next days, patient will:  - take medications as prescribed check glucose at least twice daily, document, and provide at future appointments         Patient verbalizes understanding of instructions provided today and agrees to view in [place].   Telephone follow up appointment with care management team member scheduled for: ~7 weeks  Lorel Monaco, PharmD, [place]

## 2020-12-10 NOTE — Chronic Care Management (AMB) (Addendum)
Care Management   Pharmacy Note  12/10/2020 Name: Dalton [name] MRN: [id number] DOB: 1966/05/09  Subjective: Dalton [name] is a 55 y.o. year old male who is a primary care [name] of Einar Pheasant, MD. The Care Management team was consulted for assistance with care management and care coordination needs.    Engaged with [name] by telephone for follow up visit in response to provider referral for pharmacy case management and/or care coordination services.   The [name] was given information about Care Management services today including:  1. Care Management services includes personalized support from designated clinical staff supervised by the [name]'s primary care provider, including individualized plan of care and coordination with other care providers. 2. 24/7 contact phone numbers for assistance for urgent and routine care needs. 3. The [name] may stop case management services at any time by phone call to the office staff.  [name] agreed to services and consent obtained.  Assessment:  Review of [name] status, including review of consultants reports, laboratory and other test data, was performed as part of comprehensive evaluation and provision of chronic care management services.   SDOH (Social Determinants of Health) assessments and interventions performed:  SDOH Interventions   Flowsheet Row Most Recent Value  SDOH Interventions   Financial Strain Interventions Intervention Not Indicated       Objective:  Lab Results  Component Value Date   CREATININE 1.01 11/13/2020   CREATININE 1.00 10/07/2020   CREATININE 1.39 (H) 10/06/2020    Lab Results  Component Value Date   HGBA1C 6.1 (H) 09/20/2020       Component Value Date/Time   CHOL 137 04/26/2020 1133   TRIG 136.0 04/26/2020 1133   HDL 35.90 (L) 04/26/2020 1133   CHOLHDL 4 04/26/2020 1133   VLDL 27.2 04/26/2020 1133   [place] 74 04/26/2020 1133   LDLDIRECT 228.0 05/14/2018 0951   Clinical ASCVD:  Yes  The 10-year ASCVD risk score Dalton Bussing DC Jr., et al., 2013) is: 8%   Values used to calculate the score:     Age: 63 years     Sex: Male     Is Non-Hispanic African American: No     Diabetic: Yes     Tobacco smoker: No     Systolic Blood Pressure: 878 mmHg     Is BP treated: Yes     HDL Cholesterol: 35.9 mg/dL     Total Cholesterol: 137 mg/dL    BP Readings from Last 3 Encounters:  11/28/20 108/76  11/28/20 120/78  11/13/20 120/72    Care Plan  No Known Allergies  Medications Reviewed Today    Reviewed by Avie Arenas, RPH (Pharmacist) on 12/10/20 at 1122  Med List Status: <None>  Medication Order Taking? Sig Documenting Provider Last Dose Status Informant  acetaminophen (TYLENOL) 500 MG tablet [id number] No Take 1,000 mg by mouth every 6 (six) hours as needed for moderate pain or headache.  [name] not taking: Reported on 12/10/2020   [provider] Not Taking Active Self  apixaban (ELIQUIS) 5 MG TABS tablet [id number] Yes Take 1 tablet (5 mg total) by mouth 2 (two) times daily. [name], MD Taking Active Self  aspirin EC 81 MG EC tablet [id number] Yes Take 1 tablet (81 mg total) by mouth daily. Swallow whole. Antony Odea, PA-C Taking Active   atorvastatin (LIPITOR) 40 MG tablet [id number] Yes Take 1 tablet (40 mg total) by mouth daily. Einar Pheasant, MD Taking Active Self  buPROPion (WELLBUTRIN XL) 150  MG 24 hr tablet [id number] Yes Take 1 tablet (150 mg total) by mouth daily. Cloria Spring, MD Taking Active   cetirizine (ZYRTEC) 10 MG tablet [id number] Yes Take 10 mg by mouth 2 (two) times daily.  [provider] Taking Active Self           Med Note [name], [name]   Tue Aug 10, 2018  8:35 PM)    citalopram (CELEXA) 40 MG tablet [id number] Yes Take 40 mg by mouth every morning. [provider] Taking Active Self  Continuous Blood Gluc Receiver (FREESTYLE LIBRE 2 READER) DEVI [id number]  Use to check glucose at least TID Einar Pheasant,  MD  Active   Continuous Blood Gluc Sensor (FREESTYLE LIBRE 2 SENSOR) [place] [id number]  Use to check glucose at least [facility], Dalton Patient, MD  Active   digoxin (LANOXIN) 0.125 MG tablet [id number] Yes Take 0.5 tablets (0.0625 mg total) by mouth daily. [name], MD Taking Active   FARXIGA 5 MG TABS tablet [id number] Yes TAKE 1 TABLET(5 MG) BY MOUTH DAILY BEFORE AND [place] Norman, MD Taking Active   fluticasone (FLONASE) 50 MCG/ACT nasal spray [id number] No Place 2 sprays into both nostrils daily as needed for allergies or rhinitis.  [name] not taking: Reported on 12/10/2020   [provider] Not Taking Active Self  glucose blood test strip [id number]  Use as instructed to check sugars BID. Dx e11.9 Einar Pheasant, MD  Active Self  lamoTRIgine (LAMICTAL) 150 MG tablet [id number] Yes Take 1 tablet (150 mg total) by mouth daily. [name], MD Taking Active Self  Lancets (ONETOUCH DELICA PLUS [id number]) [place] [id number]  USE TWICE DAILY AS DIRECTED Einar Pheasant, MD  Active Self  metFORMIN (GLUCOPHAGE) 1000 MG tablet [id number] Yes TAKE 1 TABLET BY MOUTH TWICE DAILY  [name] taking differently: Take 1,000 mg by mouth 2 (two) times daily.   Einar Pheasant, MD Taking Active Self  metoprolol succinate (TOPROL-XL) 50 MG 24 hr tablet [id number] Yes Take 25 mg by mouth daily. Take with or immediately following a meal. [provider] Taking Active   NON FORMULARY [id number] Yes as directed. CPAP [provider] Taking Active Self  QUEtiapine (SEROQUEL) 25 MG tablet [id number] Yes Take 1 tablet (25 mg total) by mouth at bedtime. Cloria Spring, MD Taking Active   sacubitril-valsartan [facility]) 24-26 [place] [id number] Yes Take 1 tablet by mouth 2 (two) times daily. [name], MD Taking Active   Semaglutide,0.25 or 0.5MG /DOS, (OZEMPIC, 0.25 OR 0.5 MG/DOSE,) 2 MG/1.5ML SOPN [id number] Yes Inject 0.25 mg weekly for 4 weeks then increase to 0.5 mg weekly  [name] taking  differently: Inject 0.5 mg as directed every Saturday.   Einar Pheasant, MD Taking Active Self  spironolactone (ALDACTONE) 25 MG tablet [id number] Yes Take 0.5 tablets (12.5 mg total) by mouth daily. [name], MD Taking Active Self          [name] Active Problem List   Diagnosis Date Noted  . S/P AVR 10/04/2020  . PTSD (post-traumatic stress disorder) 04/28/2020  . Thyroid nodule 04/28/2020  . Healthcare maintenance 12/26/2019  . Chronic combined systolic and diastolic heart failure ([place])   . Abdominal pain 12/05/2018  . Type 2 diabetes mellitus with hyperglycemia ([place]) 12/05/2018  . Portal hypertensive gastropathy ([place]) 09/16/2018  . Abscess of left groin 07/23/2018  . Obesity, diabetes, and hypertension syndrome ([place]) 04/26/2018  . History of colon polyps 07/05/2017  . Chronic anticoagulation 01/09/2017  . Low testosterone level  in male 09/21/2016  . Atrial fibrillation ([place]) 05/23/2016  . Sleep apnea 05/11/2016  . Nasal congestion 08/26/2015  . Environmental allergies 07/22/2015  . Essential hypertension 06/08/2014  . Hyperlipidemia 06/08/2014  . Aortic valve stenosis     Conditions to be addressed/monitored: Atrial Fibrillation, CHF, HTN, HLD, DMII, Anxiety and Depression  Care Plan : Medication Management  Updates made by Queen Abbett A, RPH since 12/10/2020 12:00 AM    Problem: Diabetes, Heart Failure, Aortic Disease     Long-Range Goal: Disease Progression Prevention   This Visit's Progress: On track  Recent Progress: On track  Priority: High  Note:   Current Barriers:  . Unable to achieve control of diabetes   Pharmacist Clinical Goal(s):  [name] Over the next 90 days, [name] will achieve control of diabetes as evidenced by improvement in A1c through collaboration with PharmD and provider.   Interventions: . 1:1 collaboration with Einar Pheasant, MD regarding development and update of comprehensive plan of care as evidenced by provider attestation and  co-signature . Inter-disciplinary care team collaboration (see longitudinal plan of care) . Comprehensive medication review performed; medication list updated in electronic medical record  Health Maintenance: . Declines COVID booster . S/P aortic valve replacement surgery - 10/04/20 . Initial visit with neurology for worsening gait and balance since open heart surgery.   Diabetes, complicated by heart failure (last EF 40-45%) . Controlled; current treatment: Farxiga 5 mg daily, metformin 1000 mg BID, Ozempic 0.5 mg weekly  . Current glucose readings: fasting glucose: 90s to low 100s, 117 this AM; after meal glucose: 120-150; denies BG <70 . Recommended to continue current regimen . If symptomatic hypoglycemia becomes a concern, consider reduction in metformin rather than Ozempic or [place].   Heart Failure (EF 40-45%): [name] Appropriately managed; current treatment: Entresto 24-26 mg BID, spironolactone 12.5 mg QHS, metoprolol succinate 50 mg QAM, Farxiga 5 mg daily, digoxin 0.0625 mg daily . S/P aortic valve replacement surgery - 10/04/20 - reports dizziness and worsening gait and balance issues - following with neurology . Home BP readings: 141/98 this AM prior to morning meds . Recommended to continue current regimen in collaboration with cardiology. Can consider [place] maximization in the future.   Hyperlipidemia and ASCVD risk reduction: . Uncontrolled, LDL not at goal <70 given diabetes and risk factors; current treatment: atorvastatin 40 mg daily . Antiplatelet therapy: aspirin 81 mg daily . Recommended to continue current regimen. Will discuss potential for increasing statin intensity at future visits  Atrial Fibrillation: . Controlled; current rate control: metoprolol succinate 25 mg daily, digoxin 0.0625 mg daily; anticoagulant treatment: Eliquis 5 mg BID . Recommended to continue current regimen  along with collaboration with cardiology  Mental Health: [name] Moderately well managed.  Current regimen: bupropion XL 150 mg daily, citalopram 40 mg daily, lamotrigine 150 mg daily, quetiapine 25 mg QPM, follows w/ LCSW . Recommended to continue current regimen and collaboration with psychiatry at this time  Allergies: . Controlled per [name] report; cetirizine 10 mg BID, fluticasone intranasal PRN . Recommended to continue current regimen at this time  [name] Goals/Self-Care Activities . Over the next days, [name] will:  - take medications as prescribed check glucose at least twice daily, document, and provide at future appointments  Follow Up Plan: Telephone follow up appointment with care management team member scheduled for: ~7 weeks     Medication Assistance:  None required.  [name] affirms current coverage meets needs.  Follow Up:  [name] agrees to Care Plan and Follow-up.  Plan: Telephone follow up appointment with care management team member scheduled for:  ~7 weeks  Lorel Monaco, PharmD, BCPS PGY2 [place]   I was present for this visit and agree with the documentation by the resident as above.   Catie Darnelle Maffucci, PharmD, [place], [place] Clinical Pharmacist [employer] at [place]

## 2020-12-11 ENCOUNTER — Other Ambulatory Visit: Payer: Self-pay | Admitting: Interventional Cardiology

## 2020-12-11 ENCOUNTER — Other Ambulatory Visit: Payer: Self-pay | Admitting: Internal Medicine

## 2020-12-11 NOTE — Telephone Encounter (Signed)
Spoke with patient to clarify dose as office visit from last month with Dr Tamala Julian notes that the patient reports only taking one-half tablet (25 mg) of his metoprolol succinate. He did verify with me that this is correct (25 mg) daily, he is aware that I will update rx and send to his pharmacy as requested.

## 2020-12-15 ENCOUNTER — Other Ambulatory Visit: Payer: Self-pay

## 2020-12-15 ENCOUNTER — Ambulatory Visit
Admission: RE | Admit: 2020-12-15 | Discharge: 2020-12-15 | Disposition: A | Discharge status: Home / Self Care | Payer: 59 | Source: Ambulatory Visit | Attending: Neurology | Admitting: Neurology

## 2020-12-15 DIAGNOSIS — R296 Repeated falls: Secondary | ICD-10-CM

## 2020-12-15 DIAGNOSIS — R42 Dizziness and giddiness: Secondary | ICD-10-CM | POA: Diagnosis not present

## 2020-12-15 DIAGNOSIS — R4701 Aphasia: Secondary | ICD-10-CM

## 2020-12-15 DIAGNOSIS — Z952 Presence of prosthetic heart valve: Secondary | ICD-10-CM

## 2020-12-15 DIAGNOSIS — I69398 Other sequelae of cerebral infarction: Secondary | ICD-10-CM | POA: Diagnosis not present

## 2020-12-15 DIAGNOSIS — I350 Nonrheumatic aortic (valve) stenosis: Secondary | ICD-10-CM

## 2020-12-15 DIAGNOSIS — I480 Paroxysmal atrial fibrillation: Secondary | ICD-10-CM

## 2020-12-15 MED ORDER — GADOBENATE DIMEGLUMINE 529 MG/ML IV SOLN
20.0000 mL | Freq: Once | INTRAVENOUS | Status: AC | PRN
  Administered 2020-12-15: 20 mL via INTRAVENOUS

## 2020-12-17 NOTE — Progress Notes (Signed)
FINDINGS: Source and reconstructed views were evaluated.  The imaged extracranial and intracranial segments of the internal carotid arteries appear normal. The middle cerebral and anterior cerebral arteries appear normal.  In the posterior circulation, the vertebral arteries are co-dominant. No stenosis is noted within the vertebral arteries and the basilar arteries. The posterior cerebral arteries appear normal.  Posterior communicating arteries are noted, larger on the right.  No aneurysms were identified.   IMPRESSION: This is a normal MR angiogram of the intracranial arteries.

## 2020-12-17 NOTE — Progress Notes (Signed)
IMPRESSION: This MRI of the brain with and without contrast shows the following: 1.   No acute findings. 2.   Few punctate T2/FLAIR hyperintense foci in the hemispheres consistent with minimal chronic microvascular ischemic change, typical for age.  There were no medium or large vessel strokes. 3.   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  These are not expected to be clinically significant.      INTERPRETING PHYSICIAN: Richard A. Felecia Shelling, MD, PhD, FAAN Certified in  [place] by [employer]

## 2020-12-18 ENCOUNTER — Other Ambulatory Visit: Payer: Self-pay

## 2020-12-18 ENCOUNTER — Ambulatory Visit (INDEPENDENT_AMBULATORY_CARE_PROVIDER_SITE_OTHER): Payer: 59 | Admitting: Licensed Clinical Social Worker

## 2020-12-18 ENCOUNTER — Encounter: Payer: Self-pay | Admitting: Neurology

## 2020-12-18 DIAGNOSIS — F4310 Post-traumatic stress disorder, unspecified: Secondary | ICD-10-CM

## 2020-12-18 NOTE — Progress Notes (Signed)
Virtual Visit via Video Note  I connected with Dalton [name] on 12/18/20 at  3:00 PM EDT by a video enabled telemedicine application and verified that I am speaking with the correct person using two identifiers.  Location: Patient: home Provider: remote office [place], [place])   I discussed the limitations of evaluation and management by telemedicine and the availability of in person appointments. The patient expressed understanding and agreed to proceed.  I discussed the assessment and treatment plan with the patient. The patient was provided an opportunity to ask questions and all were answered. The patient agreed with the plan and demonstrated an understanding of the instructions.   The patient was advised to call back or seek an in-person evaluation if the symptoms worsen or if the condition fails to improve as anticipated.  I provided 45 minutes of non-face-to-face time during this encounter.   Kweli Grassel R Griffyn Kucinski, LCSW   THERAPIST PROGRESS NOTE  Session Time: 3-3:45p  Participation Level: Active  Behavioral Response: Neat and Well GroomedAlertAnxious and Depressed  Type of Therapy: Individual Therapy  Treatment Goals addressed: Anxiety and Diagnosis: PTSD  Interventions: CBT and Other: trauma focused  Summary: Dalton [name] is a 55 y.o. male who presents with continuing symptoms related to his anxiety and PTSD diagnosis. Pt reports that overall his mood has been low since his heart surgery "I feel like I just can't do anything". Pt reports that his daughter and wife are great support system and that they have been helping him a lot.  Pt states that his oldest daughter has not called him or checked on him since his surgery.  Allowed pt safe space to explore his thoughts and feeling about this situation. Offered unconditional positive supports.  Pt reports that in addition to his depression, he tried to drive recently and was overcome by anxiety. "My wife said that I  was gripping the steering wheel with white knuckles".  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 Pt expresses significant financial concerns for himself and his family if he cannot go back to work for [employer]. Reviewed panic management/anxiety management and encouraged pt to try to get behind the wheel with his wife again on safe neighborhood streets. Discussed how this could be desensitizing for him. Encouraged pt to keep a log of dates/times that he will try this and overall results. LCSW counselor will call pt in two weeks to review current results.  Continued recommendations are as follows: self care behaviors, positive social engagements, focusing on overall work/home/life balance, and focusing on positive physical and emotional wellness.   Suicidal/Homicidal: No  Therapist Response: Dalton [name] is experiencing some depression symptoms triggered by his recent surgical recovery, which is indicative of fluctuating/intermittent progress. Dalton [name] is willing to set "driving" as a personal goal and will continue to attempt with his wife. Treatment to continue as indicated.   Plan: Return again in 4 weeks.  Diagnosis: Axis I: Post Traumatic Stress Disorder    Axis II: No diagnosis    Rachel Bo Yeraldin Litzenberger, LCSW 12/18/2020

## 2020-12-19 ENCOUNTER — Telehealth: Payer: Self-pay | Admitting: Psychiatry

## 2020-12-19 NOTE — Telephone Encounter (Signed)
Left a voice message to contact back the office regarding his request of refill of lamotrigine. Please verify the last time he took lamotrigine as we need to start from lower dose if he ran out more than five days.

## 2020-12-26 ENCOUNTER — Telehealth: Payer: Self-pay

## 2020-12-26 NOTE — Telephone Encounter (Signed)
received a fax requesting a refill on the lamotrigine 150mg 

## 2020-12-26 NOTE — Telephone Encounter (Signed)
Could you try contacting him again to ask when was the last time he took lamotrigine? If he is not available, ask the pharmacy when the medication was filled last time. Thanks.

## 2020-12-27 NOTE — Telephone Encounter (Signed)
called pharmacy pt last picked up 08-13-20 #30

## 2020-12-27 NOTE — Telephone Encounter (Signed)
Could you advise him to hold lamotrigine at this time given it may have been a while since the last time he took this medication. Will discuss more at his next visit.

## 2020-12-27 NOTE — Telephone Encounter (Signed)
No answer no message could be left mail box not set up .    Pharmacy was sent a message

## 2020-12-31 ENCOUNTER — Other Ambulatory Visit: Payer: Self-pay | Admitting: *Deleted

## 2020-12-31 MED ORDER — SPIRONOLACTONE 25 MG PO TABS: 12.5000 mg | ORAL_TABLET | Freq: Every day | ORAL | 3 refills | Status: DC

## 2020-12-31 NOTE — Progress Notes (Signed)
Virtual Visit via Video Note  I connected with Dalton [name] on 01/03/21 at  2:30 PM EDT by a video enabled telemedicine application and verified that I am speaking with the correct person using two identifiers.  Location: Patient: outside Provider: office Persons participated in the visit- patient, provider    I discussed the limitations of evaluation and management by telemedicine and the availability of in person appointments. The patient expressed understanding and agreed to proceed.  I discussed the assessment and treatment plan with the patient. The patient was provided an opportunity to ask questions and all were answered. The patient agreed with the plan and demonstrated an understanding of the instructions.   The patient was advised to call back or seek an in-person evaluation if the symptoms worsen or if the condition fails to improve as anticipated.  I provided 18 minutes of non-face-to-face time during this encounter.   [name], MD    [facility] MD/PA/NP OP Progress Note  01/03/2021 3:06 PM Dalton [name]  MRN:  [id number]  Chief Complaint:  Chief Complaint   Trauma; Follow-up; Depression    HPI:  This is a follow-up appointment for depression and PTSD.  He states that he is now sitting outside.  He feels calm and relaxed.  He feels stronger as the day goes by.  Although his wife and his daughter mentions he tends to be snappy, he does not recognize it.  He denies any aggression.  He later reflects that he tends to be irritable due to financial strain.  He is currently on short-term disability, and is unable to do things which he used to do.  However, he agrees to explore things now that he enjoys listening to birds, sitting outside.  He thinks therapy has been going very well.  He has not taken lamotrigine for the past few months; he agrees to stay off this medication at this time.  He sleeps 11 to 12 hours.  He feels fatigue.  He did contact his provider about his  CPAP machine; he agreed to do so again to optimize his treatment for sleep.  He feels fatigued at times.  Although his appetite fluctuates, he denies significant change in his weight.  He denies SI.  He denies nightmares or flashback.  He has hypervigilance when he is around people or when he is in traffic.  He occasionally feels anxious and tense.  He denies panic attacks.    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 He was in [place] after 911, no combat experience Support: wife Household: Dalton [name]/his wife Marital status: married with his wife of six years Number of children: 2 daughters, age 79, 83, grandchild, 3 in march,   [place] Readings from Last 3 Encounters:  11/28/20 253 lb (114.8 kg)  11/28/20 251 lb (113.9 kg)  11/13/20 248 lb (112.5 kg)     Visit Diagnosis:    ICD-10-CM   1. PTSD (post-traumatic stress disorder)  F43.10     2. Moderate episode of recurrent major depressive disorder ([place])  F33.1       Past Psychiatric History: Please see initial evaluation for full details. I have reviewed the history. No updates at this time.     Past Medical History:  Past Medical History:  Diagnosis Date   Aortic valve stenosis    a. Bicuspid AV - 2D Echo 06/2014 -  mod AS, mild AI, mildly dilated aortic root.   Arthritis    Neck   CAD (coronary artery disease)    Carotid artery disease ([place])    a. Mild by duplex 05/2015 - 1-39% BICA. Repeat due 05/2017.   Depression    Diabetes mellitus without complication ([place])    Dilated aortic root ([place])    a. By echo 06/2014.   Dyspnea    with exercion   Dysrhythmia    Atrial Fibrilation   Environmental allergies    Headache    MIgraines- rare   Heart murmur    Hypercholesterolemia    Hypertension    Obesity (BMI 30-39.9) 04/26/2018   PTSD (post-traumatic stress disorder)    Sleep apnea    CPAP   SVT  (supraventricular tachycardia) ([place])    a. h/o SVT - Ablation done 2013.    Past Surgical History:  Procedure Laterality Date   AORTIC VALVE REPLACEMENT N/A 10/04/2020   Procedure: AORTIC VALVE REPLACEMENT (AVR);  Surgeon: Gaye Pollack, MD;  Location: [place];  Service: Open Heart Surgery;  Laterality: N/A;   CARDIAC ELECTROPHYSIOLOGY STUDY AND ABLATION  08/21/2011   COLONOSCOPY WITH PROPOFOL N/A 06/26/2017   Procedure: COLONOSCOPY WITH PROPOFOL;  Surgeon: Manya Silvas, MD;  Location: [facility] ENDOSCOPY;  Service: Endoscopy;  Laterality: N/A;   ESOPHAGOGASTRODUODENOSCOPY (EGD) WITH PROPOFOL N/A 06/26/2017   Procedure: ESOPHAGOGASTRODUODENOSCOPY (EGD) WITH PROPOFOL;  Surgeon: Manya Silvas, MD;  Location: [facility] ENDOSCOPY;  Service: Endoscopy;  Laterality: N/A;   MAZE N/A 10/04/2020   Procedure: MAZE;  Surgeon: Gaye Pollack, MD;  Location: [place];  Service: Open Heart Surgery;  Laterality: N/A;   RIGHT/LEFT HEART CATH AND CORONARY ANGIOGRAPHY N/A 11/16/2019   Procedure: RIGHT/LEFT HEART CATH AND CORONARY ANGIOGRAPHY;  Surgeon: [name], MD;  Location: [place] CV LAB;  Service: Cardiovascular;  Laterality: N/A;   SEPTOPLASTY N/A 03/22/2015   Procedure: SEPTOPLASTY  WITH RIGHT INFERIOR TURBINATE REDUCTION;  Surgeon: Margaretha Sheffield, MD;  Location: [place];  Service: ENT;  Laterality: N/A;   SUPRAVENTRICULAR TACHYCARDIA ABLATION N/A 08/21/2011   Procedure: SUPRAVENTRICULAR TACHYCARDIA ABLATION;  Surgeon: Evans Lance, MD;  Location[facility] CATH LAB;  Service: Cardiovascular;  Laterality: N/A;   surgery for non descending testicle     TEE WITHOUT CARDIOVERSION N/A 11/16/2019   Procedure: TRANSESOPHAGEAL ECHOCARDIOGRAM (TEE);  Surgeon: Geralynn Rile, MD;  Location: [place];  Service: Cardiovascular;  Laterality: N/A;   TEE WITHOUT CARDIOVERSION N/A 10/04/2020   Procedure: TRANSESOPHAGEAL ECHOCARDIOGRAM (TEE);  Surgeon: Gaye Pollack, MD;  Location: [place];  Service:  Open Heart Surgery;  Laterality: N/A;   TONSILLECTOMY      Family Psychiatric History: Please see initial evaluation for full details. I have reviewed the history. No updates at this time.     Family History:  Family History  Problem Relation Age of Onset   Diabetes Other    Cancer Other    Breast cancer Maternal Grandmother    Cancer Maternal Grandfather    Aortic stenosis Maternal Grandfather     Social History:  Social History   Socioeconomic History   Marital status: Married    Spouse name: Not on file   Number of children: 2   Years of education: Not on file   Highest education level: Not on file  Occupational History   Occupation: truck driver    Employer: [employer]  Tobacco Use   Smoking status: Former    Pack years:  0.00    Types: Cigarettes    Quit date: 11/04/1988    Years since quitting: 32.1   Smokeless tobacco: Never  Vaping Use   Vaping Use: Never used  Substance and Sexual Activity   Alcohol use: Yes    Comment: occasionally   Drug use: Not Currently    Comment: many years ago in highschool   Sexual activity: Not Currently  Other Topics Concern   Not on file  Social History Narrative   Very rare exercise   Social Determinants of Health   Financial Resource Strain: Low Risk    Difficulty of Paying Living Expenses: Not hard at all  Food Insecurity: Not on file  Transportation Needs: Not on file  Physical Activity: Not on file  Stress: Not on file  Social Connections: Not on file    Allergies: No Known Allergies  Metabolic Disorder Labs: Lab Results  Component Value Date   HGBA1C 6.1 (H) 09/20/2020   MPG 128.37 09/20/2020   No results found for: PROLACTIN Lab Results  Component Value Date   CHOL 137 04/26/2020   TRIG 136.0 04/26/2020   HDL 35.90 (L) 04/26/2020   CHOLHDL 4 04/26/2020   VLDL 27.2 04/26/2020   LDLCALC 74 04/26/2020   LDLCALC 84 12/26/2019   Lab Results  Component Value Date   TSH 1.22 06/09/2019   TSH 1.56  05/14/2018    Therapeutic Level Labs: No results found for: LITHIUM No results found for: VALPROATE No components found for:  CBMZ  Current Medications: Current Outpatient Medications  Medication Sig Dispense Refill   acetaminophen (TYLENOL) 500 MG tablet Take 1,000 mg by mouth every 6 (six) hours as needed for moderate pain or headache. (Patient not taking: Reported on 12/10/2020)     apixaban (ELIQUIS) 5 MG TABS tablet Take 1 tablet (5 mg total) by mouth 2 (two) times daily. 180 tablet 3   aspirin EC 81 MG EC tablet Take 1 tablet (81 mg total) by mouth daily. Swallow whole. 30 tablet 11   atorvastatin (LIPITOR) 40 MG tablet Take 1 tablet (40 mg total) by mouth daily. 30 tablet 2   [START ON 01/09/2021] buPROPion (WELLBUTRIN XL) 150 MG 24 hr tablet Take 1 tablet (150 mg total) by mouth daily. 30 tablet 1   cetirizine (ZYRTEC) 10 MG tablet Take 10 mg by mouth 2 (two) times daily.      citalopram (CELEXA) 40 MG tablet Take 40 mg by mouth every morning.     digoxin (LANOXIN) 0.125 MG tablet Take 0.5 tablets (0.0625 mg total) by mouth daily. 45 tablet 3   FARXIGA 5 MG TABS tablet TAKE 1 TABLET(5 MG) BY MOUTH DAILY BEFORE AND BREAKFAST 30 tablet 2   fluticasone (FLONASE) 50 MCG/ACT nasal spray Place 2 sprays into both nostrils daily as needed for allergies or rhinitis. (Patient not taking: Reported on 12/10/2020)     glucose blood test strip Use as instructed to check sugars BID. Dx e11.9 100 each 12   Lancets (ONETOUCH DELICA PLUS [id number]) MISC USE TWICE DAILY AS DIRECTED 100 each 7   metFORMIN (GLUCOPHAGE) 1000 MG tablet TAKE 1 TABLET BY MOUTH TWICE DAILY 60 tablet 1   metoprolol succinate (TOPROL-XL) 50 MG 24 hr tablet Take 0.5 tablets (25 mg total) by mouth daily. 45 tablet 3   NON FORMULARY as directed. CPAP     [START ON 01/10/2021] QUEtiapine (SEROQUEL) 25 MG tablet Take 1 tablet (25 mg total) by mouth at bedtime. 30 tablet 2  sacubitril-valsartan (ENTRESTO) 24-26 MG Take 1 tablet by mouth 2  (two) times daily. 60 tablet 11   Semaglutide,0.25 or 0.5MG /DOS, (OZEMPIC, 0.25 OR 0.5 MG/DOSE,) 2 MG/1.5ML SOPN Inject 0.25 mg weekly for 4 weeks then increase to 0.5 mg weekly (Patient taking differently: Inject 0.5 mg as directed every Saturday.) 4.5 mL 1   spironolactone (ALDACTONE) 25 MG tablet Take 0.5 tablets (12.5 mg total) by mouth daily. 45 tablet 3   No current facility-administered medications for this visit.     Musculoskeletal: Strength & Muscle Tone:  N/A Gait & Station:  N/A Patient leans: N/A  Psychiatric Specialty Exam: Review of Systems  Psychiatric/Behavioral:  Positive for dysphoric mood. Negative for agitation, behavioral problems, confusion, decreased concentration, hallucinations, self-injury, sleep disturbance and suicidal ideas. The patient is nervous/anxious. The patient is not hyperactive.   All other systems reviewed and are negative.  There were no vitals taken for this visit.There is no height or weight on file to calculate BMI.  General Appearance: Fairly Groomed  Eye Contact:  Good  Speech:  Clear and Coherent  Volume:  Normal  Mood:   good  Affect:  Appropriate, Congruent, and calm  Thought Process:  Coherent  Orientation:  Full (Time, Place, and Person)  Thought Content: Logical   Suicidal Thoughts:  No  Homicidal Thoughts:  No  Memory:  Immediate;   Good  Judgement:  Good  Insight:  Good  Psychomotor Activity:  Normal  Concentration:  Concentration: Good and Attention Span: Good  Recall:  Good  Fund of Knowledge: Good  Language: Good  Akathisia:  No  Handed:  Right  AIMS (if indicated): not done  Assets:  Communication Skills Desire for Improvement  ADL's:  Intact  Cognition: WNL  Sleep:  Fair   Screenings: GAD-7    Health and safety inspector from 07/27/2020 in [facility] [place]  Total GAD-7 Score 13      PHQ2-9    Flowsheet Row Counselor from 12/18/2020 in [place]  from 07/27/2020 in [place] Visit from 03/29/2019 in [place] Office Visit from 07/05/2018 in [place] from 06/20/2016 in [place]  PHQ-2 Total Score 4 3 0 0 1  PHQ-9 Total Score 17 12 0 4 --      Flowsheet Row Counselor from 12/18/2020 in [place] Admission (Discharged) from 10/04/2020 in [place] Testing 60 from 10/02/2020 in [facility] PREADMISSION TESTING  C-SSRS RISK CATEGORY No Risk No Risk No Risk        Assessment and Plan:  AIME MELOCHE is a 55 y.o. 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    1. PTSD (post-traumatic stress disorder) 2. Moderate episode of recurrent major depressive disorder (HCC) There has been overall improvement in his mood symptoms as his physical condition improves status post cardiac surgery.  Other psychosocial stressors includes financial strain, history of being in [employer], and being denied of service connection by [place]. he agrees to stay on the current medication regimen at this time.  Will continue citalopram to target PTSD and depression.  Will continue quetiapine adjunctive treatment for depression and PTSD.  Will continue bupropion to target depression.  Noted that he has been off lamotrigine for the past few months; he agrees to stay off this medication at this time.    # Parasomnia He reports a history of parasomnia.  Per chart review, he did have a home sleep study in last year, and was recommended for further evaluation.  He was advised to  contact his provider.    Plan I have reviewed and updated plans as below  1. Continue Citalopram 40 mg daily 2. Continue bupropion 150 mg daily 3. Discontinue lamotrigine (he has been off this medication at least for a few months) 4. Continue quetiapine 25 mg at night - monitor drowsiness 5. Please contact your provider for your sleep issues/further evaluation  6. Next appointment: 8/16 at 4:30  for 30 mins, video    Past trials of medication: citalopram, lamotrigine, quetiapine, Trazodone,    The patient demonstrates the following risk factors for suicide: Chronic risk factors for suicide include: psychiatric disorder of depression, PTSD. Acute risk factors for suicide include: N/A. Protective factors for this patient include: positive social support, responsibility to others (children, family), coping skills and hope for the future. Considering these factors, the overall suicide risk at this point appears to be low. Patient is appropriate for outpatient follow up.        [name], MD 01/03/2021, 3:06 PM

## 2021-01-03 ENCOUNTER — Telehealth (INDEPENDENT_AMBULATORY_CARE_PROVIDER_SITE_OTHER): Payer: 59 | Admitting: Psychiatry

## 2021-01-03 ENCOUNTER — Other Ambulatory Visit: Payer: Self-pay

## 2021-01-03 ENCOUNTER — Encounter: Payer: Self-pay | Admitting: Psychiatry

## 2021-01-03 DIAGNOSIS — F4310 Post-traumatic stress disorder, unspecified: Secondary | ICD-10-CM | POA: Diagnosis not present

## 2021-01-03 DIAGNOSIS — F331 Major depressive disorder, recurrent, moderate: Secondary | ICD-10-CM

## 2021-01-03 MED ORDER — QUETIAPINE FUMARATE 25 MG PO TABS: 25.0000 mg | ORAL_TABLET | Freq: Every day | ORAL | 2 refills | Status: DC

## 2021-01-03 MED ORDER — BUPROPION HCL ER (XL) 150 MG PO TB24: 150.0000 mg | ORAL_TABLET | Freq: Every day | ORAL | 1 refills | Status: DC

## 2021-01-03 NOTE — Patient Instructions (Signed)
1. Continue Citalopram 40 mg daily 2. Continue bupropion 150 mg daily 3. Discontinue lamotrigine  4. Continue quetiapine 25 mg at night  5. Please contact your provider for your sleep issues/further evaluation  6. Next appointment: 8/16 at 4:30

## 2021-01-04 ENCOUNTER — Other Ambulatory Visit: Payer: Self-pay | Admitting: Internal Medicine

## 2021-01-04 DIAGNOSIS — E1165 Type 2 diabetes mellitus with hyperglycemia: Secondary | ICD-10-CM

## 2021-01-09 ENCOUNTER — Other Ambulatory Visit: Payer: Self-pay

## 2021-01-09 ENCOUNTER — Encounter: Payer: Self-pay | Admitting: Surgery

## 2021-01-09 ENCOUNTER — Ambulatory Visit: Payer: 59 | Admitting: Surgery

## 2021-01-09 VITALS — BP 105/80 | HR 75 | Resp 20 | Ht 73.0 in | Wt 248.0 lb

## 2021-01-09 DIAGNOSIS — Z952 Presence of prosthetic heart valve: Secondary | ICD-10-CM | POA: Diagnosis not present

## 2021-01-10 ENCOUNTER — Other Ambulatory Visit: Payer: Self-pay | Admitting: Internal Medicine

## 2021-01-10 DIAGNOSIS — E1165 Type 2 diabetes mellitus with hyperglycemia: Secondary | ICD-10-CM

## 2021-01-10 NOTE — Progress Notes (Signed)
HPI:  Mr. Dalton [name] returns today for follow-up status post aortic valve replacement with a 25 mm Edwards pericardial valve, biatrial Maze procedure and clipping of left atrial appendage on 10/04/2020.  He had a slow postoperative recovery with fatigue and some orthostatic dizziness as well as decreased appetite.  These have improved.  He reports that he is still having occasional falls due to balance problems.  There is no dizziness or lightheadedness associated with the falls.  He saw neurology and had an MRI of the brain and MR angio of the head which showed no acute findings.  He has been walking without chest pain or shortness of breath and his stamina is improving.  He is hoping to get back to work in the next month or 2 if his stamina continues to improve.  Current Outpatient Medications  Medication Sig Dispense Refill   acetaminophen (TYLENOL) 500 MG tablet Take 1,000 mg by mouth every 6 (six) hours as needed for moderate pain or headache.     apixaban (ELIQUIS) 5 MG TABS tablet Take 1 tablet (5 mg total) by mouth 2 (two) times daily. 180 tablet 3   aspirin EC 81 MG EC tablet Take 1 tablet (81 mg total) by mouth daily. Swallow whole. 30 tablet 11   atorvastatin (LIPITOR) 40 MG tablet Take 1 tablet (40 mg total) by mouth daily. 30 tablet 2   buPROPion (WELLBUTRIN XL) 150 MG 24 hr tablet Take 1 tablet (150 mg total) by mouth daily. 30 tablet 1   cetirizine (ZYRTEC) 10 MG tablet Take 10 mg by mouth 2 (two) times daily.      citalopram (CELEXA) 40 MG tablet Take 40 mg by mouth every morning.     digoxin (LANOXIN) 0.125 MG tablet Take 0.5 tablets (0.0625 mg total) by mouth daily. 45 tablet 3   fluticasone (FLONASE) 50 MCG/ACT nasal spray Place 2 sprays into both nostrils daily as needed for allergies or rhinitis.     glucose blood test strip Use as instructed to check sugars BID. Dx e11.9 100 each 12   Lancets (ONETOUCH DELICA PLUS [id number]) MISC USE TWICE DAILY AS DIRECTED 100 each 7    metFORMIN (GLUCOPHAGE) 1000 MG tablet TAKE 1 TABLET BY MOUTH TWICE DAILY 60 tablet 1   metoprolol succinate (TOPROL-XL) 50 MG 24 hr tablet Take 0.5 tablets (25 mg total) by mouth daily. 45 tablet 3   NON FORMULARY as directed. CPAP     OZEMPIC, 0.25 OR 0.5 MG/DOSE, 2 MG/1.5ML SOPN INJECT 0.25 MG WEEKLY FOR 4 WEEKS THEN INCREASE TO 0.5 MG WEEKLY 4.5 mL 1   QUEtiapine (SEROQUEL) 25 MG tablet Take 1 tablet (25 mg total) by mouth at bedtime. 30 tablet 2   sacubitril-valsartan (ENTRESTO) 24-26 MG Take 1 tablet by mouth 2 (two) times daily. 60 tablet 11   spironolactone (ALDACTONE) 25 MG tablet Take 0.5 tablets (12.5 mg total) by mouth daily. 45 tablet 3   FARXIGA 5 MG TABS tablet TAKE 1 TABLET(5 MG) BY MOUTH DAILY BEFORE AND BREAKFAST 30 tablet 2   No current facility-administered medications for this visit.     Physical Exam: BP 105/80   Pulse 75   Resp 20   Ht 6\' 1"  (1.854 m)   Wt 248 lb (112.5 kg)   SpO2 95% Comment: RA  BMI 32.72 kg/m  He looks well. Cardiac exam shows regular rate and rhythm with normal heart sounds.  There is no murmur. Lungs are clear. The chest incision is well-healed  with a somewhat thickened scar.  The sternum is stable.  There is no peripheral edema.  Diagnostic Tests:  None today  Impression:  He is almost 3-1/2 months following his surgery and now appears to be making progress.  I encouraged him to continue walking as much possible to build up his stamina.  I told him that he can return to normal activity because his sternum should be well-healed by this time.     Plan:  He is going to follow-up with cardiology and neurology and will return to see me if he has any problems with his incisions.  I spent 15 minutes performing this established patient evaluation and > 50% of this time was spent face to face counseling and coordinating the care of this patient's recent aortic valve surgery.  Gaye Pollack, MD [facility] [phone]

## 2021-01-16 ENCOUNTER — Encounter: Payer: Self-pay | Admitting: Internal Medicine

## 2021-01-16 ENCOUNTER — Other Ambulatory Visit: Payer: Self-pay

## 2021-01-16 ENCOUNTER — Encounter: Payer: Self-pay | Admitting: Family Medicine

## 2021-01-24 ENCOUNTER — Ambulatory Visit (INDEPENDENT_AMBULATORY_CARE_PROVIDER_SITE_OTHER): Payer: 59 | Admitting: Family Medicine

## 2021-01-24 ENCOUNTER — Other Ambulatory Visit: Payer: Self-pay

## 2021-01-24 VITALS — BP 98/68 | HR 61 | Temp 98.8°F | Ht 73.0 in | Wt 249.6 lb

## 2021-01-24 DIAGNOSIS — R21 Rash and other nonspecific skin eruption: Secondary | ICD-10-CM | POA: Diagnosis not present

## 2021-01-24 DIAGNOSIS — E1165 Type 2 diabetes mellitus with hyperglycemia: Secondary | ICD-10-CM

## 2021-01-24 LAB — CBC WITH DIFFERENTIAL/PLATELET
Basophils Absolute: 0 10*3/uL (ref 0.0–0.1)
Basophils Relative: 0.6 % (ref 0.0–3.0)
Eosinophils Absolute: 0.1 10*3/uL (ref 0.0–0.7)
Eosinophils Relative: 1.2 % (ref 0.0–5.0)
HCT: 47.1 % (ref 39.0–52.0)
Hemoglobin: 15.4 g/dL (ref 13.0–17.0)
Lymphocytes Relative: 19.7 % (ref 12.0–46.0)
Lymphs Abs: 1.2 10*3/uL (ref 0.7–4.0)
MCHC: 32.8 g/dL (ref 30.0–36.0)
MCV: 89.4 fl (ref 78.0–100.0)
Monocytes Absolute: 0.3 10*3/uL (ref 0.1–1.0)
Monocytes Relative: 5 % (ref 3.0–12.0)
Neutro Abs: 4.6 10*3/uL (ref 1.4–7.7)
Neutrophils Relative %: 73.5 % (ref 43.0–77.0)
Platelets: 163 10*3/uL (ref 150.0–400.0)
RBC: 5.26 Mil/uL (ref 4.22–5.81)
RDW: 15.6 % — ABNORMAL HIGH (ref 11.5–15.5)
WBC: 6.3 10*3/uL (ref 4.0–10.5)

## 2021-01-24 LAB — COMPREHENSIVE METABOLIC PANEL
ALT: 21 U/L (ref 0–53)
AST: 13 U/L (ref 0–37)
Albumin: 4.6 g/dL (ref 3.5–5.2)
Alkaline Phosphatase: 98 U/L (ref 39–117)
BUN: 13 mg/dL (ref 6–23)
CO2: 26 mEq/L (ref 19–32)
Calcium: 9.7 mg/dL (ref 8.4–10.5)
Chloride: 99 mEq/L (ref 96–112)
Creatinine, Ser: 1.12 mg/dL (ref 0.40–1.50)
GFR: 74.04 mL/min (ref >60.00)
Glucose, Bld: 158 mg/dL — ABNORMAL HIGH (ref 70–99)
Potassium: 4.6 mEq/L (ref 3.5–5.1)
Sodium: 137 mEq/L (ref 135–145)
Total Bilirubin: 0.7 mg/dL (ref 0.2–1.2)
Total Protein: 6.6 g/dL (ref 6.0–8.3)

## 2021-01-24 LAB — HEMOGLOBIN A1C: Hgb A1c MFr Bld: 6.1 % (ref 4.6–6.5)

## 2021-01-24 NOTE — Progress Notes (Signed)
Cardiology Office Note:    Date:  01/25/2021   ID:  Dalton [name], DOB June 08, 1966, MRN [id number]  PCP:  Einar Pheasant, MD  Cardiologist:  Sinclair Grooms, MD   Referring MD: Einar Pheasant, MD   No chief complaint on file.   History of Present Illness:    Dalton [name] is a 55 y.o. male with a hx of biscupid aortic valve with AS/AR, prior SVT s/p RF ablation, HTN, HLD, AVR 09/2020, primary hypertension, hyperlipidemia, DM II, OSA on CPAP, and chronic combined systolic and diastolic HF EF [id number].   He is doing okay.  Feels somewhat better.  Feels fatigued.  Wife states that he sleeps a lot.  He has not been very physically active.  He has seen neurology and cardiac surgery.  Cardiac surgery has discharged him.  Neurology did not find any significant evidence of stroke or other problems.  Wife states that he still has some unsteadiness with gait especially if he is sitting and tries to stand.  We do notice on today's exam the blood pressure is relatively low.  Past Medical History:  Diagnosis Date   Aortic valve stenosis    a. Bicuspid AV - 2D Echo 06/2014 - mod AS, mild AI, mildly dilated aortic root.   Arthritis    Neck   CAD (coronary artery disease)    Carotid artery disease ([place])    a. Mild by duplex 05/2015 - 1-39% BICA. Repeat due 05/2017.   Depression    Diabetes mellitus without complication ([place])    Dilated aortic root ([place])    a. By echo 06/2014.   Dyspnea    with exercion   Dysrhythmia    Atrial Fibrilation   Environmental allergies    Headache    MIgraines- rare   Heart murmur    Hypercholesterolemia    Hypertension    Obesity (BMI 30-39.9) 04/26/2018   PTSD (post-traumatic stress disorder)    Sleep apnea    CPAP   SVT (supraventricular tachycardia) ([place])    a. h/o SVT - Ablation done 2013.    Past Surgical History:  Procedure Laterality Date   AORTIC VALVE REPLACEMENT N/A 10/04/2020   Procedure: AORTIC VALVE REPLACEMENT (AVR);   Surgeon: Gaye Pollack, MD;  Location: [place];  Service: Open Heart Surgery;  Laterality: N/A;   CARDIAC ELECTROPHYSIOLOGY STUDY AND ABLATION  08/21/2011   COLONOSCOPY WITH PROPOFOL N/A 06/26/2017   Procedure: COLONOSCOPY WITH PROPOFOL;  Surgeon: Manya Silvas, MD;  Location: [facility] ENDOSCOPY;  Service: Endoscopy;  Laterality: N/A;   ESOPHAGOGASTRODUODENOSCOPY (EGD) WITH PROPOFOL N/A 06/26/2017   Procedure: ESOPHAGOGASTRODUODENOSCOPY (EGD) WITH PROPOFOL;  Surgeon: Manya Silvas, MD;  Location: [facility] ENDOSCOPY;  Service: Endoscopy;  Laterality: N/A;   MAZE N/A 10/04/2020   Procedure: MAZE;  Surgeon: Gaye Pollack, MD;  Location: [place];  Service: Open Heart Surgery;  Laterality: N/A;   RIGHT/LEFT HEART CATH AND CORONARY ANGIOGRAPHY N/A 11/16/2019   Procedure: RIGHT/LEFT HEART CATH AND CORONARY ANGIOGRAPHY;  Surgeon: [name], MD;  Location: [place] CV LAB;  Service: Cardiovascular;  Laterality: N/A;   SEPTOPLASTY N/A 03/22/2015   Procedure: SEPTOPLASTY  WITH RIGHT INFERIOR TURBINATE REDUCTION;  Surgeon: Margaretha Sheffield, MD;  Location: [place];  Service: ENT;  Laterality: N/A;   SUPRAVENTRICULAR TACHYCARDIA ABLATION N/A 08/21/2011   Procedure: SUPRAVENTRICULAR TACHYCARDIA ABLATION;  Surgeon: Evans Lance, MD;  Location: [facility] CATH LAB;  Service: Cardiovascular;  Laterality: N/A;   surgery for  non descending testicle     TEE WITHOUT CARDIOVERSION N/A 11/16/2019   Procedure: TRANSESOPHAGEAL ECHOCARDIOGRAM (TEE);  Surgeon: Geralynn Rile, MD;  Location: [place];  Service: Cardiovascular;  Laterality: N/A;   TEE WITHOUT CARDIOVERSION N/A 10/04/2020   Procedure: TRANSESOPHAGEAL ECHOCARDIOGRAM (TEE);  Surgeon: Gaye Pollack, MD;  Location: [place];  Service: Open Heart Surgery;  Laterality: N/A;   TONSILLECTOMY      Current Medications: Current Meds  Medication Sig   acetaminophen (TYLENOL) 500 MG tablet Take 1,000 mg by mouth every 6 (six) hours as needed for moderate  pain or headache.   apixaban (ELIQUIS) 5 MG TABS tablet Take 1 tablet (5 mg total) by mouth 2 (two) times daily.   atorvastatin (LIPITOR) 40 MG tablet Take 1 tablet (40 mg total) by mouth daily.   buPROPion (WELLBUTRIN XL) 150 MG 24 hr tablet Take 1 tablet (150 mg total) by mouth daily.   cetirizine (ZYRTEC) 10 MG tablet Take 10 mg by mouth 2 (two) times daily.    citalopram (CELEXA) 40 MG tablet Take 40 mg by mouth every morning.   digoxin (LANOXIN) 0.125 MG tablet Take 0.5 tablets (0.0625 mg total) by mouth daily.   FARXIGA 5 MG TABS tablet TAKE 1 TABLET(5 MG) BY MOUTH DAILY BEFORE AND BREAKFAST   fluticasone (FLONASE) 50 MCG/ACT nasal spray Place 2 sprays into both nostrils daily as needed for allergies or rhinitis.   glucose blood test strip Use as instructed to check sugars BID. Dx e11.9   Lancets (ONETOUCH DELICA PLUS 123XX123) MISC USE TWICE DAILY AS DIRECTED   metFORMIN (GLUCOPHAGE) 1000 MG tablet TAKE 1 TABLET BY MOUTH TWICE DAILY   metoprolol succinate (TOPROL XL) 25 MG 24 hr tablet Take 1 tablet (25 mg total) by mouth daily.   NON FORMULARY as directed. CPAP   OZEMPIC, 0.25 OR 0.5 MG/DOSE, 2 MG/1.5ML SOPN INJECT 0.25 MG WEEKLY FOR 4 WEEKS THEN INCREASE TO 0.5 MG WEEKLY   QUEtiapine (SEROQUEL) 25 MG tablet Take 1 tablet (25 mg total) by mouth at bedtime.   sacubitril-valsartan (ENTRESTO) 24-26 MG Take 1 tablet by mouth 2 (two) times daily.   [DISCONTINUED] aspirin EC 81 MG EC tablet Take 1 tablet (81 mg total) by mouth daily. Swallow whole.   [DISCONTINUED] metoprolol succinate (TOPROL-XL) 50 MG 24 hr tablet Take 0.5 tablets (25 mg total) by mouth daily.   [DISCONTINUED] spironolactone (ALDACTONE) 25 MG tablet Take 0.5 tablets (12.5 mg total) by mouth daily.     Allergies:   Patient has no known allergies.   Social History   Socioeconomic History   Marital status: Married    Spouse name: Not on file   Number of children: 2   Years of education: Not on file   Highest  education level: Not on file  Occupational History   Occupation: truck driver    Employer: [employer]  Tobacco Use   Smoking status: Former    Types: Cigarettes    Quit date: 11/04/1988    Years since quitting: 32.2   Smokeless tobacco: Never  Vaping Use   Vaping Use: Never used  Substance and Sexual Activity   Alcohol use: Yes    Comment: occasionally   Drug use: Not Currently    Comment: many years ago in highschool   Sexual activity: Not Currently  Other Topics Concern   Not on file  Social History Narrative   Very rare exercise   Social Determinants of Health   Financial Resource Strain:  Low Risk    Difficulty of Paying Living Expenses: Not hard at all  Food Insecurity: Not on file  Transportation Needs: Not on file  Physical Activity: Not on file  Stress: Not on file  Social Connections: Not on file     Family History: The patient's family history includes Aortic stenosis in his maternal grandfather; Breast cancer in his maternal grandmother; Cancer in his maternal grandfather and another family member; Diabetes in an other family member.  ROS:   Please see the history of present illness.    His wife is hypervigilant about his wellbeing.  She tells me of sleeping for extremely long periods of time such as from 10 PM until 3 PM.  She feels he is sleeping increasingly compared to prior condition.  He has not had syncope.  He has not been very physically active.  He feels he is nearing a situation where he can do most of what he needs to do.  All other systems reviewed and are negative.  EKGs/Labs/Other Studies Reviewed:    The following studies were reviewed today: No new cardiac imaging  EKG:  EKG not repeated  Recent Labs: 10/05/2020: Magnesium 2.5 01/24/2021: ALT 21; BUN 13; Creatinine, Ser 1.12; Hemoglobin 15.4; Platelets 163.0; Potassium 4.6; Sodium 137  Recent Lipid Panel    Component Value Date/Time   CHOL 137 04/26/2020 1133   TRIG 136.0 04/26/2020 1133    HDL 35.90 (L) 04/26/2020 1133   CHOLHDL 4 04/26/2020 1133   VLDL 27.2 04/26/2020 1133   LDLCALC 74 04/26/2020 1133   LDLDIRECT 228.0 05/14/2018 0951    Physical Exam:    VS:  BP 98/85   Pulse 65   Ht '6\' 1"'$  (1.854 m)   Wt 251 lb (113.9 kg)   SpO2 97%   BMI 33.12 kg/m     Wt Readings from Last 3 Encounters:  01/25/21 251 lb (113.9 kg)  01/24/21 249 lb 9.6 oz (113.2 kg)  01/09/21 248 lb (112.5 kg)     GEN: Obese. No acute distress HEENT: Normal NECK: No JVD. LYMPHATICS: No lymphadenopathy CARDIAC: 1/6 systolic with no diastolic murmur. IIRR no gallop, or edema. VASCULAR:  Normal Pulses. No bruits. RESPIRATORY:  Clear to auscultation without rales, wheezing or rhonchi  ABDOMEN: Soft, non-tender, non-distended, No pulsatile mass, MUSCULOSKELETAL: No deformity  SKIN: Warm and dry NEUROLOGIC:  Alert and oriented x 3 PSYCHIATRIC:  Normal affect   ASSESSMENT:    1. S/P AVR (aortic valve replacement)   2. Chronic combined systolic and diastolic HF (heart failure), NYHA class 2 ([place])   3. Essential hypertension   4. Persistent atrial fibrillation ([place])   5. Chronic anticoagulation   6. Hyperlipidemia, unspecified hyperlipidemia type   7. Obstructive sleep apnea syndrome    PLAN:    In order of problems listed above:  Status post aortic valve replacement bioprosthesis.  Functioning normally.  Discontinue aspirin.  Continue Eliquis. No evidence of volume overload.  Blood pressures are low.  Stop spironolactone.  May have to [place]. Low blood pressures.  Hopefully stopping spironolactone will help improve. Controlled rate.  Continue apixaban. Same.  Watch for bleeding. Continue Lipitor 40 mg/day. Continue to wear CPAP.  3-4 month f/u.   Medication Adjustments/Labs and Tests Ordered: Current medicines are reviewed at length with the patient today.  Concerns regarding medicines are outlined above.  No orders of the defined types were placed in this  encounter.  Meds ordered this encounter  Medications   metoprolol succinate (  TOPROL XL) 25 MG 24 hr tablet    Sig: Take 1 tablet (25 mg total) by mouth daily.    Dispense:  90 tablet    Refill:  3    Patient Instructions  Medication Instructions:  1) DISCONTINUE Aspirin 2) DISCONTINUE Spironolactone  *If you need a refill on your cardiac medications before your next appointment, please call your pharmacy*   Lab Work: None  If you have labs (blood work) drawn today and your tests are completely normal, you will receive your results only by: [place] (if you have MyChart) OR A paper copy in the mail If you have any lab test that is abnormal or we need to change your treatment, we will call you to review the results.   Testing/Procedures: None   Follow-Up: At [facility], you and your health needs are our priority.  As part of our continuing mission to provide you with exceptional heart care, we have created designated Provider Care Teams.  These Care Teams include your primary Cardiologist (physician) and Advanced Practice Providers (APPs -  Physician Assistants and Nurse Practitioners) who all work together to provide you with the care you need, when you need it.  We recommend signing up for the patient portal called "MyChart".  Sign up information is provided on this After Visit Summary.  MyChart is used to connect with patients for Virtual Visits (Telemedicine).  Patients are able to view lab/test results, encounter notes, upcoming appointments, etc.  Non-urgent messages can be sent to your provider as well.   To learn more about what you can do with MyChart, go to [email or web address].    Your next appointment:   3 month(s)  The format for your next appointment:   In Person  Provider:   You may see Sinclair Grooms, MD or one of the following Advanced Practice Providers on your designated Care Team:   Cecilie Kicks, NP   Other Instructions      Signed, Sinclair Grooms, MD  01/25/2021 4:05 PM    [place]

## 2021-01-24 NOTE — Progress Notes (Signed)
Tommi Rumps, MD Phone: [phone]  Dalton [name] is a 55 y.o. male who presents today for same-day visit.  Rash: Patient notes onset of rash over the last several weeks.  He notes no itching.  Notes it is possibly getting worse.  He had Entresto and spironolactone started March after aortic valve repair.  No recent changes to soaps or detergents.  No travel.  No known exposures.  Does have a history of allergies though has not been able to get his allergy shots recently.  Reports rash on his face in a malar distribution.  Also has rash in his antecubital fossa's as well as near his axillae bilaterally and rash on his left thigh.  Social History   Tobacco Use  Smoking Status Former   Types: Cigarettes   Quit date: 11/04/1988   Years since quitting: 32.2  Smokeless Tobacco Never    Current Outpatient Medications on File Prior to Visit  Medication Sig Dispense Refill   acetaminophen (TYLENOL) 500 MG tablet Take 1,000 mg by mouth every 6 (six) hours as needed for moderate pain or headache.     apixaban (ELIQUIS) 5 MG TABS tablet Take 1 tablet (5 mg total) by mouth 2 (two) times daily. 180 tablet 3   aspirin EC 81 MG EC tablet Take 1 tablet (81 mg total) by mouth daily. Swallow whole. 30 tablet 11   atorvastatin (LIPITOR) 40 MG tablet Take 1 tablet (40 mg total) by mouth daily. 30 tablet 2   buPROPion (WELLBUTRIN XL) 150 MG 24 hr tablet Take 1 tablet (150 mg total) by mouth daily. 30 tablet 1   cetirizine (ZYRTEC) 10 MG tablet Take 10 mg by mouth 2 (two) times daily.      citalopram (CELEXA) 40 MG tablet Take 40 mg by mouth every morning.     digoxin (LANOXIN) 0.125 MG tablet Take 0.5 tablets (0.0625 mg total) by mouth daily. 45 tablet 3   FARXIGA 5 MG TABS tablet TAKE 1 TABLET(5 MG) BY MOUTH DAILY BEFORE AND BREAKFAST 30 tablet 2   fluticasone (FLONASE) 50 MCG/ACT nasal spray Place 2 sprays into both nostrils daily as needed for allergies or rhinitis.     glucose blood test strip  Use as instructed to check sugars BID. Dx e11.9 100 each 12   Lancets (ONETOUCH DELICA PLUS ENIDPO24M) MISC USE TWICE DAILY AS DIRECTED 100 each 7   metFORMIN (GLUCOPHAGE) 1000 MG tablet TAKE 1 TABLET BY MOUTH TWICE DAILY 60 tablet 1   metoprolol succinate (TOPROL-XL) 50 MG 24 hr tablet Take 0.5 tablets (25 mg total) by mouth daily. 45 tablet 3   NON FORMULARY as directed. CPAP     OZEMPIC, 0.25 OR 0.5 MG/DOSE, 2 MG/1.5ML SOPN INJECT 0.25 MG WEEKLY FOR 4 WEEKS THEN INCREASE TO 0.5 MG WEEKLY 4.5 mL 1   QUEtiapine (SEROQUEL) 25 MG tablet Take 1 tablet (25 mg total) by mouth at bedtime. 30 tablet 2   sacubitril-valsartan (ENTRESTO) 24-26 MG Take 1 tablet by mouth 2 (two) times daily. 60 tablet 11   spironolactone (ALDACTONE) 25 MG tablet Take 0.5 tablets (12.5 mg total) by mouth daily. 45 tablet 3   No current facility-administered medications on file prior to visit.     ROS see history of present illness  Objective  Physical Exam Vitals:   01/24/21 1045  BP: 98/68  Pulse: 61  Temp: 98.8 F (37.1 C)  SpO2: 97%    BP Readings from Last 3 Encounters:  01/24/21 98/68  01/09/21  105/80  11/28/20 108/76   Wt Readings from Last 3 Encounters:  01/24/21 249 lb 9.6 oz (113.2 kg)  01/09/21 248 lb (112.5 kg)  11/28/20 253 lb (114.8 kg)    Physical Exam  Left antecubital fossa, no tenderness, this does blanch, there is no flaking, there are no raised edges The patient has a similar rash in his opposite antecubital fossa as well as on his right and left flanks and upper his face and a macular distribution  Assessment/Plan: Please see individual problem list.  Problem List Items Addressed This Visit     Malar rash - Primary    Certainly there is concern for lupus as a cause of his rash given the distribution on his face.  Based on review of spironolactone it looks like that can cause a chloasma rash which could appear similar as well.  We will obtain lab work to evaluate for a  potential cause of his rash and if there is no obvious cause on labs he may need to discuss the spironolactone if his cardiology team.       Relevant Orders   CBC w/Diff (Completed)   Comp Met (CMET) (Completed)   Antinuclear Antib (ANA)   Anti-DNA antibody, double-stranded   Type 2 diabetes mellitus with hyperglycemia ([place])   Relevant Orders   HgB A1c (Completed)  Message sent to Dr Daneen Schick about the potential connection between his rash and the spironolactone.   Return in about 2 months (around 03/27/2021) for PCP.  This visit occurred during the SARS-CoV-2 public health emergency.  Safety protocols were in place, including screening questions prior to the visit, additional usage of staff PPE, and extensive cleaning of exam room while observing appropriate contact time as indicated for disinfecting solutions.    Tommi Rumps, MD [place]

## 2021-01-24 NOTE — Patient Instructions (Signed)
Nice to see you. We are going to start with lab work today. We will let you know what the results show and that will help determine an appropriate treatment.

## 2021-01-25 ENCOUNTER — Encounter: Payer: Self-pay | Admitting: Interventional Cardiology

## 2021-01-25 ENCOUNTER — Ambulatory Visit (INDEPENDENT_AMBULATORY_CARE_PROVIDER_SITE_OTHER): Payer: 59 | Admitting: Interventional Cardiology

## 2021-01-25 VITALS — BP 98/85 | HR 65 | Ht 73.0 in | Wt 251.0 lb

## 2021-01-25 DIAGNOSIS — I4819 Other persistent atrial fibrillation: Secondary | ICD-10-CM

## 2021-01-25 DIAGNOSIS — E785 Hyperlipidemia, unspecified: Secondary | ICD-10-CM

## 2021-01-25 DIAGNOSIS — I5042 Chronic combined systolic (congestive) and diastolic (congestive) heart failure: Secondary | ICD-10-CM | POA: Diagnosis not present

## 2021-01-25 DIAGNOSIS — Z952 Presence of prosthetic heart valve: Secondary | ICD-10-CM

## 2021-01-25 DIAGNOSIS — I10 Essential (primary) hypertension: Secondary | ICD-10-CM

## 2021-01-25 DIAGNOSIS — R21 Rash and other nonspecific skin eruption: Secondary | ICD-10-CM | POA: Insufficient documentation

## 2021-01-25 DIAGNOSIS — G4733 Obstructive sleep apnea (adult) (pediatric): Secondary | ICD-10-CM

## 2021-01-25 DIAGNOSIS — Z7901 Long term (current) use of anticoagulants: Secondary | ICD-10-CM

## 2021-01-25 MED ORDER — METOPROLOL SUCCINATE ER 25 MG PO TB24
25.0000 mg | ORAL_TABLET | Freq: Every day | ORAL | 3 refills | Status: DC
Start: 2021-01-25 — End: 2022-02-04

## 2021-01-25 NOTE — Assessment & Plan Note (Signed)
Certainly there is concern for lupus as a cause of his rash given the distribution on his face.  Based on review of spironolactone it looks like that can cause a chloasma rash which could appear similar as well.  We will obtain lab work to evaluate for a potential cause of his rash and if there is no obvious cause on labs he may need to discuss the spironolactone if his cardiology team.

## 2021-01-25 NOTE — Patient Instructions (Addendum)
Medication Instructions:  1) DISCONTINUE Aspirin 2) DISCONTINUE Spironolactone  *If you need a refill on your cardiac medications before your next appointment, please call your pharmacy*   Lab Work: None  If you have labs (blood work) drawn today and your tests are completely normal, you will receive your results only by: [place] (if you have MyChart) OR A paper copy in the mail If you have any lab test that is abnormal or we need to change your treatment, we will call you to review the results.   Testing/Procedures: None   Follow-Up: At [facility], you and your health needs are our priority.  As part of our continuing mission to provide you with exceptional heart care, we have created designated Provider Care Teams.  These Care Teams include your primary Cardiologist (physician) and Advanced Practice Providers (APPs -  Physician Assistants and Nurse Practitioners) who all work together to provide you with the care you need, when you need it.  We recommend signing up for the patient portal called "MyChart".  Sign up information is provided on this After Visit Summary.  MyChart is used to connect with patients for Virtual Visits (Telemedicine).  Patients are able to view lab/test results, encounter notes, upcoming appointments, etc.  Non-urgent messages can be sent to your provider as well.   To learn more about what you can do with MyChart, go to [email or web address].    Your next appointment:   3-4 month(s)  The format for your next appointment:   In Person  Provider:   You may see Sinclair Grooms, MD or one of the following Advanced Practice Providers on your designated Care Team:   Cecilie Kicks, NP   Other Instructions

## 2021-01-27 LAB — ANTI-NUCLEAR AB-TITER (ANA TITER): ANA Titer 1: 1:160 {titer} — ABNORMAL HIGH

## 2021-01-27 LAB — ANA: Anti Nuclear Antibody (ANA): POSITIVE — AB

## 2021-01-27 LAB — ANTI-DNA ANTIBODY, DOUBLE-STRANDED: ds DNA Ab: <1 IU/mL

## 2021-01-28 ENCOUNTER — Other Ambulatory Visit: Payer: Self-pay

## 2021-01-28 ENCOUNTER — Ambulatory Visit (INDEPENDENT_AMBULATORY_CARE_PROVIDER_SITE_OTHER): Payer: 59 | Admitting: Licensed Clinical Social Worker

## 2021-01-28 ENCOUNTER — Ambulatory Visit: Payer: 59 | Admitting: Pharmacist

## 2021-01-28 DIAGNOSIS — I4891 Unspecified atrial fibrillation: Secondary | ICD-10-CM

## 2021-01-28 DIAGNOSIS — E785 Hyperlipidemia, unspecified: Secondary | ICD-10-CM

## 2021-01-28 DIAGNOSIS — F4310 Post-traumatic stress disorder, unspecified: Secondary | ICD-10-CM

## 2021-01-28 DIAGNOSIS — E1165 Type 2 diabetes mellitus with hyperglycemia: Secondary | ICD-10-CM

## 2021-01-28 DIAGNOSIS — I350 Nonrheumatic aortic (valve) stenosis: Secondary | ICD-10-CM

## 2021-01-28 DIAGNOSIS — I10 Essential (primary) hypertension: Secondary | ICD-10-CM

## 2021-01-28 DIAGNOSIS — F331 Major depressive disorder, recurrent, moderate: Secondary | ICD-10-CM

## 2021-01-28 MED ORDER — ATORVASTATIN CALCIUM 40 MG PO TABS
40.0000 mg | ORAL_TABLET | Freq: Every day | ORAL | 3 refills | Status: DC
Start: 2021-01-28 — End: 2022-02-04

## 2021-01-28 MED ORDER — METFORMIN HCL 500 MG PO TABS: 500.0000 mg | ORAL_TABLET | Freq: Two times a day (BID) | ORAL | 1 refills | Status: DC

## 2021-01-28 NOTE — Progress Notes (Signed)
Virtual Visit via Video Note  I connected with Dalton [name] on 01/28/21 at  2:00 PM EDT by a video enabled telemedicine application and verified that I am speaking with the correct person using two identifiers.  Location: Patient: home/car Provider: remote office [place], [place])   I discussed the limitations of evaluation and management by telemedicine and the availability of in person appointments. The patient expressed understanding and agreed to proceed.   I discussed the assessment and treatment plan with the patient. The patient was provided an opportunity to ask questions and all were answered. The patient agreed with the plan and demonstrated an understanding of the instructions.   The patient was advised to call back or seek an in-person evaluation if the symptoms worsen or if the condition fails to improve as anticipated.  I provided 34 minutes of non-face-to-face time during this encounter.   Sonya Pucci R Nyrah Demos, LCSW    THERAPIST PROGRESS NOTE  Session Time: 2-2:34p  Participation Level: Active  Behavioral Response: Neat and Well GroomedAlertAnxious  Type of Therapy: Individual Therapy  Treatment Goals addressed: Anxiety, Coping, and Diagnosis: depression, PTSD  Interventions: CBT, Supportive, and Other: trauma focused  Summary: Dalton [name] is a 55 y.o. male who presents with Continuing symptoms related to PTSD diagnosis. Patient reports that currently he is out and about with his father-in-law having lunch period patient is being intentional about socializing with friends and family members. Patient reports that overall mood is stable, and that he is managing anxiety levels well. Patient does report that he is feeling low energy, low motivation, and low initiative. Patient reports that he is sleeping more than he typically sleeps, and that this is out of character for him. Listening to patient talk, assessed patient using PHQ 9. Score was higher at this  session compared to the past. Patient did state that his medical doctor is taking him off of a blood pressure medicine that patient feels did slow him down and was making him feel flat. Patient has appointment with psychiatrist in two weeks.  patient reports that he wanted to go back to work a few weeks ago, but the doctor was not ready to clear him due to continuing hypertension. Discuss daily structure, and daily eating patterns. Patient reports that since his sleep patterns are erratic right now, he will sometimes sleep until 3:30 in the afternoon, which means he misses breakfast and lunch.Continued recommendations are as follows: self care behaviors, positive social engagements, focusing on overall work/home/life balance, and focusing on positive physical and emotional wellness.   Suicidal/Homicidal: No  Therapist Response: . Patient is able to identify a major life conflicts from the past and present that formed the basis for present anxiety. Patient is able to describe current and past experiences with specific fears, prominent worries, and anxiety symptoms including their impact on functioning and the symbols to resolve it. Patient is able to identify and anxiety coping mechanism that has been successful in the past and increase its use. Patient is able to engage in activities to elevate mood and show evidence of increased energy, and socialization. Patient is continuing to work on reducing irritability and increasing normal social interaction with friends and family. These behaviors are reflective of continuing personal growth and progress. Treatment to continue as indicated.  Patient stated that energy levels are low this session compared to last session. Patient states that medical doctor took him off of some blood pressure medicine, so he is hoping that he will have more  energy soon. Patient reports he has an appointment with psychiatrist in two weeks, and hopefully energy levels will have  increased.    Plan: Return again in 4 weeks.  Diagnosis: Axis I: PTSD, MDD, recurrent    Axis II: No diagnosis    Rachel Bo Daejah Klebba, LCSW 01/28/2021

## 2021-01-28 NOTE — Chronic Care Management (AMB) (Signed)
Care Management   Pharmacy Note  01/28/2021 Name: Dalton [name] MRN: [id number] DOB: 19-Oct-1965  Subjective: Dalton [name] is a 55 y.o. year old male who is a primary care patient of Einar Pheasant, MD. The Care Management team was consulted for assistance with care management and care coordination needs.    Engaged with patient by telephone for follow up visit in response to provider referral for pharmacy case management and/or care coordination services. His wife, Butch Penny, was also present for the call.  The patient was given information about Care Management services today including:  Care Management services includes personalized support from designated clinical staff supervised by the patient's primary care provider, including individualized plan of care and coordination with other care providers. 24/7 contact phone numbers for assistance for urgent and routine care needs. The patient may stop case management services at any time by phone call to the office staff.  Patient agreed to services and consent obtained.  Assessment:  Review of patient status, including review of consultants reports, laboratory and other test data, was performed as part of comprehensive evaluation and provision of chronic care management services.   SDOH (Social Determinants of Health) assessments and interventions performed:  SDOH Interventions    Flowsheet Row Most Recent Value  SDOH Interventions   Financial Strain Interventions Intervention Not Indicated        Objective:  Lab Results  Component Value Date   CREATININE 1.12 01/24/2021   CREATININE 1.01 11/13/2020   CREATININE 1.00 10/07/2020    Lab Results  Component Value Date   HGBA1C 6.1 01/24/2021       Component Value Date/Time   CHOL 137 04/26/2020 1133   TRIG 136.0 04/26/2020 1133   HDL 35.90 (L) 04/26/2020 1133   CHOLHDL 4 04/26/2020 1133   VLDL 27.2 04/26/2020 1133   [place] 74 04/26/2020 1133   LDLDIRECT 228.0  05/14/2018 0951    Clinical ASCVD: No  The 10-year ASCVD risk score [name], et al., 2013) is: 6.7%   Values used to calculate the score:     Age: 1 years     Sex: Male     Is Non-Hispanic African American: No     Diabetic: Yes     Tobacco smoker: No     Systolic Blood Pressure: 98 mmHg     Is BP treated: Yes     HDL Cholesterol: 35.9 mg/dL     Total Cholesterol: 137 mg/dL     CHA2DS2-VASc Score = 3  This indicates a 3.2% annual risk of stroke. The patient's score is based upon: CHF History: Yes HTN History: Yes Diabetes History: Yes Stroke History: No Vascular Disease History: No Age Score: 0 Gender Score: 0    BP Readings from Last 3 Encounters:  01/25/21 98/85  01/24/21 98/68  01/09/21 105/80    Care Plan  No Known Allergies  Medications Reviewed Today     Reviewed by De Hollingshead, RPH-CPP (Pharmacist) on 01/28/21 at 57  Med List Status: <None>   Medication Order Taking? Sig Documenting Provider Last Dose Status Informant  acetaminophen (TYLENOL) 500 MG tablet [id number] No Take 1,000 mg by mouth every 6 (six) hours as needed for moderate pain or headache.  Patient not taking: Reported on 01/28/2021   [provider] Not Taking Active   apixaban (ELIQUIS) 5 MG TABS tablet [id number] Yes Take 1 tablet (5 mg total) by mouth 2 (two) times daily. [name], MD Taking Active Self  atorvastatin (  LIPITOR) 40 MG tablet [id number] Yes Take 1 tablet (40 mg total) by mouth daily. Einar Pheasant, MD Taking Active   buPROPion (WELLBUTRIN XL) 150 MG 24 hr tablet [id number] Yes Take 1 tablet (150 mg total) by mouth daily. [name], MD Taking Active   cetirizine (ZYRTEC) 10 MG tablet [id number] Yes Take 10 mg by mouth 2 (two) times daily.  [provider] Taking Active Self           Med Note [name], [name]   Tue Aug 10, 2018  8:35 PM)    citalopram (CELEXA) 40 MG tablet [id number] Yes Take 40 mg by mouth every morning. [provider] Taking Active Self  digoxin (LANOXIN) 0.125 MG tablet [id number] Yes Take 0.5 tablets (0.0625 mg total) by mouth daily. [name], MD Taking Active   FARXIGA 5 MG TABS tablet [id number] Yes TAKE 1 TABLET(5 MG) BY MOUTH DAILY BEFORE AND Laveda Norman, MD Taking Active   fluticasone (FLONASE) 50 MCG/ACT nasal spray [id number] Yes Place 2 sprays into both nostrils daily as needed for allergies or rhinitis. [provider] Taking Active   glucose blood test strip [id number]  Use as instructed to check sugars BID. Dx e11.9 Einar Pheasant, MD  Active Self  Lancets (ONETOUCH DELICA PLUS [id number]) [place] [id number]  USE TWICE DAILY AS DIRECTED Einar Pheasant, MD  Active Self  metFORMIN (GLUCOPHAGE) 1000 MG tablet [id number] Yes TAKE 1 TABLET BY MOUTH TWICE DAILY Einar Pheasant, MD Taking Active   metoprolol succinate (TOPROL XL) 25 MG 24 hr tablet [id number] Yes Take 1 tablet (25 mg total) by mouth daily. [name], MD Taking Active   NON FORMULARY [id number] Yes as directed. CPAP [provider] Taking Active Self  OZEMPIC, 0.25 OR 0.5 MG/DOSE, 2 MG/1.5ML SOPN [id number] Yes INJECT 0.25 MG WEEKLY FOR 4 WEEKS THEN INCREASE TO 0.5 MG WEEKLY Einar Pheasant, MD Taking Active   QUEtiapine (SEROQUEL) 25 MG tablet [id number] Yes Take 1 tablet (25 mg total) by mouth at bedtime. [name], MD Taking Active   sacubitril-valsartan [facility]) 24-26 [place] [id number] Yes Take 1 tablet by mouth 2 (two) times daily. [name], MD Taking Active             Patient Active Problem List   Diagnosis Date Noted   Malar rash 01/25/2021   S/P AVR 10/04/2020   PTSD (post-traumatic stress disorder) 04/28/2020   Thyroid nodule 04/28/2020   Healthcare maintenance 12/26/2019   Chronic combined systolic and diastolic heart failure ([place])    Abdominal pain 12/05/2018   Type 2 diabetes mellitus with hyperglycemia ([place]) 12/05/2018   Portal hypertensive gastropathy ([place])  09/16/2018   Abscess of left groin 07/23/2018   Occipital headache 04/30/2018   Neck pain 04/30/2018   Cervical radiculopathy 04/30/2018   Obesity, diabetes, and hypertension syndrome ([place]) 04/26/2018   History of colon polyps 07/05/2017   Chronic anticoagulation 01/09/2017   Low testosterone level in male 09/21/2016   Atrial fibrillation ([place]) 05/23/2016   Sleep apnea 05/11/2016   Nasal congestion 08/26/2015   Environmental allergies 07/22/2015   Other specified cardiac arrhythmias 07/01/2014   Aortic valve disorder 07/01/2014   Essential hypertension 06/08/2014   Hyperlipidemia 06/08/2014   Aortic valve stenosis     Conditions to be addressed/monitored: Atrial Fibrillation, CHF, HTN, HLD, and DMII  Care Plan : Medication Management  Updates made by De Hollingshead, RPH-CPP since 01/28/2021 12:00 AM     Problem: Diabetes, Heart Failure, Aortic Disease  Long-Range Goal: Disease Progression Prevention   This Visit's Progress: On track  Recent Progress: On track  Priority: High  Note:   Current Barriers:  Unable to achieve control of diabetes   Pharmacist Clinical Goal(s):  Over the next 90 days, patient will achieve control of diabetes as evidenced by improvement in A1c through collaboration with PharmD and provider.   Interventions: 1:1 collaboration with Einar Pheasant, MD regarding development and update of comprehensive plan of care as evidenced by provider attestation and co-signature Inter-disciplinary care team collaboration (see longitudinal plan of care) Comprehensive medication review performed; medication list updated in electronic medical record  Diabetes: Controlled per last A1c; current treatment: Farxiga 5 mg daily, metformin 1000 mg BID, Ozempic 0.5 mg weekly  Does report some nausea/stomach upset if he doesn't eat before he takes his medications Current glucose readings: fasting glucose: 90s to low 100s;  Discussed that we could trial a lower  dose of metformin to see if improvement in GI upset. Discuss potential to increase Farxiga or Ozempic moving forward if needed. Would also be prudent to increase Farxiga moving forward from a HF-evidence perspective. Patient amenable. Reduce metformin to 500 mg BID. Script sent to pharmacy. Advised to continue to monitor BG at home  Heart Failure, Atrial Fibrillation: Appropriately managed; current treatment: Entresto 24/26 mg BID, metoprolol succinate 25 mg QAM, Farxiga 5 mg daily, digoxin 0.0625 mg daily Anticoagulant treatment: Eliquis 5 mg BID Butch Penny confirms she was able to re-enroll in Eliquis and Entresto savings cards. S/p aortic valve replacement 3/22 Hx spironolactone - stopped d/t lower BP, questionable relation to bilateral rash Home BP readings: 115/73, believes his HR was in the 70s this morning. Denies any lightheadedness, dizziness that he noticed with low BP last Friday.  Recommended to continue current regimen in collaboration with cardiology. Consider [place] maximization in the future.  Reviewed that Dr. Caryl Bis would be in touch with recommendations as related to autoimmune lab work  Hyperlipidemia and ASCVD risk reduction: Uncontrolled, LDL not at goal <70 given diabetes and risk factors; current treatment: atorvastatin 40 mg daily Antiplatelet therapy: aspirin 81 mg daily Reviewed fill history. Has not been filled in 2022. Script was last sent from a [facility] facility in 2020. However, wife does confirm that he has been taking the medication. Resent script to [facility] to support adherence. Recommend recheck of lipids with next lab work and maximization to 80 mg daily if LDL not at goal <70  Depression/Anxiety: Moderately well managed. Current regimen: bupropion XL 150 mg daily, citalopram 40 mg daily, quetiapine 25 mg QPM, follows w/ LCSW and psychiatry. Appointment later this afternoon.  Recommended to continue current regimen and collaboration with psychiatry at this  time  Allergies: Controlled per patient report; cetirizine 10 mg BID, fluticasone intranasal PRN Recommended to continue current regimen at this time  Patient Goals/Self-Care Activities Over the next days, patient will:  - take medications as prescribed check glucose at least twice daily, document, and provide at future appointments  Follow Up Plan: Telephone follow up appointment with care management team member scheduled for: ~10 weeks     Medication Assistance:  None required.  Patient affirms current coverage meets needs.  Follow Up:  Patient agrees to Care Plan and Follow-up.  Plan: Telephone follow up appointment with care management team member scheduled for:  ~ 10 weeks  Catie Darnelle Maffucci, PharmD, [place], [place] Clinical Pharmacist [employer] [facility] [employer] [phone]

## 2021-01-28 NOTE — Patient Instructions (Signed)
Visit Information  PATIENT GOALS:  Goals Addressed               This Visit's Progress     Patient Stated     Medication Monitoring (pt-stated)        Patient Goals/Self-Care Activities Over the next days, patient will:  - take medications as prescribed check glucose at least twice daily, document, and provide at future appointments        Patient verbalizes understanding of instructions provided today and agrees to view in [place].   Plan: Telephone follow up appointment with care management team member scheduled for:  ~ 10 weeks  Catie Darnelle Maffucci, PharmD, [place], [place] Clinical Pharmacist [employer] [facility] [employer] [phone]

## 2021-01-31 ENCOUNTER — Telehealth: Payer: Self-pay

## 2021-01-31 ENCOUNTER — Encounter: Payer: Self-pay | Admitting: Family Medicine

## 2021-01-31 NOTE — Telephone Encounter (Signed)
I called and spoke with patient & gave him lab resulted resulted by Dr. Caryl Bis.

## 2021-01-31 NOTE — Telephone Encounter (Signed)
Pt returned your call.  

## 2021-02-01 NOTE — Telephone Encounter (Signed)
Spoke with pt's wife, DPR and advised per Dr Tamala Julian pt should follow up with PCP re: current issues.  Pt's wife verbalizes understanding and thanked Therapist, sports for the call.

## 2021-02-01 NOTE — Telephone Encounter (Signed)
Spoke with pt's wife, DPR who states pt continues to have pain in his tight arm from his elbow to his shoulder.  Pt is complaining of pain under his right breast today that is worse when he takes a deep breath.  He is also complaining of pressure behind his eyes but does not describe pressure as a headache.  Pt has not tried any medication for his discomfort and normally takes Tylenol if needed.  He was given a diagnosis yesterday by his PCP of Auto Immune Disease -possible Lupus  and is being referred to Rheumatology for further evaluation.  Pt is now napping per wife report.  Reviewed ED precautions with wife.  Will forward information to Dr Tamala Julian for review and recommendation but may want to contact PCP as well.  Pt's wife states she will have pt take OTC Tylenol when he wakes from nap if he is still having discomfort.

## 2021-02-03 ENCOUNTER — Other Ambulatory Visit: Payer: Self-pay | Admitting: Family Medicine

## 2021-02-03 DIAGNOSIS — R768 Other specified abnormal immunological findings in serum: Secondary | ICD-10-CM

## 2021-02-04 ENCOUNTER — Telehealth: Payer: Self-pay | Admitting: Interventional Cardiology

## 2021-02-04 NOTE — Telephone Encounter (Signed)
HIM called the patient and left a message to inform him of the form process, $29 payment, and authorization needed to process the form.  AO 02/04/21

## 2021-02-06 ENCOUNTER — Telehealth: Payer: Self-pay | Admitting: Interventional Cardiology

## 2021-02-06 DIAGNOSIS — Z0279 Encounter for issue of other medical certificate: Secondary | ICD-10-CM

## 2021-02-06 NOTE — Telephone Encounter (Signed)
The patient came into the office to pay the $29 fee and sign the authorization to begin the process for his form. HIM has delivered the form to Dr. Thompson Caul box to be completed. AO 02/06/21

## 2021-02-06 NOTE — Telephone Encounter (Signed)
Pt in today to drop off short term disability paperwork.  He states that yesterday he was out to lunch at [employer] with his sister and he had a sudden onset of what the Neurologist felt was vertigo.  He became diaphoretic and flush, room spinning and he vomited.  States his "eyes spaced out".  Sister took him home and spinning continued for another 3 hours.  No issues so far today.  States when driving over to the office he felt like he was starting to get dizzy a little but it didn't actually happen.  Advised speaking with Neuro was appropriate when this happens.  Pt was concerned this may be a heart issue.  Advised not likely but I will send to Dr. Tamala Julian and see if he has any further recommendations.

## 2021-02-08 NOTE — Telephone Encounter (Signed)
Agree not cardiac.

## 2021-02-12 ENCOUNTER — Other Ambulatory Visit: Payer: Self-pay | Admitting: Internal Medicine

## 2021-02-12 NOTE — Telephone Encounter (Signed)
HIM received the completed form back from the nurse. The form has been faxed via Epic to [employer] as requested. AO 02/12/21

## 2021-02-18 NOTE — Progress Notes (Signed)
Virtual Visit via Video Note  I connected with Dalton [name] on 02/19/21 at  4:30 PM EDT by a video enabled telemedicine application and verified that I am speaking with the correct person using two identifiers.  Location: Patient: car Provider: office Persons participated in the visit- patient, provider    I discussed the limitations of evaluation and management by telemedicine and the availability of in person appointments. The patient expressed understanding and agreed to proceed.    I discussed the assessment and treatment plan with the patient. The patient was provided an opportunity to ask questions and all were answered. The patient agreed with the plan and demonstrated an understanding of the instructions.   The patient was advised to call back or seek an in-person evaluation if the symptoms worsen or if the condition fails to improve as anticipated.  I provided 21 minutes of non-face-to-face time during this encounter.   [name], MD    [facility] MD/PA/NP OP Progress Note  02/19/2021 5:12 PM Dalton [name]  MRN:  [id number]  Chief Complaint:  Chief Complaint   Trauma; Follow-up    HPI:  This is a follow-up appointment for depression and PTSD.  He states that he was told by cardiac surgeon that he can return to work next month.  He is not sure if he can return.  He feels anxious as he has been not working for the past 6 months.  He did not have any physical therapy either.  He is very concerned about financial strain.  He also notices that he has been angry easily and confrontational.  His wife states that he has been little patience with his wife.  Although he stays in the house most of the time due to concern of financial strain, he agrees to go outside at times.  He has occasional insomnia.  He feels fatigue.  He has occasional mild anhedonia, feeling depressed.  He has fair concentration.  He denies significant change in weight or appetite.  He denies SI.  He does  not recall dreams.  He has flashback when he hears helicopter.  He has hypervigilance.  He denies physical violence.  He is willing to try higher dose of quetiapine.    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 He was in [place] after 911, no combat experience Support: wife Household: [name]/his wife Marital status: married with his wife of six years Number of children: 2 daughters, age 44, 17, grandchild, 69 in march,   Visit Diagnosis:    ICD-10-CM   1. PTSD (post-traumatic stress disorder)  F43.10     2. Moderate episode of recurrent major depressive disorder ([place])  F33.1       Past Psychiatric History: Please see initial evaluation for full details. I have reviewed the history. No updates at this time.     Past Medical History:  Past Medical History:  Diagnosis Date   Aortic valve stenosis    a. Bicuspid AV - 2D Echo 06/2014 - mod AS, mild AI, mildly dilated aortic root.   Arthritis    Neck   CAD (coronary artery disease)    Carotid artery disease ([place])    a.  Mild by duplex 05/2015 - 1-39% BICA. Repeat due 05/2017.   Depression    Diabetes mellitus without complication ([place])    Dilated aortic root ([place])    a. By echo 06/2014.   Dyspnea    with exercion   Dysrhythmia    Atrial Fibrilation   Environmental allergies    Headache    MIgraines- rare   Heart murmur    Hypercholesterolemia    Hypertension    Obesity (BMI 30-39.9) 04/26/2018   PTSD (post-traumatic stress disorder)    Sleep apnea    CPAP   SVT (supraventricular tachycardia) ([place])    a. h/o SVT - Ablation done 2013.    Past Surgical History:  Procedure Laterality Date   AORTIC VALVE REPLACEMENT N/A 10/04/2020   Procedure: AORTIC VALVE REPLACEMENT  (AVR);  Surgeon: Gaye Pollack, MD;  Location: [place];  Service: Open Heart Surgery;  Laterality: N/A;   CARDIAC ELECTROPHYSIOLOGY STUDY AND ABLATION  08/21/2011   COLONOSCOPY WITH PROPOFOL N/A 06/26/2017   Procedure: COLONOSCOPY WITH PROPOFOL;  Surgeon: Manya Silvas, MD;  Location[facility] ENDOSCOPY;  Service: Endoscopy;  Laterality: N/A;   ESOPHAGOGASTRODUODENOSCOPY (EGD) WITH PROPOFOL N/A 06/26/2017   Procedure: ESOPHAGOGASTRODUODENOSCOPY (EGD) WITH PROPOFOL;  Surgeon: Manya Silvas, MD;  Location: [facility] ENDOSCOPY;  Service: Endoscopy;  Laterality: N/A;   MAZE N/A 10/04/2020   Procedure: MAZE;  Surgeon: Gaye Pollack, MD;  Location: [place];  Service: Open Heart Surgery;  Laterality: N/A;   RIGHT/LEFT HEART CATH AND CORONARY ANGIOGRAPHY N/A 11/16/2019   Procedure: RIGHT/LEFT HEART CATH AND CORONARY ANGIOGRAPHY;  Surgeon: [name], MD;  Location: [place] CV LAB;  Service: Cardiovascular;  Laterality: N/A;   SEPTOPLASTY N/A 03/22/2015   Procedure: SEPTOPLASTY  WITH RIGHT INFERIOR TURBINATE REDUCTION;  Surgeon: Margaretha Sheffield, MD;  Location: [place];  Service: ENT;  Laterality: N/A;   SUPRAVENTRICULAR TACHYCARDIA ABLATION N/A 08/21/2011   Procedure: SUPRAVENTRICULAR TACHYCARDIA ABLATION;  Surgeon: Evans Lance, MD;  Location: [facility] CATH LAB;  Service: Cardiovascular;  Laterality: N/A;   surgery for non descending testicle     TEE WITHOUT CARDIOVERSION N/A 11/16/2019   Procedure: TRANSESOPHAGEAL ECHOCARDIOGRAM (TEE);  Surgeon: Geralynn Rile, MD;  Location: [place];  Service: Cardiovascular;  Laterality: N/A;   TEE WITHOUT CARDIOVERSION N/A 10/04/2020   Procedure: TRANSESOPHAGEAL ECHOCARDIOGRAM (TEE);  Surgeon: Gaye Pollack, MD;  Location: [place];  Service: Open Heart Surgery;  Laterality: N/A;   TONSILLECTOMY      Family Psychiatric History: Please see initial evaluation for full details. I have reviewed the history. No updates at this time.     Family  History:  Family History  Problem Relation Age of Onset   Diabetes Other    Cancer Other    Breast cancer Maternal Grandmother    Cancer Maternal Grandfather    Aortic stenosis Maternal Grandfather     Social History:  Social History   Socioeconomic History   Marital status: Married    Spouse name: Not on file   Number of children: 2   Years of education: Not on file   Highest education level: Not on file  Occupational History   Occupation: truck Education administrator: [employer]  Tobacco Use   Smoking status: Former    Types: Cigarettes    Quit date: 11/04/1988    Years since quitting: 32.3   Smokeless tobacco: Never  Vaping Use   Vaping Use: Never used  Substance and Sexual Activity  Alcohol use: Yes    Comment: occasionally   Drug use: Not Currently    Comment: many years ago in highschool   Sexual activity: Not Currently  Other Topics Concern   Not on file  Social History Narrative   Very rare exercise   Social Determinants of Health   Financial Resource Strain: Low Risk    Difficulty of Paying Living Expenses: Not hard at all  Food Insecurity: Not on file  Transportation Needs: Not on file  Physical Activity: Not on file  Stress: Not on file  Social Connections: Not on file    Allergies: No Known Allergies  Metabolic Disorder Labs: Lab Results  Component Value Date   HGBA1C 6.1 01/24/2021   MPG 128.37 09/20/2020   No results found for: PROLACTIN Lab Results  Component Value Date   CHOL 137 04/26/2020   TRIG 136.0 04/26/2020   HDL 35.90 (L) 04/26/2020   CHOLHDL 4 04/26/2020   VLDL 27.2 04/26/2020   LDLCALC 74 04/26/2020   LDLCALC 84 12/26/2019   Lab Results  Component Value Date   TSH 1.22 06/09/2019   TSH 1.56 05/14/2018    Therapeutic Level Labs: No results found for: LITHIUM No results found for: VALPROATE No components found for:  CBMZ  Current Medications: Current Outpatient Medications  Medication Sig Dispense Refill    acetaminophen (TYLENOL) 500 MG tablet Take 1,000 mg by mouth every 6 (six) hours as needed for moderate pain or headache. (Patient not taking: Reported on 01/28/2021)     apixaban (ELIQUIS) 5 MG TABS tablet Take 1 tablet (5 mg total) by mouth 2 (two) times daily. 180 tablet 3   atorvastatin (LIPITOR) 40 MG tablet Take 1 tablet (40 mg total) by mouth daily. 90 tablet 3   [START ON 03/11/2021] buPROPion (WELLBUTRIN XL) 150 MG 24 hr tablet Take 1 tablet (150 mg total) by mouth daily. 30 tablet 1   cetirizine (ZYRTEC) 10 MG tablet Take 10 mg by mouth 2 (two) times daily.      citalopram (CELEXA) 40 MG tablet Take 40 mg by mouth every morning.     CONTOUR NEXT TEST test strip USE AS INSTRUCTED TO CHECK SUGARS TWICE A DAY 100 strip 1   digoxin (LANOXIN) 0.125 MG tablet Take 0.5 tablets (0.0625 mg total) by mouth daily. 45 tablet 3   FARXIGA 5 MG TABS tablet TAKE 1 TABLET(5 MG) BY MOUTH DAILY BEFORE AND BREAKFAST 30 tablet 2   fluticasone (FLONASE) 50 MCG/ACT nasal spray Place 2 sprays into both nostrils daily as needed for allergies or rhinitis.     Lancets (ONETOUCH DELICA PLUS [id number]) MISC USE TWICE DAILY AS DIRECTED 100 each 7   metFORMIN (GLUCOPHAGE) 1000 MG tablet TAKE 1 TABLET BY MOUTH TWICE DAILY 180 tablet 0   metFORMIN (GLUCOPHAGE) 500 MG tablet Take 1 tablet (500 mg total) by mouth 2 (two) times daily. 180 tablet 1   metoprolol succinate (TOPROL XL) 25 MG 24 hr tablet Take 1 tablet (25 mg total) by mouth daily. 90 tablet 3   NON FORMULARY as directed. CPAP     OZEMPIC, 0.25 OR 0.5 MG/DOSE, 2 MG/1.5ML SOPN INJECT 0.25 MG WEEKLY FOR 4 WEEKS THEN INCREASE TO 0.5 MG WEEKLY 4.5 mL 1   QUEtiapine (SEROQUEL) 50 MG tablet Take 1 tablet (50 mg total) by mouth at bedtime. 30 tablet 1   sacubitril-valsartan (ENTRESTO) 24-26 MG Take 1 tablet by mouth 2 (two) times daily. 60 tablet 11   No current facility-administered medications  for this visit.     Musculoskeletal: Strength & Muscle Tone:  N/A Gait  & Station:  N/A Patient leans: N/A  Psychiatric Specialty Exam: Review of Systems  Psychiatric/Behavioral:  Positive for dysphoric mood and sleep disturbance. Negative for agitation, behavioral problems, confusion, decreased concentration, hallucinations, self-injury and suicidal ideas. The patient is nervous/anxious. The patient is not hyperactive.   All other systems reviewed and are negative.  There were no vitals taken for this visit.There is no height or weight on file to calculate BMI.  General Appearance: Fairly Groomed  Eye Contact:  Good  Speech:  Clear and Coherent  Volume:  Normal  Mood:  Irritable  Affect:  Appropriate, Congruent, and euthymic  Thought Process:  Coherent  Orientation:  Full (Time, Place, and Person)  Thought Content: Logical   Suicidal Thoughts:  No  Homicidal Thoughts:  No  Memory:  Immediate;   Good  Judgement:  Good  Insight:  Good  Psychomotor Activity:  Normal  Concentration:  Concentration: Good and Attention Span: Good  Recall:  Good  Fund of Knowledge: Good  Language: Good  Akathisia:  No  Handed:  Right  AIMS (if indicated): not done  Assets:  Communication Skills Desire for Improvement  ADL's:  Intact  Cognition: WNL  Sleep:  Fair   Screenings: GAD-7    Health and safety inspector from 07/27/2020 in [place]  Total GAD-7 Score 13      PHQ2-9    Flowsheet Row Counselor from 01/28/2021 in [place] from 12/18/2020 in [place] from 07/27/2020 in [place] from 03/29/2019 in [place] Office Visit from 07/05/2018 in [place]  PHQ-2 Total Score '4 4 3 '$ 0 0  PHQ-9 Total Score '20 17 12 '$ 0 4      Flowsheet Row Counselor from 01/28/2021 in [place] from 12/18/2020 in [place]  Admission (Discharged) from 10/04/2020 in [facility] SURGICAL [facility]  C-SSRS RISK CATEGORY Low Risk No Risk No Risk        Assessment and Plan:  SKYELAR GOCHANOUR is a 55 y.o. 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   1. PTSD (post-traumatic stress disorder) 2. Moderate episode of recurrent major depressive disorder ([place]) He reports PTSD symptoms with prominent irritability, and depressive symptoms in the context of recovering from recent cardiac surgery. Other psychosocial stressors includes financial strain, history of being in [employer], and being denied of service connection by [place]. will uptitrate quetiapine to optimize treatment for PTSD and depression.  Discussed potential risk of metabolic side effect and EPS.  Will continue citalopram to target PTSD and depression.  Will continue bupropion to target depression.    # Parasomnia Unchanged. He reports a history of parasomnia.  Per chart review, he did have a home sleep study in last year, and was recommended for further evaluation.  He was advised again to contact his provider.    Plan  1. Continue Citalopram 40 mg daily 2. Continue bupropion 150 mg daily 3. Increase quetiapine 50 mg at night - monitor drowsiness (QTc 405 msec 10/2020) 5. Please contact your provider for your sleep issues/further  evaluation  6. Next appointment: 10/11 at 9 AM, video  [phone] - send link to this number.  7. He would like the record to be sent to [place]. The front desk to contact him to obtain consent to release information.    Past trials of medication: citalopram, lamotrigine, quetiapine, Trazodone,    The patient demonstrates the  following risk factors for suicide: Chronic risk factors for suicide include: psychiatric disorder of depression, PTSD. Acute risk factors for suicide include: N/A. Protective factors for this patient include: positive social support, responsibility to others (children, family), coping skills and hope for the future. Considering these factors, the overall suicide risk at this point appears to be low. Patient is appropriate for outpatient follow up.                [name], MD 02/19/2021, 5:12 PM

## 2021-02-19 ENCOUNTER — Telehealth (INDEPENDENT_AMBULATORY_CARE_PROVIDER_SITE_OTHER): Payer: 59 | Admitting: Psychiatry

## 2021-02-19 ENCOUNTER — Encounter: Payer: Self-pay | Admitting: Psychiatry

## 2021-02-19 ENCOUNTER — Other Ambulatory Visit: Payer: Self-pay

## 2021-02-19 DIAGNOSIS — F4310 Post-traumatic stress disorder, unspecified: Secondary | ICD-10-CM | POA: Diagnosis not present

## 2021-02-19 DIAGNOSIS — F331 Major depressive disorder, recurrent, moderate: Secondary | ICD-10-CM | POA: Diagnosis not present

## 2021-02-19 MED ORDER — BUPROPION HCL ER (XL) 150 MG PO TB24: 150.0000 mg | ORAL_TABLET | Freq: Every day | ORAL | 1 refills | Status: DC

## 2021-02-19 MED ORDER — QUETIAPINE FUMARATE 50 MG PO TABS: 50.0000 mg | ORAL_TABLET | Freq: Every day | ORAL | 1 refills | Status: DC

## 2021-02-19 NOTE — Patient Instructions (Signed)
1. Continue Citalopram 40 mg daily 2. Continue bupropion 150 mg daily 3. Increase quetiapine 50 mg at night 5. Please contact your provider for your sleep issues/further evaluation  6. Next appointment: 10/11 at 9 AM

## 2021-02-28 ENCOUNTER — Telehealth: Payer: Self-pay | Admitting: Interventional Cardiology

## 2021-02-28 NOTE — Telephone Encounter (Signed)
Case Manager Myriam Jacobson faxed paperwork to the office 02/26/21 regariding a request for office visit notes for this patient. She was calling to check to see if the office received it. If the office did not receive the fax, please reach out to her at  [phone]

## 2021-02-28 NOTE — Telephone Encounter (Signed)
Paperwork was received.  Plan to complete when Dr. Tamala Julian is back in the office.

## 2021-03-01 ENCOUNTER — Telehealth: Payer: Self-pay | Admitting: Interventional Cardiology

## 2021-03-01 NOTE — Telephone Encounter (Signed)
Form completed by Dr. Tamala Julian and faxed to [facility] on 03/01/2021. JMM 03/01/2021

## 2021-03-04 ENCOUNTER — Telehealth: Payer: Self-pay
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
## 2021-03-04 NOTE — Telephone Encounter (Signed)
Patient has been rescheduled.

## 2021-03-04 NOTE — Telephone Encounter (Signed)
Pt needs to cancel phone appt with you on 03/26/21. He wants to know if you can do it before 03/25/21-that is when he starts back at work. Thanks

## 2021-03-04 NOTE — Patient Instructions (Signed)
Below is our plan:  We will refer you to physical therapy. Please let me know if you have not heard back form PT by next Tuesday. Please call to reschedule follow up with PCP. Follow up with Cardiology in November. Continue close follow up with psychiatry and psychology.   Please make sure you are staying well hydrated. I recommend 50-60 ounces daily. Well balanced diet and regular exercise encouraged. Consistent sleep schedule with 6-8 hours recommended.   Please continue follow up with care team as directed.   Follow up with me in 3-4 months.   You may receive a survey regarding today's visit. I encourage you to leave honest feed back as I do use this information to improve patient care. Thank you for seeing me today!

## 2021-03-05 ENCOUNTER — Encounter: Payer: Self-pay | Admitting: Family Medicine

## 2021-03-05 ENCOUNTER — Ambulatory Visit (INDEPENDENT_AMBULATORY_CARE_PROVIDER_SITE_OTHER): Payer: 59 | Admitting: Family Medicine

## 2021-03-05 ENCOUNTER — Telehealth (HOSPITAL_COMMUNITY): Payer: Self-pay | Admitting: Psychiatry

## 2021-03-05 ENCOUNTER — Other Ambulatory Visit: Payer: Self-pay

## 2021-03-05 ENCOUNTER — Ambulatory Visit (INDEPENDENT_AMBULATORY_CARE_PROVIDER_SITE_OTHER): Payer: 59 | Admitting: Licensed Clinical Social Worker

## 2021-03-05 VITALS — BP 110/72 | HR 53 | Ht 73.0 in | Wt 256.0 lb

## 2021-03-05 DIAGNOSIS — R29818 Other symptoms and signs involving the nervous system: Secondary | ICD-10-CM | POA: Diagnosis not present

## 2021-03-05 DIAGNOSIS — F4310 Post-traumatic stress disorder, unspecified: Secondary | ICD-10-CM

## 2021-03-05 DIAGNOSIS — Z952 Presence of prosthetic heart valve: Secondary | ICD-10-CM | POA: Diagnosis not present

## 2021-03-05 DIAGNOSIS — R45851 Suicidal ideations: Secondary | ICD-10-CM

## 2021-03-05 DIAGNOSIS — R5381 Other malaise: Secondary | ICD-10-CM | POA: Diagnosis not present

## 2021-03-05 NOTE — Progress Notes (Signed)
Virtual Visit via Video Note  I connected with [name] on 03/05/21 at  2:00 PM EDT by a video enabled telemedicine application and verified that I am speaking with the correct person using two identifiers.  Location: Patient: home Provider: remote office [place], [place])   I discussed the limitations of evaluation and management by telemedicine and the availability of in person appointments. The patient expressed understanding and agreed to proceed.  I discussed the assessment and treatment plan with the patient. The patient was provided an opportunity to ask questions and all were answered. The patient agreed with the plan and demonstrated an understanding of the instructions.   The patient was advised to call back or seek an in-person evaluation if the symptoms worsen or if the condition fails to improve as anticipated.  I provided 60 minutes of non-face-to-face time during this encounter.   Dalton [name] R Dalton Kerins, LCSW   THERAPIST PROGRESS NOTE  Session Time: 2-3p  Participation Level: Active  Behavioral Response: Neat and Well GroomedAlert  Type of Therapy: Individual Therapy  Treatment Goals addressed: Coping and Diagnosis: depression  Interventions: CBT and Other: Crisis: local crisis resources  Summary: Dalton [name] is a 55 y.o. male who presents with continuing symptoms related to depression. Pt reports that mood has been overall low recently and that he is having more insomnia since last session. Pt reports that he has been more irritable and angry recently.  Allowed pt safe space to explore and express thoughts and feelings associated with recent external stressors and life events. Pt reports that he feels that doctors are not listening to him when he goes in for visits and explains his situation and how he is feeling to them, which pt finds frustrating. Pt is stated to return to work mid-September, and pt feels that this could be a good thing for him. "We are  in a bind financially". Discussed pts military history and thoughts/feelings pt has about not receiving any military benefits. Pt states he is currently contesting this. Discussed VA PTSD support groups if pt qualifies. Pt admits that he has had some SI--pt states that he would not follow through with anything because his family members are protective factor for him. Pt does state that being out of work and not being able to provide for his family is triggering significant stress. Encouraged physical activity, relaxation strategies, and referred for MHIOP. Continued recommendations are as follows: self care behaviors, positive social engagements, focusing on overall work/home/life balance, and focusing on positive physical and emotional wellness.    Suicidal/Homicidal: Yes. 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 Reviewed local crisis resources including: calling 911, local ED, [place]. Requested pt lock up extra medication and secure any unsecured firearms. Pt reflected understanding.   Therapist Response: Pt reporting increase/escalation of symptoms which is reflective of fluctuating/intermittent progress. Reviewed local crisis resources w/ pt and referred for MHIOP. Scheduled another OPT in 4 weeks and encouraged pt to call back if another appt needed sooner--will place on call list.   Plan: Return again in 4 weeks. Refer MHIOP  Diagnosis: Axis I: PTSD, suicidal ideation    Axis II: No diagnosis    Dalton Bo Chantele Corado, LCSW 03/05/2021

## 2021-03-05 NOTE — Telephone Encounter (Signed)
D:  Christina Hussami, LCSW referred pt to MH-IOP.  A:  Placed call and oriented pt.  Pt states he would like to contact his insurance company to see how much they will cover.  Encouraged pt to call the case manager back once he talks to his insurance co.  Inform Christina.  R:  Pt receptive.

## 2021-03-12 NOTE — Telephone Encounter (Signed)
Patient spoke with Gae Bon - he is bringing his chip in to get a download as his device uses 3G and airview now requires 5G to get uploads.

## 2021-03-17 NOTE — Telephone Encounter (Signed)
To whom it may concern,  Mr. Dalton [name] has undergone successful aortic valve replacement surgery. He is now 6 months post surgery and well healed. He has completed cardiac rehabilitation and is now able to drive commercial vehicles.  If questions or concerns, please notify us.  Sincerely,   Illene Labrador, III, MD

## 2021-03-17 NOTE — Progress Notes (Signed)
Office Visit Note  Patient: Dalton [name]             Date of Birth: 1966/04/16     MRN: [id number]             PCP: Einar Pheasant, MD Referring: Leone Haven, MD Visit Date: 03/18/2021 Occupation: Games developer  Subjective:  New Patient (Initial Visit) (Patient complains of low back and bilateral knee pain. Patient also complains of periodic rashes that seem to be well controlled right now.)   History of Present Illness: Dalton [name] is a 55 y.o. male here for positive ANA and skin rashes.  He has a history of chronic low back pain and some bilateral knee pain he attributes to wear and tear from the [employer] and a long career as a Counsellor.  However since earlier this year he developed a completely new symptom with skin rashes particularly involving his torso and bilateral arms particularly on flexor surfaces.  He also started developing erythematous rash over the center of the face and cheeks.  He did start multiple new cardiac medications but states his change in symptoms proceeded his going for open heart surgery in March for replacement of bicuspid aortic valve with stenosis and insufficiency contributing to combined systolic and diastolic heart failure.  He has noticed some increase in joint pains and also new development of some chronic right upper arm pain that he localizes to the lateral upper arm exacerbated with reaching overhead or behind his back or if lying on his right side at night.  Due to increase in joint pains and multiple rashes labs were checked for an inflammatory condition showing positive ANA in primary care clinic.  No specific treatments were started symptoms have decreased in intensity compared to his initial examination but they do apparently come and go somewhat without specific identified provocation.  His wife already tried changing of detergents and other cleaning agents with no noticeable difference. He has a family history of  some type of arthritis with chronic joint deformities of the hands on his mother side but he is not sure of any previous inflammatory disease history.   Labs reviewed 01/2021 ANA 1:160 homogenous dsDNA neg CBC unremarkable CMP unremarkable  Activities of Daily Living:  Patient reports morning stiffness for 24 hours.   Patient Reports nocturnal pain.  Difficulty dressing/grooming: Reports Difficulty climbing stairs: Reports Difficulty getting out of chair: Reports Difficulty using hands for taps, buttons, cutlery, and/or writing: Reports  Review of Systems  Constitutional:  Positive for fatigue.  HENT:  Negative for mouth sores, mouth dryness and nose dryness.   Eyes:  Positive for visual disturbance. Negative for pain, itching and dryness.  Respiratory:  Positive for shortness of breath. Negative for cough, hemoptysis and difficulty breathing.   Cardiovascular:  Positive for irregular heartbeat. Negative for chest pain, palpitations and swelling in legs/feet.  Gastrointestinal:  Negative for abdominal pain, blood in stool, constipation and diarrhea.  Endocrine: Negative for increased urination.  Genitourinary:  Negative for painful urination.  Musculoskeletal:  Positive for joint pain, joint pain and morning stiffness. Negative for joint swelling, myalgias, muscle weakness, muscle tenderness and myalgias.  Skin:  Positive for rash. Negative for color change and redness.  Allergic/Immunologic: Negative for susceptible to infections.  Neurological:  Positive for parasthesias and weakness. Negative for dizziness, numbness, headaches and memory loss.  Hematological:  Negative for swollen glands.  Psychiatric/Behavioral:  Positive for sleep disturbance. Negative for confusion.    [place]  History:  Patient Active Problem List   Diagnosis Date Noted   Rash and other nonspecific skin eruption 03/18/2021   Pain in right shoulder 03/18/2021   Low back pain 03/18/2021   Bilateral knee pain  03/18/2021   Positive ANA (antinuclear antibody) 03/18/2021   Malar rash 01/25/2021   S/P AVR 10/04/2020   PTSD (post-traumatic stress disorder) 04/28/2020   Thyroid nodule 04/28/2020   Healthcare maintenance 12/26/2019   Chronic combined systolic and diastolic heart failure ([place])    Abdominal pain 12/05/2018   Type 2 diabetes mellitus with hyperglycemia ([place]) 12/05/2018   Portal hypertensive gastropathy ([place]) 09/16/2018   Abscess of left groin 07/23/2018   Occipital headache 04/30/2018   Neck pain 04/30/2018   Cervical radiculopathy 04/30/2018   Obesity, diabetes, and hypertension syndrome ([place]) 04/26/2018   History of colon polyps 07/05/2017   Chronic anticoagulation 01/09/2017   Low testosterone level in male 09/21/2016   Atrial fibrillation ([place]) 05/23/2016   Sleep apnea 05/11/2016   Nasal congestion 08/26/2015   Environmental allergies 07/22/2015   Other specified cardiac arrhythmias 07/01/2014   Aortic valve disorder 07/01/2014   Essential hypertension 06/08/2014   Hyperlipidemia 06/08/2014   Aortic valve stenosis     Past Medical History:  Diagnosis Date   Aortic valve stenosis    a. Bicuspid AV - 2D Echo 06/2014 - mod AS, mild AI, mildly dilated aortic root.   Arthritis    Neck   CAD (coronary artery disease)    Carotid artery disease ([place])    a. Mild by duplex 05/2015 - 1-39% BICA. Repeat due 05/2017.   Depression    Diabetes mellitus without complication ([place])    Dilated aortic root ([place])    a. By echo 06/2014.   Dyspnea    with exercion   Dysrhythmia    Atrial Fibrilation   Environmental allergies    Headache    MIgraines- rare   Heart murmur    Hypercholesterolemia    Hypertension    Obesity (BMI 30-39.9) 04/26/2018   PTSD (post-traumatic stress disorder)    Sleep apnea    CPAP   SVT (supraventricular tachycardia) ([place])    a. h/o SVT - Ablation done 2013.    Family History  Problem Relation Age of Onset   Diabetes Mother    Heart disease  Mother    Arthritis Mother    Heart attack Father    Kidney disease Father    Cancer Father    Breast cancer Maternal Grandmother    Cancer Maternal Grandfather    Aortic stenosis Maternal Grandfather    Past Surgical History:  Procedure Laterality Date   AORTIC VALVE REPLACEMENT N/A 10/04/2020   Procedure: AORTIC VALVE REPLACEMENT (AVR);  Surgeon: Gaye Pollack, MD;  Location: [place];  Service: Open Heart Surgery;  Laterality: N/A;   CARDIAC ELECTROPHYSIOLOGY STUDY AND ABLATION  08/21/2011   COLONOSCOPY WITH PROPOFOL N/A 06/26/2017   Procedure: COLONOSCOPY WITH PROPOFOL;  Surgeon: Manya Silvas, MD;  [facility] ENDOSCOPY;  Service: Endoscopy;  Laterality: N/A;   ESOPHAGOGASTRODUODENOSCOPY (EGD) WITH PROPOFOL N/A 06/26/2017   Procedure: ESOPHAGOGASTRODUODENOSCOPY (EGD) WITH PROPOFOL;  Surgeon: Manya Silvas, MD;  Location: [facility] ENDOSCOPY;  Service: Endoscopy;  Laterality: N/A;   MAZE N/A 10/04/2020   Procedure: MAZE;  Surgeon: Gaye Pollack, MD;  Location: [place];  Service: Open Heart Surgery;  Laterality: N/A;   RIGHT/LEFT HEART CATH AND CORONARY ANGIOGRAPHY N/A 11/16/2019   Procedure: RIGHT/LEFT HEART CATH AND CORONARY ANGIOGRAPHY;  Surgeon: [name], MD;  Location: [place] CV LAB;  Service: Cardiovascular;  Laterality: N/A;   SEPTOPLASTY N/A 03/22/2015   Procedure: SEPTOPLASTY  WITH RIGHT INFERIOR TURBINATE REDUCTION;  Surgeon: Margaretha Sheffield, MD;  Location: [place];  Service: ENT;  Laterality: N/A;   SUPRAVENTRICULAR TACHYCARDIA ABLATION N/A 08/21/2011   Procedure: SUPRAVENTRICULAR TACHYCARDIA ABLATION;  Surgeon: Evans Lance, MD;  Location: [facility] CATH LAB;  Service: Cardiovascular;  Laterality: N/A;   surgery for non descending testicle     TEE WITHOUT CARDIOVERSION N/A 11/16/2019   Procedure: TRANSESOPHAGEAL ECHOCARDIOGRAM (TEE);  Surgeon: Geralynn Rile, MD;  Location: [place];  Service: Cardiovascular;  Laterality: N/A;   TEE WITHOUT  CARDIOVERSION N/A 10/04/2020   Procedure: TRANSESOPHAGEAL ECHOCARDIOGRAM (TEE);  Surgeon: Gaye Pollack, MD;  Location: [place];  Service: Open Heart Surgery;  Laterality: N/A;   TONSILLECTOMY     Social History   Social History Narrative   Very rare exercise   Immunization History  Administered Date(s) Administered   Influenza Inj Mdck Quad Pf 04/30/2019   Influenza,inj,Quad PF,6+ Mos 09/17/2018   Influenza,inj,quad, With Preservative 04/30/2019   Janssen (J&J) SARS-COV-2 Vaccination 10/15/2019     Objective: Vital Signs: BP 109/68 (BP Location: Right Arm, Patient Position: Sitting, Cuff Size: Large)   Pulse 81   Ht 6' (1.829 m)   Wt 258 lb (117 kg)   BMI 34.99 kg/m    Physical Exam Constitutional:      Appearance: He is obese.  HENT:     Mouth/Throat:     Mouth: Mucous membranes are moist.     Pharynx: Oropharynx is clear.  Eyes:     Conjunctiva/sclera: Conjunctivae normal.  Cardiovascular:     Rate and Rhythm: Normal rate and regular rhythm.  Pulmonary:     Effort: Pulmonary effort is normal.     Breath sounds: Normal breath sounds.  Lymphadenopathy:     Cervical: No cervical adenopathy.  Skin:    General: Skin is warm and dry.     Comments: Very faint erythema present on the cheeks no papules no clear borders Faint erythema in left antecubital fossa and elbow flexor surface  Neurological:     Mental Status: He is alert.     Deep Tendon Reflexes: Reflexes normal.  Psychiatric:        Mood and Affect: Mood normal.     Musculoskeletal Exam:  Neck full ROM no tenderness Right shoulder pain with overhead abduction and reaching behind low back, external range of motion limited when abducted, no focal tenderness to pressure and strength normal, left shoulder normal Elbows full ROM no tenderness or swelling Wrists full ROM no tenderness or swelling Fingers full ROM no tenderness or swelling, except left 5th finger fixed PIP flexion deformity Mild low back midline  and left paraspinal muscle tenderness Knees full ROM, no tenderness or swelling, patellofemoral crepitus present b/l Left ankle lateral skin changes and slightly decreased ROM compared to right, no tenderness or swelling    Investigation: No additional findings.  Imaging: No results found.  Recent Labs: Lab Results  Component Value Date   WBC 6.3 01/24/2021   HGB 15.4 01/24/2021   PLT 163.0 01/24/2021   NA 137 01/24/2021   K 4.6 01/24/2021   CL 99 01/24/2021   CO2 26 01/24/2021   GLUCOSE 158 (H) 01/24/2021   BUN 13 01/24/2021   CREATININE 1.12 01/24/2021   BILITOT 0.7 01/24/2021   ALKPHOS 98 01/24/2021   AST 13  01/24/2021   ALT 21 01/24/2021   PROT 6.6 01/24/2021   ALBUMIN 4.6 01/24/2021   CALCIUM 9.7 01/24/2021   GFRAA 92 01/17/2020    Speciality Comments: No specialty comments available.  Procedures:  No procedures performed Allergies: Patient has no known allergies.   Assessment / Plan:     Visit Diagnoses: Positive ANA (antinuclear antibody) - Plan: Sedimentation rate, C3 and C4, Anti-Smith antibody, Sjogrens syndrome-A extractable nuclear antibody, RNP Antibody, Histone antibodies, IgG, blood  Positive ANA moderate titer symptoms of new rashes and increased joint pains no other specific findings. Will check additional serology including Smith, RNP, histone, SSA Abs and complements and sedimentation rate for other evidence of inflammatory disease.  Malar rash Rash and other nonspecific skin eruption  Skin rashes appear very minimal on exam today although have been waxing and waning previously. If serology is nonspecific may benefit from seeing dermatology or allergy clinic for more extensive evaluation.  Chronic right shoulder pain  Right shoulder pain seems most consistent with rotator cuff pathology with the location timing and character of pain.  Endpoints feel soft with guarding do not suspect severe joint arthritis as a cause at this time.  Recommend  first-line treatment would probably be exercise or therapy provided some range of motion and impingement exercise instructions here today.  Could benefit with referral to physical therapy if failure to improve or trial of local injection.  Shoulder pain symptoms appear consistent with  Chronic left-sided low back pain without sciatica Chronic pain of both knees  Back and knee pain seems consistent with degenerative joint disease there is some crepitus of the knees on exam but no tenderness or swelling and range of motion is preserved.  Some symptom worsening could be related to deconditioning with being out of work and the recent heart surgery.  Orders: Orders Placed This Encounter  Procedures   Sedimentation rate   C3 and C4   Anti-Smith antibody   Sjogrens syndrome-A extractable nuclear antibody   RNP Antibody   Histone antibodies, IgG, blood    No orders of the defined types were placed in this encounter.    Follow-Up Instructions: Return in about 2 weeks (around 04/01/2021) for New pt +ANA f/u 2wks.   Collier Salina, MD  Note - This record has been created using [employer].  Chart creation errors have been sought, but may not always  have been located. Such creation errors do not reflect on  the standard of medical care.

## 2021-03-18 ENCOUNTER — Encounter: Payer: Self-pay | Admitting: *Deleted

## 2021-03-18 ENCOUNTER — Other Ambulatory Visit: Payer: Self-pay | Admitting: Psychiatry

## 2021-03-18 ENCOUNTER — Other Ambulatory Visit: Payer: Self-pay

## 2021-03-18 ENCOUNTER — Encounter: Payer: Self-pay | Admitting: Internal Medicine

## 2021-03-18 ENCOUNTER — Telehealth: Payer: Self-pay

## 2021-03-18 ENCOUNTER — Ambulatory Visit (INDEPENDENT_AMBULATORY_CARE_PROVIDER_SITE_OTHER): Payer: 59 | Admitting: Internal Medicine

## 2021-03-18 VITALS — BP 109/68 | HR 81 | Ht 72.0 in | Wt 258.0 lb

## 2021-03-18 DIAGNOSIS — M25561 Pain in right knee: Secondary | ICD-10-CM | POA: Insufficient documentation

## 2021-03-18 DIAGNOSIS — R21 Rash and other nonspecific skin eruption: Secondary | ICD-10-CM | POA: Insufficient documentation

## 2021-03-18 DIAGNOSIS — R768 Other specified abnormal immunological findings in serum: Secondary | ICD-10-CM | POA: Diagnosis not present

## 2021-03-18 DIAGNOSIS — G8929 Other chronic pain: Secondary | ICD-10-CM

## 2021-03-18 DIAGNOSIS — M25562 Pain in left knee: Secondary | ICD-10-CM

## 2021-03-18 DIAGNOSIS — M545 Low back pain, unspecified: Secondary | ICD-10-CM | POA: Diagnosis not present

## 2021-03-18 DIAGNOSIS — M25511 Pain in right shoulder: Secondary | ICD-10-CM | POA: Insufficient documentation

## 2021-03-18 MED ORDER — CITALOPRAM HYDROBROMIDE 40 MG PO TABS: 40.0000 mg | ORAL_TABLET | Freq: Every morning | ORAL | 0 refills | Status: DC

## 2021-03-18 NOTE — Patient Instructions (Signed)
Antinuclear Antibody Test Why am I having this test? This is a test that is used to help diagnose systemic lupus erythematosus (SLE) and other autoimmune diseases. An autoimmune disease is a disease in which the body's own defense (immune)system attacks its organs. What is being tested? This test checks for antinuclear antibodies (ANA) in the blood. The presence of ANA is associated with several autoimmune diseases. It is seen in almost all patients with lupus. What kind of sample is taken? A blood sample is required for this test. It is usually collected by inserting a needle into a blood vessel. How are the results reported? Your test results will be reported as either positive or negative. A false-positive result can occur. A false positive is incorrect because it means that a condition is present when it is not. What do the results mean? A positive test result may mean that you have: Lupus. Other autoimmune diseases, such as rheumatoid arthritis, scleroderma, or Sjgren syndrome. Conditions that may cause a false-positive result include: Liver dysfunction. Myasthenia gravis. Infectious mononucleosis. Talk with your health care provider about what your results mean. Questions to ask your health care provider Ask your health care provider, or the department that is doing the test: When will my results be ready? How will I get my results? What are my treatment options? What other tests do I need? What are my next steps? Summary This is a test that is used to help diagnose systemic lupus erythematosus (SLE) and other autoimmune diseases. An autoimmune disease is a disease in which the body's own defense (immune)system attacks the body. This test checks for antinuclear antibodies (ANA) in the blood. The presence of ANA is associated with several autoimmune diseases. It is seen in almost all patients with lupus. Your test results will be reported as either positive or negative. Talk with  your health care provider about what your results mean.  Complement Assay Test Why am I having this test? Complement refers to a group of proteins that are part of the body's disease-fighting system (immune system). A complement assay test provides information about some or all of these proteins. You may have this test: To diagnose a lack, or deficiency, of certain complement proteins. Deficiencies can be passed from parent to child (inherited). To monitor an infection or autoimmune disease. If you have unexplained inflammation or swelling (edema). If you have bacterial infections again and again. What is being tested? This test can be used to measure: Total complement. This is the total number of protein complements in your blood. The number of each kind of complement in your blood. The nine main kinds of complement are labeled C1 through C9. Some of these complements, such as C3 and C4, are especially important and have many functions in the body. Depending on why you are having the test, your health care provider may test your total complement or only some individual complements, such as C3 and C4. The total complement assay test may be done before individual complements are tested. What kind of sample is taken? A blood sample is required for this test. It is usually collected by inserting a needle into a blood vessel. Tell a health care provider about: Any allergies you have. All medicines you are taking, including vitamins, herbs, eye drops, creams, and over-the-counter medicines. Any blood disorders you have. Any surgeries you have had. Any medical conditions you have. Whether you are pregnant or may be pregnant. How are the results reported? Your results will be reported  as a value that tells you how much complement is in your blood. This will be given as units per milliliter of blood (units/mL) or as milligrams per deciliter of blood (mg/dL). Your results may be reported as total  complement, or as individual complements, or both. Your health care provider will compare your results to normal ranges that were established after testing a large group of people (reference ranges). Reference ranges may vary among labs and hospitals. For this test, reference ranges for some of the most commonly measured complement assays may be: Total complement: 30-75 units/mL. C2: 1-4 mg/dL. C3: 75-175 mg/dL. C4: 22-45 units/mL. What do the results mean? Results within reference ranges are considered normal, which means you have a normal amount of complement in your blood. Results that are higher than the reference ranges may be caused by: Inflammatory disease. Heart attack. Cancer. Complement deficiencies, or results lower than the reference ranges, may be caused by: Certain inherited conditions. Autoimmune disease. Certain liver diseases. Malnutrition. Certain types of anemia that result in breakdown of red blood cells (hemolytic anemia). Talk with your health care provider about what your results mean. Questions to ask your health care provider Ask your health care provider, or the department that is doing the test: When will my results be ready? How will I get my results? What are my treatment options? What other tests do I need? What are my next steps? Summary Complement refers to a group of proteins that are part of the body's disease-fighting system (immune system). A complement assay test can provide information about some or all of these proteins. You may have a complement assay test to help diagnose a complement deficiency, and to monitor some infections or autoimmune disease. Talk with your health care provider about what your results mean.  Erythrocyte Sedimentation Rate Test Why am I having this test? The erythrocyte sedimentation rate (ESR) test is used to help find illnesses related to: Sudden (acute) or long-term (chronic) infections. Inflammation. The body's  disease-fighting system attacking healthy cells (autoimmune diseases). Cancer. Tissue death. If you have symptoms that may be related to any of these illnesses, your health care provider may do an ESR test before doing more specific tests. If you have an inflammatory immune disease, such as rheumatoid arthritis, you may have this test to help monitor your therapy. What is being tested? This test measures how long it takes for your red blood cells (erythrocytes) to settle in a solution over a certain amount of time (sedimentation rate). When you have an infection or inflammation, your red blood cells clump together and settle faster. The sedimentation rate provides information about how much inflammation is present in the body. What kind of sample is taken? A blood sample is required for this test. It is usually collected by inserting a needle into a blood vessel. How do I prepare for this test? Follow any instructions from your health care provider about changing or stopping your regular medicines. Tell a health care provider about: Any allergies you have. All medicines you are taking, including vitamins, herbs, eye drops, creams, and over-the-counter medicines. Any blood disorders you have. Any surgeries you have had. Any medical conditions you have, such as thyroid or kidney disease. Whether you are pregnant or may be pregnant. How are the results reported? Your results will be reported as a value that measures sedimentation rate in millimeters per hour (mm/hr). Your health care provider will compare your results to normal ranges that were established after testing a  large group of people (reference values). Reference values may vary among labs and hospitals. For this test, common reference values, which vary by age and gender, are: Newborn: 0-2 mm/hr. Child, up to puberty: 0-10 mm/hr. Male: Under 50 years: 0-20 mm/hr. 50-85 years: 0-30 mm/hr. Over 85 years: 0-42 mm/hr. Male: Under 50  years: 0-15 mm/hr. 50-85 years: 0-20 mm/hr. Over 85 years: 0-30 mm/hr. Certain conditions or medicines may cause ESR levels to be falsely lower or higher, such as: Pregnancy. Obesity. Steroids, birth control pills, and blood thinners. Thyroid or kidney disease. What do the results mean? Results that are within reference values are considered normal, meaning that the level of inflammation in your body is healthy. High ESR levels mean that there is inflammation in your body. You will have more tests to help make a diagnosis. Inflammation may result from many different conditions or injuries. Talk with your health care provider about what your results mean. Questions to ask your health care provider Ask your health care provider, or the department that is doing the test: When will my results be ready? How will I get my results? What are my treatment options? What other tests do I need? What are my next steps? Summary The erythrocyte sedimentation rate (ESR) test is used to help find illnesses associated with sudden (acute) or long-term (chronic) infections, inflammation, autoimmune diseases, cancer, or tissue death. If you have symptoms that may be related to any of these illnesses, your health care provider may do an ESR test before doing more specific tests. If you have an inflammatory immune disease, such as rheumatoid arthritis, you may have this test to help monitor your therapy. This test measures how long it takes for your red blood cells (erythrocytes) to settle in a solution over a certain amount of time (sedimentation rate). This provides information about how much inflammation is present in the body.

## 2021-03-18 NOTE — Telephone Encounter (Signed)
Ordered

## 2021-03-18 NOTE — Telephone Encounter (Signed)
received fax requesting a refill on the celexa

## 2021-03-19 ENCOUNTER — Encounter: Payer: Self-pay | Admitting: *Deleted

## 2021-03-21 ENCOUNTER — Ambulatory Visit: Payer: 59 | Admitting: Pharmacist

## 2021-03-21 DIAGNOSIS — E1165 Type 2 diabetes mellitus with hyperglycemia: Secondary | ICD-10-CM

## 2021-03-21 DIAGNOSIS — I10 Essential (primary) hypertension: Secondary | ICD-10-CM

## 2021-03-21 DIAGNOSIS — E785 Hyperlipidemia, unspecified: Secondary | ICD-10-CM

## 2021-03-21 LAB — ANTI-SMITH ANTIBODY: ENA SM Ab Ser-aCnc: <1 AI

## 2021-03-21 LAB — SEDIMENTATION RATE: Sed Rate: 2 mm/h (ref 0–20)

## 2021-03-21 LAB — RNP ANTIBODY: Ribonucleic Protein(ENA) Antibody, IgG: <1 AI

## 2021-03-21 LAB — C3 AND C4
C3 Complement: 154 mg/dL (ref 82–185)
C4 Complement: 29 mg/dL (ref 15–53)

## 2021-03-21 LAB — SJOGRENS SYNDROME-A EXTRACTABLE NUCLEAR ANTIBODY: SSA (Ro) (ENA) Antibody, IgG: <1 AI

## 2021-03-21 LAB — HISTONE ANTIBODIES, IGG, BLOOD: Histone Antibodies: <1 U (ref <1.0)

## 2021-03-21 MED ORDER — OZEMPIC (1 MG/DOSE) 4 MG/3ML ~~LOC~~ SOPN: 1.0000 mg | PEN_INJECTOR | SUBCUTANEOUS | 2 refills | Status: DC

## 2021-03-21 NOTE — Patient Instructions (Signed)
[name],   Increase Ozempic to 1 mg weekly. You can do two doses of 0.5 mg, once after the other, to complete your current supply.   Good luck with going back to work!  We recommend you get the influenza vaccine for this season.   We recommend you get the updated bivalent COVID-19 booster, at least 2 months after any prior doses. You may consider delaying a booster dose by 3 months from a prior episode of COVID-19 per the CDC.   You can find pharmacies that have this formulation in stock at [email or web address].   Take care,   Catie Darnelle Maffucci, PharmD [phone]  Visit Information  PATIENT GOALS:  Goals Addressed               This Visit's Progress     Patient Stated     Medication Monitoring (pt-stated)        Patient Goals/Self-Care Activities Over the next days, patient will:  - take medications as prescribed check glucose at least twice daily, document, and provide at future appointments         Patient verbalizes understanding of instructions provided today and agrees to view in [place].    Plan: Telephone follow up appointment with care management team member scheduled for:  ~ 6 weeks  Catie Darnelle Maffucci, PharmD, [place], [place] Clinical Pharmacist [employer] [facility] [employer] [phone]

## 2021-03-21 NOTE — Chronic Care Management (AMB) (Signed)
Care Management   Pharmacy Note  03/21/2021 Name: Dalton [name] MRN: [id number] DOB: [birth year]  Subjective: Dalton [name] is a 55 y.o. year old male who is a primary care patient of Einar Pheasant, MD. The Care Management team was consulted for assistance with care management and care coordination needs.    Engaged with patient by telephone for follow up visit in response to provider referral for pharmacy case management and/or care coordination services.   The patient was given information about Care Management services today including:  Care Management services includes personalized support from designated clinical staff supervised by the patient's primary care provider, including individualized plan of care and coordination with other care providers. 24/7 contact phone numbers for assistance for urgent and routine care needs. The patient may stop case management services at any time by phone call to the office staff.  Patient agreed to services and consent obtained.  Assessment:  Review of patient status, including review of consultants reports, laboratory and other test data, was performed as part of comprehensive evaluation and provision of chronic care management services.   SDOH (Social Determinants of Health) assessments and interventions performed:  SDOH Interventions    Flowsheet Row Most Recent Value  SDOH Interventions   Financial Strain Interventions Intervention Not Indicated        Objective:  Lab Results  Component Value Date   CREATININE 1.12 01/24/2021   CREATININE 1.01 11/13/2020   CREATININE 1.00 10/07/2020    Lab Results  Component Value Date   HGBA1C 6.1 01/24/2021       Component Value Date/Time   CHOL 137 04/26/2020 1133   TRIG 136.0 04/26/2020 1133   HDL 35.90 (L) 04/26/2020 1133   CHOLHDL 4 04/26/2020 1133   VLDL 27.2 04/26/2020 1133   Dean 74 04/26/2020 1133   LDLDIRECT 228.0 05/14/2018 0951   Clinical ASCVD: No  The  10-year ASCVD risk score (Arnett DK, et al., 2019) is: 10.4%   Values used to calculate the score:     Age: 64 years     Sex: Male     Is Non-Hispanic African American: No     Diabetic: Yes     Tobacco smoker: No     Systolic Blood Pressure: 123XX123 mmHg     Is BP treated: Yes     HDL Cholesterol: 35.9 mg/dL     Total Cholesterol: 137 mg/dL    BP Readings from Last 3 Encounters:  03/18/21 109/68  03/05/21 110/72  01/25/21 98/85    Care Plan  No Known Allergies  Medications Reviewed Today     Reviewed by De Hollingshead, RPH-CPP (Pharmacist) on 03/21/21 at [place] List Status: <None>   Medication Order Taking? Sig Documenting Provider Last Dose Status Informant  acetaminophen (TYLENOL) 500 MG tablet [id number] Yes Take 1,000 mg by mouth every 6 (six) hours as needed for moderate pain or headache. [provider] Taking Active   apixaban (ELIQUIS) 5 MG TABS tablet [id number] Yes Take 1 tablet (5 mg total) by mouth 2 (two) times daily. [name], MD Taking Active Self  atorvastatin (LIPITOR) 40 MG tablet [id number] Yes Take 1 tablet (40 mg total) by mouth daily. Einar Pheasant, MD Taking Active   buPROPion (WELLBUTRIN XL) 150 MG 24 hr tablet [id number] Yes Take 1 tablet (150 mg total) by mouth daily. [name], MD Taking Active   cetirizine (ZYRTEC) 10 MG tablet [id number] Yes Take 10 mg by mouth 2 (two) times daily.  [provider] Taking Active Self           Med Note [name], [name]   Tue Aug 10, 2018  8:35 PM)    citalopram (CELEXA) 40 MG tablet [id number] No Take 1 tablet (40 mg total) by mouth every morning.  Patient not taking: Reported on 03/21/2021   [name], MD Not Taking Active   CONTOUR NEXT TEST test strip [id number]  USE AS INSTRUCTED TO CHECK SUGARS TWICE A Lemont Fillers, MD  Active   digoxin (LANOXIN) 0.125 MG tablet [id number] Yes Take 0.5 tablets (0.0625 mg total) by mouth daily. [name], MD Taking Active   FARXIGA 5 MG  TABS tablet [id number] Yes TAKE 1 TABLET(5 MG) BY MOUTH DAILY BEFORE AND Laveda Norman, MD Taking Active   fluticasone (FLONASE) 50 MCG/ACT nasal spray [id number] Yes Place 2 sprays into both nostrils daily as needed for allergies or rhinitis. [provider] Taking Active   Lancets (ONETOUCH DELICA PLUS 123XX123) [place] [id number] Yes USE TWICE DAILY AS DIRECTED Einar Pheasant, MD Taking Active Self  metFORMIN (GLUCOPHAGE) 500 MG tablet [id number] Yes Take 1 tablet (500 mg total) by mouth 2 (two) times daily. Einar Pheasant, MD Taking Active   metoprolol succinate (TOPROL XL) 25 MG 24 hr tablet [id number] Yes Take 1 tablet (25 mg total) by mouth daily. [name], MD Taking Active   NON FORMULARY [id number]  as directed. CPAP [provider]  Active Self  OZEMPIC, 0.25 OR 0.5 MG/DOSE, 2 MG/1.5ML SOPN [id number] Yes INJECT 0.25 MG WEEKLY FOR 4 WEEKS THEN INCREASE TO 0.5 MG WEEKLY Einar Pheasant, MD Taking Active   QUEtiapine (SEROQUEL) 50 MG tablet [id number] Yes Take 1 tablet (50 mg total) by mouth at bedtime. [name], MD Taking Active   sacubitril-valsartan [facility]) 24-26 [place] [id number] Yes Take 1 tablet by mouth 2 (two) times daily. [name], MD Taking Active             Patient Active Problem List   Diagnosis Date Noted   Rash and other nonspecific skin eruption 03/18/2021   Pain in right shoulder 03/18/2021   Low back pain 03/18/2021   Bilateral knee pain 03/18/2021   Positive ANA (antinuclear antibody) 03/18/2021   Malar rash 01/25/2021   S/P AVR 10/04/2020   PTSD (post-traumatic stress disorder) 04/28/2020   Thyroid nodule 04/28/2020   Healthcare maintenance 12/26/2019   Chronic combined systolic and diastolic heart failure ([place])    Abdominal pain 12/05/2018   Type 2 diabetes mellitus with hyperglycemia ([place]) 12/05/2018   Portal hypertensive gastropathy ([place]) 09/16/2018   Abscess of left groin 07/23/2018   Occipital headache 04/30/2018    Neck pain 04/30/2018   Cervical radiculopathy 04/30/2018   Obesity, diabetes, and hypertension syndrome ([place]) 04/26/2018   History of colon polyps 07/05/2017   Chronic anticoagulation 01/09/2017   Low testosterone level in male 09/21/2016   Atrial fibrillation ([place]) 05/23/2016   Sleep apnea 05/11/2016   Nasal congestion 08/26/2015   Environmental allergies 07/22/2015   Other specified cardiac arrhythmias 07/01/2014   Aortic valve disorder 07/01/2014   Essential hypertension 06/08/2014   Hyperlipidemia 06/08/2014   Aortic valve stenosis     Conditions to be addressed/monitored: CHF, HTN, HLD, and DMII  Care Plan : Medication Management  Updates made by De Hollingshead, RPH-CPP since 03/21/2021 12:00 AM     Problem: Diabetes, Heart Failure, Aortic Disease      Long-Range Goal: Disease Progression Prevention   This  Visit's Progress: On track  Recent Progress: On track  Priority: High  Note:   Current Barriers:  Unable to achieve control of diabetes   Pharmacist Clinical Goal(s):  Over the next 90 days, patient will achieve control of diabetes as evidenced by improvement in A1c through collaboration with PharmD and provider.   Interventions: 1:1 collaboration with Einar Pheasant, MD regarding development and update of comprehensive plan of care as evidenced by provider attestation and co-signature Inter-disciplinary care team collaboration (see longitudinal plan of care) Comprehensive medication review performed; medication list updated in electronic medical record  Diabetes: Controlled per last A1c; current treatment: Farxiga 5 mg daily, metformin 500 mg BID, Ozempic 0.5 mg weekly  Current glucose readings: fasting glucose: 110-130s; bedtime: 170s Weight: 240; up to 255 lbs; eating more lately. Has been off citalopram for the past month, feels like he might be more worried about going back to work next week and is eating more.  Discussed moderate increase in weight,  glucose. Discussed impact of upcoming change with life, also lack of SSRI. Patient is interesting increasing Ozempic. Increase Ozempic to 1 mg weekly. Patient can take 2 doses of 0.5 mg weekly to complete current supply. Counseled on potential for increased GI side effects.   Heart Failure, Atrial Fibrillation: Appropriately managed; current treatment: Entresto 24/26 mg BID, metoprolol succinate 25 mg QAM, Farxiga 5 mg daily, digoxin 0.0625 mg daily Anticoagulant treatment: Eliquis 5 mg BID Hx spironolactone - stopped d/t lower BP, questionable relation to bilateral rash Previously recommended to continue current regimen in collaboration with cardiology. Consider [place] maximization in the future.   Hyperlipidemia and ASCVD risk reduction: Uncontrolled, LDL not at goal <70 given diabetes and risk factors; current treatment: atorvastatin 40 mg daily Antiplatelet therapy: aspirin 81 mg daily Recommended to continue current regimen at this time. Recommend recheck of lipids with next lab work and maximization to 80 mg daily if LDL not at goal <70  Depression/Anxiety: Moderately well managed. Current regimen: bupropion XL 150 mg daily, citalopram 40 mg daily, quetiapine 25 mg QPM, follows w/ LCSW and psychiatry.  Reports he has been off citalopram for about a month, there was confusion with the pharmacy and sending to his old provider for refill. He will pick up this supply from the pharmacy tomorrow.  Recommended to continue current regimen and collaboration with psychiatry at this time  Allergies: Controlled per patient report; cetirizine 10 mg BID, fluticasone intranasal PRN Recommended to continue current regimen at this time  Patient Goals/Self-Care Activities Over the next days, patient will:  - take medications as prescribed check glucose at least twice daily, document, and provide at future appointments  Follow Up Plan: Telephone follow up appointment with care management team member  scheduled for: ~8 weeks     Medication Assistance:  None required.  Patient affirms current coverage meets needs.  Follow Up:  Patient agrees to Care Plan and Follow-up.  Plan: Telephone follow up appointment with care management team member scheduled for:  ~ 6 weeks  Catie Darnelle Maffucci, PharmD, [place], [place] Clinical Pharmacist [employer] at [employer] [phone]

## 2021-03-25 ENCOUNTER — Telehealth: Payer: Self-pay | Admitting: Internal Medicine

## 2021-03-25 NOTE — Telephone Encounter (Signed)
I called patient's wife, Butch Penny, who verbalized understanding, f/u appt cancelled.

## 2021-03-25 NOTE — Telephone Encounter (Signed)
His results are all back and entirely normal. Markers for inflammation are negative. The more specific tests for lupus and autoimmune disease were also negative. I do not think he needs to come back for a routine f/u. If joint problems such as the shoulder get a lot worse, they can let us know if needing something like physical therapy referral or joint injection treatment in the future (PRN).

## 2021-03-25 NOTE — Telephone Encounter (Signed)
Patient's wife calling in reference to patient's lab results. Dr. Benjamine Mola told patient he could cancel follow visit if lab results were normal. Wife calling to see if they need to keep his follow up appointment, or cancel it? Please call to advise.

## 2021-03-26 ENCOUNTER — Telehealth: Payer: 59

## 2021-04-02 ENCOUNTER — Ambulatory Visit: Payer: 59 | Admitting: Internal Medicine

## 2021-04-08 ENCOUNTER — Other Ambulatory Visit: Payer: Self-pay

## 2021-04-08 ENCOUNTER — Ambulatory Visit (INDEPENDENT_AMBULATORY_CARE_PROVIDER_SITE_OTHER): Payer: 59 | Admitting: Licensed Clinical Social Worker

## 2021-04-08 DIAGNOSIS — F4310 Post-traumatic stress disorder, unspecified: Secondary | ICD-10-CM | POA: Diagnosis not present

## 2021-04-08 NOTE — Plan of Care (Signed)
  Problem: Reduce the negative impact trauma related symptoms have on social, occupational, and family functioning. Goal: LTG: Reduce frequency, intensity, and duration of PTSD symptoms so daily functioning is improved: Input needed on appropriate metric.  Patient will score less than 5 on the self report scale  (1-10) Outcome: Progressing Goal: STG: Practice interpersonal effectiveness skills 7 times per week for the next 16 weeks Outcome: Progressing Intervention: Dalton "[name]" TO IDENTIFY THEIR 3 PERSONAL GOALS FOR MANAGING DEPRESSION SYMPTOMS AND ADD TO THIS PLAN Intervention: REVIEW PLEASE SKILLS (TREAT PHYSICAL ILLNESS, BALANCE EATING, AVOID MOOD-ALTERING SUBSTANCES, BALANCE SLEEP AND GET EXERCISE) WITH Dalton "[name]" Intervention: Support useful positive self-talk Intervention: Assess history of psychiatric trauma

## 2021-04-08 NOTE — Progress Notes (Signed)
Virtual Visit via Audio Note  I connected with Dalton [name] on 04/08/21 at 11:00 AM EDT by an audio enabled telemedicine application and verified that I am speaking with the correct person using two identifiers.  Location: Patient: home Provider: remote office [place], [place])   I discussed the limitations of evaluation and management by telemedicine and the availability of in person appointments. The patient expressed understanding and agreed to proceed.  I discussed the assessment and treatment plan with the patient. The patient was provided an opportunity to ask questions and all were answered. The patient agreed with the plan and demonstrated an understanding of the instructions.   The patient was advised to call back or seek an in-person evaluation if the symptoms worsen or if the condition fails to improve as anticipated.  I provided 45 minutes of non-face-to-face time during this encounter.   Karra Pink R Raenah Murley, LCSW   THERAPIST PROGRESS NOTE  Session Time: 11-11:45a  Participation Level: Active  Behavioral Response: NAAlertEuthymic  Type of Therapy: Individual Therapy  Treatment Goals addressed:  Goal: LTG: Reduce frequency, intensity, and duration of PTSD symptoms so daily functioning is improved: Input needed on appropriate metric.  Patient will score less than 5 on the self report scale  (1-10) Outcome: Progressing  Goal: STG: Practice interpersonal effectiveness skills 7 times per week for the next 16 weeks Outcome: Progressing  Interventions:  Intervention: [place] "[name]" TO IDENTIFY THEIR 3 PERSONAL GOALS FOR MANAGING DEPRESSION SYMPTOMS AND ADD TO THIS PLAN  Intervention: REVIEW PLEASE SKILLS (TREAT PHYSICAL ILLNESS, BALANCE EATING, AVOID MOOD-ALTERING SUBSTANCES, BALANCE SLEEP AND GET EXERCISE) WITH [place] "[name]"  Intervention: Support useful positive self-talk  Intervention: Assess history of psychiatric trauma Summary: Dalton [name] is a 55 y.o. male who presents with improving symptoms related to PTSD diagnosis. Pt reports that he feels his overall mood is stable and that he is managing stress and anxiety well.   Allowed pt to explore and express thoughts and feelings associated with recent life situations and external stressors. Pt reports that he is back at work and is enjoying it. Pt reports that they have been very accommodating to his needs and schedule. Pt going into work later each day and that works out well.   Explored relationships with family, newly-kindled friendships with [place] friends, and pt living in his camper for the past 3 weeks. Pt states that family members have been sick and pt cannot afford to be out of work--so he has been living in the camper.   Explored trauma and how pt is using guided imagery from past (grandfather's experiences with PTSD) and recreating them in his own experience living in the camper. Pt reports that he is functioning well and healing from his surgery. Reviewed coping skills to manage stress and depression symptoms.  Continued recommendations are as follows: self care behaviors, positive social engagements, focusing on overall work/home/life balance, and focusing on positive physical and emotional wellness.   Suicidal/Homicidal: No  Therapist Response: Pt is continuing to apply interventions learned in session into daily life situations. Pt is currently on track to meet goals utilizing interventions mentioned above. Personal growth and progress noted. Treatment to continue as indicated.    Plan: Return again in 4 weeks.  Diagnosis: Axis I: Post Traumatic Stress Disorder    Axis II: No diagnosis    [place], LCSW 04/08/2021

## 2021-04-12 NOTE — Progress Notes (Deleted)
[facility] MD/PA/NP OP Progress Note  04/12/2021 1:43 PM [name]  MRN:  [id number]  Chief Complaint:  HPI: *** Visit Diagnosis: No diagnosis found.  Past Psychiatric History: Please see initial evaluation for full details. I have reviewed the history. No updates at this time.     Past Medical History:  Past Medical History:  Diagnosis Date   Aortic valve stenosis    a. Bicuspid AV - 2D Echo 06/2014 - mod AS, mild AI, mildly dilated aortic root.   Arthritis    Neck   CAD (coronary artery disease)    Carotid artery disease ([place])    a. Mild by duplex 05/2015 - 1-39% BICA. Repeat due 05/2017.   Depression    Diabetes mellitus without complication ([place])    Dilated aortic root ([place])    a. By echo 06/2014.   Dyspnea    with exercion   Dysrhythmia    Atrial Fibrilation   Environmental allergies    Headache    MIgraines- rare   Heart murmur    Hypercholesterolemia    Hypertension    Obesity (BMI 30-39.9) 04/26/2018   PTSD (post-traumatic stress disorder)    Sleep apnea    CPAP   SVT (supraventricular tachycardia) ([place])    a. h/o SVT - Ablation done 2013.    Past Surgical History:  Procedure Laterality Date   AORTIC VALVE REPLACEMENT N/A 10/04/2020   Procedure: AORTIC VALVE REPLACEMENT (AVR);  Surgeon: Gaye Pollack, MD;  Location: [place];  Service: Open Heart Surgery;  Laterality: N/A;   CARDIAC ELECTROPHYSIOLOGY STUDY AND ABLATION  08/21/2011   COLONOSCOPY WITH PROPOFOL N/A 06/26/2017   Procedure: COLONOSCOPY WITH PROPOFOL;  Surgeon: Manya Silvas, MD;  Location[facility] ENDOSCOPY;  Service: Endoscopy;  Laterality: N/A;   ESOPHAGOGASTRODUODENOSCOPY (EGD) WITH PROPOFOL N/A 06/26/2017   Procedure: ESOPHAGOGASTRODUODENOSCOPY (EGD) WITH PROPOFOL;  Surgeon: Manya Silvas, MD;  Location: [facility] ENDOSCOPY;  Service: Endoscopy;  Laterality: N/A;   MAZE N/A 10/04/2020   Procedure: MAZE;  Surgeon: Gaye Pollack, MD;  Location: [place];  Service: Open Heart Surgery;  Laterality:  N/A;   RIGHT/LEFT HEART CATH AND CORONARY ANGIOGRAPHY N/A 11/16/2019   Procedure: RIGHT/LEFT HEART CATH AND CORONARY ANGIOGRAPHY;  Surgeon: [name], MD;  Location: [place] CV LAB;  Service: Cardiovascular;  Laterality: N/A;   SEPTOPLASTY N/A 03/22/2015   Procedure: SEPTOPLASTY  WITH RIGHT INFERIOR TURBINATE REDUCTION;  Surgeon: Margaretha Sheffield, MD;  Location: [place];  Service: ENT;  Laterality: N/A;   SUPRAVENTRICULAR TACHYCARDIA ABLATION N/A 08/21/2011   Procedure: SUPRAVENTRICULAR TACHYCARDIA ABLATION;  Surgeon: Evans Lance, MD;  Location: [facility] CATH LAB;  Service: Cardiovascular;  Laterality: N/A;   surgery for non descending testicle     TEE WITHOUT CARDIOVERSION N/A 11/16/2019   Procedure: TRANSESOPHAGEAL ECHOCARDIOGRAM (TEE);  Surgeon: Geralynn Rile, MD;  Location: [place];  Service: Cardiovascular;  Laterality: N/A;   TEE WITHOUT CARDIOVERSION N/A 10/04/2020   Procedure: TRANSESOPHAGEAL ECHOCARDIOGRAM (TEE);  Surgeon: Gaye Pollack, MD;  Location: [place];  Service: Open Heart Surgery;  Laterality: N/A;   TONSILLECTOMY      Family Psychiatric History: Please see initial evaluation for full details. I have reviewed the history. No updates at this time.     Family History:  Family History  Problem Relation Age of Onset   Diabetes Mother    Heart disease Mother    Arthritis Mother    Heart attack Father    Kidney disease Father  Cancer Father    Breast cancer Maternal Grandmother    Cancer Maternal Grandfather    Aortic stenosis Maternal Grandfather     Social History:  Social History   Socioeconomic History   Marital status: Married    Spouse name: Not on file   Number of children: 2   Years of education: Not on file   Highest education level: Not on file  Occupational History   Occupation: truck driver    Employer: [employer]  Tobacco Use   Smoking status: Former    Types: Cigarettes    Quit date: 11/04/1988    Years since  quitting: 32.4   Smokeless tobacco: Former  Scientific laboratory technician Use: Never used  Substance and Sexual Activity   Alcohol use: Yes    Comment: occasionally   Drug use: Not Currently    Comment: many years ago in highschool   Sexual activity: Not Currently  Other Topics Concern   Not on file  Social History Narrative   Very rare exercise   Social Determinants of Health   Financial Resource Strain: Low Risk    Difficulty of Paying Living Expenses: Not hard at all  Food Insecurity: Not on file  Transportation Needs: Not on file  Physical Activity: Not on file  Stress: Not on file  Social Connections: Not on file    Allergies: No Known Allergies  Metabolic Disorder Labs: Lab Results  Component Value Date   HGBA1C 6.1 01/24/2021   MPG 128.37 09/20/2020   No results found for: PROLACTIN Lab Results  Component Value Date   CHOL 137 04/26/2020   TRIG 136.0 04/26/2020   HDL 35.90 (L) 04/26/2020   CHOLHDL 4 04/26/2020   VLDL 27.2 04/26/2020   LDLCALC 74 04/26/2020   LDLCALC 84 12/26/2019   Lab Results  Component Value Date   TSH 1.22 06/09/2019   TSH 1.56 05/14/2018    Therapeutic Level Labs: No results found for: LITHIUM No results found for: VALPROATE No components found for:  CBMZ  Current Medications: Current Outpatient Medications  Medication Sig Dispense Refill   acetaminophen (TYLENOL) 500 MG tablet Take 1,000 mg by mouth every 6 (six) hours as needed for moderate pain or headache.     apixaban (ELIQUIS) 5 MG TABS tablet Take 1 tablet (5 mg total) by mouth 2 (two) times daily. 180 tablet 3   atorvastatin (LIPITOR) 40 MG tablet Take 1 tablet (40 mg total) by mouth daily. 90 tablet 3   buPROPion (WELLBUTRIN XL) 150 MG 24 hr tablet Take 1 tablet (150 mg total) by mouth daily. 30 tablet 1   cetirizine (ZYRTEC) 10 MG tablet Take 10 mg by mouth 2 (two) times daily.      citalopram (CELEXA) 40 MG tablet Take 1 tablet (40 mg total) by mouth every morning. (Patient  not taking: Reported on 03/21/2021) 90 tablet 0   CONTOUR NEXT TEST test strip USE AS INSTRUCTED TO CHECK SUGARS TWICE A DAY 100 strip 1   digoxin (LANOXIN) 0.125 MG tablet Take 0.5 tablets (0.0625 mg total) by mouth daily. 45 tablet 3   FARXIGA 5 MG TABS tablet TAKE 1 TABLET(5 MG) BY MOUTH DAILY BEFORE AND BREAKFAST 30 tablet 2   fluticasone (FLONASE) 50 MCG/ACT nasal spray Place 2 sprays into both nostrils daily as needed for allergies or rhinitis.     Lancets (ONETOUCH DELICA PLUS [id number]) MISC USE TWICE DAILY AS DIRECTED 100 each 7   metFORMIN (GLUCOPHAGE) 500 MG tablet Take  1 tablet (500 mg total) by mouth 2 (two) times daily. 180 tablet 1   metoprolol succinate (TOPROL XL) 25 MG 24 hr tablet Take 1 tablet (25 mg total) by mouth daily. 90 tablet 3   NON FORMULARY as directed. CPAP     QUEtiapine (SEROQUEL) 50 MG tablet Take 1 tablet (50 mg total) by mouth at bedtime. 30 tablet 1   sacubitril-valsartan (ENTRESTO) 24-26 MG Take 1 tablet by mouth 2 (two) times daily. 60 tablet 11   Semaglutide, 1 MG/DOSE, (OZEMPIC, 1 MG/DOSE,) 4 MG/3ML SOPN Inject 1 mg into the skin once a week. 3 mL 2   No current facility-administered medications for this visit.     Musculoskeletal: Strength & Muscle Tone:  N/A Gait & Station:  N/A Patient leans: N/A  Psychiatric Specialty Exam: Review of Systems  There were no vitals taken for this visit.There is no height or weight on file to calculate BMI.  General Appearance: {Appearance:22683}  Eye Contact:  {BHH EYE CONTACT:22684}  Speech:  Clear and Coherent  Volume:  Normal  Mood:  {BHH MOOD:22306}  Affect:  {Affect (PAA):22687}  Thought Process:  Coherent  Orientation:  Full (Time, Place, and Person)  Thought Content: Logical   Suicidal Thoughts:  {ST/HT (PAA):22692}  Homicidal Thoughts:  {ST/HT (PAA):22692}  Memory:  Immediate;   Good  Judgement:  {Judgement (PAA):22694}  Insight:  {Insight (PAA):22695}  Psychomotor Activity:  Normal   Concentration:  Concentration: Good and Attention Span: Good  Recall:  Good  Fund of Knowledge: Good  Language: Good  Akathisia:  No  Handed:  Right  AIMS (if indicated): not done  Assets:  Communication Skills Desire for Improvement  ADL's:  Intact  Cognition: WNL  Sleep:  {BHH GOOD/FAIR/POOR:22877}   Screenings: GAD-7    Health and safety inspector from 07/27/2020 in [place]  Total GAD-7 Score 13      PHQ2-9    Flowsheet Row Counselor from 04/08/2021 in [place] Counselor from 01/28/2021 in [place] Counselor from 12/18/2020 in [place] Counselor from 07/27/2020 in [place] Office Visit from 03/29/2019 in [place]  PHQ-2 Total Score 3 4 4 3  0  PHQ-9 Total Score 6 20 17 12  0      Flowsheet Row Counselor from 03/05/2021 in [place] Counselor from 01/28/2021 in [place] Counselor from 12/18/2020 in [place] Low Risk Low Risk No Risk        Assessment and Plan:  Dalton [name] is a 55 y.o. 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     1. PTSD (post-traumatic stress disorder) 2. Moderate episode of recurrent major depressive disorder ([place]) He reports PTSD symptoms with prominent irritability, and depressive symptoms in the context of recovering from recent cardiac surgery. Other psychosocial stressors includes financial strain, history of being in [employer], and being denied of  service connection by [place]. will uptitrate quetiapine to optimize treatment for PTSD and depression.  Discussed potential risk of metabolic side effect and EPS.  Will continue citalopram to target PTSD and depression.  Will continue bupropion to target depression.    # Parasomnia  Unchanged. He reports a history of parasomnia.  Per chart review, he did have a home sleep study in last year, and was recommended for further evaluation.  He was advised again to contact his provider.    Plan   1. Continue Citalopram 40 mg daily 2. Continue bupropion 150 mg daily 3. Increase quetiapine 50 mg at night - monitor drowsiness (QTc 405 msec 10/2020) 5. Please contact your provider for your sleep issues/further evaluation  6. Next appointment: 10/11 at 9 AM, video  [phone] - send link to this number.  7. He would like the record to be sent to [place]. The front desk to contact him to obtain consent to release information.     Past trials of medication: citalopram, lamotrigine, quetiapine, Trazodone,    The patient demonstrates the following risk factors for suicide: Chronic risk factors for suicide include: psychiatric disorder of depression, PTSD. Acute risk factors for suicide include: N/A. Protective factors for this patient include: positive social support, responsibility to others (children, family), coping skills and hope for the future. Considering these factors, the overall suicide risk at this point appears to be low. Patient is appropriate for outpatient follow up.          [name], MD 04/12/2021, 1:43 PM

## 2021-04-16 ENCOUNTER — Telehealth: Payer: 59 | Admitting: Psychiatry

## 2021-04-24 ENCOUNTER — Ambulatory Visit: Payer: 59 | Admitting: Internal Medicine

## 2021-04-29 ENCOUNTER — Ambulatory Visit: Payer: 59 | Admitting: Pharmacist

## 2021-04-29 DIAGNOSIS — E1165 Type 2 diabetes mellitus with hyperglycemia: Secondary | ICD-10-CM

## 2021-04-29 NOTE — Patient Instructions (Signed)
Visit Information   Goals Addressed               This Visit's Progress     Patient Stated     Medication Monitoring (pt-stated)        Patient Goals/Self-Care Activities Over the next days, patient will:  - take medications as prescribed check glucose at least twice daily, document, and provide at future appointments        Patient verbalizes understanding of instructions provided today and agrees to view in [place].   Plan: Telephone follow up appointment with care management team member scheduled for:  3 months  Catie Darnelle Maffucci, PharmD, [place], [place] Clinical Pharmacist [employer] at [employer] [phone]

## 2021-04-29 NOTE — Chronic Care Management (AMB) (Signed)
Care Management   Pharmacy Note  04/29/2021 Name: MARVIN GRABILL MRN: [id number] DOB: [birth year]  Subjective: Dalton [name] is a 55 y.o. year old male who is a primary care patient of Einar Pheasant, MD. The Care Management team was consulted for assistance with care management and care coordination needs.    Engaged with patient by telephone for follow up visit in response to provider referral for pharmacy case management and/or care coordination services.   The patient was given information about Care Management services today including:  Care Management services includes personalized support from designated clinical staff supervised by the patient's primary care provider, including individualized plan of care and coordination with other care providers. 24/7 contact phone numbers for assistance for urgent and routine care needs. The patient may stop case management services at any time by phone call to the office staff.  Patient agreed to services and consent obtained.  Assessment:  Review of patient status, including review of consultants reports, laboratory and other test data, was performed as part of comprehensive evaluation and provision of chronic care management services.   SDOH (Social Determinants of Health) assessments and interventions performed:  SDOH Interventions    Flowsheet Row Most Recent Value  SDOH Interventions   Financial Strain Interventions Intervention Not Indicated        Objective:  Lab Results  Component Value Date   CREATININE 1.12 01/24/2021   CREATININE 1.01 11/13/2020   CREATININE 1.00 10/07/2020    Lab Results  Component Value Date   HGBA1C 6.1 01/24/2021       Component Value Date/Time   CHOL 137 04/26/2020 1133   TRIG 136.0 04/26/2020 1133   HDL 35.90 (L) 04/26/2020 1133   CHOLHDL 4 04/26/2020 1133   VLDL 27.2 04/26/2020 1133   [place] 74 04/26/2020 1133   LDLDIRECT 228.0 05/14/2018 0951    BP Readings from Last 3  Encounters:  03/18/21 109/68  03/05/21 110/72  01/25/21 98/85    Care Plan  No Known Allergies  Medications Reviewed Today     Reviewed by De Hollingshead, RPH-CPP (Pharmacist) on 03/21/21 at 1536  Med List Status: <None>   Medication Order Taking? Sig Documenting Provider Last Dose Status Informant  acetaminophen (TYLENOL) 500 MG tablet [id number] Yes Take 1,000 mg by mouth every 6 (six) hours as needed for moderate pain or headache. [provider] Taking Active   apixaban (ELIQUIS) 5 MG TABS tablet [id number] Yes Take 1 tablet (5 mg total) by mouth 2 (two) times daily. [name], MD Taking Active Self  atorvastatin (LIPITOR) 40 MG tablet [id number] Yes Take 1 tablet (40 mg total) by mouth daily. Einar Pheasant, MD Taking Active   buPROPion (WELLBUTRIN XL) 150 MG 24 hr tablet [id number] Yes Take 1 tablet (150 mg total) by mouth daily. [name], MD Taking Active   cetirizine (ZYRTEC) 10 MG tablet [id number] Yes Take 10 mg by mouth 2 (two) times daily.  [provider] Taking Active Self           Med Note [name], [name]   Tue Aug 10, 2018  8:35 PM)    citalopram (CELEXA) 40 MG tablet [id number] No Take 1 tablet (40 mg total) by mouth every morning.  Patient not taking: Reported on 03/21/2021   [name], MD Not Taking Active   CONTOUR NEXT TEST test strip [id number]  USE AS INSTRUCTED TO CHECK SUGARS TWICE A Lemont Fillers, MD  Active   digoxin (LANOXIN) 0.125 MG  tablet [id number] Yes Take 0.5 tablets (0.0625 mg total) by mouth daily. [name], MD Taking Active   FARXIGA 5 MG TABS tablet [id number] Yes TAKE 1 TABLET(5 MG) BY MOUTH DAILY BEFORE AND [place] Norman, MD Taking Active   fluticasone (FLONASE) 50 MCG/ACT nasal spray [id number] Yes Place 2 sprays into both nostrils daily as needed for allergies or rhinitis. [provider] Taking Active   Lancets (ONETOUCH DELICA PLUS [id number]) [place] [id number] Yes USE TWICE DAILY AS  DIRECTED Einar Pheasant, MD Taking Active Self  metFORMIN (GLUCOPHAGE) 500 MG tablet [id number] Yes Take 1 tablet (500 mg total) by mouth 2 (two) times daily. Einar Pheasant, MD Taking Active   metoprolol succinate (TOPROL XL) 25 MG 24 hr tablet [id number] Yes Take 1 tablet (25 mg total) by mouth daily. [name], MD Taking Active   NON FORMULARY [id number]  as directed. CPAP [provider]  Active Self  OZEMPIC, 0.25 OR 0.5 MG/DOSE, 2 MG/1.5ML SOPN [id number] Yes INJECT 0.25 MG WEEKLY FOR 4 WEEKS THEN INCREASE TO 0.5 MG WEEKLY Einar Pheasant, MD Taking Active   QUEtiapine (SEROQUEL) 50 MG tablet [id number] Yes Take 1 tablet (50 mg total) by mouth at bedtime. [name], MD Taking Active   sacubitril-valsartan [facility]) 24-26 [place] [id number] Yes Take 1 tablet by mouth 2 (two) times daily. [name], MD Taking Active             Patient Active Problem List   Diagnosis Date Noted   Rash and other nonspecific skin eruption 03/18/2021   Pain in right shoulder 03/18/2021   Low back pain 03/18/2021   Bilateral knee pain 03/18/2021   Positive ANA (antinuclear antibody) 03/18/2021   [place] rash 01/25/2021   S/P AVR 10/04/2020   PTSD (post-traumatic stress disorder) 04/28/2020   Thyroid nodule 04/28/2020   Healthcare maintenance 12/26/2019   Chronic combined systolic and diastolic heart failure ([place])    Abdominal pain 12/05/2018   Type 2 diabetes mellitus with hyperglycemia ([place]) 12/05/2018   Portal hypertensive gastropathy ([place]) 09/16/2018   Abscess of left groin 07/23/2018   Occipital headache 04/30/2018   Neck pain 04/30/2018   Cervical radiculopathy 04/30/2018   Obesity, diabetes, and hypertension syndrome ([place]) 04/26/2018   History of colon polyps 07/05/2017   Chronic anticoagulation 01/09/2017   Low testosterone level in male 09/21/2016   Atrial fibrillation ([place]) 05/23/2016   Sleep apnea 05/11/2016   Nasal congestion 08/26/2015   Environmental allergies  07/22/2015   Other specified cardiac arrhythmias 07/01/2014   Aortic valve disorder 07/01/2014   Essential hypertension 06/08/2014   Hyperlipidemia 06/08/2014   Aortic valve stenosis     Conditions to be addressed/monitored: CHF and DMII  Care Plan : Medication Management  Updates made by De Hollingshead, RPH-CPP since 04/29/2021 12:00 AM     Problem: Diabetes, Heart Failure, Aortic Disease      Long-Range Goal: Disease Progression Prevention   Recent Progress: On track  Priority: High  Note:   Current Barriers:  Unable to achieve control of diabetes   Pharmacist Clinical Goal(s):  Over the next 90 days, patient will achieve control of diabetes as evidenced by improvement in A1c through collaboration with PharmD and provider.   Interventions: 1:1 collaboration with Einar Pheasant, MD regarding development and update of comprehensive plan of care as evidenced by provider attestation and co-signature Inter-disciplinary care team collaboration (see longitudinal plan of care) Comprehensive medication review performed; medication list updated in electronic medical record  Diabetes: Controlled  per last A1c; current treatment: Farxiga 5 mg daily, metformin 500 mg BID, Ozempic 1 mg weekly  Denies GI upset Current glucose readings: fasting glucose: 80-90s; post prandials <100 - patient asks if these are too low.  Weight: 241 lbs (down from 255 lbs) Praised for improvement in glucose readings, weight. Discussed that current glucose readings are within normal limits. Encouraged to continue current regimen at this time.   Heart Failure, Atrial Fibrillation: Appropriately managed; current treatment: Entresto 24/26 mg BID, metoprolol succinate 25 mg QAM, Farxiga 5 mg daily, digoxin 0.0625 mg daily Anticoagulant treatment: Eliquis 5 mg BID Hx spironolactone - stopped d/t lower BP, questionable relation to bilateral rash Previously recommended to continue current regimen in  collaboration with cardiology. Consider [place] maximization in the future.   Hyperlipidemia and ASCVD risk reduction: Uncontrolled, LDL not at goal <70 given diabetes and risk factors; current treatment: atorvastatin 40 mg daily Antiplatelet therapy: aspirin 81 mg daily Previously recommended to continue current regimen at this time. Recommend recheck of lipids with next lab work and maximization to 80 mg daily if LDL not at goal <70  Depression/Anxiety: Moderately well managed. Current regimen: bupropion XL 150 mg daily, citalopram 40 mg daily, quetiapine 25 mg QPM, follows w/ LCSW and psychiatry.  Previously recommended to continue current regimen and collaboration with psychiatry at this time  Allergies: Controlled per patient report; cetirizine 10 mg BID, fluticasone intranasal PRN Previously recommended to continue current regimen at this time  Patient Goals/Self-Care Activities Over the next days, patient will:  - take medications as prescribed check glucose at least twice daily, document, and provide at future appointments  Follow Up Plan: Telephone follow up appointment with care management team member scheduled for: ~3 months     Medication Assistance:  None required.  Patient affirms current coverage meets needs.  Follow Up:  Patient agrees to Care Plan and Follow-up.  Plan: Telephone follow up appointment with care management team member scheduled for:  3 months  Catie Darnelle Maffucci, PharmD, [place], [place] Clinical Pharmacist [employer] at [employer] [phone]

## 2021-05-08 ENCOUNTER — Other Ambulatory Visit: Payer: Self-pay | Admitting: Psychiatry

## 2021-05-08 ENCOUNTER — Telehealth: Payer: Self-pay

## 2021-05-08 MED ORDER — BUPROPION HCL ER (XL) 150 MG PO TB24: 150.0000 mg | ORAL_TABLET | Freq: Every day | ORAL | 1 refills | Status: DC

## 2021-05-08 NOTE — Telephone Encounter (Signed)
received fax request for refill on the bupropion xl 150mg 

## 2021-05-08 NOTE — Telephone Encounter (Signed)
Ordered

## 2021-05-10 ENCOUNTER — Other Ambulatory Visit: Payer: Self-pay | Admitting: Internal Medicine

## 2021-05-12 NOTE — Progress Notes (Signed)
Cardiology Office Note:    Date:  05/13/2021   ID:  Dalton [name], DOB 1966/06/17, MRN [id number]  PCP:  Einar Pheasant, MD  Cardiologist:  Sinclair Grooms, MD   Referring MD: Einar Pheasant, MD   Chief Complaint  Patient presents with   Congestive Heart Failure   Cardiac Valve Problem    History of Present Illness:    Dalton [name] is a 55 y.o. male with a hx of biscupid aortic valve with AS/AR, prior SVT s/p RF ablation, HTN, HLD, AVR 09/2020, primary hypertension, hyperlipidemia, DM II, OSA on CPAP, and chronic combined systolic and diastolic HF EF 67% 02/9380.  Running relatively low blood pressures.  Compliant with medication regimen which includes sacubitril valsartan 24/26 mg daily, metoprolol XL 25 mg daily, Lanoxin 0.0625 mg daily.  Purposeful 15 pound weight loss since last visit.  36 pound reduction in weight since March presurgery.  Feels better.  Says he is doing better.  He is back at work.  No fainting or funny spells have occurred.  He denies palpitations.  No lower extremity swelling.  He has back to his full-time job without difficulty.  He is accompanied by his wife.  Neither has any complaints.  Past Medical History:  Diagnosis Date   Aortic valve stenosis    a. Bicuspid AV - 2D Echo 06/2014 - mod AS, mild AI, mildly dilated aortic root.   Arthritis    Neck   CAD (coronary artery disease)    Carotid artery disease ([place])    a. Mild by duplex 05/2015 - 1-39% BICA. Repeat due 05/2017.   Depression    Diabetes mellitus without complication ([place])    Dilated aortic root ([place])    a. By echo 06/2014.   Dyspnea    with exercion   Dysrhythmia    Atrial Fibrilation   Environmental allergies    Headache    MIgraines- rare   Heart murmur    Hypercholesterolemia    Hypertension    Obesity (BMI 30-39.9) 04/26/2018   PTSD (post-traumatic stress disorder)    Sleep apnea    CPAP   SVT (supraventricular tachycardia) ([place])    a. h/o SVT - Ablation done  2013.    Past Surgical History:  Procedure Laterality Date   AORTIC VALVE REPLACEMENT N/A 10/04/2020   Procedure: AORTIC VALVE REPLACEMENT (AVR);  Surgeon: Gaye Pollack, MD;  Location: [facility];  Service: Open Heart Surgery;  Laterality: N/A;   CARDIAC ELECTROPHYSIOLOGY STUDY AND ABLATION  08/21/2011   COLONOSCOPY WITH PROPOFOL N/A 06/26/2017   Procedure: COLONOSCOPY WITH PROPOFOL;  Surgeon: Manya Silvas, MD;  Location[facility] ENDOSCOPY;  Service: Endoscopy;  Laterality: N/A;   ESOPHAGOGASTRODUODENOSCOPY (EGD) WITH PROPOFOL N/A 06/26/2017   Procedure: ESOPHAGOGASTRODUODENOSCOPY (EGD) WITH PROPOFOL;  Surgeon: Manya Silvas, MD;  Location: [facility] ENDOSCOPY;  Service: Endoscopy;  Laterality: N/A;   MAZE N/A 10/04/2020   Procedure: MAZE;  Surgeon: Gaye Pollack, MD;  Location: [facility];  Service: Open Heart Surgery;  Laterality: N/A;   RIGHT/LEFT HEART CATH AND CORONARY ANGIOGRAPHY N/A 11/16/2019   Procedure: RIGHT/LEFT HEART CATH AND CORONARY ANGIOGRAPHY;  Surgeon: [name], MD;  Location: [facility] [place] [facility] CV LAB;  Service: Cardiovascular;  Laterality: N/A;   SEPTOPLASTY N/A 03/22/2015   Procedure: SEPTOPLASTY  WITH RIGHT INFERIOR TURBINATE REDUCTION;  Surgeon: Margaretha Sheffield, MD;  Location: [facility];  Service: ENT;  Laterality: N/A;   SUPRAVENTRICULAR TACHYCARDIA ABLATION N/A 08/21/2011   Procedure: SUPRAVENTRICULAR TACHYCARDIA  ABLATION;  Surgeon: Evans Lance, MD;  Location: [facility] CATH LAB;  Service: Cardiovascular;  Laterality: N/A;   surgery for non descending testicle     TEE WITHOUT CARDIOVERSION N/A 11/16/2019   Procedure: TRANSESOPHAGEAL ECHOCARDIOGRAM (TEE);  Surgeon: Geralynn Rile, MD;  Location: [facility];  Service: Cardiovascular;  Laterality: N/A;   TEE WITHOUT CARDIOVERSION N/A 10/04/2020   Procedure: TRANSESOPHAGEAL ECHOCARDIOGRAM (TEE);  Surgeon: Gaye Pollack, MD;  Location: [facility];  Service: Open Heart Surgery;  Laterality: N/A;   TONSILLECTOMY      Current  Medications   Allergies:   Patient has no known allergies.   Social History   Socioeconomic History   Marital status: Married    Spouse name: Not on file   Number of children: 2   Years of education: Not on file   Highest education level: Not on file  Occupational History   Occupation: truck driver    Employer: [employer]  Tobacco Use   Smoking status: Former    Types: Cigarettes    Quit date: 11/04/1988    Years since quitting: 32.5   Smokeless tobacco: Former  Scientific laboratory technician Use: Never used  Substance and Sexual Activity   Alcohol use: Yes    Comment: occasionally   Drug use: Not Currently    Comment: many years ago in highschool   Sexual activity: Not Currently  Other Topics Concern   Not on file  Social History Narrative   Very rare exercise   Social Determinants of Health   Financial Resource Strain: Low Risk    Difficulty of Paying Living Expenses: Not very hard  Food Insecurity: Not on file  Transportation Needs: Not on file  Physical Activity: Not on file  Stress: Not on file  Social Connections: Not on file     Family History: The patient's family history includes Aortic stenosis in his maternal grandfather; Arthritis in his mother; Breast cancer in his maternal grandmother; Cancer in his father and maternal grandfather; Diabetes in his mother; Heart attack in his father; Heart disease in his mother; Kidney disease in his father.  ROS:   Please see the history of present illness.    Denies syncope and angina.  All other systems reviewed and are negative.  EKGs/Labs/Other Studies Reviewed:    The following studies were reviewed today:  ECHOCARDIOGRAM 11/2020: IMPRESSIONS     1. Left ventricular ejection fraction, by estimation, is 40 to 45%. The  left ventricle has mildly decreased function. The left ventricle  demonstrates global hypokinesis. There is mild left ventricular  hypertrophy. Left ventricular diastolic parameters  are consistent  with Grade I diastolic dysfunction (impaired relaxation).   2. Right ventricular systolic function is normal. The right ventricular  size is normal.   3. The mitral valve is normal in structure. No evidence of mitral valve  regurgitation. No evidence of mitral stenosis.   4. The aortic valve has been repaired/replaced. Aortic valve  regurgitation is not visualized. No aortic stenosis is present. There is a  25 mm Edwards valve present in the aortic position. Procedure Date:  10/04/20. Echo findings are consistent with normal   structure and function of the aortic valve prosthesis. Aortic valve area,  by VTI measures 1.76 cm. Aortic valve mean gradient measures 11.8 mmHg.  Aortic valve Vmax measures 2.43 m/s.   5. The inferior vena cava is normal in size with greater than 50%  respiratory variability, suggesting right atrial pressure of 3 mmHg.  EKG:  EKG not performed  Recent Labs: 10/05/2020: Magnesium 2.5 01/24/2021: ALT 21; BUN 13; Creatinine, Ser 1.12; Hemoglobin 15.4; Platelets 163.0; Potassium 4.6; Sodium 137  Recent Lipid Panel    Component Value Date/Time   CHOL 137 04/26/2020 1133   TRIG 136.0 04/26/2020 1133   HDL 35.90 (L) 04/26/2020 1133   CHOLHDL 4 04/26/2020 1133   VLDL 27.2 04/26/2020 1133   LDLCALC 74 04/26/2020 1133   LDLDIRECT 228.0 05/14/2018 0951    Physical Exam:    VS:  BP 98/62   Pulse (!) 101   Ht 6' (1.829 m)   Wt 243 lb 6.4 oz (110.4 kg)   SpO2 98%   BMI 33.01 kg/m     Wt Readings from Last 3 Encounters:  05/13/21 243 lb 6.4 oz (110.4 kg)  03/18/21 258 lb (117 kg)  03/05/21 256 lb (116.1 kg)     GEN: Overweight. No acute distress HEENT: Normal NECK: No JVD. LYMPHATICS: No lymphadenopathy CARDIAC: Faint 1/6 systolic murmur. RRR no gallop, or edema. VASCULAR:  Normal Pulses. No bruits. RESPIRATORY:  Clear to auscultation without rales, wheezing or rhonchi  ABDOMEN: Soft, non-tender, non-distended, No pulsatile mass, MUSCULOSKELETAL: No  deformity  SKIN: Warm and dry NEUROLOGIC:  Alert and oriented x 3 PSYCHIATRIC:  Normal affect   ASSESSMENT:    1. S/P AVR (aortic valve replacement)   2. Chronic combined systolic and diastolic HF (heart failure), NYHA class 2 ([place])   3. Essential hypertension   4. Persistent atrial fibrillation ([place])   5. Chronic anticoagulation   6. Obstructive sleep apnea syndrome   7. Hyperlipidemia, unspecified hyperlipidemia type   8. Coronary artery disease of native artery of native heart with stable angina pectoris ([place])    PLAN:    In order of problems listed above:  Clinically stable. Clinically stable on protective therapy that includes Toprol-XL 25 mg/day and Entresto 24/26 mg p.o. twice daily. Relatively low blood pressure.  He is gradually losing weight over time. Poor rate control.  Continue metoprolol succinate 25 mg/day and increase Lanoxin 0.125 mg daily.  May need an even higher dose.  Dig level will be done in 7 to 10 days. Continue Eliquis 5 mg twice daily. Using CPAP. Continue Lipitor 40 mg/day. Stable without angina.   Guideline directed therapy for left ventricular systolic dysfunction: Angiotensin receptor-neprilysin inhibitor (ARNI)-Entresto; beta-blocker therapy - carvedilol, metoprolol succinate, or bisoprolol; mineralocorticoid receptor antagonist (MRA) therapy -spironolactone or eplerenone.  SGLT-2 agents -  Dapagliflozin [place]) or Empagliflozin ([place]).These therapies have been shown to improve clinical outcomes including reduction of rehospitalization, survival, and acute heart failure.    Medication Adjustments/Labs and Tests Ordered: Current medicines are reviewed at length with the patient today.  Concerns regarding medicines are outlined above.  Orders Placed This Encounter  Procedures   Digoxin level    Meds ordered this encounter  Medications   digoxin (LANOXIN) 0.125 MG tablet    Sig: Take 1 tablet (0.125 mg total) by mouth daily.     Dispense:  90 tablet    Refill:  3    Dose change     There are no Patient Instructions on file for this visit.   Signed, Sinclair Grooms, MD  05/13/2021 1:50 PM    [facility]

## 2021-05-13 ENCOUNTER — Encounter: Payer: Self-pay | Admitting: Interventional Cardiology

## 2021-05-13 ENCOUNTER — Other Ambulatory Visit: Payer: Self-pay

## 2021-05-13 ENCOUNTER — Ambulatory Visit (INDEPENDENT_AMBULATORY_CARE_PROVIDER_SITE_OTHER): Payer: 59 | Admitting: Interventional Cardiology

## 2021-05-13 VITALS — BP 98/62 | HR 101 | Ht 72.0 in | Wt 243.4 lb

## 2021-05-13 DIAGNOSIS — I4819 Other persistent atrial fibrillation: Secondary | ICD-10-CM | POA: Diagnosis not present

## 2021-05-13 DIAGNOSIS — E785 Hyperlipidemia, unspecified: Secondary | ICD-10-CM

## 2021-05-13 DIAGNOSIS — I25118 Atherosclerotic heart disease of native coronary artery with other forms of angina pectoris: Secondary | ICD-10-CM

## 2021-05-13 DIAGNOSIS — Z952 Presence of prosthetic heart valve: Secondary | ICD-10-CM

## 2021-05-13 DIAGNOSIS — Z7901 Long term (current) use of anticoagulants: Secondary | ICD-10-CM

## 2021-05-13 DIAGNOSIS — I10 Essential (primary) hypertension: Secondary | ICD-10-CM

## 2021-05-13 DIAGNOSIS — I5042 Chronic combined systolic (congestive) and diastolic (congestive) heart failure: Secondary | ICD-10-CM | POA: Diagnosis not present

## 2021-05-13 DIAGNOSIS — G4733 Obstructive sleep apnea (adult) (pediatric): Secondary | ICD-10-CM

## 2021-05-13 MED ORDER — DIGOXIN 125 MCG PO TABS: 0.1250 mg | ORAL_TABLET | Freq: Every day | ORAL | 3 refills | Status: DC

## 2021-05-13 NOTE — Patient Instructions (Signed)
Medication Instructions:  1) INCREASE Digoxin 0.125mg  once daily  *If you need a refill on your cardiac medications before your next appointment, please call your pharmacy*   Lab Work: Digoxin level 10-14 days  If you have labs (blood work) drawn today and your tests are completely normal, you will receive your results only by: [place] (if you have MyChart) OR A paper copy in the mail If you have any lab test that is abnormal or we need to change your treatment, we will call you to review the results.   Testing/Procedures: None   Follow-Up: At [facility], you and your health needs are our priority.  As part of our continuing mission to provide you with exceptional heart care, we have created designated Provider Care Teams.  These Care Teams include your primary Cardiologist (physician) and Advanced Practice Providers (APPs -  Physician Assistants and Nurse Practitioners) who all work together to provide you with the care you need, when you need it.  We recommend signing up for the patient portal called "MyChart".  Sign up information is provided on this After Visit Summary.  MyChart is used to connect with patients for Virtual Visits (Telemedicine).  Patients are able to view lab/test results, encounter notes, upcoming appointments, etc.  Non-urgent messages can be sent to your provider as well.   To learn more about what you can do with MyChart, go to [email or web address].    Your next appointment:   6 month(s)  The format for your next appointment:   In Person  Provider:   Sinclair Grooms, MD     Other Instructions

## 2021-05-14 ENCOUNTER — Ambulatory Visit (INDEPENDENT_AMBULATORY_CARE_PROVIDER_SITE_OTHER): Payer: 59 | Admitting: Licensed Clinical Social Worker

## 2021-05-14 DIAGNOSIS — F4310 Post-traumatic stress disorder, unspecified: Secondary | ICD-10-CM | POA: Diagnosis not present

## 2021-05-14 NOTE — Plan of Care (Signed)
  Problem: Reduce the negative impact trauma related symptoms have on social, occupational, and family functioning. Goal: LTG: Reduce frequency, intensity, and duration of PTSD symptoms so daily functioning is improved: Input needed on appropriate metric.  Patient will score less than 5 on the self report scale  (1-10) Outcome: Progressing Goal: STG: Practice interpersonal effectiveness skills 7 times per week for the next 16 weeks Outcome: Progressing Intervention: [place] "[name]" TO IDENTIFY THE MAJOR COMPONENTS OF A RECENT EPISODE OF DEPRESSION: PHYSICAL SYMPTOMS, MAJOR THOUGHTS AND IMAGES, AND MAJOR BEHAVIORS THEY EXPERIENCED Intervention: Work with patient to identify the major components of a recent episode of anxiety: physical symptoms, major thoughts and images, and major behaviors they experienced Intervention: Assist with relaxation techniques, as appropriate (deep breathing exercises, meditation, guided imagery) Intervention: Educate patient on: Stress management Intervention: Encourage patient to identify triggers

## 2021-05-14 NOTE — Progress Notes (Signed)
Virtual Visit via Video Note  I connected with Dalton [name] on 05/14/21 at  1:00 PM EST by a video enabled telemedicine application and verified that I am speaking with the correct person using two identifiers.  Location: Patient: home/personal vehicle Provider: remote office [place], [place])   I discussed the limitations of evaluation and management by telemedicine and the availability of in person appointments. The patient expressed understanding and agreed to proceed.  I discussed the assessment and treatment plan with the patient. The patient was provided an opportunity to ask questions and all were answered. The patient agreed with the plan and demonstrated an understanding of the instructions.   The patient was advised to call back or seek an in-person evaluation if the symptoms worsen or if the condition fails to improve as anticipated.  I provided 30 minutes of non-face-to-face time during this encounter.   Dalton Whisonant R Meredith Kilbride, LCSW   THERAPIST PROGRESS NOTE  Session Time: 1-130p  Participation Level: Active  Behavioral Response: NeatAlertAnxious  Type of Therapy: Individual Therapy  Treatment Goals addressed:  Problem: Reduce the negative impact trauma related symptoms have on social, occupational, and family functioning. Goal: LTG: Reduce frequency, intensity, and duration of PTSD symptoms so daily functioning is improved: Input needed on appropriate metric.  Patient will score less than 5 on the self report scale  (1-10) Outcome: Progressing Goal: STG: Practice interpersonal effectiveness skills 7 times per week for the next 16 weeks Outcome: Progressing Interventions:  Intervention: [place] "[name]" TO IDENTIFY THE MAJOR COMPONENTS OF A RECENT EPISODE OF DEPRESSION: PHYSICAL SYMPTOMS, MAJOR THOUGHTS AND IMAGES, AND MAJOR BEHAVIORS THEY EXPERIENCED Intervention: Work with patient to identify the major components of a recent episode of anxiety: physical  symptoms, major thoughts and images, and major behaviors they experienced Intervention: Assist with relaxation techniques, as appropriate (deep breathing exercises, meditation, guided imagery) Intervention: Educate patient on: Stress management Intervention: Encourage patient to identify triggers   Summary: Dalton [name] is a 55 y.o. male who presents with symptoms consistent with PTSD . Pt reports that overall mood is stable and that he is managing situational stressors well. Pt reports that he is compliant with medication.   Allowed pt to explore and express thoughts and feelings associated with recent life situations and external stressors. Discussed work-related issues--work is going well and pt has gone from light duty to full duty.  Discussed panic attacks that pt is still having. Identified triggers and discussed ways of managing panic in the moment. Pt reports that he has lost 15 lbs recently--pt feels that he is making healthier choices and is eating smaller portions. Pt feels good about overall stability and progress.  Continued recommendations are as follows: self care behaviors, positive social engagements, focusing on overall work/home/life balance, and focusing on positive physical and emotional wellness.    Suicidal/Homicidal: No  Therapist Response: Pt is continuing to apply interventions learned in session into daily life situations. Pt is currently on track to meet goals utilizing interventions mentioned above. Personal growth and progress noted. Treatment to continue as indicated.   Plan: Return again in 4 weeks.  Diagnosis: Axis I: PTSD, anxiety    Axis II: No diagnosis    Dalton Bo Lasheka Kempner, LCSW 05/14/2021

## 2021-05-27 ENCOUNTER — Other Ambulatory Visit: Payer: 59 | Admitting: *Deleted

## 2021-05-27 ENCOUNTER — Other Ambulatory Visit: Payer: Self-pay

## 2021-05-27 DIAGNOSIS — I4819 Other persistent atrial fibrillation: Secondary | ICD-10-CM

## 2021-05-27 DIAGNOSIS — I5042 Chronic combined systolic (congestive) and diastolic (congestive) heart failure: Secondary | ICD-10-CM

## 2021-05-27 DIAGNOSIS — Z952 Presence of prosthetic heart valve: Secondary | ICD-10-CM

## 2021-05-28 LAB — DIGOXIN LEVEL: Digoxin, Serum: <0.4 ng/mL — ABNORMAL LOW (ref 0.5–0.9)

## 2021-05-30 ENCOUNTER — Encounter: Payer: Self-pay | Admitting: Interventional Cardiology

## 2021-06-06 ENCOUNTER — Encounter: Payer: Self-pay | Admitting: Cardiology

## 2021-06-10 ENCOUNTER — Encounter: Payer: Self-pay | Admitting: Psychiatry

## 2021-06-10 ENCOUNTER — Telehealth: Payer: Self-pay

## 2021-06-10 ENCOUNTER — Telehealth (INDEPENDENT_AMBULATORY_CARE_PROVIDER_SITE_OTHER): Payer: 59 | Admitting: Psychiatry

## 2021-06-10 DIAGNOSIS — F330 Major depressive disorder, recurrent, mild: Secondary | ICD-10-CM

## 2021-06-10 DIAGNOSIS — F4310 Post-traumatic stress disorder, unspecified: Secondary | ICD-10-CM | POA: Diagnosis not present

## 2021-06-10 MED ORDER — QUETIAPINE FUMARATE 50 MG PO TABS: 50.0000 mg | ORAL_TABLET | Freq: Every day | ORAL | 0 refills | Status: DC

## 2021-06-10 MED ORDER — BUPROPION HCL ER (XL) 150 MG PO TB24: 150.0000 mg | ORAL_TABLET | Freq: Every day | ORAL | 0 refills | Status: DC

## 2021-06-10 MED ORDER — CITALOPRAM HYDROBROMIDE 40 MG PO TABS: 40.0000 mg | ORAL_TABLET | Freq: Every morning | ORAL | 1 refills | Status: DC

## 2021-06-10 NOTE — Telephone Encounter (Signed)
received fax requestig a refill on the quetiapine

## 2021-06-10 NOTE — Telephone Encounter (Signed)
Will plan to fill after today's visit

## 2021-06-10 NOTE — Patient Instructions (Signed)
1. Continue Citalopram 40 mg daily 2. Continue bupropion 150 mg daily 3. Continue quetiapine 50 mg at night  4. Next appointment: 2/13 at 2:30  The next visit will be in person visit. Please arrive 15 mins before the scheduled time.   [facility]  Address: [address], [place], [place] [address]

## 2021-06-10 NOTE — Progress Notes (Signed)
Virtual Visit via Video Note  I connected with Dalton [name] on 06/10/21 at  1:00 PM EST by a video enabled telemedicine application and verified that I am speaking with the correct person using two identifiers.  Location: Patient: car Provider: office Persons participated in the visit- patient, provider    I discussed the limitations of evaluation and management by telemedicine and the availability of in person appointments. The patient expressed understanding and agreed to proceed.    I discussed the assessment and treatment plan with the patient. The patient was provided an opportunity to ask questions and all were answered. The patient agreed with the plan and demonstrated an understanding of the instructions.   The patient was advised to call back or seek an in-person evaluation if the symptoms worsen or if the condition fails to improve as anticipated.  I provided 20 minutes of non-face-to-face time during this encounter.   Dalton Clay, MD    [facility] MD/PA/NP OP Progress Note  06/10/2021 1:30 PM Dalton [name]  MRN:  [id number]  Chief Complaint:  Chief Complaint   Follow-up; Depression; Trauma    HPI:  This is a follow-up appointment for depression and PTSD.   He states that he is helping his wife, who has memory loss since [place].  She has seizures, and tends to be sleepy during the day.  He checks with her a few times during the day.  He also feels that he should be there rather than coming to work, although he is aware that he needs to work for living.  It has been hard.  His daughter has been helpful to take care of his wife.  He reports good relationship with his wife otherwise.  He denies significant issues at work.  He was seen by cardiologist, who commented that his condition is good.  He continues to have insomnia, and contacted the provider for sleep evaluation.  He feels fatigue.  He feels good about weight loss since cardiac surgery while he has good appetite.   He has fair concentration.  He feels depressed at times due to the situation with his wife.  He denies SI.  He denies nightmares.  He has flashback and hypervigilance.  Of note, he was looking above during the interview; he thought there was a sound of helicopter.  He agrees that it happens when he hears the sound of helicopter.  He drinks 1-2 beers only occasionally.  He denies drug use.  Although quetiapine has not helped much for insomnia, he thinks his depression has been better.  He has been trying to stay positive.  He feels comfortable to stay on the same regimen.    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 He was in [place] after 911, no combat experience Support: wife Household: Dalton [name]/his wife Marital status: married with his wife of six years Number of children: 2 daughters, age 5, 66, grandchild, 63 in march,   Visit Diagnosis:    ICD-10-CM   1. PTSD (post-traumatic stress disorder)  F43.10     2. Mild episode of recurrent major depressive disorder (HCC)  F33.0       Past Psychiatric History: Please see  initial evaluation for full details. I have reviewed the history. No updates at this time.     Past Medical History:  Past Medical History:  Diagnosis Date   Aortic valve stenosis    a. Bicuspid AV - 2D Echo 06/2014 - mod AS, mild AI, mildly dilated aortic root.   Arthritis    Neck   CAD (coronary artery disease)    Carotid artery disease ([place])    a. Mild by duplex 05/2015 - 1-39% BICA. Repeat due 05/2017.   Depression    Diabetes mellitus without complication ([place])    Dilated aortic root ([place])    a. By echo 06/2014.   Dyspnea    with exercion   Dysrhythmia    Atrial Fibrilation   Environmental allergies     Headache    MIgraines- rare   Heart murmur    Hypercholesterolemia    Hypertension    Obesity (BMI 30-39.9) 04/26/2018   PTSD (post-traumatic stress disorder)    Sleep apnea    CPAP   SVT (supraventricular tachycardia) ([place])    a. h/o SVT - Ablation done 2013.    Past Surgical History:  Procedure Laterality Date   AORTIC VALVE REPLACEMENT N/A 10/04/2020   Procedure: AORTIC VALVE REPLACEMENT (AVR);  Surgeon: Gaye Pollack, MD;  Location: [place];  Service: Open Heart Surgery;  Laterality: N/A;   CARDIAC ELECTROPHYSIOLOGY STUDY AND ABLATION  08/21/2011   COLONOSCOPY WITH PROPOFOL N/A 06/26/2017   Procedure: COLONOSCOPY WITH PROPOFOL;  Surgeon: Manya Silvas, MD;  Location: [facility] ENDOSCOPY;  Service: Endoscopy;  Laterality: N/A;   ESOPHAGOGASTRODUODENOSCOPY (EGD) WITH PROPOFOL N/A 06/26/2017   Procedure: ESOPHAGOGASTRODUODENOSCOPY (EGD) WITH PROPOFOL;  Surgeon: Manya Silvas, MD;  Location[facility] ENDOSCOPY;  Service: Endoscopy;  Laterality: N/A;   MAZE N/A 10/04/2020   Procedure: MAZE;  Surgeon: Gaye Pollack, MD;  Location: [place];  Service: Open Heart Surgery;  Laterality: N/A;   RIGHT/LEFT HEART CATH AND CORONARY ANGIOGRAPHY N/A 11/16/2019   Procedure: RIGHT/LEFT HEART CATH AND CORONARY ANGIOGRAPHY;  Surgeon: [name], MD;  Location: [place] CV LAB;  Service: Cardiovascular;  Laterality: N/A;   SEPTOPLASTY N/A 03/22/2015   Procedure: SEPTOPLASTY  WITH RIGHT INFERIOR TURBINATE REDUCTION;  Surgeon: Margaretha Sheffield, MD;  Location: [place];  Service: ENT;  Laterality: N/A;   SUPRAVENTRICULAR TACHYCARDIA ABLATION N/A 08/21/2011   Procedure: SUPRAVENTRICULAR TACHYCARDIA ABLATION;  Surgeon: Evans Lance, MD;  Location: [facility] CATH LAB;  Service: Cardiovascular;  Laterality: N/A;   surgery for non descending testicle     TEE WITHOUT CARDIOVERSION N/A 11/16/2019   Procedure: TRANSESOPHAGEAL ECHOCARDIOGRAM (TEE);  Surgeon: Geralynn Rile, MD;  Location: [place];   Service: Cardiovascular;  Laterality: N/A;   TEE WITHOUT CARDIOVERSION N/A 10/04/2020   Procedure: TRANSESOPHAGEAL ECHOCARDIOGRAM (TEE);  Surgeon: Gaye Pollack, MD;  Location: [place];  Service: Open Heart Surgery;  Laterality: N/A;   TONSILLECTOMY      Family Psychiatric History: Please see initial evaluation for full details. I have reviewed the history. No updates at this time.     Family History:  Family History  Problem Relation Age of Onset   Diabetes Mother    Heart disease Mother    Arthritis Mother    Heart attack Father    Kidney disease Father    Cancer Father    Breast cancer Maternal Grandmother    Cancer Maternal Grandfather    Aortic stenosis Maternal Grandfather     Social History:  Social History   Socioeconomic History   Marital status: Married    Spouse name: Not on file   Number of children: 2   Years of education: Not on file   Highest education level: Not on file  Occupational History   Occupation: truck driver    Employer: [employer]  Tobacco Use   Smoking status: Former    Types: Cigarettes    Quit date: 11/04/1988    Years since quitting: 32.6   Smokeless tobacco: Former  Scientific laboratory technician Use: Never used  Substance and Sexual Activity   Alcohol use: Yes    Comment: occasionally   Drug use: Not Currently    Comment: many years ago in highschool   Sexual activity: Not Currently  Other Topics Concern   Not on file  Social History Narrative   Very rare exercise   Social Determinants of Health   Financial Resource Strain: Low Risk    Difficulty of Paying Living Expenses: Not very hard  Food Insecurity: Not on file  Transportation Needs: Not on file  Physical Activity: Not on file  Stress: Not on file  Social Connections: Not on file    Allergies: No Known Allergies  Metabolic Disorder Labs: Lab Results  Component Value Date   HGBA1C 6.1 01/24/2021   MPG 128.37 09/20/2020   No results found for: PROLACTIN Lab Results   Component Value Date   CHOL 137 04/26/2020   TRIG 136.0 04/26/2020   HDL 35.90 (L) 04/26/2020   CHOLHDL 4 04/26/2020   VLDL 27.2 04/26/2020   LDLCALC 74 04/26/2020   LDLCALC 84 12/26/2019   Lab Results  Component Value Date   TSH 1.22 06/09/2019   TSH 1.56 05/14/2018    Therapeutic Level Labs: No results found for: LITHIUM No results found for: VALPROATE No components found for:  CBMZ  Current Medications: Current Outpatient Medications  Medication Sig Dispense Refill   acetaminophen (TYLENOL) 500 MG tablet Take 1,000 mg by mouth every 6 (six) hours as needed for moderate pain or headache.     apixaban (ELIQUIS) 5 MG TABS tablet Take 1 tablet (5 mg total) by mouth 2 (two) times daily. 180 tablet 3   atorvastatin (LIPITOR) 40 MG tablet Take 1 tablet (40 mg total) by mouth daily. 90 tablet 3   [START ON 07/08/2021] buPROPion (WELLBUTRIN XL) 150 MG 24 hr tablet Take 1 tablet (150 mg total) by mouth daily. 90 tablet 0   cetirizine (ZYRTEC) 10 MG tablet Take 10 mg by mouth 2 (two) times daily.      [START ON 06/17/2021] citalopram (CELEXA) 40 MG tablet Take 1 tablet (40 mg total) by mouth every morning. 90 tablet 1   CONTOUR NEXT TEST test strip USE AS DIRECTED TO CHECK BLOOD SUGARS TWICE DAILY 100 strip 1   digoxin (LANOXIN) 0.125 MG tablet Take 1 tablet (0.125 mg total) by mouth daily. 90 tablet 3   FARXIGA 5 MG TABS tablet TAKE 1 TABLET(5 MG) BY MOUTH DAILY BEFORE AND BREAKFAST 30 tablet 2   fluticasone (FLONASE) 50 MCG/ACT nasal spray Place 2 sprays into both nostrils daily as needed for allergies or rhinitis.     Lancets (ONETOUCH DELICA PLUS [id number]) MISC USE TWICE DAILY AS DIRECTED 100 each 7   metFORMIN (GLUCOPHAGE) 500 MG tablet Take 1 tablet (500 mg total) by mouth 2 (two) times daily. 180 tablet 1   metoprolol succinate (TOPROL XL) 25 MG 24 hr tablet Take 1 tablet (25 mg total)  by mouth daily. 90 tablet 3   NON FORMULARY as directed. CPAP     QUEtiapine (SEROQUEL) 50 MG  tablet Take 1 tablet (50 mg total) by mouth at bedtime. 90 tablet 0   sacubitril-valsartan (ENTRESTO) 24-26 MG Take 1 tablet by mouth 2 (two) times daily. 60 tablet 11   Semaglutide, 1 MG/DOSE, (OZEMPIC, 1 MG/DOSE,) 4 MG/3ML SOPN Inject 1 mg into the skin once a week. 3 mL 2   No current facility-administered medications for this visit.     Musculoskeletal: Strength & Muscle Tone:  N/A Gait & Station:  N/A Patient leans: N/A  Psychiatric Specialty Exam: Review of Systems  Psychiatric/Behavioral:  Positive for dysphoric mood and sleep disturbance. Negative for agitation, behavioral problems, confusion, decreased concentration, hallucinations, self-injury and suicidal ideas. The patient is nervous/anxious. The patient is not hyperactive.   All other systems reviewed and are negative.  There were no vitals taken for this visit.There is no height or weight on file to calculate BMI.  General Appearance: Fairly Groomed  Eye Contact:  Good  Speech:  Clear and Coherent  Volume:  Normal  Mood:  Anxious  Affect:  Appropriate, Congruent, and calm  Thought Process:  Coherent  Orientation:  Full (Time, Place, and Person)  Thought Content: Logical   Suicidal Thoughts:  No  Homicidal Thoughts:  No  Memory:  Immediate;   Good  Judgement:  Good  Insight:  Good  Psychomotor Activity:  Normal  Concentration:  Concentration: Good and Attention Span: Good  Recall:  Good  Fund of Knowledge: Good  Language: Good  Akathisia:  No  Handed:  Right  AIMS (if indicated): not done  Assets:  Communication Skills Desire for Improvement  ADL's:  Intact  Cognition: WNL  Sleep:  Poor   Screenings: GAD-7    Flowsheet Row Counselor from 07/27/2020 in [place]  Total GAD-7 Score 13      PHQ2-9    Flowsheet Row Counselor from 04/08/2021 in [place] from 01/28/2021 in [place] from  12/18/2020 in [place] from 07/27/2020 in [place] from 03/29/2019 in [place]  PHQ-2 Total Score 3 4 4 3  0  PHQ-9 Total Score 6 20 17 12  0      Flowsheet Row Counselor from 05/14/2021 in [place] from 03/05/2021 in [place] Counselor from 01/28/2021 in [place] No Risk Low Risk Low Risk        Assessment and Plan:  CORDARRYL MONRREAL is a 55 y.o. 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   1. PTSD (post-traumatic stress disorder) 2. Mild episode of recurrent major depressive disorder (HCC) There has been overall improvement in PTSD and depressive symptoms since up titration of quetiapine.  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  Will continue citalopram and quetiapine to target PTSD and depression.  Will continue bupropion as adjunctive treatment for depression.      # Parasomnia # Insomnia Worsening, he has contacted the provider for sleep evaluation.  This clinician has discussed the side effect associated with medication prescribed during this encounter. Please refer to notes in the previous encounters for more details.     Plan   1. Continue Citalopram 40 mg daily 2. Continue bupropion 150 mg daily 3. Continue quetiapine 50 mg at night - monitor drowsiness (QTc 405 msec 10/2020) 4. Next appointment: 2/13 at 2:30 for 30 mins, in person    Past  trials of medication: citalopram, lamotrigine, quetiapine, Trazodone,    The patient demonstrates the following risk factors for suicide: Chronic risk factors for suicide include: psychiatric disorder of depression, PTSD. Acute risk factors for suicide include: N/A. Protective factors for this patient include: positive social support, responsibility to others (children, family), coping skills and hope for the future. Considering these factors, the overall suicide risk at this point appears to be low. Patient is appropriate for outpatient follow up.      Dalton Clay, MD 06/10/2021, 1:30 PM

## 2021-06-18 ENCOUNTER — Other Ambulatory Visit: Payer: Self-pay | Admitting: Psychiatry
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
## 2021-06-20 NOTE — Patient Instructions (Signed)
Below is our plan:  We will continue to monitor symptoms. Stay active. Ask Dr Radford Pax about a new CPAP machine. Make sure apnea is well managed. Continue psychiatry follow up.   Please make sure you are staying well hydrated. I recommend 50-60 ounces daily. Well balanced diet and regular exercise encouraged. Consistent sleep schedule with 6-8 hours recommended.   Please continue follow up with care team as directed.   Follow up with [place] as needed   You may receive a survey regarding today's visit. I encourage you to leave honest feed back as I do use this information to improve patient care. Thank you for seeing me today!

## 2021-06-24 ENCOUNTER — Ambulatory Visit (INDEPENDENT_AMBULATORY_CARE_PROVIDER_SITE_OTHER): Payer: 59 | Admitting: Internal Medicine

## 2021-06-24 ENCOUNTER — Other Ambulatory Visit: Payer: Self-pay

## 2021-06-24 ENCOUNTER — Encounter: Payer: Self-pay | Admitting: Family Medicine

## 2021-06-24 ENCOUNTER — Ambulatory Visit (INDEPENDENT_AMBULATORY_CARE_PROVIDER_SITE_OTHER): Payer: 59 | Admitting: Family Medicine

## 2021-06-24 VITALS — BP 127/79 | HR 83 | Ht 73.0 in | Wt 240.0 lb

## 2021-06-24 DIAGNOSIS — I10 Essential (primary) hypertension: Secondary | ICD-10-CM | POA: Diagnosis not present

## 2021-06-24 DIAGNOSIS — K3189 Other diseases of stomach and duodenum: Secondary | ICD-10-CM

## 2021-06-24 DIAGNOSIS — R5381 Other malaise: Secondary | ICD-10-CM

## 2021-06-24 DIAGNOSIS — E1165 Type 2 diabetes mellitus with hyperglycemia: Secondary | ICD-10-CM

## 2021-06-24 DIAGNOSIS — I4891 Unspecified atrial fibrillation: Secondary | ICD-10-CM

## 2021-06-24 DIAGNOSIS — K766 Portal hypertension: Secondary | ICD-10-CM

## 2021-06-24 DIAGNOSIS — F4310 Post-traumatic stress disorder, unspecified: Secondary | ICD-10-CM

## 2021-06-24 DIAGNOSIS — I5042 Chronic combined systolic (congestive) and diastolic (congestive) heart failure: Secondary | ICD-10-CM | POA: Diagnosis not present

## 2021-06-24 DIAGNOSIS — G4733 Obstructive sleep apnea (adult) (pediatric): Secondary | ICD-10-CM

## 2021-06-24 DIAGNOSIS — E041 Nontoxic single thyroid nodule: Secondary | ICD-10-CM

## 2021-06-24 DIAGNOSIS — Z952 Presence of prosthetic heart valve: Secondary | ICD-10-CM

## 2021-06-24 DIAGNOSIS — E785 Hyperlipidemia, unspecified: Secondary | ICD-10-CM

## 2021-06-24 NOTE — Progress Notes (Signed)
Patient ID: AADITH RAUDENBUSH, male   DOB: [birth year], 55 y.o.   MRN: [id number]   Subjective:    Patient ID: BOLDEN HAGERMAN, male    DOB: 03/05/1966, 55 y.o.   MRN: [id number]  This visit occurred during the SARS-CoV-2 public health emergency.  Safety protocols were in place, including screening questions prior to the visit, additional usage of staff PPE, and extensive cleaning of exam room while observing appropriate contact time as indicated for disinfecting solutions.   Patient here for a scheduled follow up.    Chief Complaint  Patient presents with   Diabetes   Hyperlipidemia   Hypertension   .   HPI Reports sugars are doing well.  Reports 90 day average 99.  (30 day average - 106).  Discussed diet and exercise.  Seeing psychiatry for PTSD and documented depression.  Appears to be doing relatively well on current medication regimen.  Has known sleep apnea.  Having issues with his equipment.  States when in deeper sleep - twitches, "fighting" - wakes up.  Sees Dr Radford Pax.  Apparently in the process of getting a new cpap machine.  Is s/p AVR 09/2020.  Sees Dr Tamala Julian.  Stable - on toprol and entresto.  Also takes lanoxin.  No chest pain.  Breathing stable.  No nausea or vomiting reported.     Past Medical History:  Diagnosis Date   Aortic valve stenosis    a. Bicuspid AV - 2D Echo 06/2014 - mod AS, mild AI, mildly dilated aortic root.   Arthritis    Neck   CAD (coronary artery disease)    Carotid artery disease ([place])    a. Mild by duplex 05/2015 - 1-39% BICA. Repeat due 05/2017.   Depression    Diabetes mellitus without complication ([place])    Dilated aortic root ([place])    a. By echo 06/2014.   Dyspnea    with exercion   Dysrhythmia    Atrial Fibrilation   Environmental allergies    Headache    MIgraines- rare   Heart murmur    Hypercholesterolemia    Hypertension    Obesity (BMI 30-39.9) 04/26/2018   PTSD (post-traumatic stress disorder)    Sleep apnea    CPAP   SVT  (supraventricular tachycardia) ([place])    a. h/o SVT - Ablation done 2013.   Past Surgical History:  Procedure Laterality Date   AORTIC VALVE REPLACEMENT N/A 10/04/2020   Procedure: AORTIC VALVE REPLACEMENT (AVR);  Surgeon: Gaye Pollack, MD;  Location: [place];  Service: Open Heart Surgery;  Laterality: N/A;   CARDIAC ELECTROPHYSIOLOGY STUDY AND ABLATION  08/21/2011   COLONOSCOPY WITH PROPOFOL N/A 06/26/2017   Procedure: COLONOSCOPY WITH PROPOFOL;  Surgeon: Manya Silvas, MD;  Location: [facility] ENDOSCOPY;  Service: Endoscopy;  Laterality: N/A;   ESOPHAGOGASTRODUODENOSCOPY (EGD) WITH PROPOFOL N/A 06/26/2017   Procedure: ESOPHAGOGASTRODUODENOSCOPY (EGD) WITH PROPOFOL;  Surgeon: Manya Silvas, MD;  Location: [facility] ENDOSCOPY;  Service: Endoscopy;  Laterality: N/A;   MAZE N/A 10/04/2020   Procedure: MAZE;  Surgeon: Gaye Pollack, MD;  Location: [place];  Service: Open Heart Surgery;  Laterality: N/A;   RIGHT/LEFT HEART CATH AND CORONARY ANGIOGRAPHY N/A 11/16/2019   Procedure: RIGHT/LEFT HEART CATH AND CORONARY ANGIOGRAPHY;  Surgeon: [name], MD;  Location: [place] [facility];  Service: Cardiovascular;  Laterality: N/A;   SEPTOPLASTY N/A 03/22/2015   Procedure: SEPTOPLASTY  WITH RIGHT INFERIOR TURBINATE REDUCTION;  Surgeon: Margaretha Sheffield, MD;  Location: [place];  Service: ENT;  Laterality: N/A;   SUPRAVENTRICULAR TACHYCARDIA ABLATION N/A 08/21/2011   Procedure: SUPRAVENTRICULAR TACHYCARDIA ABLATION;  Surgeon: Evans Lance, MD;  Location[facility] C[facility] LAB;  Service: Cardiovascular;  Laterality: N/A;   surgery for non descending testicle     TEE WITHOUT CARDIOVERSION N/A 11/16/2019   Procedure: TRANSESOPHAGEAL ECHOCARDIOGRAM (TEE);  Surgeon: Geralynn Rile, MD;  Location: [place];  Service: Cardiovascular;  Laterality: N/A;   TEE WITHOUT CARDIOVERSION N/A 10/04/2020   Procedure: TRANSESOPHAGEAL ECHOCARDIOGRAM (TEE);  Surgeon: Gaye Pollack, MD;  Location: [place];  Service: Open  Heart Surgery;  Laterality: N/A;   TONSILLECTOMY     Family History  Problem Relation Age of Onset   Diabetes Mother    Heart disease Mother    Arthritis Mother    Heart attack Father    Kidney disease Father    Cancer Father    Breast cancer Maternal Grandmother    Cancer Maternal Grandfather    Aortic stenosis Maternal Grandfather    Social History   Socioeconomic History   Marital status: Married    Spouse name: Not on file   Number of children: 2   Years of education: Not on file   Highest education level: Not on file  Occupational History   Occupation: truck Education administrator: [employer]  Tobacco Use   Smoking status: Former    Types: Cigarettes    Quit date: 11/04/1988    Years since quitting: 32.6   Smokeless tobacco: Former  Scientific laboratory technician Use: Never used  Substance and Sexual Activity   Alcohol use: Yes    Comment: occasionally   Drug use: Not Currently    Comment: many years ago in highschool   Sexual activity: Not Currently  Other Topics Concern   Not on file  Social History Narrative   Very rare exercise   Social Determinants of Health   Financial Resource Strain: Low Risk    Difficulty of Paying Living Expenses: Not very hard  Food Insecurity: Not on file  Transportation Needs: Not on file  Physical Activity: Not on file  Stress: Not on file  Social Connections: Not on file     Review of Systems  Constitutional:  Negative for appetite change and unexpected weight change.  HENT:  Negative for congestion and sinus pressure.   Respiratory:  Negative for cough and chest tightness.        Breathing stable.    Cardiovascular:  Negative for chest pain, palpitations and leg swelling.  Gastrointestinal:  Negative for abdominal pain, diarrhea, nausea and vomiting.  Genitourinary:  Negative for difficulty urinating and dysuria.  Musculoskeletal:  Negative for joint swelling and myalgias.  Skin:  Negative for color change and rash.   Neurological:  Negative for dizziness, light-headedness and headaches.  Psychiatric/Behavioral:  Negative for agitation and dysphoric mood.       Objective:     BP 116/70    Pulse 80    Temp 98.1 F (36.7 C)    Resp 16    Ht 6' (1.829 m)    Wt 243 lb 12.8 oz (110.6 kg)    SpO2 98%    BMI 33.07 kg/m  Wt Readings from Last 3 Encounters:  06/24/21 243 lb 12.8 oz (110.6 kg)  06/24/21 240 lb (108.9 kg)  05/13/21 243 lb 6.4 oz (110.4 kg)    Physical Exam Vitals reviewed.  Constitutional:      General: He is not in  acute distress.    Appearance: Normal appearance. He is well-developed.  HENT:     Head: Normocephalic and atraumatic.     Right Ear: External ear normal.     Left Ear: External ear normal.  Eyes:     General: No scleral icterus.       Right eye: No discharge.        Left eye: No discharge.     Conjunctiva/sclera: Conjunctivae normal.  Cardiovascular:     Rate and Rhythm: Normal rate and regular rhythm.     Comments: 1/6 systolic murmur Pulmonary:     Effort: Pulmonary effort is normal. No respiratory distress.     Breath sounds: Normal breath sounds.  Abdominal:     General: Bowel sounds are normal.     Palpations: Abdomen is soft.     Tenderness: There is no abdominal tenderness.  Musculoskeletal:        General: No swelling or tenderness.     Cervical back: Neck supple. No tenderness.  Lymphadenopathy:     Cervical: No cervical adenopathy.  Skin:    Findings: No erythema or rash.  Neurological:     Mental Status: He is alert.  Psychiatric:        Mood and Affect: Mood normal.        Behavior: Behavior normal.     Outpatient Encounter Medications as of 06/24/2021  Medication Sig   acetaminophen (TYLENOL) 500 MG tablet Take 1,000 mg by mouth every 6 (six) hours as needed for moderate pain or headache.   apixaban (ELIQUIS) 5 MG TABS tablet Take 1 tablet (5 mg total) by mouth 2 (two) times daily.   atorvastatin (LIPITOR) 40 MG tablet Take 1 tablet (40  mg total) by mouth daily.   [START ON 07/08/2021] buPROPion (WELLBUTRIN XL) 150 MG 24 hr tablet Take 1 tablet (150 mg total) by mouth daily.   cetirizine (ZYRTEC) 10 MG tablet Take 10 mg by mouth 2 (two) times daily.    citalopram (CELEXA) 40 MG tablet Take 1 tablet (40 mg total) by mouth every morning.   CONTOUR NEXT TEST test strip USE AS DIRECTED TO CHECK BLOOD SUGARS TWICE DAILY   digoxin (LANOXIN) 0.125 MG tablet Take 1 tablet (0.125 mg total) by mouth daily.   FARXIGA 5 MG TABS tablet TAKE 1 TABLET(5 MG) BY MOUTH DAILY BEFORE AND BREAKFAST   fluticasone (FLONASE) 50 MCG/ACT nasal spray Place 2 sprays into both nostrils daily as needed for allergies or rhinitis.   Lancets (ONETOUCH DELICA PLUS [id number]) MISC USE TWICE DAILY AS DIRECTED   metFORMIN (GLUCOPHAGE) 500 MG tablet Take 1 tablet (500 mg total) by mouth 2 (two) times daily.   metoprolol succinate (TOPROL XL) 25 MG 24 hr tablet Take 1 tablet (25 mg total) by mouth daily.   NON FORMULARY as directed. CPAP   QUEtiapine (SEROQUEL) 50 MG tablet Take 1 tablet (50 mg total) by mouth at bedtime.   sacubitril-valsartan (ENTRESTO) 24-26 MG Take 1 tablet by mouth 2 (two) times daily.   [DISCONTINUED] Semaglutide, 1 MG/DOSE, (OZEMPIC, 1 MG/DOSE,) 4 MG/3ML SOPN Inject 1 mg into the skin once a week.   No facility-administered encounter medications on file as of 06/24/2021.     Lab Results  Component Value Date   WBC 6.3 01/24/2021   HGB 15.4 01/24/2021   HCT 47.1 01/24/2021   PLT 163.0 01/24/2021   GLUCOSE 158 (H) 01/24/2021   CHOL 137 04/26/2020   TRIG 136.0 04/26/2020   HDL  35.90 (L) 04/26/2020   LDLDIRECT 228.0 05/14/2018   LDLCALC 74 04/26/2020   ALT 21 01/24/2021   AST 13 01/24/2021   NA 137 01/24/2021   K 4.6 01/24/2021   CL 99 01/24/2021   CREATININE 1.12 01/24/2021   BUN 13 01/24/2021   CO2 26 01/24/2021   TSH 1.22 06/09/2019   PSA 0.72 05/14/2018   INR 1.4 (H) 10/04/2020   HGBA1C 6.1 01/24/2021   MICROALBUR 1.1  06/09/2019    MR ANGIO HEAD WO CONTRAST  Result Date: 12/16/2020  [facility] [address] [place], [place] [address] [phone] NEUROIMAGING REPORT STUDY DATE: 12/15/2020 PATIENT NAME: TAKUYA LARICCIA DOB: 1966/04/19 MRN: [id number] EXAM: MR angiogram of the intracranial arteries ORDERING CLINICIAN: Asencion Partridge Dohmeier MD CLINICAL HISTORY: 55 year old man with vertigo and aphasia COMPARISON FILMS: None TECHNIQUE: MR angiogram of the head was obtained utilizing 3D time of flight sequences from below the vertebrobasilar junction up to the intracranial vasculature without contrast.  Computerized reconstructions were obtained. CONTRAST: None IMAGING SITE: [facility], [place], [place] FINDINGS: Source and reconstructed views were evaluated.  The imaged extracranial and intracranial segments of the internal carotid arteries appear normal. The middle cerebral and anterior cerebral arteries appear normal. In the posterior circulation, the vertebral arteries are co-dominant. No stenosis is noted within the vertebral arteries and the basilar arteries. The posterior cerebral arteries appear normal.  Posterior communicating arteries are noted, larger on the right. No aneurysms were identified.   This is a normal MR angiogram of the intracranial arteries. INTERPRETING PHYSICIAN: Richard A. Felecia Shelling, MD, PhD, FAAN Certified in  Neuroimaging by [employer]   MR BRAIN W WO CONTRAST  Result Date: 12/16/2020  [facility] [address] [place], [place] [address] [phone] NEUROIMAGING REPORT STUDY DATE: 12/15/2020 PATIENT NAME: MANNIX KROEKER DOB: [birth year] MRN: [id number] EXAM: MRI Brain with and without contrast ORDERING CLINICIAN: Asencion Partridge Dohmeier MD CLINICAL HISTORY: 55 year old man with vertigo and aphasia COMPARISON FILMS: None TECHNIQUE:MRI of the brain with and without contrast was obtained utilizing 5 mm axial slices  with T1, T2, T2 flair, SWI and diffusion weighted views.  T1 sagittal, T2 coronal and postcontrast views in the axial and coronal plane were obtained. CONTRAST: 20 ml [employer] IMAGING SITE: [facility], [place]. FINDINGS: On sagittal images, the spinal cord is imaged caudally to C3-C4 and is normal in caliber.   The contents of the posterior fossa are of normal size and position.   The pituitary gland and optic chiasm appear normal.    Brain volume appears normal.   The ventricles are normal in size and without distortion.  There are no abnormal extra-axial collections of fluid.  The falx cerebri is heavily calcified with central T1 hyperintensity consistent with marrow.   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  Multiple cerebral expanded Virchow-Robin spaces are noted.  The cerebellum and brainstem appears normal.   The deep gray matter appears normal.    Diffusion weighted images are normal.  Susceptibility weighted images are normal.   The orbits appear normal.   The VIIth/VIIIth nerve complex appears normal.  The mastoid air cells appear normal.  The paranasal sinuses appear normal.  Flow voids are identified within the major intracerebral arteries.  After the infusion  of contrast, a small left cerebellar developmental venous anomaly is noted   This MRI of the brain with and without contrast shows the following: 1.   No acute findings. 2.   Few punctate T2/FLAIR hyperintense foci in the hemispheres consistent with minimal chronic microvascular ischemic change, typical for age.  There were no medium or large vessel strokes. 3.   Incidental note of ossification of the falx cerebri, multiple expanded Virchow-Robin spaces in the cerebral hemispheres, a Tornwaldt cyst and a small left  cerebellar developmental venous anomaly.  These are not expected to be clinically significant. INTERPRETING PHYSICIAN: Richard A. Felecia Shelling, MD, PhD, FAAN Certified in  Neuroimaging by [employer] [employer]       Assessment & Plan:   Problem List Items Addressed This Visit     Atrial fibrillation ([place])    On eliquis.  Followed by cardiology.  Stable.       Chronic combined systolic and diastolic heart failure (H[facility])    No evidence of volume overload.  Continue entresto and metoprolol.  Follow.       Essential hypertension    Blood pressure doing well.  Continue entresto and metoprolol.  Follow pressures.  Follow metabolic panel.       Hyperlipidemia    Low cholesterol diet and exercise.  On lipitor.  Follow lipid panel and liver function tests.        Portal hypertensive gastropathy ([place])    Found on EGD 06/2017.  No upper symptoms.  Fatty liver on ultrasound.        PTSD (post-traumatic stress disorder)    Seeing Dr Nicolasa Ducking.  Doing well on seroquel and celexa.  Follow.       S/P AVR    Followed by cardiology as outlined.        Sleep apnea    Having issues with cpap and sleep as outlined.  Followed by Dr Radford Pax.  Waiting for new cpap.        Thyroid nodule    S/p thyroid biopsy - pathology - benign follicular nodule.        Type 2 diabetes mellitus with hyperglycemia (HCC)    Sugars as outlined.  Doing well.  Ozempic.  Follow met b and a1c.       Relevant Orders   Microalbumin / creatinine urine ratio     Einar Pheasant, MD

## 2021-06-27 ENCOUNTER — Other Ambulatory Visit: Payer: Self-pay

## 2021-06-27 ENCOUNTER — Ambulatory Visit (INDEPENDENT_AMBULATORY_CARE_PROVIDER_SITE_OTHER): Payer: Self-pay | Admitting: Licensed Clinical Social Worker

## 2021-06-27 DIAGNOSIS — Z91199 Patient's noncompliance with other medical treatment and regimen due to unspecified reason: Secondary | ICD-10-CM

## 2021-06-27 NOTE — Progress Notes (Signed)
LCSW counselor tried to connect with patient for scheduled appointment via MyChart video text request x 2 and email request; also tried to connect via phone without success. LCSW counselor left message for patient to call office number to reschedule OPT appointment.

## 2021-07-02 ENCOUNTER — Telehealth: Payer: Self-pay | Admitting: *Deleted

## 2021-07-02 DIAGNOSIS — E1165 Type 2 diabetes mellitus with hyperglycemia: Secondary | ICD-10-CM

## 2021-07-02 DIAGNOSIS — E785 Hyperlipidemia, unspecified: Secondary | ICD-10-CM

## 2021-07-02 DIAGNOSIS — I10 Essential (primary) hypertension: Secondary | ICD-10-CM

## 2021-07-02 NOTE — Telephone Encounter (Signed)
Please place future orders for lab appt.  

## 2021-07-03 ENCOUNTER — Other Ambulatory Visit: Payer: Self-pay | Admitting: Internal Medicine

## 2021-07-03 DIAGNOSIS — E1165 Type 2 diabetes mellitus with hyperglycemia: Secondary | ICD-10-CM

## 2021-07-04 NOTE — Telephone Encounter (Signed)
Future labs ordered.  

## 2021-07-05 ENCOUNTER — Encounter: Payer: Self-pay | Admitting: Internal Medicine

## 2021-07-05 NOTE — Assessment & Plan Note (Signed)
S/p thyroid biopsy - pathology - benign follicular nodule.

## 2021-07-05 NOTE — Assessment & Plan Note (Signed)
Blood pressure doing well.  Continue entresto and metoprolol.  Follow pressures.  Follow metabolic panel.  

## 2021-07-05 NOTE — Assessment & Plan Note (Signed)
Sugars as outlined.  Doing well.  Ozempic.  Follow met b and a1c.

## 2021-07-05 NOTE — Assessment & Plan Note (Signed)
Low cholesterol diet and exercise.  On lipitor.  Follow lipid panel and liver function tests.   

## 2021-07-05 NOTE — Assessment & Plan Note (Signed)
Having issues with cpap and sleep as outlined.  Followed by Dr Radford Pax.  Waiting for new cpap.

## 2021-07-05 NOTE — Assessment & Plan Note (Addendum)
Seeing Dr Nicolasa Ducking.  Doing well on seroquel and celexa.  Follow.

## 2021-07-05 NOTE — Assessment & Plan Note (Signed)
No evidence of volume overload.  Continue entresto and metoprolol.  Follow.  

## 2021-07-05 NOTE — Assessment & Plan Note (Signed)
Found on EGD 06/2017.  No upper symptoms.  Fatty liver on ultrasound.

## 2021-07-05 NOTE — Assessment & Plan Note (Signed)
Followed by cardiology as outlined.

## 2021-07-05 NOTE — Assessment & Plan Note (Signed)
On eliquis.  Followed by cardiology.  Stable.

## 2021-07-06 ENCOUNTER — Other Ambulatory Visit: Payer: Self-pay | Admitting: Psychiatry

## 2021-07-11 ENCOUNTER — Other Ambulatory Visit: Payer: 59

## 2021-07-15 ENCOUNTER — Other Ambulatory Visit: Payer: Self-pay | Admitting: Interventional Cardiology

## 2021-07-15 DIAGNOSIS — I4891 Unspecified atrial fibrillation: Secondary | ICD-10-CM

## 2021-07-15 NOTE — Telephone Encounter (Signed)
Eliquis 5mg  refill request received. Patient is 56 years old, weight-110.6kg, Crea-1.12 on 01/24/2021, Diagnosis-Afib, and last seen by Dr. Tamala Julian on  05/13/2021. Dose is appropriate based on dosing criteria. Will send in refill to requested pharmacy.

## 2021-07-23 ENCOUNTER — Encounter: Payer: Self-pay | Admitting: Interventional Cardiology

## 2021-07-23 ENCOUNTER — Encounter: Payer: Self-pay | Admitting: Internal Medicine

## 2021-07-23 DIAGNOSIS — E1165 Type 2 diabetes mellitus with hyperglycemia: Secondary | ICD-10-CM

## 2021-07-23 MED ORDER — DAPAGLIFLOZIN PROPANEDIOL 5 MG PO TABS: ORAL_TABLET | ORAL | 2 refills | Status: DC

## 2021-07-23 NOTE — Telephone Encounter (Signed)
Rx sent in for farxiga.

## 2021-08-07 ENCOUNTER — Other Ambulatory Visit: Payer: Self-pay | Admitting: Internal Medicine

## 2021-08-07 DIAGNOSIS — E1165 Type 2 diabetes mellitus with hyperglycemia: Secondary | ICD-10-CM

## 2021-08-09 ENCOUNTER — Other Ambulatory Visit: Payer: Self-pay

## 2021-08-09 ENCOUNTER — Telehealth: Payer: Self-pay | Admitting: *Deleted

## 2021-08-09 ENCOUNTER — Encounter: Payer: Self-pay | Admitting: Cardiology

## 2021-08-09 ENCOUNTER — Telehealth (INDEPENDENT_AMBULATORY_CARE_PROVIDER_SITE_OTHER): Payer: 59 | Admitting: Cardiology

## 2021-08-09 VITALS — Ht 72.0 in | Wt 240.0 lb

## 2021-08-09 DIAGNOSIS — G4733 Obstructive sleep apnea (adult) (pediatric): Secondary | ICD-10-CM | POA: Diagnosis not present

## 2021-08-09 DIAGNOSIS — E669 Obesity, unspecified: Secondary | ICD-10-CM | POA: Diagnosis not present

## 2021-08-09 DIAGNOSIS — I10 Essential (primary) hypertension: Secondary | ICD-10-CM

## 2021-08-09 NOTE — Telephone Encounter (Signed)
Order placed to [facility] via community message. 

## 2021-08-09 NOTE — Telephone Encounter (Signed)
-----   Message from Antonieta Iba, RN sent at 08/09/2021  8:32 AM EST ----- Per Dr. Radford Pax: order him a new ResMed auto CPAP from 4-18cm H2O with heated humidity, mask of choice and supplies.  Followup after he gets his new device Thanks!

## 2021-08-09 NOTE — Patient Instructions (Signed)
Medication Instructions:  Your physician recommends that you continue on your current medications as directed. Please refer to the Current Medication list given to you today.  *If you need a refill on your cardiac medications before your next appointment, please call your pharmacy*  Follow-Up: At [facility], you and your health needs are our priority.  As part of our continuing mission to provide you with exceptional heart care, we have created designated Provider Care Teams.  These Care Teams include your primary Cardiologist (physician) and Advanced Practice Providers (APPs -  Physician Assistants and Nurse Practitioners) who all work together to provide you with the care you need, when you need it.  Follow up with Dr. Radford Pax after getting your new device.

## 2021-08-09 NOTE — Progress Notes (Signed)
Virtual Visit via Video Note   This visit type was conducted due to national recommendations for restrictions regarding the COVID-19 Pandemic (e.g. social distancing) in an effort to limit this patient's exposure and mitigate transmission in our community.  Due to his co-morbid illnesses, this patient is at least at moderate risk for complications without adequate follow up.  This format is felt to be most appropriate for this patient at this time.  The patient did not have access to video technology/had technical difficulties with video requiring transitioning to audio format only (telephone).  All issues noted in this document were discussed and addressed.  Please refer to the patient's chart for his  consent to telehealth for [facility].   Evaluation Performed:  Follow-up visit  Date:  08/09/2021   ID:  Dalton [name], DOB September 02, 1965, MRN [id number]  Patient Location:  Home  Provider location:   [place]  PCP:  Einar Pheasant, MD  Cardiologist:  Sinclair Grooms, MD  SLeep Medicine:  Fransico Him, MD Electrophysiologist:  None   Chief Complaint:  OSA  History of Present Illness:    Dalton [name] is a 56 y.o. male who presents via audio/video conferencing for a telehealth visit today.    Dalton [name] is a 56 y.o. male with a hx of bicuspid aortic valve with moderate aortic stenosis, CAD and mild obstructive sleep apnea with last HST 02/2019 showing an AHI of 12/hr and is on CPAP.   He is doing well with his CPAP device and thinks that he has gotten used to it.  He tolerates the mask and feels the pressure is adequate.   He denies any significant mouth or nasal dryness or nasal congestion.  He does not think that he snores.   He continues to have issues with sleep that he thinks is related to being in the service and is working with his Psychiatrist. He says that he dose not get into deep sleep likely related to his psychotropic meds.  His device is old and would  like to get a new device that will connect to his phone.   The patient does not have symptoms concerning for COVID-19 infection (fever, chills, cough, or new shortness of breath).   Prior CV studies:   The following studies were reviewed today:  PAP compliance download  Past Medical History:  Diagnosis Date   Aortic valve stenosis    a. Bicuspid AV - 2D Echo 06/2014 - mod AS, mild AI, mildly dilated aortic root.   Arthritis    Neck   CAD (coronary artery disease)    Carotid artery disease ([place])    a. Mild by duplex 05/2015 - 1-39% BICA. Repeat due 05/2017.   Depression    Diabetes mellitus without complication ([place])    Dilated aortic root ([place])    a. By echo 06/2014.   Dyspnea    with exercion   Dysrhythmia    Atrial Fibrilation   Environmental allergies    Headache    MIgraines- rare   Heart murmur    Hypercholesterolemia    Hypertension    Obesity (BMI 30-39.9) 04/26/2018   PTSD (post-traumatic stress disorder)    Sleep apnea    CPAP   SVT (supraventricular tachycardia) ([place])    a. h/o SVT - Ablation done 2013.   Past Surgical History:  Procedure Laterality Date   AORTIC VALVE REPLACEMENT N/A 10/04/2020   Procedure: AORTIC VALVE REPLACEMENT (AVR);  Surgeon: Gaye Pollack, MD;  Location: [facility] OR;  Service: Open Heart Surgery;  Laterality: N/A;   CARDIAC ELECTROPHYSIOLOGY STUDY AND ABLATION  08/21/2011   COLONOSCOPY WITH PROPOFOL N/A 06/26/2017   Procedure: COLONOSCOPY WITH PROPOFOL;  Surgeon: Manya Silvas, MD;  Location: [facility] ENDOSCOPY;  Service: Endoscopy;  Laterality: N/A;   ESOPHAGOGASTRODUODENOSCOPY (EGD) WITH PROPOFOL N/A 06/26/2017   Procedure: ESOPHAGOGASTRODUODENOSCOPY (EGD) WITH PROPOFOL;  Surgeon: Manya Silvas, MD;  Location: [facility] ENDOSCOPY;  Service: Endoscopy;  Laterality: N/A;   MAZE N/A 10/04/2020   Procedure: MAZE;  Surgeon: Gaye Pollack, MD;  Location: [place];  Service: Open Heart Surgery;  Laterality: N/A;   RIGHT/LEFT HEART CATH AND  CORONARY ANGIOGRAPHY N/A 11/16/2019   Procedure: RIGHT/LEFT HEART CATH AND CORONARY ANGIOGRAPHY;  Surgeon: [name], MD;  Location: [place] CV LAB;  Service: Cardiovascular;  Laterality: N/A;   SEPTOPLASTY N/A 03/22/2015   Procedure: SEPTOPLASTY  WITH RIGHT INFERIOR TURBINATE REDUCTION;  Surgeon: Margaretha Sheffield, MD;  Location: [place];  Service: ENT;  Laterality: N/A;   SUPRAVENTRICULAR TACHYCARDIA ABLATION N/A 08/21/2011   Procedure: SUPRAVENTRICULAR TACHYCARDIA ABLATION;  Surgeon: Evans Lance, MD;  Location: [facility] CATH LAB;  Service: Cardiovascular;  Laterality: N/A;   surgery for non descending testicle     TEE WITHOUT CARDIOVERSION N/A 11/16/2019   Procedure: TRANSESOPHAGEAL ECHOCARDIOGRAM (TEE);  Surgeon: Geralynn Rile, MD;  Location: [place];  Service: Cardiovascular;  Laterality: N/A;   TEE WITHOUT CARDIOVERSION N/A 10/04/2020   Procedure: TRANSESOPHAGEAL ECHOCARDIOGRAM (TEE);  Surgeon: Gaye Pollack, MD;  Location: [place];  Service: Open Heart Surgery;  Laterality: N/A;   TONSILLECTOMY       Current Meds  Medication Sig   acetaminophen (TYLENOL) 500 MG tablet Take 1,000 mg by mouth every 6 (six) hours as needed for moderate pain or headache.   apixaban (ELIQUIS) 5 MG TABS tablet TAKE 1 TABLET(5 MG) BY MOUTH TWICE DAILY   atorvastatin (LIPITOR) 40 MG tablet Take 1 tablet (40 mg total) by mouth daily.   buPROPion (WELLBUTRIN XL) 150 MG 24 hr tablet Take 1 tablet (150 mg total) by mouth daily.   cetirizine (ZYRTEC) 10 MG tablet Take 10 mg by mouth 2 (two) times daily.    citalopram (CELEXA) 40 MG tablet Take 1 tablet (40 mg total) by mouth every morning.   CONTOUR NEXT TEST test strip USE AS DIRECTED TO CHECK BLOOD SUGARS TWICE DAILY   dapagliflozin propanediol (FARXIGA) 5 MG TABS tablet TAKE 1 TABLET(5 MG) BY MOUTH DAILY BEFORE AND BREAKFAST   digoxin (LANOXIN) 0.125 MG tablet Take 1 tablet (0.125 mg total) by mouth daily.   fluticasone (FLONASE) 50 MCG/ACT  nasal spray Place 2 sprays into both nostrils daily as needed for allergies or rhinitis.   Lancets (ONETOUCH DELICA PLUS [id number]) MISC USE TWICE DAILY AS DIRECTED   metFORMIN (GLUCOPHAGE) 500 MG tablet TAKE 1 TABLET(500 MG) BY MOUTH TWICE DAILY   metoprolol succinate (TOPROL XL) 25 MG 24 hr tablet Take 1 tablet (25 mg total) by mouth daily.   NON FORMULARY as directed. CPAP   OZEMPIC, 1 MG/DOSE, 4 MG/3ML SOPN INJECT 1 MG INTO THE SKIN ONCE A WEEK   QUEtiapine (SEROQUEL) 50 MG tablet Take 1 tablet (50 mg total) by mouth at bedtime.   sacubitril-valsartan (ENTRESTO) 24-26 MG Take 1 tablet by mouth 2 (two) times daily.     Allergies:   Patient has no known allergies.   Social History   Tobacco Use   Smoking status: Former  Types: Cigarettes    Quit date: 11/04/1988    Years since quitting: 32.7   Smokeless tobacco: Former  Scientific laboratory technician Use: Never used  Substance Use Topics   Alcohol use: Yes    Comment: occasionally   Drug use: Not Currently    Comment: many years ago in highschool     Family Hx: The patient's family history includes Aortic stenosis in his maternal grandfather; Arthritis in his mother; Breast cancer in his maternal grandmother; Cancer in his father and maternal grandfather; Diabetes in his mother; Heart attack in his father; Heart disease in his mother; Kidney disease in his father.  ROS:   Please see the history of present illness.     All other systems reviewed and are negative.   Labs/Other Tests and Data Reviewed:    Recent Labs: 10/05/2020: Magnesium 2.5 01/24/2021: ALT 21; BUN 13; Creatinine, Ser 1.12; Hemoglobin 15.4; Platelets 163.0; Potassium 4.6; Sodium 137   Recent Lipid Panel Lab Results  Component Value Date/Time   CHOL 137 04/26/2020 11:33 AM   TRIG 136.0 04/26/2020 11:33 AM   HDL 35.90 (L) 04/26/2020 11:33 AM   CHOLHDL 4 04/26/2020 11:33 AM   LDLCALC 74 04/26/2020 11:33 AM   LDLDIRECT 228.0 05/14/2018 09:51 AM    Wt Readings  from Last 3 Encounters:  08/09/21 240 lb (108.9 kg)  06/24/21 243 lb 12.8 oz (110.6 kg)  06/24/21 240 lb (108.9 kg)     Objective:    Vital Signs:  Ht 6' (1.829 m)    Wt 240 lb (108.9 kg)    BMI 32.55 kg/m    Well nourished, well developed male in no acute distress. Well appearing, alert and conversant, regular work of breathing,  good skin color  Eyes- anicteric mouth- oral mucosa is pink  neuro- grossly intact skin- no apparent rash or lesions or cyanosis   ASSESSMENT & PLAN:    1.  OSA - The patient is tolerating PAP therapy well without any problems. The PAP download performed by his DME was personally reviewed and interpreted by me today and showed an AHI of 2.9/hr on auto PAP cm H2O with 83% compliance in using more than 4 hours nightly.  The patient has been using and benefiting from PAP use and will continue to benefit from therapy.  -I will order him a new ResMed auto CPAP from 4-18cm H2O with heated humidity, mask of choice and supplies   2.  HTN -BP is controlled at home -continue prescription drug management with Entresto 24-26mg  BID and Toprol XL 25mg  daily with PRN refills  3.  Obesity -he has lost 35-40lbs and I congratulated him   COVID-19 Education: The signs and symptoms of COVID-19 were discussed with the patient and how to seek care for testing (follow up with PCP or arrange E-visit).  The importance of social distancing was discussed today.  Patient Risk:   After full review of this patient's clinical status, I feel that they are at least moderate risk at this time.  Time:   Today, I have spent 20 minutes on telemedicine discussing medical problems including OSA, Obesity and HTN and reviewing patient's chart including PAP compliance download and HST 02/2019.  Medication Adjustments/Labs and Tests Ordered: Current medicines are reviewed at length with the patient today.  Concerns regarding medicines are outlined above.  Tests Ordered: No orders of the  defined types were placed in this encounter.  Medication Changes: No orders of the defined types were placed  in this encounter.   Disposition:  Follow up in 1 year(s)  Signed, Fransico Him, MD  08/09/2021 8:25 AM    [facility]

## 2021-08-19 ENCOUNTER — Telehealth: Payer: 59 | Admitting: Psychiatry

## 2021-08-19 ENCOUNTER — Telehealth: Payer: Self-pay | Admitting: Pharmacist

## 2021-08-19 ENCOUNTER — Other Ambulatory Visit: Payer: Self-pay

## 2021-08-19 ENCOUNTER — Telehealth: Payer: Self-pay | Admitting: Internal Medicine

## 2021-08-19 ENCOUNTER — Other Ambulatory Visit: Payer: Self-pay | Admitting: Internal Medicine

## 2021-08-19 ENCOUNTER — Ambulatory Visit: Payer: 59 | Admitting: Pharmacist

## 2021-08-19 ENCOUNTER — Telehealth: Payer: Self-pay | Admitting: Psychiatry

## 2021-08-19 DIAGNOSIS — I4891 Unspecified atrial fibrillation: Secondary | ICD-10-CM

## 2021-08-19 DIAGNOSIS — E1165 Type 2 diabetes mellitus with hyperglycemia: Secondary | ICD-10-CM

## 2021-08-19 DIAGNOSIS — I10 Essential (primary) hypertension: Secondary | ICD-10-CM

## 2021-08-19 MED ORDER — DAPAGLIFLOZIN PROPANEDIOL 10 MG PO TABS: 10.0000 mg | ORAL_TABLET | Freq: Every day | ORAL | 1 refills | Status: DC

## 2021-08-19 MED ORDER — FLUTICASONE PROPIONATE 50 MCG/ACT NA SUSP: 2.0000 | Freq: Every day | NASAL | 1 refills | Status: DC | PRN

## 2021-08-19 NOTE — Telephone Encounter (Signed)
Patient reports that he has recently been off on Fridays. He wonders if he can have his April appointment with Dr. Nicki Reaper rescheduled to a Friday.   Routing to LPN. I also suggested to him that he schedule fasting labs the Friday before so that results would be available at the time of his appointment.

## 2021-08-19 NOTE — Patient Instructions (Signed)
[name],   Increase Farxiga to 10 mg daily. This is the dose that has the evidence in treatment of heart failure. Please continue to monitor your blood pressure at home.   Consider the Shingrix (shingles) and Tdap (tetanus, diptheria, and pertussis) vaccines when you see Dr. Nicki Reaper next.   Take care!  Catie Darnelle Maffucci, PharmD

## 2021-08-19 NOTE — Telephone Encounter (Signed)
Sent link for video visit through [place]. Patient did not sign in. Called the patient twice for appointment scheduled today. The patient did not answer the phone. Voice message was full.

## 2021-08-19 NOTE — Telephone Encounter (Signed)
Rx sent in for flonase with refill

## 2021-08-19 NOTE — Chronic Care Management (AMB) (Signed)
Chronic Care Management CCM Pharmacy Note  08/19/2021 Name:  Dalton [name] MRN:  [id number] DOB:  1966-02-10  Summary: - Tolerating regimen well. Was off Ozempic for 2 weeks due to back order issues, but plans to pick up from pharmacy  Recommendations/Changes made from today's visit: - Increase Farxiga to 10 mg daily for HF evidence-based dosing - Discussed vaccine recommendations  Subjective: Dalton [name] is an 56 y.o. year old male who is a primary patient of Einar Pheasant, MD.  The CCM team was consulted for assistance with disease management and care coordination needs.    Engaged with patient by telephone for follow up visit for pharmacy case management and/or care coordination services.   Objective:  Medications Reviewed Today     Reviewed by De Hollingshead, RPH-CPP (Pharmacist) on 08/19/21 at 55  Med List Status: <None>   Medication Order Taking? Sig Documenting Provider Last Dose Status Informant  acetaminophen (TYLENOL) 500 MG tablet [id number] Yes Take 1,000 mg by mouth every 6 (six) hours as needed for moderate pain or headache. [provider] Taking Active   apixaban (ELIQUIS) 5 MG TABS tablet [id number] Yes TAKE 1 TABLET(5 MG) BY MOUTH TWICE DAILY [name], MD Taking Active   atorvastatin (LIPITOR) 40 MG tablet [id number] Yes Take 1 tablet (40 mg total) by mouth daily. Einar Pheasant, MD Taking Active   buPROPion (WELLBUTRIN XL) 150 MG 24 hr tablet [id number] Yes Take 1 tablet (150 mg total) by mouth daily. [name], MD Taking Active   cetirizine (ZYRTEC) 10 MG tablet [id number] Yes Take 10 mg by mouth 2 (two) times daily.  [provider] Taking Active Self           Med Note [name], [name]   Tue Aug 10, 2018  8:35 PM)    citalopram (CELEXA) 40 MG tablet [id number] Yes Take 1 tablet (40 mg total) by mouth every morning. [name], MD Taking Active   CONTOUR NEXT TEST test strip [id number]  USE AS DIRECTED TO CHECK BLOOD  SUGARS TWICE DAILY Einar Pheasant, MD  Active   dapagliflozin propanediol (FARXIGA) 5 MG TABS tablet [id number] Yes TAKE 1 TABLET(5 MG) BY MOUTH DAILY BEFORE AND [place] Norman, MD Taking Active   digoxin (LANOXIN) 0.125 MG tablet [id number] Yes Take 1 tablet (0.125 mg total) by mouth daily. [name], MD Taking Active   fluticasone [facility]) 50 MCG/ACT nasal spray [id number] Yes Place 2 sprays into both nostrils daily as needed for allergies or rhinitis. [provider] Taking Active   Lancets [facility] PLUS [id number]) [place] [id number] Yes USE TWICE DAILY AS DIRECTED Einar Pheasant, MD Taking Active Self  metFORMIN (GLUCOPHAGE) 500 MG tablet [id number] Yes TAKE 1 TABLET(500 MG) BY MOUTH TWICE DAILY Einar Pheasant, MD Taking Active   metoprolol succinate (TOPROL XL) 25 MG 24 hr tablet [id number] Yes Take 1 tablet (25 mg total) by mouth daily. [name], MD Taking Active   NON FORMULARY [id number]  as directed. CPAP [provider]  Active Self  OZEMPIC, 1 MG/DOSE, 4 MG/3ML SOPN [id number] Yes INJECT 1 MG INTO THE SKIN ONCE A WEEK Einar Pheasant, MD Taking Active   QUEtiapine (SEROQUEL) 50 MG tablet [id number] Yes Take 1 tablet (50 mg total) by mouth at bedtime. [name], MD Taking Active   sacubitril-valsartan [facility] 24-26 [place] [id number] Yes Take 1 tablet by mouth 2 (two) times daily. [name], MD Taking Active  Pertinent Labs:   Lab Results  Component Value Date   HGBA1C 6.1 01/24/2021   Lab Results  Component Value Date   CHOL 137 04/26/2020   HDL 35.90 (L) 04/26/2020   LDLCALC 74 04/26/2020   LDLDIRECT 228.0 05/14/2018   TRIG 136.0 04/26/2020   CHOLHDL 4 04/26/2020   Lab Results  Component Value Date   CREATININE 1.12 01/24/2021   BUN 13 01/24/2021   NA 137 01/24/2021   K 4.6 01/24/2021   CL 99 01/24/2021   CO2 26 01/24/2021    SDOH:  (Social Determinants of Health) assessments and interventions performed:   SDOH Interventions    Flowsheet Row Most Recent Value  SDOH Interventions   Financial Strain Interventions Intervention Not Indicated       CCM Care Plan  Review of patient past medical history, allergies, medications, health status, including review of consultants reports, laboratory and other test data, was performed as part of comprehensive evaluation and provision of chronic care management services.   Care Plan : Medication Management  Updates made by De Hollingshead, RPH-CPP since 08/19/2021 12:00 AM     Problem: Diabetes, Heart Failure, Aortic Disease      Long-Range Goal: Disease Progression Prevention   Recent Progress: On track  Priority: High  Note:   Current Barriers:  Unable to achieve control of diabetes   Pharmacist Clinical Goal(s):  Over the next 90 days, patient will achieve control of diabetes as evidenced by improvement in A1c through collaboration with PharmD and provider.   Interventions: 1:1 collaboration with Einar Pheasant, MD regarding development and update of comprehensive plan of care as evidenced by provider attestation and co-signature Inter-disciplinary care team collaboration (see longitudinal plan of care) Comprehensive medication review performed; medication list updated in electronic medical record  Health Maintenance   Yearly diabetic eye exam: up to date Yearly diabetic foot exam: up to date Urine microalbumin: up to date Yearly influenza vaccination: due - declined Td/Tdap vaccination: due - discussed today Pneumonia vaccination: up to date COVID vaccinations: due Shingrix vaccinations: due - discussed today Colonoscopy: up to date  Diabetes: Controlled per last A1c; current treatment: Farxiga 5 mg daily, metformin 500 mg BID, Ozempic 1 mg weekly  - has been off for ~ 2 weeks due to the pharmacy not having in stock but he notes it is now ready for him to pick up.  Current glucose readings: fasting glucose: 90-110s; post  prandials 200s, but this has been since being off Ozempic Weight: 241 lbs (down from 255 lbs) Recommended to restart Ozempic and continue regimen. Increase Farxiga as below.   Heart Failure, reduced with concurrent Atrial Fibrillation, s/p Aortic Valve Replacement:  Appropriately managed; current treatment:;  ARNI/ACEi/ARB: Entresto 24/26 mg BID Beta blocker: metoprolol succinate 25 mg daily SGLT2: Farxiga 5 mg daily  Mineralocorticoid Receptor Antagonist: prior issue, possible rash, hypotension Diuretic: none needed Also on digoxin 0.125 mg daily for atrial fibrillation/HF Most recent ECHO with EF 40-45% Current home vitals: 110s/70s Current home weights:  236.4 lbs Confirms understanding of importance of weighing daily. Confirms understanding to contact cardiology/primary care team if weight gain >3 lbs in 1 day or >5 lbs in 1 week Discussed that HF dosing for [place] is 10 mg daily. Patient amenable to increase. Script sent to pharmacy. Continue to monitor BP at home.    Hyperlipidemia and ASCVD risk reduction: Uncontrolled, LDL not at goal <70 given diabetes and risk factors; current treatment: atorvastatin 40 mg daily Antiplatelet therapy: aspirin  81 mg daily Discussed goal LDL. Recommend maximization to 80 mg daily if LDL not at goal <70 with upcoming labs.   Depression/Anxiety: Moderately well managed. Current regimen: bupropion XL 150 mg daily, citalopram 40 mg daily, quetiapine 50 mg QPM, follows w/ LCSW and psychiatry.  Previously recommended to continue current regimen and collaboration with psychiatry at this time  Allergies: Controlled per patient report; cetirizine 10 mg BID, fluticasone intranasal PRN Requests prescription for fluticasone to be sent to pharmacy due to upcoming allergy season. Will collaborate w/ PCP Previously recommended to continue current regimen at this time  Patient Goals/Self-Care Activities Over the next days, patient will:  - take medications  as prescribed check glucose at least twice daily, document, and provide at future appointments      Plan: video visit in 3 months.  Catie Darnelle Maffucci, PharmD, [place], [place] Clinical Pharmacist [employer] at [employer] [phone]

## 2021-08-21 NOTE — Telephone Encounter (Signed)
LM to schedule fasting lab appt on 4/14. Please schedule if he returns call. Labs are ordered

## 2021-09-02 DIAGNOSIS — Z0271 Encounter for disability determination: Secondary | ICD-10-CM

## 2021-09-10 ENCOUNTER — Ambulatory Visit: Payer: Self-pay | Admitting: Pharmacist

## 2021-09-10 NOTE — Chronic Care Management (AMB) (Signed)
?  Chronic Care Management  ? ?Note ? ?09/10/2021 ?Name: BREANDAN PEOPLE MRN: [id number] DOB: [birth year] ? ? ? ?Closing pharmacy CCM case at this time. Patient has clinic contact information for future questions or concerns.  ? ?Catie Darnelle Maffucci, PharmD, [place], CPP ?Clinical Pharmacist ?Therapist, music at [employer] ?[phone] ? ?

## 2021-09-14 ENCOUNTER — Other Ambulatory Visit: Payer: Self-pay | Admitting: Psychiatry

## 2021-09-14 NOTE — Telephone Encounter (Signed)
Ordered refill of quetiapine per request. Please contact the patient to make follow up appointment. I will not be able to prescribe any more refills without evaluation. ?

## 2021-09-21 IMAGING — DX DG CHEST 1V PORT
1 series · 1 of 1 positions shown · non-contrast
Comparison: September 20, 2020

CLINICAL DATA: Hypoxia.  Status post aortic valve replacement

EXAM:
PORTABLE CHEST 1 VIEW

[chest]
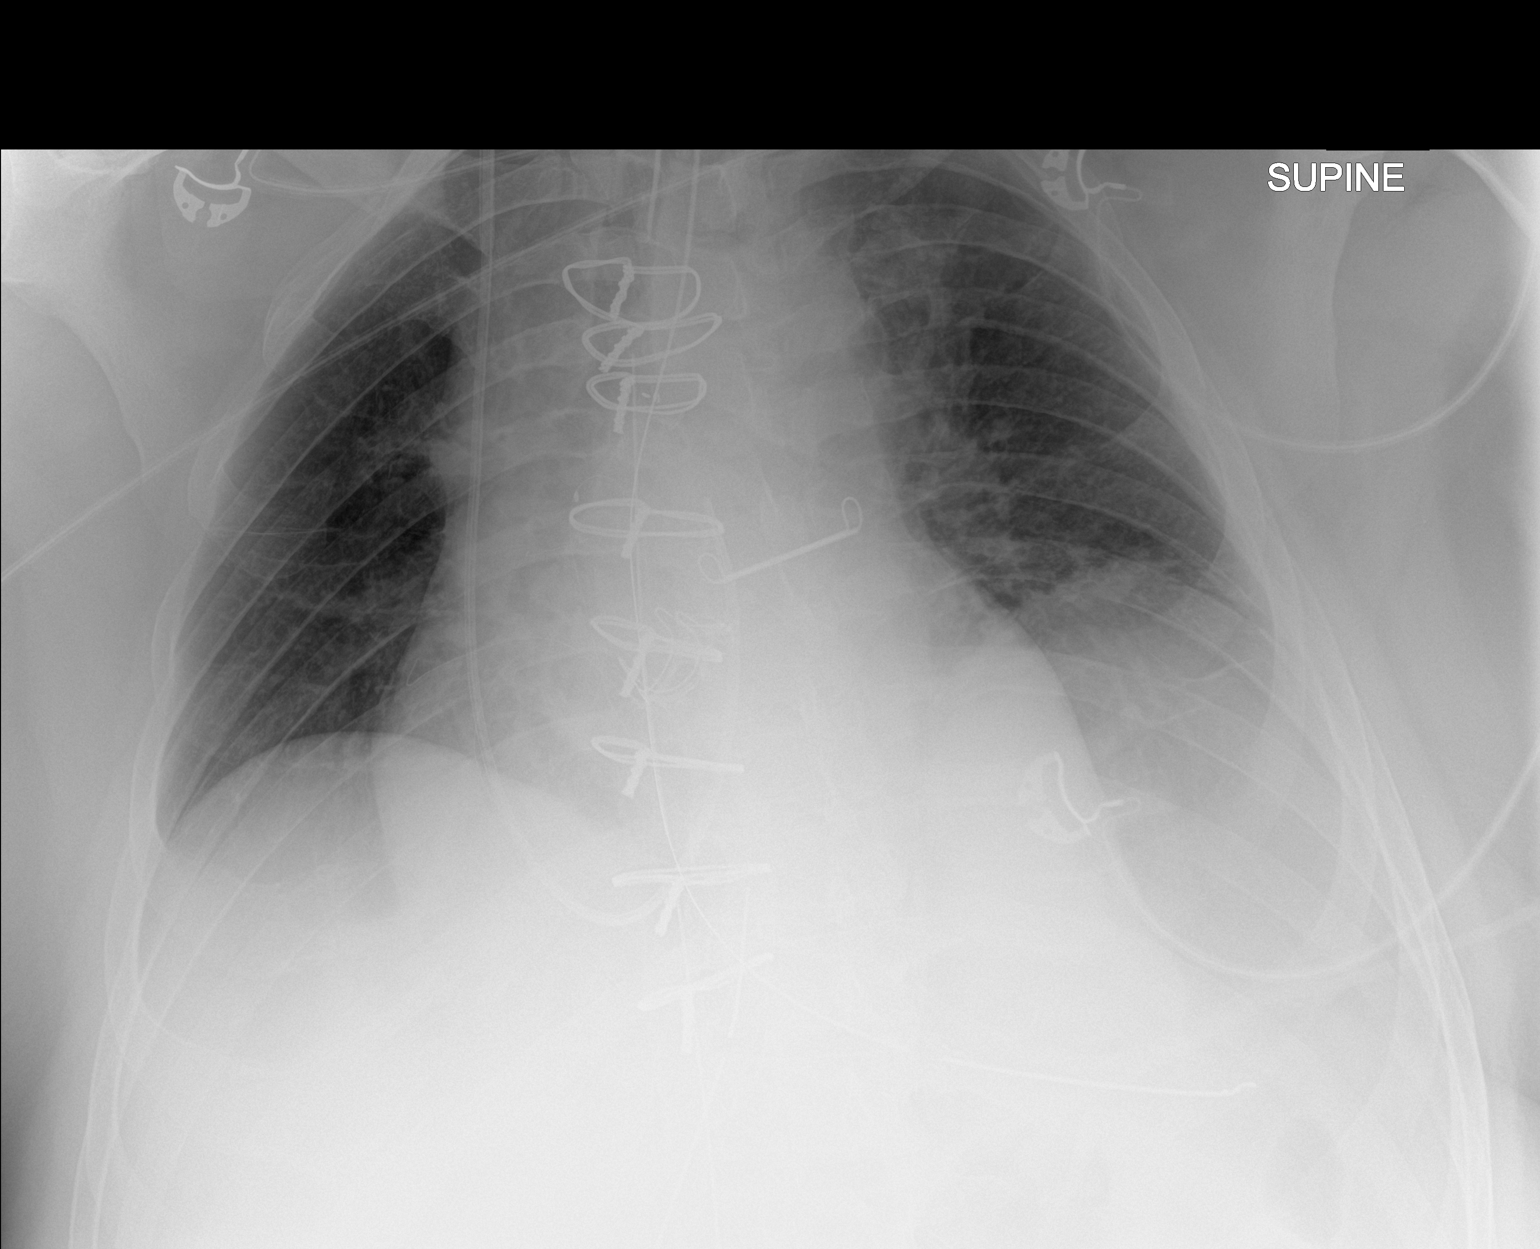

[1 of 1 positions shown; findings below may reference images not displayed]

FINDINGS: Endotracheal tube tip is 4.7 cm above the carina. Nasogastric tube
tip and side port are in the stomach. Swan-Ganz catheter tip is in
the main pulmonary outflow tract. There is a mediastinal drain. No
evident pneumothorax.

There is atelectatic change in the left base. Lungs elsewhere are
clear. Heart is upper normal in size with pulmonary vascularity
normal. Patient is status post aortic valve replacement. There is a
left atrial appendage clamp. No evident adenopathy. No bone lesions.
IMPRESSION: Tube and catheter positions as described without pneumothorax. Mild
left base atelectasis. Lungs otherwise clear. Postoperative changes
as noted.

## 2021-09-24 IMAGING — CR DG CHEST 2V
3 series · 3 of 3 positions shown · non-contrast
Comparison: 10/06/2020

CLINICAL DATA: Post aortic valve replacement.

EXAM:
CHEST - 2 VIEW

[chest pa (1 of 2)]
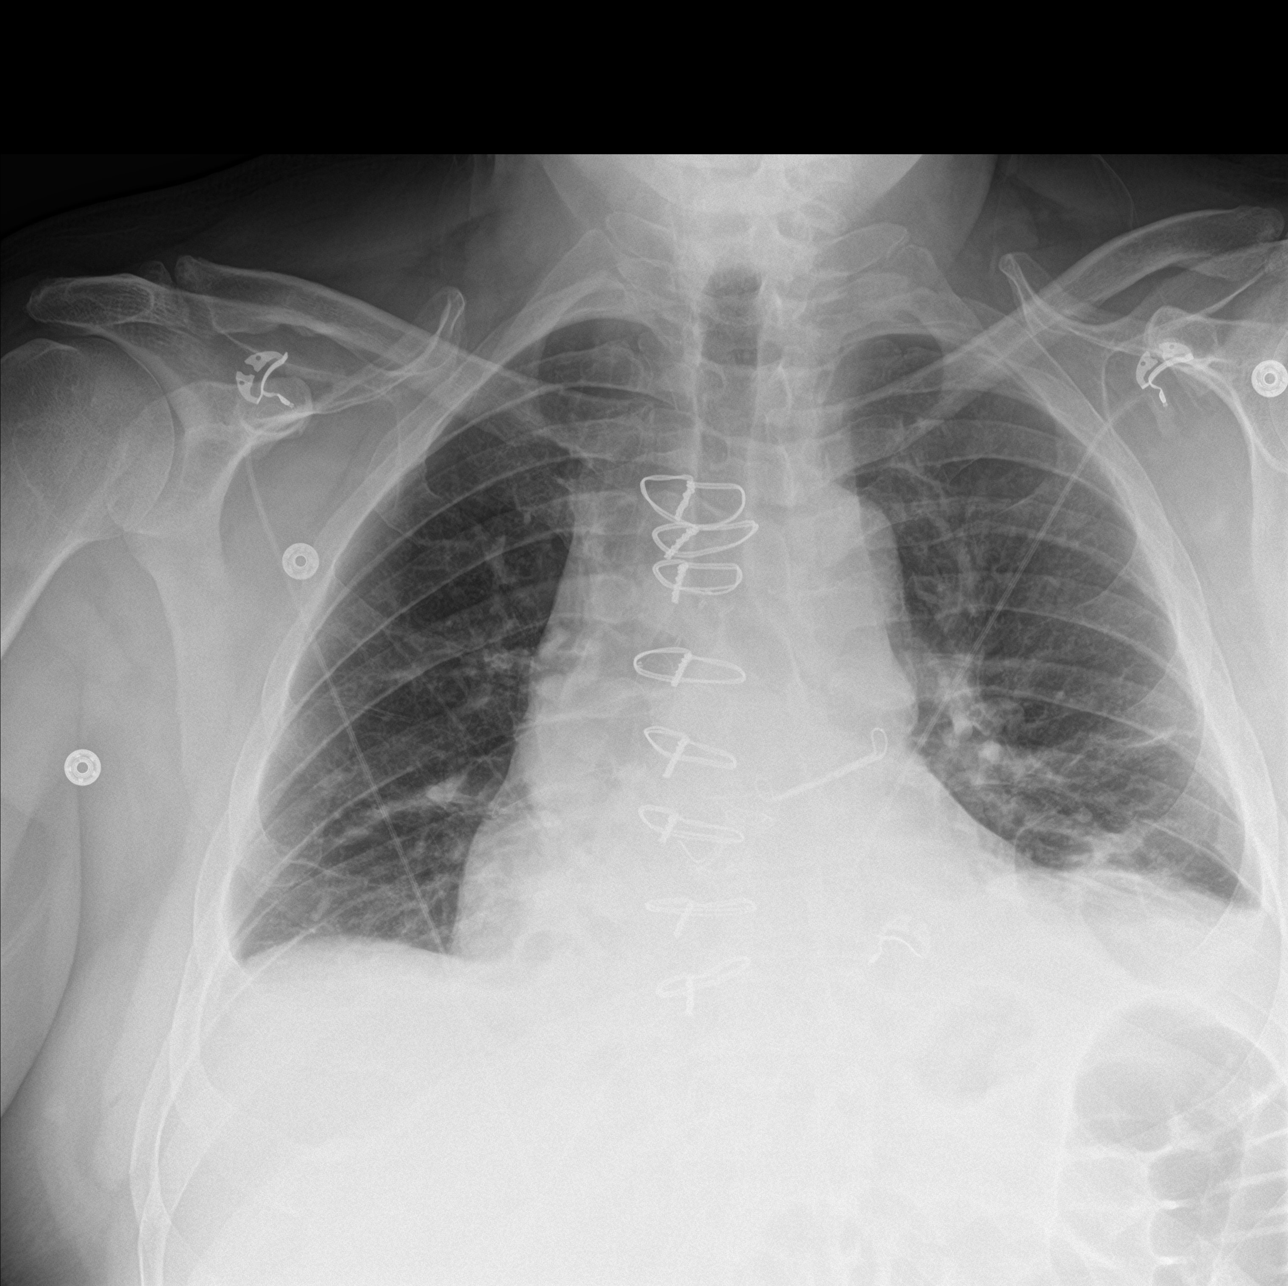

[chest lat]
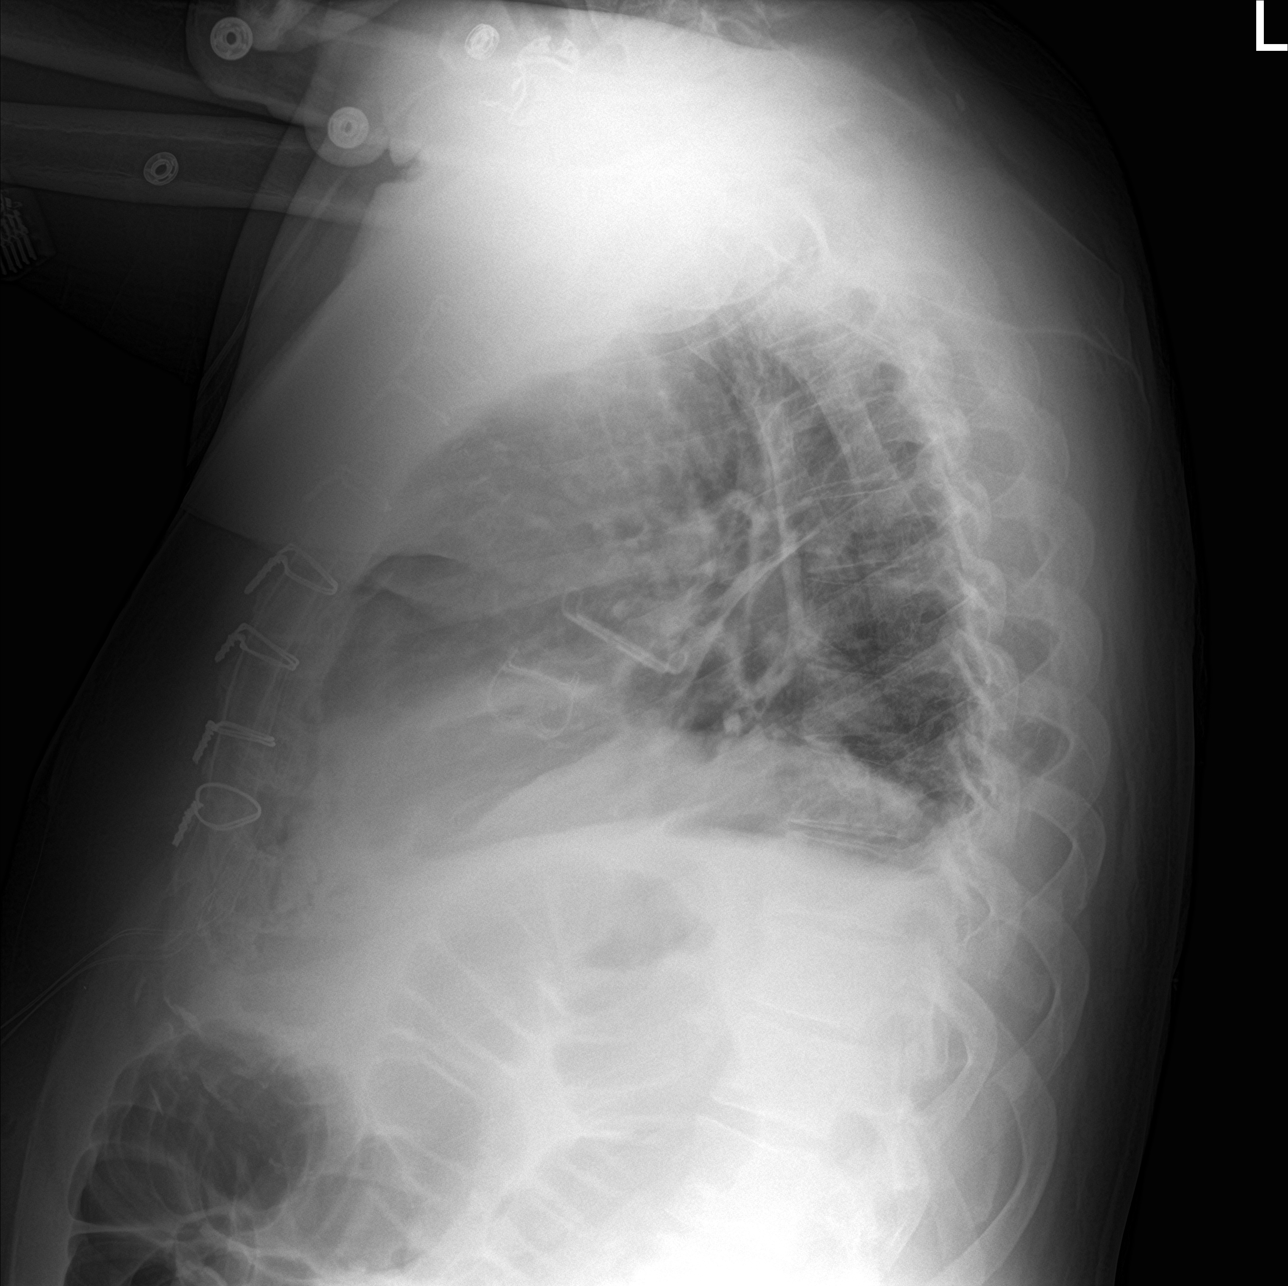

[chest pa (2 of 2)]
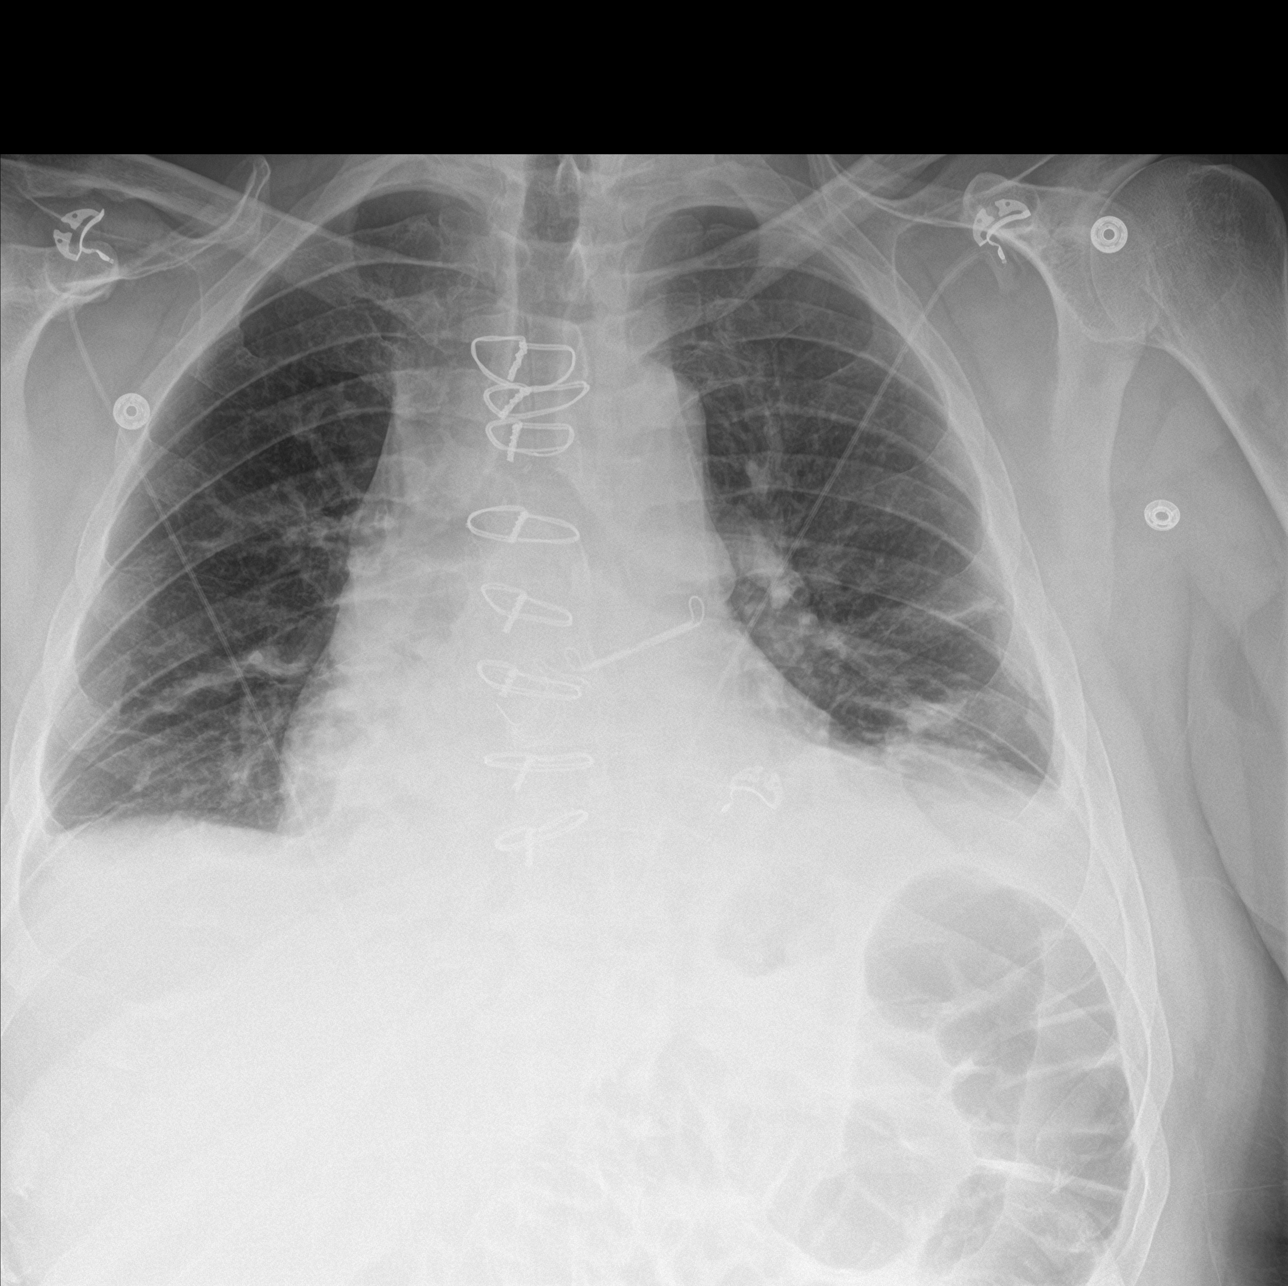

[3 of 3 positions shown; findings below may reference images not displayed]

FINDINGS: Sternotomy wires unchanged. Evidence of patient's prosthetic aortic
valve. Lungs are hypoinflated with subtle left basilar density
likely atelectasis. Cardiomediastinal silhouette and remainder of
the exam is unchanged.
IMPRESSION: Hypoinflation with minimal left basilar density likely atelectasis.

## 2021-09-25 IMAGING — DX DG CHEST 1V PORT
1 series · 1 of 1 positions shown · non-contrast
Comparison: 10/07/2020

CLINICAL DATA: Encounter for acute respiratory failure.

EXAM:
PORTABLE CHEST 1 VIEW

[chest ap]
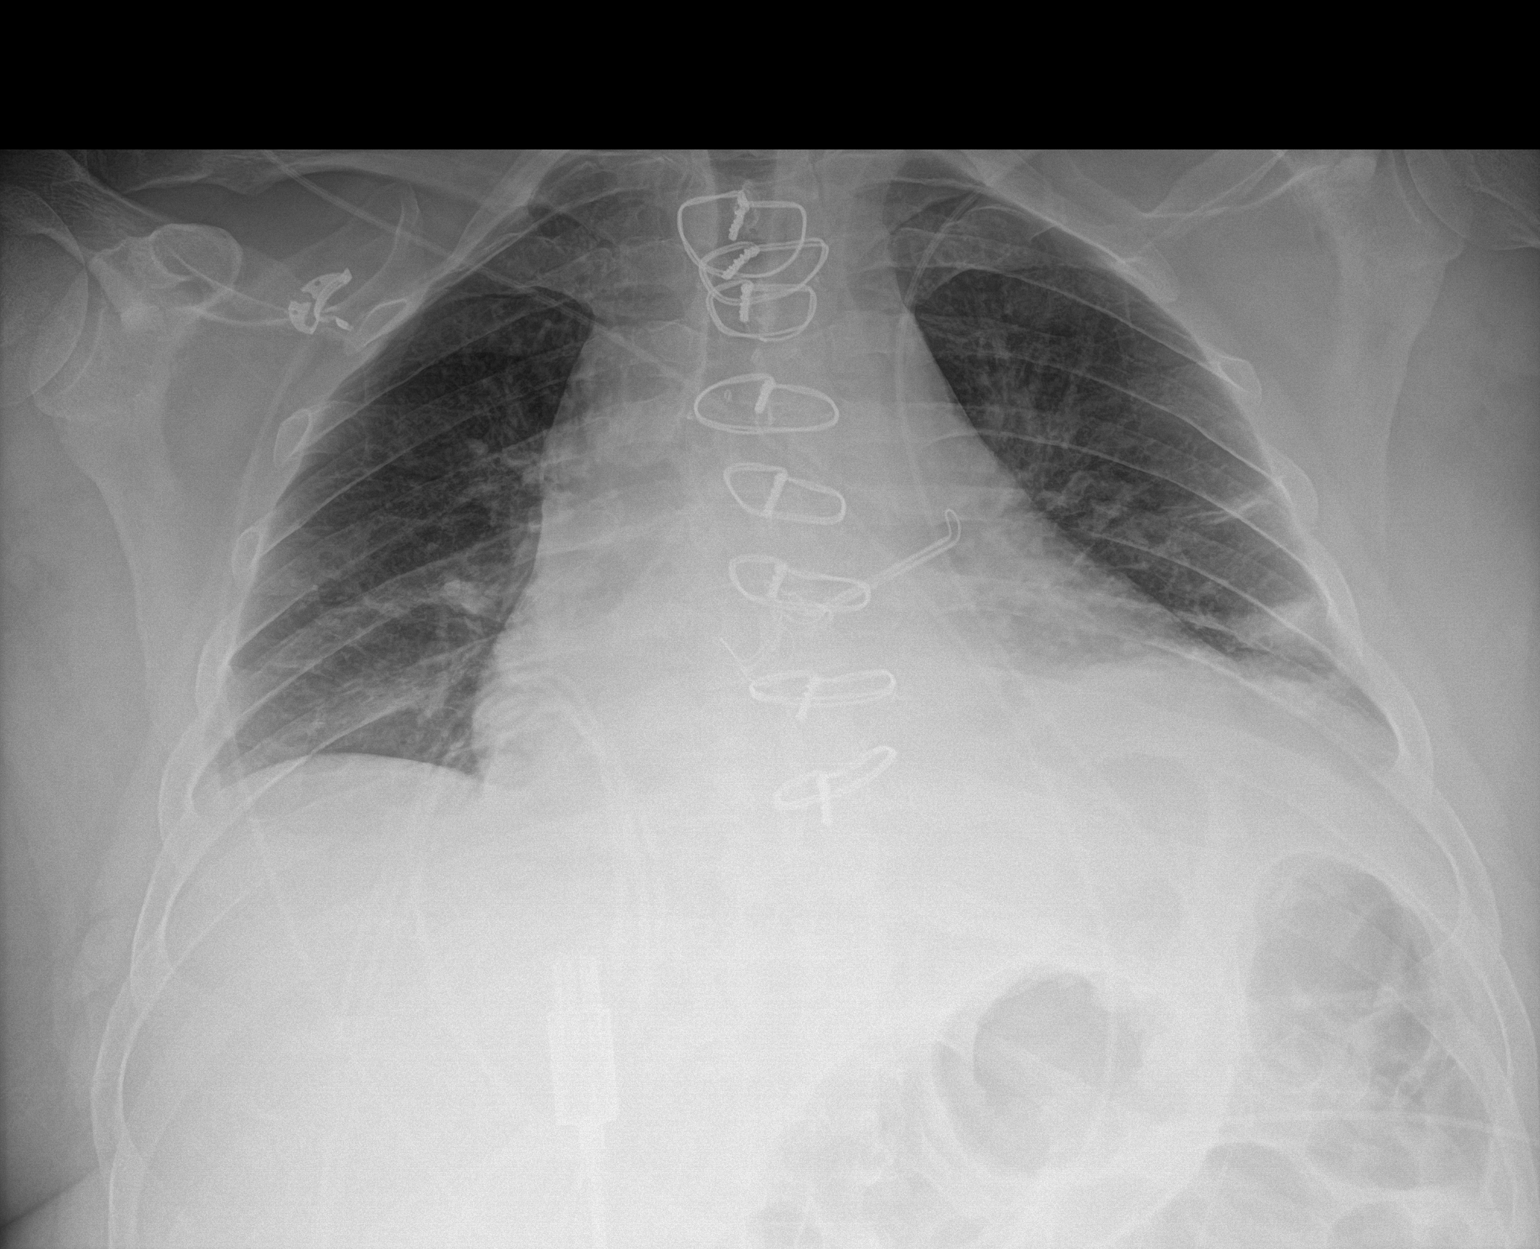

[1 of 1 positions shown; findings below may reference images not displayed]

FINDINGS: The heart size remains enlarged. The patient is status post prior
median sternotomy. There are unchanged airspace opacities at the
left lung base. There are small bilateral pleural effusions, similar
to prior study. No pneumothorax. The lung volumes are low.
IMPRESSION: No active disease.

## 2021-09-26 NOTE — Progress Notes (Signed)
Virtual Visit via Video Note ? ?I connected with Dalton [name] on 09/27/21 at  8:00 AM EDT by a video enabled telemedicine application and verified that I am speaking with the correct person using two identifiers. ? ?Location: ?Patient: outside ?Provider: office ?Persons participated in the visit- patient, provider  ?  ?I discussed the limitations of evaluation and management by telemedicine and the availability of in person appointments. The patient expressed understanding and agreed to proceed. ? ? ? ?  ?I discussed the assessment and treatment plan with the patient. The patient was provided an opportunity to ask questions and all were answered. The patient agreed with the plan and demonstrated an understanding of the instructions. ?  ?The patient was advised to call back or seek an in-person evaluation if the symptoms worsen or if the condition fails to improve as anticipated. ? ?I provided 20 minutes of non-face-to-face time during this encounter. ? ? ?[name], MD ? ? ? ?BH MD/PA/NP OP Progress Note ? ?09/27/2021 8:35 AM ?Dalton [name]  ?MRN:  [id number] ? ?Chief Complaint:  ?Chief Complaint  ?Patient presents with  ? Follow-up  ? Trauma  ? Depression  ? ?HPI:  ?This is a follow-up appointment for depression, PTSD and insomnia.  ?He states that his wife told him that he has been more moody and angry, sometimes for no reason.  He agrees that he has been feeling this way.  He feels stressed at work.  He states that there is an expectation of work due to the economy, although he does not think it is safe.  He thinks it is not justified.  He thinks that people can act in an unsafe way.  He talks about an example of being complained by one of his customers when he got late for 30 mins.  He is also concerned that how long he can stay at his job due to his physical condition.  He is also concerned that the company may let him go due to the economy.  He enjoyed going out with his family the other night.   Although his wife still has seizure at times, he has been doing good as long as he knows that she is doing okay.  He sleeps better since being on new CPAP machine.  He denies change in weight or appetite.  He denies feeling depressed.  He feels anxious at times.  He denies SI.  He occasionally has nightmares and flashback.  He denies hypervigilance.  He drinks 2 beers only occasionally.  He denies drug use. He denies drowsiness.  He thinks he will be good, stay on the current medication regimen.  ? ?Employment: full time at [employer] as a Administrator, local delivery, 6 AM-7 PM for 27 years ?Military: served in Corporate [employer] for 21 year, retired in 2005. He was in [place] after 911, no combat experience ?Support: wife ?Household: [name]/his wife ?Marital status: married with his wife of six years ?Number of children: 2 daughters, age 3, 53, grandchild, 3 in march,  ? ?Visit Diagnosis:  ?  ICD-10-CM   ?1. PTSD (post-traumatic stress disorder)  F43.10   ?  ?2. Mild episode of recurrent major depressive disorder (HCC)  F33.0   ?  ?3. Insomnia, unspecified type  G47.00   ?  ? ? ?Past Psychiatric History: Please see initial evaluation for full details. I have reviewed the history. No updates at this time.  ?  ? ?Past Medical History:  ?Past Medical History:  ?Diagnosis  Date  ? Aortic valve stenosis   ? a. Bicuspid AV - 2D Echo 06/2014 - mod AS, mild AI, mildly dilated aortic root.  ? Arthritis   ? Neck  ? CAD (coronary artery disease)   ? Carotid artery disease ([place])   ? a. Mild by duplex 05/2015 - 1-39% BICA. Repeat due 05/2017.  ? Depression   ? Diabetes mellitus without complication ([place])   ? Dilated aortic root ([place])   ? a. By echo 06/2014.  ? Dyspnea   ? with exercion  ? Dysrhythmia   ? Atrial Fibrilation  ? Environmental allergies   ? Headache   ? MIgraines- rare  ? Heart murmur   ? Hypercholesterolemia   ? Hypertension   ? Obesity (BMI 30-39.9) 04/26/2018  ? PTSD (post-traumatic stress disorder)   ? Sleep apnea   ? CPAP  ? SVT  (supraventricular tachycardia) ([place])   ? a. h/o SVT - Ablation done 2013.  ?  ?Past Surgical History:  ?Procedure Laterality Date  ? AORTIC VALVE REPLACEMENT N/A 10/04/2020  ? Procedure: AORTIC VALVE REPLACEMENT (AVR);  Surgeon: Gaye Pollack, MD;  Location: [place];  Service: Open Heart Surgery;  Laterality: N/A;  ? CARDIAC ELECTROPHYSIOLOGY STUDY AND ABLATION  08/21/2011  ? COLONOSCOPY WITH PROPOFOL N/A 06/26/2017  ? Procedure: COLONOSCOPY WITH PROPOFOL;  Surgeon: Manya Silvas, MD;  Location: [facility] ENDOSCOPY;  Service: Endoscopy;  Laterality: N/A;  ? ESOPHAGOGASTRODUODENOSCOPY (EGD) WITH PROPOFOL N/A 06/26/2017  ? Procedure: ESOPHAGOGASTRODUODENOSCOPY (EGD) WITH PROPOFOL;  Surgeon: Manya Silvas, MD;  Location: [facility] ENDOSCOPY;  Service: Endoscopy;  Laterality: N/A;  ? MAZE N/A 10/04/2020  ? Procedure: MAZE;  Surgeon: Gaye Pollack, MD;  Location: [place];  Service: Open Heart Surgery;  Laterality: N/A;  ? RIGHT/LEFT HEART CATH AND CORONARY ANGIOGRAPHY N/A 11/16/2019  ? Procedure: RIGHT/LEFT HEART CATH AND CORONARY ANGIOGRAPHY;  Surgeon: [name], MD;  Location: [place] CV LAB;  Service: Cardiovascular;  Laterality: N/A;  ? SEPTOPLASTY N/A 03/22/2015  ? Procedure: SEPTOPLASTY  WITH RIGHT INFERIOR TURBINATE REDUCTION;  Surgeon: Margaretha Sheffield, MD;  Location: [place];  Service: ENT;  Laterality: N/A;  ? SUPRAVENTRICULAR TACHYCARDIA ABLATION N/A 08/21/2011  ? Procedure: SUPRAVENTRICULAR TACHYCARDIA ABLATION;  Surgeon: Evans Lance, MD;  Location: [facility] CATH LAB;  Service: Cardiovascular;  Laterality: N/A;  ? surgery for non descending testicle    ? TEE WITHOUT CARDIOVERSION N/A 11/16/2019  ? Procedure: TRANSESOPHAGEAL ECHOCARDIOGRAM (TEE);  Surgeon: Geralynn Rile, MD;  Location: [place];  Service: Cardiovascular;  Laterality: N/A;  ? TEE WITHOUT CARDIOVERSION N/A 10/04/2020  ? Procedure: TRANSESOPHAGEAL ECHOCARDIOGRAM (TEE);  Surgeon: Gaye Pollack, MD;  Location: [place];  Service:  Open Heart Surgery;  Laterality: N/A;  ? TONSILLECTOMY    ? ? ?Family Psychiatric History: Please see initial evaluation for full details. I have reviewed the history. No updates at this time.  ?  ? ?Family History:  ?Family History  ?Problem Relation Age of Onset  ? Diabetes Mother   ? Heart disease Mother   ? Arthritis Mother   ? Heart attack Father   ? Kidney disease Father   ? Cancer Father   ? Breast cancer Maternal Grandmother   ? Cancer Maternal Grandfather   ? Aortic stenosis Maternal Grandfather   ? ? ?Social History:  ?Social History  ? ?Socioeconomic History  ? Marital status: Married  ?  Spouse name: Not on file  ? Number of children: 2  ? Years  of education: Not on file  ? Highest education level: Not on file  ?Occupational History  ? Occupation: truck Geophysicist/field seismologist  ?  Employer: [employer]  ?Tobacco Use  ? Smoking status: Former  ?  Types: Cigarettes  ?  Quit date: 11/04/1988  ?  Years since quitting: 32.9  ? Smokeless tobacco: Former  ?Vaping Use  ? Vaping Use: Never used  ?Substance and Sexual Activity  ? Alcohol use: Yes  ?  Comment: occasionally  ? Drug use: Not Currently  ?  Comment: many years ago in highschool  ? Sexual activity: Not Currently  ?Other Topics Concern  ? Not on file  ?Social History Narrative  ? Very rare exercise  ? ?Social Determinants of Health  ? ?Financial Resource Strain: Low Risk   ? Difficulty of Paying Living Expenses: Not hard at all  ?Food Insecurity: Not on file  ?Transportation Needs: Not on file  ?Physical Activity: Not on file  ?Stress: Not on file  ?Social Connections: Not on file  ? ? ?Allergies: No Known Allergies ? ?Metabolic Disorder Labs: ?Lab Results  ?Component Value Date  ? HGBA1C 6.1 01/24/2021  ? MPG 128.37 09/20/2020  ? ?No results found for: PROLACTIN ?Lab Results  ?Component Value Date  ? CHOL 137 04/26/2020  ? TRIG 136.0 04/26/2020  ? HDL 35.90 (L) 04/26/2020  ? CHOLHDL 4 04/26/2020  ? VLDL 27.2 04/26/2020  ? [place] 74 04/26/2020  ? [place] 84 12/26/2019   ? ?Lab Results  ?Component Value Date  ? TSH 1.22 06/09/2019  ? TSH 1.56 05/14/2018  ? ? ?Therapeutic Level Labs: ?No results found for: LITHIUM ?No results found for: VALPROATE ?No components found for:  CBM

## 2021-09-26 NOTE — Telephone Encounter (Signed)
pt was made an appt for tomorrow at 8 ?

## 2021-09-27 ENCOUNTER — Other Ambulatory Visit: Payer: Self-pay

## 2021-09-27 ENCOUNTER — Telehealth (INDEPENDENT_AMBULATORY_CARE_PROVIDER_SITE_OTHER): Payer: 59 | Admitting: Psychiatry

## 2021-09-27 ENCOUNTER — Encounter: Payer: Self-pay | Admitting: Psychiatry

## 2021-09-27 DIAGNOSIS — F330 Major depressive disorder, recurrent, mild: Secondary | ICD-10-CM | POA: Diagnosis not present

## 2021-09-27 DIAGNOSIS — F4310 Post-traumatic stress disorder, unspecified: Secondary | ICD-10-CM

## 2021-09-27 DIAGNOSIS — G4700 Insomnia, unspecified: Secondary | ICD-10-CM

## 2021-09-27 MED ORDER — BUPROPION HCL ER (XL) 150 MG PO TB24: 150.0000 mg | ORAL_TABLET | Freq: Every day | ORAL | 1 refills | Status: DC

## 2021-09-27 NOTE — Patient Instructions (Signed)
Continue Citalopram 40 mg daily ?Continue bupropion 150 mg daily ?Continue quetiapine 50 mg at night  ?Next appointment: 5/15 at 8 AM, in person ? ?The next visit will be in person visit. Please arrive 15 mins before the scheduled time.  ? ?[place]  ?Address: [address], [place], [place] [address]   ?

## 2021-10-11 ENCOUNTER — Other Ambulatory Visit: Payer: Self-pay | Admitting: Internal Medicine

## 2021-10-11 DIAGNOSIS — E1165 Type 2 diabetes mellitus with hyperglycemia: Secondary | ICD-10-CM

## 2021-10-18 IMAGING — DX DG CHEST 2V
2 series · 2 of 2 positions shown · non-contrast
Comparison: 10/15/2020

CLINICAL DATA: Follow-up aortic valve replacement

EXAM:
CHEST - 2 VIEW

[dg chest 2 view (1 of 2)]
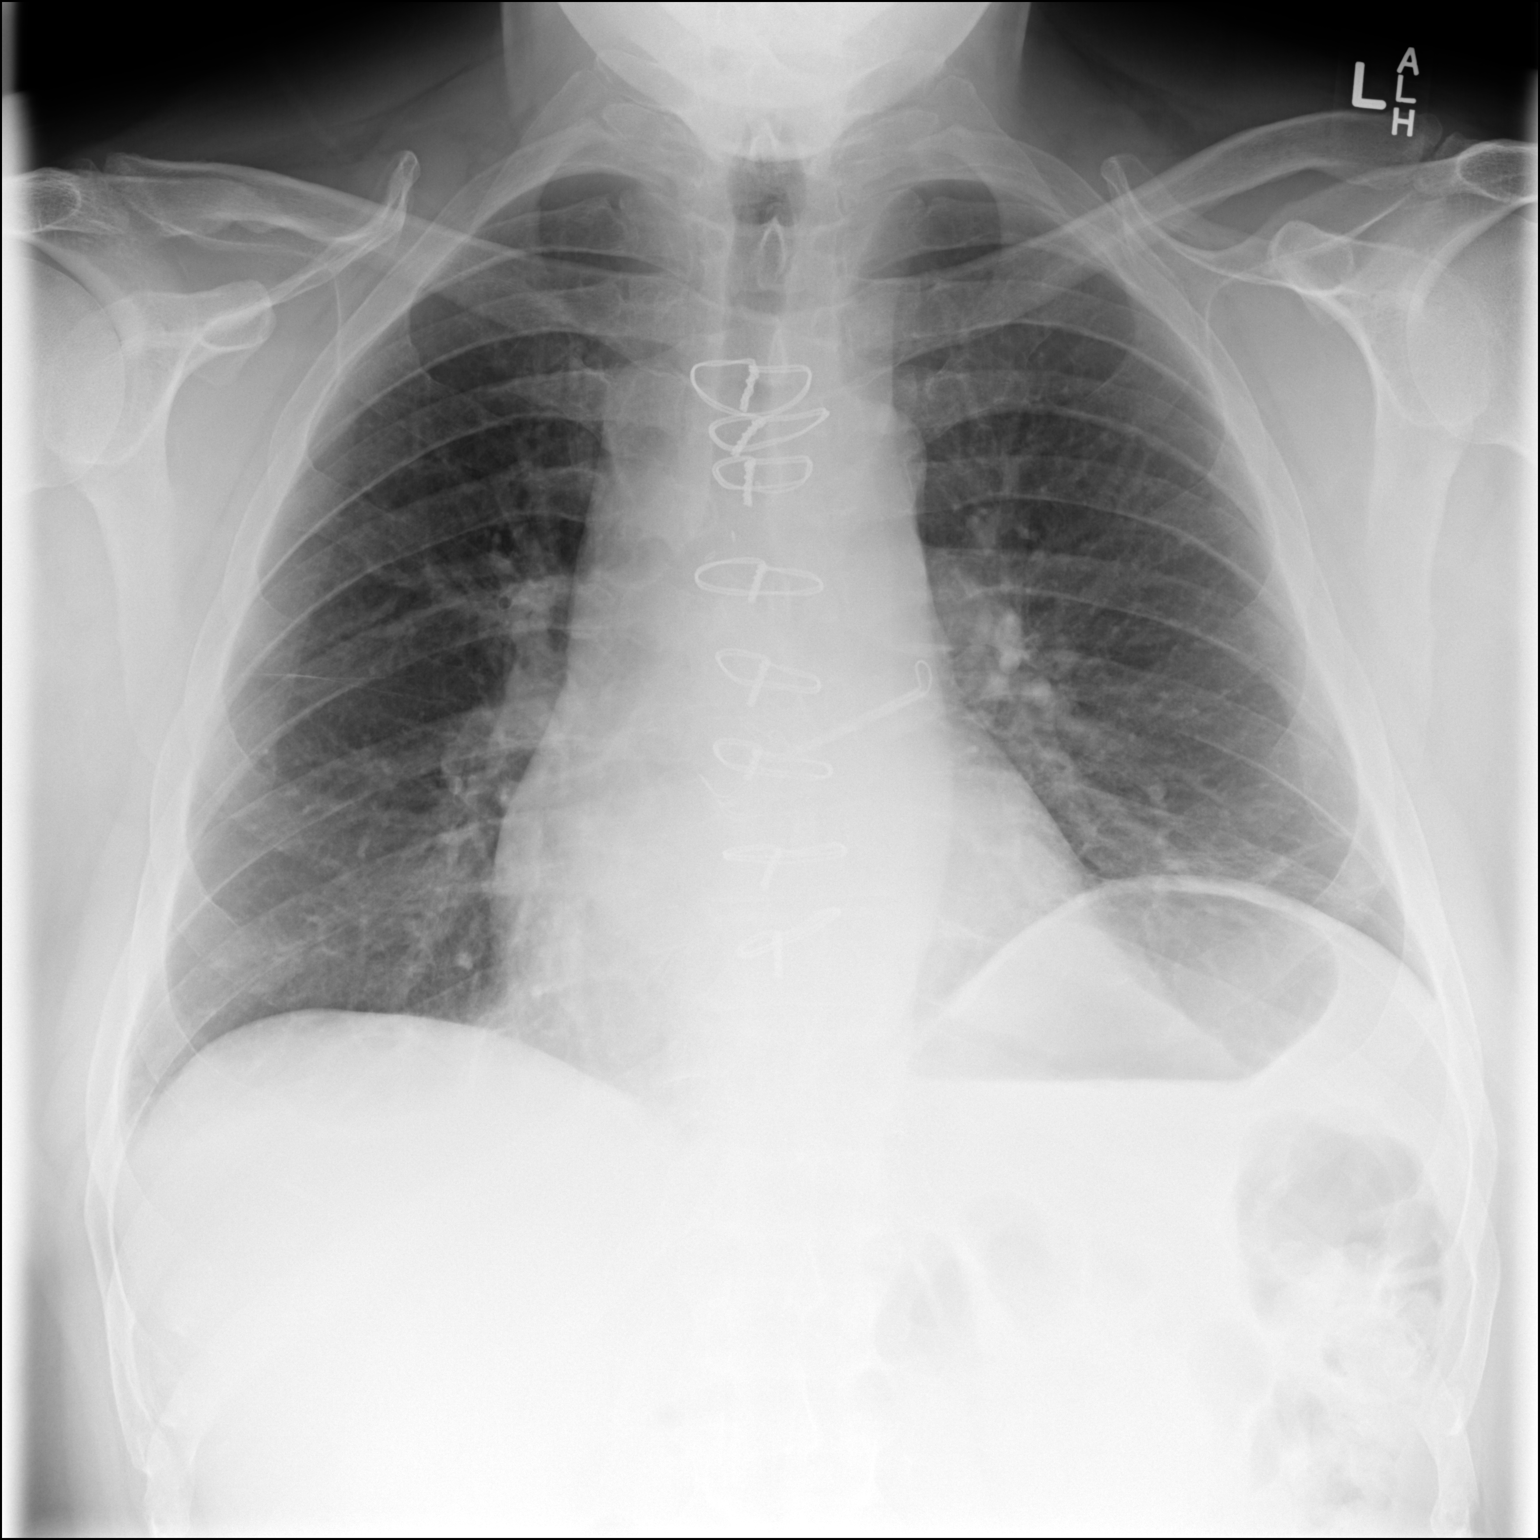

[dg chest 2 view (2 of 2)]
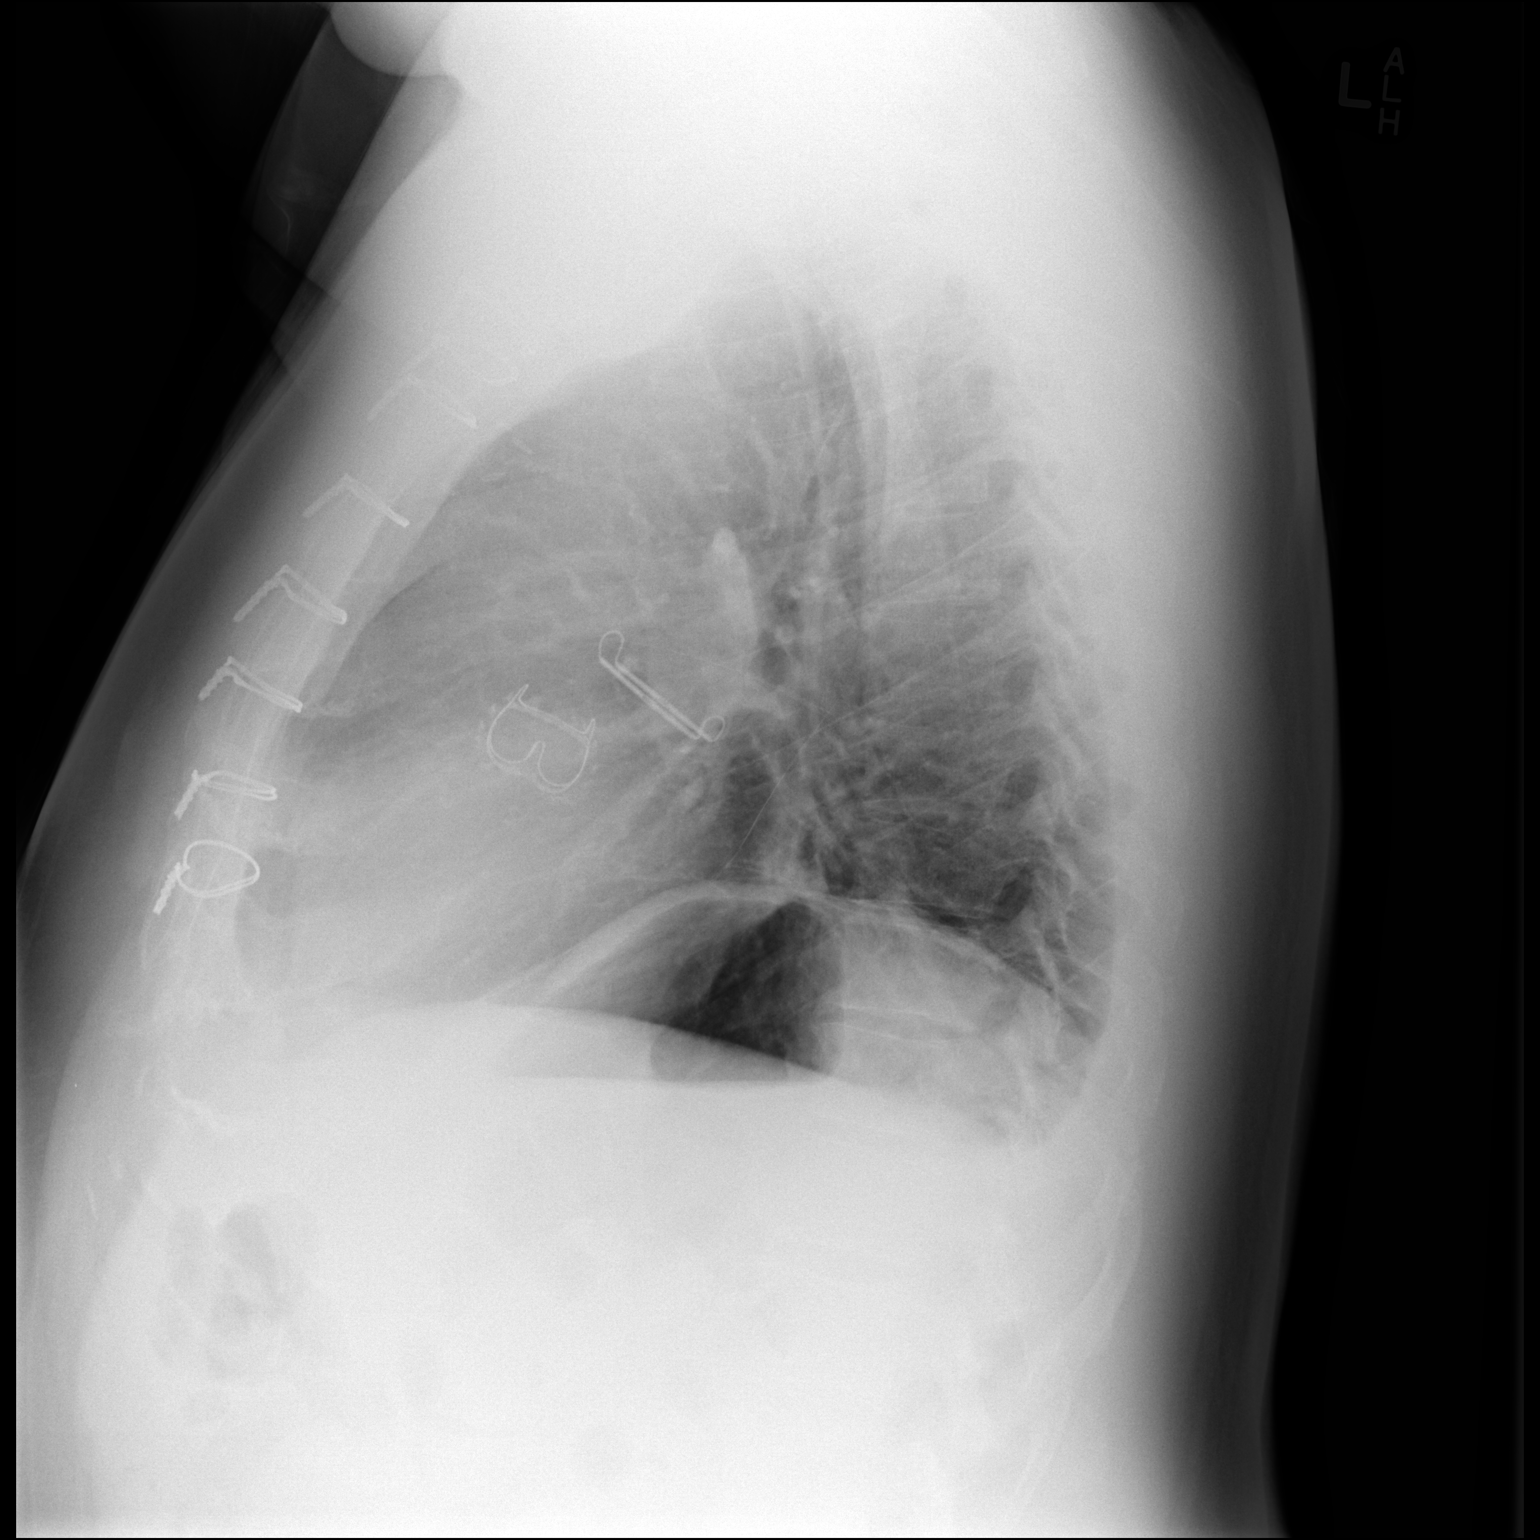

[2 of 2 positions shown; findings below may reference images not displayed]

FINDINGS: Previous median sternotomy, aortic valve replacement and atrial
clip. Minimal effusions in the posterior costophrenic angles are
getting smaller. Mild atelectasis at the left lower lobe persists.
The remainder of the chest is clear.
IMPRESSION: Minimal pleural effusions, getting smaller. Persistent mild
atelectasis at the left base.

## 2021-10-24 ENCOUNTER — Encounter: Payer: 59 | Admitting: Internal Medicine

## 2021-10-25 ENCOUNTER — Ambulatory Visit (INDEPENDENT_AMBULATORY_CARE_PROVIDER_SITE_OTHER): Payer: 59 | Admitting: Internal Medicine

## 2021-10-25 ENCOUNTER — Encounter: Payer: Self-pay | Admitting: Internal Medicine

## 2021-10-25 VITALS — BP 98/72 | HR 90 | Temp 98.1°F | Ht 73.0 in | Wt 247.4 lb

## 2021-10-25 DIAGNOSIS — E1165 Type 2 diabetes mellitus with hyperglycemia: Secondary | ICD-10-CM

## 2021-10-25 DIAGNOSIS — E1169 Type 2 diabetes mellitus with other specified complication: Secondary | ICD-10-CM

## 2021-10-25 DIAGNOSIS — Z125 Encounter for screening for malignant neoplasm of prostate: Secondary | ICD-10-CM

## 2021-10-25 DIAGNOSIS — I10 Essential (primary) hypertension: Secondary | ICD-10-CM | POA: Diagnosis not present

## 2021-10-25 DIAGNOSIS — Z0000 Encounter for general adult medical examination without abnormal findings: Secondary | ICD-10-CM | POA: Diagnosis not present

## 2021-10-25 DIAGNOSIS — K766 Portal hypertension: Secondary | ICD-10-CM

## 2021-10-25 DIAGNOSIS — I5042 Chronic combined systolic (congestive) and diastolic (congestive) heart failure: Secondary | ICD-10-CM

## 2021-10-25 DIAGNOSIS — I4891 Unspecified atrial fibrillation: Secondary | ICD-10-CM

## 2021-10-25 DIAGNOSIS — E041 Nontoxic single thyroid nodule: Secondary | ICD-10-CM

## 2021-10-25 DIAGNOSIS — K3189 Other diseases of stomach and duodenum: Secondary | ICD-10-CM

## 2021-10-25 DIAGNOSIS — E785 Hyperlipidemia, unspecified: Secondary | ICD-10-CM

## 2021-10-25 DIAGNOSIS — E1159 Type 2 diabetes mellitus with other circulatory complications: Secondary | ICD-10-CM

## 2021-10-25 DIAGNOSIS — F4310 Post-traumatic stress disorder, unspecified: Secondary | ICD-10-CM

## 2021-10-25 DIAGNOSIS — Z952 Presence of prosthetic heart valve: Secondary | ICD-10-CM

## 2021-10-25 DIAGNOSIS — G4733 Obstructive sleep apnea (adult) (pediatric): Secondary | ICD-10-CM

## 2021-10-25 DIAGNOSIS — E669 Obesity, unspecified: Secondary | ICD-10-CM

## 2021-10-25 DIAGNOSIS — I152 Hypertension secondary to endocrine disorders: Secondary | ICD-10-CM

## 2021-10-25 LAB — MICROALBUMIN / CREATININE URINE RATIO
Creatinine,U: 102.6 mg/dL
Microalb Creat Ratio: 0.8 mg/g (ref 0.0–30.0)
Microalb, Ur: 0.8 mg/dL (ref 0.0–1.9)

## 2021-10-25 LAB — HEPATIC FUNCTION PANEL
ALT: 22 U/L (ref 0–53)
AST: 14 U/L (ref 0–37)
Albumin: 4.7 g/dL (ref 3.5–5.2)
Alkaline Phosphatase: 91 U/L (ref 39–117)
Bilirubin, Direct: 0.2 mg/dL (ref 0.0–0.3)
Total Bilirubin: 1.2 mg/dL (ref 0.2–1.2)
Total Protein: 6.8 g/dL (ref 6.0–8.3)

## 2021-10-25 LAB — BASIC METABOLIC PANEL
BUN: 10 mg/dL (ref 6–23)
CO2: 27 mEq/L (ref 19–32)
Calcium: 9.4 mg/dL (ref 8.4–10.5)
Chloride: 102 mEq/L (ref 96–112)
Creatinine, Ser: 1.03 mg/dL (ref 0.40–1.50)
GFR: 81.44 mL/min (ref >60.00)
Glucose, Bld: 85 mg/dL (ref 70–99)
Potassium: 4.4 mEq/L (ref 3.5–5.1)
Sodium: 139 mEq/L (ref 135–145)

## 2021-10-25 LAB — LIPID PANEL
Cholesterol: 133 mg/dL (ref 0–200)
HDL: 33.9 mg/dL — ABNORMAL LOW (ref >39.00)
LDL Cholesterol: 74 mg/dL (ref 0–99)
NonHDL: 98.98
Total CHOL/HDL Ratio: 4
Triglycerides: 126 mg/dL (ref 0.0–149.0)
VLDL: 25.2 mg/dL (ref 0.0–40.0)

## 2021-10-25 LAB — HEMOGLOBIN A1C: Hgb A1c MFr Bld: 6.2 % (ref 4.6–6.5)

## 2021-10-25 LAB — TSH: TSH: 0.97 u[IU]/mL (ref 0.35–5.50)

## 2021-10-25 NOTE — Progress Notes (Signed)
Patient ID: Dalton [name], male   DOB: 1966-02-20, 56 y.o.   MRN: [id number] ? ? ?Subjective:  ? ? Patient ID: Dalton [name], male    DOB: 1966-05-21, 56 y.o.   MRN: [id number] ? ?This visit occurred during the SARS-CoV-2 public health emergency.  Safety protocols were in place, including screening questions prior to the visit, additional usage of staff PPE, and extensive cleaning of exam room while observing appropriate contact time as indicated for disinfecting solutions.  ? ?Patient here for his physical exam.  ? ?Chief Complaint  ?Patient presents with  ? Annual Exam  ? .  ? ?HPI ?States he is doing relatively well.  Increased stress.  Stress with his wife's medical issues and work.  Sees Dr Modesta Messing - f/u PTSD.  Has f/u 11/15/21.  Tries to stay active.  No chest pain or sob reported.  No cough or congestion.  No acid reflux reported.  No abdominal pain.  States sugars averaging - 90s-120s. Sees Dr Radford Pax - f/u OSA. The PAP download performed by his DME was reviewed and showed an AHI of 2.9/hr on auto PAP cm H2O with 83% compliance in using more than 4 hours nightly.  The patient has been using and benefiting from PAP use and will continue to benefit from therapy. ? ? ?Past Medical History:  ?Diagnosis Date  ? Aortic valve stenosis   ? a. Bicuspid AV - 2D Echo 06/2014 - mod AS, mild AI, mildly dilated aortic root.  ? Arthritis   ? Neck  ? CAD (coronary artery disease)   ? Carotid artery disease ([place])   ? a. Mild by duplex 05/2015 - 1-39% BICA. Repeat due 05/2017.  ? Depression   ? Diabetes mellitus without complication ([place])   ? Dilated aortic root ([place])   ? a. By echo 06/2014.  ? Dyspnea   ? with exercion  ? Dysrhythmia   ? Atrial Fibrilation  ? Environmental allergies   ? Headache   ? MIgraines- rare  ? Heart murmur   ? Hypercholesterolemia   ? Hypertension   ? Obesity (BMI 30-39.9) 04/26/2018  ? PTSD (post-traumatic stress disorder)   ? Sleep apnea   ? CPAP  ? SVT (supraventricular tachycardia) ([place])   ? a.  h/o SVT - Ablation done 2013.  ? ?Past Surgical History:  ?Procedure Laterality Date  ? AORTIC VALVE REPLACEMENT N/A 10/04/2020  ? Procedure: AORTIC VALVE REPLACEMENT (AVR);  Surgeon: Gaye Pollack, MD;  Location: [place];  Service: Open Heart Surgery;  Laterality: N/A;  ? CARDIAC ELECTROPHYSIOLOGY STUDY AND ABLATION  08/21/2011  ? COLONOSCOPY WITH PROPOFOL N/A 06/26/2017  ? Procedure: COLONOSCOPY WITH PROPOFOL;  Surgeon: Manya Silvas, MD;  Location: [facility] ENDOSCOPY;  Service: Endoscopy;  Laterality: N/A;  ? ESOPHAGOGASTRODUODENOSCOPY (EGD) WITH PROPOFOL N/A 06/26/2017  ? Procedure: ESOPHAGOGASTRODUODENOSCOPY (EGD) WITH PROPOFOL;  Surgeon: Manya Silvas, MD;  Location: [facility] ENDOSCOPY;  Service: Endoscopy;  Laterality: N/A;  ? MAZE N/A 10/04/2020  ? Procedure: MAZE;  Surgeon: Gaye Pollack, MD;  Location: [place];  Service: Open Heart Surgery;  Laterality: N/A;  ? RIGHT/LEFT HEART CATH AND CORONARY ANGIOGRAPHY N/A 11/16/2019  ? Procedure: RIGHT/LEFT HEART CATH AND CORONARY ANGIOGRAPHY;  Surgeon: [name], MD;  Location: [place] CV LAB;  Service: Cardiovascular;  Laterality: N/A;  ? SEPTOPLASTY N/A 03/22/2015  ? Procedure: SEPTOPLASTY  WITH RIGHT INFERIOR TURBINATE REDUCTION;  Surgeon: Margaretha Sheffield, MD;  Location: [facility];  Service: ENT;  Laterality: N/A;  ?  SUPRAVENTRICULAR TACHYCARDIA ABLATION N/A 08/21/2011  ? Procedure: SUPRAVENTRICULAR TACHYCARDIA ABLATION;  Surgeon: Evans Lance, MD;  Location: [facility] CATH LAB;  Service: Cardiovascular;  Laterality: N/A;  ? surgery for non descending testicle    ? TEE WITHOUT CARDIOVERSION N/A 11/16/2019  ? Procedure: TRANSESOPHAGEAL ECHOCARDIOGRAM (TEE);  Surgeon: Geralynn Rile, MD;  Location: [place];  Service: Cardiovascular;  Laterality: N/A;  ? TEE WITHOUT CARDIOVERSION N/A 10/04/2020  ? Procedure: TRANSESOPHAGEAL ECHOCARDIOGRAM (TEE);  Surgeon: Gaye Pollack, MD;  Location: [place];  Service: Open Heart Surgery;  Laterality: N/A;  ?  TONSILLECTOMY    ? ?Family History  ?Problem Relation Age of Onset  ? Diabetes Mother   ? Heart disease Mother   ? Arthritis Mother   ? Heart attack Father   ? Kidney disease Father   ? Cancer Father   ? Breast cancer Maternal Grandmother   ? Cancer Maternal Grandfather   ? Aortic stenosis Maternal Grandfather   ? ?Social History  ? ?Socioeconomic History  ? Marital status: Married  ?  Spouse name: Not on file  ? Number of children: 2  ? Years of education: Not on file  ? Highest education level: Not on file  ?Occupational History  ? Occupation: truck Geophysicist/field seismologist  ?  Employer: [employer]  ?Tobacco Use  ? Smoking status: Former  ?  Types: Cigarettes  ?  Quit date: 11/04/1988  ?  Years since quitting: 33.0  ? Smokeless tobacco: Former  ?Vaping Use  ? Vaping Use: Never used  ?Substance and Sexual Activity  ? Alcohol use: Yes  ?  Comment: occasionally  ? Drug use: Not Currently  ?  Comment: many years ago in highschool  ? Sexual activity: Not Currently  ?Other Topics Concern  ? Not on file  ?Social History Narrative  ? Very rare exercise  ? ?Social Determinants of Health  ? ?Financial Resource Strain: Low Risk   ? Difficulty of Paying Living Expenses: Not hard at all  ?Food Insecurity: Not on file  ?Transportation Needs: Not on file  ?Physical Activity: Not on file  ?Stress: Not on file  ?Social Connections: Not on file  ? ? ? ?Review of Systems  ?Constitutional:  Negative for appetite change and unexpected weight change.  ?HENT:  Negative for congestion, sinus pressure and sore throat.   ?Eyes:  Negative for pain and visual disturbance.  ?Respiratory:  Negative for cough, chest tightness and shortness of breath.   ?Cardiovascular:  Negative for chest pain, palpitations and leg swelling.  ?Gastrointestinal:  Negative for abdominal pain, diarrhea, nausea and vomiting.  ?Genitourinary:  Negative for difficulty urinating and dysuria.  ?Musculoskeletal:  Negative for joint swelling and myalgias.  ?Skin:  Negative for color  change and rash.  ?Neurological:  Negative for dizziness, light-headedness and headaches.  ?Hematological:  Negative for adenopathy. Does not bruise/bleed easily.  ?Psychiatric/Behavioral:  Negative for agitation and dysphoric mood.   ?     Increased stress as outlined.    ? ?   ?Objective:  ?  ? ?BP 98/72 (BP Location: Left Arm, Patient Position: Sitting, Cuff Size: Large)   Pulse 90   Temp 98.1 ?F (36.7 ?C) (Oral)   Ht '6\' 1"'$  (1.854 m)   Wt 247 lb 6.4 oz (112.2 kg)   SpO2 98%   BMI 32.64 kg/m?  ?Wt Readings from Last 3 Encounters:  ?10/25/21 247 lb 6.4 oz (112.2 kg)  ?08/09/21 240 lb (108.9 kg)  ?06/24/21 243 lb 12.8 oz (  110.6 kg)  ? ? ?Physical Exam ?Constitutional:   ?   General: He is not in acute distress. ?   Appearance: Normal appearance. He is well-developed.  ?HENT:  ?   Head: Normocephalic and atraumatic.  ?   Right Ear: External ear normal.  ?   Left Ear: External ear normal.  ?Eyes:  ?   General: No scleral icterus.    ?   Right eye: No discharge.     ?   Left eye: No discharge.  ?   Conjunctiva/sclera: Conjunctivae normal.  ?Neck:  ?   Thyroid: No thyromegaly.  ?Cardiovascular:  ?   Rate and Rhythm: Normal rate and regular rhythm.  ?Pulmonary:  ?   Effort: No respiratory distress.  ?   Breath sounds: Normal breath sounds. No wheezing.  ?Abdominal:  ?   General: Bowel sounds are normal.  ?   Palpations: Abdomen is soft.  ?   Tenderness: There is no abdominal tenderness.  ?Musculoskeletal:     ?   General: No swelling or tenderness.  ?   Cervical back: Neck supple. No tenderness.  ?Lymphadenopathy:  ?   Cervical: No cervical adenopathy.  ?Skin: ?   Findings: No erythema or rash.  ?Neurological:  ?   Mental Status: He is alert and oriented to person, place, and time.  ?Psychiatric:     ?   Mood and Affect: Mood normal.     ?   Behavior: Behavior normal.  ? ? ? ?Outpatient Encounter Medications as of 10/25/2021  ?Medication Sig  ? acetaminophen (TYLENOL) 500 MG tablet Take 1,000 mg by mouth every 6  (six) hours as needed for moderate pain or headache.  ? apixaban (ELIQUIS) 5 MG TABS tablet TAKE 1 TABLET(5 MG) BY MOUTH TWICE DAILY  ? atorvastatin (LIPITOR) 40 MG tablet Take 1 tablet (40 mg total) by mouth daily.  ? b

## 2021-11-03 ENCOUNTER — Telehealth: Payer: Self-pay | Admitting: Internal Medicine

## 2021-11-03 ENCOUNTER — Encounter: Payer: Self-pay | Admitting: Internal Medicine

## 2021-11-03 NOTE — Assessment & Plan Note (Signed)
Low cholesterol diet and exercise.  On lipitor.  Follow lipid panel and liver function tests.   

## 2021-11-03 NOTE — Assessment & Plan Note (Signed)
Found on EGD 06/2017.  No upper symptoms.  Fatty liver on ultrasound.   ?

## 2021-11-03 NOTE — Assessment & Plan Note (Signed)
Blood pressure doing well.  Continue entresto and metoprolol.  Follow pressures.  Follow metabolic panel.  

## 2021-11-03 NOTE — Assessment & Plan Note (Signed)
Followed by psychiatry 

## 2021-11-03 NOTE — Assessment & Plan Note (Signed)
Noted on CT.  Is s/p thyroid biopsy.  Confirm no f/u warranted.  

## 2021-11-03 NOTE — Assessment & Plan Note (Signed)
Sugars as outlined.  Doing well.  Ozempic.  Follow met b and a1c.  ?

## 2021-11-03 NOTE — Assessment & Plan Note (Signed)
On eliquis.  Followed by cardiology.  Stable.  ?

## 2021-11-03 NOTE — Telephone Encounter (Signed)
Please schedule him to come in Friday for non fasting lab - to have psa drawn.  Please notify him of appt date and time.   ?

## 2021-11-03 NOTE — Assessment & Plan Note (Signed)
The PAP download performed by his DME was reviewed and showed an AHI of 2.9/hr on auto PAP cm H2O with 83% compliance in using more than 4 hours nightly.  The patient has been using and benefiting from PAP use and will continue to benefit from therapy. ?

## 2021-11-03 NOTE — Assessment & Plan Note (Signed)
No evidence of volume overload.  Continue entresto and metoprolol.  Follow.  

## 2021-11-03 NOTE — Assessment & Plan Note (Signed)
Continue diet, exercise and weight loss.  Follow.  

## 2021-11-03 NOTE — Telephone Encounter (Signed)
My chart message sent regarding psa check.  ?

## 2021-11-03 NOTE — Assessment & Plan Note (Signed)
Followed by cardiology/CTS.  

## 2021-11-03 NOTE — Assessment & Plan Note (Signed)
Physical today 10/25/21.  Colonoscopy 06/2017.  Needs psa checked with next labs.   ?

## 2021-11-04 NOTE — Telephone Encounter (Signed)
S/w pt wife - scheduled for 5/5 at 10:15am ?Order placed ?

## 2021-11-08 ENCOUNTER — Other Ambulatory Visit (INDEPENDENT_AMBULATORY_CARE_PROVIDER_SITE_OTHER): Payer: 59

## 2021-11-08 DIAGNOSIS — Z125 Encounter for screening for malignant neoplasm of prostate: Secondary | ICD-10-CM | POA: Diagnosis not present

## 2021-11-08 LAB — PSA: PSA: 0.94 ng/mL (ref 0.10–4.00)

## 2021-11-10 NOTE — Progress Notes (Signed)
?Cardiology Office Note:   ? ?Date:  11/11/2021  ? ?ID:  Dalton [name], DOB [birth year], MRN [id number] ? ?PCP:  Einar Pheasant, MD  ?Cardiologist:  Sinclair Grooms, MD  ? ?Referring MD: Einar Pheasant, MD  ? ?Chief Complaint  ?Patient presents with  ? Atrial Fibrillation  ? Cardiac Valve Problem  ? Follow-up  ?  Neurocardiogenic presyncope  ? ? ?History of Present Illness:   ? ?Dalton [name] is a 56 y.o. male with a hx of  biscupid aortic valve with AS/AR, prior SVT s/p RF ablation, HTN, HLD, AVR 09/2020, primary hypertension, hyperlipidemia, DM II, OSA on CPAP, and chronic combined systolic and diastolic HF EF 53% 12/4678.  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 ? ? ?Dalton [name] continues to have once or twice per month, episodes where he becomes "dizzy and weak but has never fainted".  This can persist for minutes.  He feels better when he sits.  He notices some diaphoresis when these episodes occur. ? ?He has not had bleeding, transient neurological symptoms, peripheral edema, orthopnea, PND, angina, and claudication. ? ?Past Medical History:  ?Diagnosis Date  ? Aortic valve stenosis   ? a. Bicuspid AV - 2D Echo 06/2014 - mod AS, mild AI, mildly dilated aortic root.  ? Arthritis   ? Neck  ? CAD (coronary artery disease)   ? Carotid artery disease ([place])   ? a. Mild by duplex 05/2015 - 1-39% BICA. Repeat due 05/2017.  ? Depression   ? Diabetes mellitus without complication ([place])   ? Dilated aortic root ([place])   ? a. By echo 06/2014.  ? Dyspnea   ? with exercion  ? Dysrhythmia   ? Atrial Fibrilation  ? Environmental allergies   ? Headache   ? MIgraines- rare  ? Heart murmur   ? Hypercholesterolemia   ? Hypertension   ? Obesity (BMI 30-39.9) 04/26/2018  ? PTSD (post-traumatic stress disorder)   ? Sleep apnea   ? CPAP  ? SVT (supraventricular tachycardia) ([place])   ? a. h/o SVT - Ablation done 2013.  ? ? ?Past Surgical History:  ?Procedure Laterality Date  ? AORTIC VALVE REPLACEMENT N/A 10/04/2020  ? Procedure: AORTIC  VALVE REPLACEMENT (AVR);  Surgeon: Gaye Pollack, MD;  Location: [place];  Service: Open Heart Surgery;  Laterality: N/A;  ? CARDIAC ELECTROPHYSIOLOGY STUDY AND ABLATION  08/21/2011  ? COLONOSCOPY WITH PROPOFOL N/A 06/26/2017  ? Procedure: COLONOSCOPY WITH PROPOFOL;  Surgeon: Manya Silvas, MD;  Location: [facility] ENDOSCOPY;  Service: Endoscopy;  Laterality: N/A;  ? ESOPHAGOGASTRODUODENOSCOPY (EGD) WITH PROPOFOL N/A 06/26/2017  ? Procedure: ESOPHAGOGASTRODUODENOSCOPY (EGD) WITH PROPOFOL;  Surgeon: Manya Silvas, MD;  Location: [facility] ENDOSCOPY;  Service: Endoscopy;  Laterality: N/A;  ? MAZE N/A 10/04/2020  ? Procedure: MAZE;  Surgeon: Gaye Pollack, MD;  Location: [place];  Service: Open Heart Surgery;  Laterality: N/A;  ? RIGHT/LEFT HEART CATH AND CORONARY ANGIOGRAPHY N/A 11/16/2019  ? Procedure: RIGHT/LEFT HEART CATH AND CORONARY ANGIOGRAPHY;  Surgeon: [name], MD;  Location: [place] CV LAB;  Service: Cardiovascular;  Laterality: N/A;  ? SEPTOPLASTY N/A 03/22/2015  ? Procedure: SEPTOPLASTY  WITH RIGHT INFERIOR TURBINATE REDUCTION;  Surgeon: Margaretha Sheffield, MD;  Location: [place];  Service: ENT;  Laterality: N/A;  ? SUPRAVENTRICULAR TACHYCARDIA ABLATION N/A 08/21/2011  ? Procedure: SUPRAVENTRICULAR TACHYCARDIA ABLATION;  Surgeon: Evans Lance, MD;  Location: [facility] CATH LAB;  Service: Cardiovascular;  Laterality: N/A;  ? surgery for non descending testicle    ?  TEE WITHOUT CARDIOVERSION N/A 11/16/2019  ? Procedure: TRANSESOPHAGEAL ECHOCARDIOGRAM (TEE);  Surgeon: Geralynn Rile, MD;  Location: [place];  Service: Cardiovascular;  Laterality: N/A;  ? TEE WITHOUT CARDIOVERSION N/A 10/04/2020  ? Procedure: TRANSESOPHAGEAL ECHOCARDIOGRAM (TEE);  Surgeon: Gaye Pollack, MD;  Location: [place];  Service: Open Heart Surgery;  Laterality: N/A;  ? TONSILLECTOMY    ? ? ?Current Medications: ?Current Meds  ?Medication Sig  ? acetaminophen (TYLENOL) 500 MG tablet Take 1,000 mg by mouth every 6 (six)  hours as needed for moderate pain or headache.  ? apixaban (ELIQUIS) 5 MG TABS tablet TAKE 1 TABLET(5 MG) BY MOUTH TWICE DAILY  ? atorvastatin (LIPITOR) 40 MG tablet Take 1 tablet (40 mg total) by mouth daily.  ? buPROPion (WELLBUTRIN XL) 150 MG 24 hr tablet Take 1 tablet (150 mg total) by mouth daily.  ? cetirizine (ZYRTEC) 10 MG tablet Take 10 mg by mouth 2 (two) times daily.   ? citalopram (CELEXA) 40 MG tablet Take 1 tablet (40 mg total) by mouth every morning.  ? CONTOUR NEXT TEST test strip USE AS DIRECTED TO CHECK BLOOD SUGARS TWICE DAILY  ? dapagliflozin propanediol (FARXIGA) 10 MG TABS tablet Take 1 tablet (10 mg total) by mouth daily.  ? digoxin (LANOXIN) 0.125 MG tablet Take 1 tablet (0.125 mg total) by mouth daily.  ? fluticasone (FLONASE) 50 MCG/ACT nasal spray SHAKE LIQUID AND USE 2 SPRAYS IN EACH NOSTRIL DAILY AS NEEDED FOR ALLERGIES OR RHINITIS  ? Lancets (ONETOUCH DELICA PLUS [id number]) MISC USE TWICE DAILY AS DIRECTED  ? metFORMIN (GLUCOPHAGE) 500 MG tablet TAKE 1 TABLET(500 MG) BY MOUTH TWICE DAILY  ? metoprolol succinate (TOPROL XL) 25 MG 24 hr tablet Take 1 tablet (25 mg total) by mouth daily.  ? NON FORMULARY as directed. CPAP  ? OZEMPIC, 1 MG/DOSE, 4 MG/3ML SOPN INJECT 1 MG INTO SKIN ONCE WEEKLY  ? QUEtiapine (SEROQUEL) 50 MG tablet Take 1 tablet (50 mg total) by mouth at bedtime.  ? sacubitril-valsartan (ENTRESTO) 24-26 MG Take 1 tablet by mouth 2 (two) times daily.  ?  ? ?Allergies:   Patient has no known allergies.  ? ?Social History  ? ?Socioeconomic History  ? Marital status: Married  ?  Spouse name: Not on file  ? Number of children: 2  ? Years of education: Not on file  ? Highest education level: Not on file  ?Occupational History  ? Occupation: truck Geophysicist/field seismologist  ?  Employer: [employer]  ?Tobacco Use  ? Smoking status: Former  ?  Types: Cigarettes  ?  Quit date: 11/04/1988  ?  Years since quitting: 33.0  ? Smokeless tobacco: Former  ?Vaping Use  ? Vaping Use: Never used  ?Substance and  Sexual Activity  ? Alcohol use: Yes  ?  Comment: occasionally  ? Drug use: Not Currently  ?  Comment: many years ago in highschool  ? Sexual activity: Not Currently  ?Other Topics Concern  ? Not on file  ?Social History Narrative  ? Very rare exercise  ? ?Social Determinants of Health  ? ?Financial Resource Strain: Low Risk   ? Difficulty of Paying Living Expenses: Not hard at all  ?Food Insecurity: Not on file  ?Transportation Needs: Not on file  ?Physical Activity: Not on file  ?Stress: Not on file  ?Social Connections: Not on file  ?  ? ?Family History: ?The patient's family history includes Aortic stenosis in his maternal grandfather; Arthritis in his mother; Breast cancer in  his maternal grandmother; Cancer in his father and maternal grandfather; Diabetes in his mother; Heart attack in his father; Heart disease in his mother; Kidney disease in his father. ? ?ROS:   ?Please see the history of present illness.    ?He seems to have borderline depression.  His wife is always with him when he comes in.  She watches him like a hawk.  He has been able to work and not have any absences.  All other systems reviewed and are negative. ? ?EKGs/Labs/Other Studies Reviewed:   ? ?The following studies were reviewed today: ? ?ECHOCARDIOGRAPHY 2022 ?IMPRESSIONS  ? 1. Left ventricular ejection fraction, by estimation, is 40 to 45%. The  ?left ventricle has mildly decreased function. The left ventricle  ?demonstrates global hypokinesis. There is mild left ventricular  ?hypertrophy. Left ventricular diastolic parameters  ?are consistent with Grade I diastolic dysfunction (impaired relaxation).  ? 2. Right ventricular systolic function is normal. The right ventricular  ?size is normal.  ? 3. The mitral valve is normal in structure. No evidence of mitral valve  ?regurgitation. No evidence of mitral stenosis.  ? 4. The aortic valve has been repaired/replaced. Aortic valve  ?regurgitation is not visualized. No aortic stenosis is  present. There is a  ?25 mm Edwards valve present in the aortic position. Procedure Date:  ?10/04/20. Echo findings are consistent with normal  ? structure and function of the aortic valve prosthesis. Aortic valv

## 2021-11-11 ENCOUNTER — Encounter: Payer: Self-pay | Admitting: Interventional Cardiology

## 2021-11-11 ENCOUNTER — Ambulatory Visit (INDEPENDENT_AMBULATORY_CARE_PROVIDER_SITE_OTHER): Payer: 59 | Admitting: Interventional Cardiology

## 2021-11-11 VITALS — BP 118/78 | HR 85 | Ht 73.0 in | Wt 250.0 lb

## 2021-11-11 DIAGNOSIS — I4819 Other persistent atrial fibrillation: Secondary | ICD-10-CM

## 2021-11-11 DIAGNOSIS — Z952 Presence of prosthetic heart valve: Secondary | ICD-10-CM | POA: Diagnosis not present

## 2021-11-11 DIAGNOSIS — I10 Essential (primary) hypertension: Secondary | ICD-10-CM

## 2021-11-11 DIAGNOSIS — I5042 Chronic combined systolic (congestive) and diastolic (congestive) heart failure: Secondary | ICD-10-CM

## 2021-11-11 DIAGNOSIS — R55 Syncope and collapse: Secondary | ICD-10-CM

## 2021-11-11 DIAGNOSIS — G4733 Obstructive sleep apnea (adult) (pediatric): Secondary | ICD-10-CM

## 2021-11-11 DIAGNOSIS — I25118 Atherosclerotic heart disease of native coronary artery with other forms of angina pectoris: Secondary | ICD-10-CM

## 2021-11-11 DIAGNOSIS — Z7901 Long term (current) use of anticoagulants: Secondary | ICD-10-CM

## 2021-11-11 NOTE — Patient Instructions (Signed)
Medication Instructions:  ?Your physician recommends that you continue on your current medications as directed. Please refer to the Current Medication list given to you today. ? ?*If you need a refill on your cardiac medications before your next appointment, please call your pharmacy* ? ?Lab Work: ?NONE ? ?Testing/Procedures: ?NONE ? ?Follow-Up: ?At [facility], you and your health needs are our priority.  As part of our continuing mission to provide you with exceptional heart care, we have created designated Provider Care Teams.  These Care Teams include your primary Cardiologist (physician) and Advanced Practice Providers (APPs -  Physician Assistants and Nurse Practitioners) who all work together to provide you with the care you need, when you need it. ? ?Your next appointment:   ?9-12 month(s) ? ?The format for your next appointment:   ?In Person ? ?Provider:   ?Henry W Smith III, MD { ? ? ? ?Important Information About Sugar ? ? ? ? ?  ?

## 2021-11-12 ENCOUNTER — Other Ambulatory Visit: Payer: Self-pay | Admitting: Interventional Cardiology

## 2021-11-12 MED ORDER — SACUBITRIL-VALSARTAN 24-26 MG PO TABS
1.0000 | ORAL_TABLET | Freq: Two times a day (BID) | ORAL | 11 refills | Status: DC
Start: 2021-11-12 — End: 2023-01-19

## 2021-11-14 NOTE — Progress Notes (Signed)
BH MD/PA/NP OP Progress Note ? ?11/18/2021 9:48 AM ?Dalton [name]  ?MRN:  [id number] ? ?Chief Complaint:  ?Chief Complaint  ?Patient presents with  ? Follow-up  ? ?HPI:  ?This is a follow-up appointment for depression, PTSD and insomnia.  ?He states that he is stressed.  He now owes money to IRS. He states that it has never happened before, stating that he was filing taxes on time.  He is also concerned as he does not have much work hours compared to before at the current workplace.  He is the only person, who works.  He states that although he may consider selling the house, he does not want to do this in relation to his grandfather.  Although he went to a trip with his wife last weekend, he was not able to enjoy the time.  He has insomnia, thinking about this.  He also partly attributes his insomnia to him having difficulty with CPAP machine.  He is in the process of getting the right setting.  He has depressive symptoms as in PHQ-9.  He denies SI.  He continues to have dizziness at times; he was seen by cardiologist.  He agrees to continue current medication regimen at this time.  ? ?Wt Readings from Last 3 Encounters:  ?11/18/21 248 lb 3.2 oz (112.6 kg)  ?11/11/21 250 lb (113.4 kg)  ?10/25/21 247 lb 6.4 oz (112.2 kg)  ?  ?Visit Diagnosis:  ?  ICD-10-CM   ?1. PTSD (post-traumatic stress disorder)  F43.10   ?  ?2. Moderate episode of recurrent major depressive disorder (HCC)  F33.1   ?  ?3. Insomnia, unspecified type  G47.00   ?  ? ? ?Past Psychiatric History: Please see initial evaluation for full details. I have reviewed the history. No updates at this time.  ?  ? ?Past Medical History:  ?Past Medical History:  ?Diagnosis Date  ? Aortic valve stenosis   ? a. Bicuspid AV - 2D Echo 06/2014 - mod AS, mild AI, mildly dilated aortic root.  ? Arthritis   ? Neck  ? CAD (coronary artery disease)   ? Carotid artery disease ([place])   ? a. Mild by duplex 05/2015 - 1-39% BICA. Repeat due 05/2017.  ? Depression   ? Diabetes  mellitus without complication ([place])   ? Dilated aortic root ([place])   ? a. By echo 06/2014.  ? Dyspnea   ? with exercion  ? Dysrhythmia   ? Atrial Fibrilation  ? Environmental allergies   ? Headache   ? MIgraines- rare  ? Heart murmur   ? Hypercholesterolemia   ? Hypertension   ? Obesity (BMI 30-39.9) 04/26/2018  ? PTSD (post-traumatic stress disorder)   ? Sleep apnea   ? CPAP  ? SVT (supraventricular tachycardia) ([place])   ? a. h/o SVT - Ablation done 2013.  ?  ?Past Surgical History:  ?Procedure Laterality Date  ? AORTIC VALVE REPLACEMENT N/A 10/04/2020  ? Procedure: AORTIC VALVE REPLACEMENT (AVR);  Surgeon: Gaye Pollack, MD;  Location: [place];  Service: Open Heart Surgery;  Laterality: N/A;  ? CARDIAC ELECTROPHYSIOLOGY STUDY AND ABLATION  08/21/2011  ? COLONOSCOPY WITH PROPOFOL N/A 06/26/2017  ? Procedure: COLONOSCOPY WITH PROPOFOL;  Surgeon: Manya Silvas, MD;  Location: [facility] ENDOSCOPY;  Service: Endoscopy;  Laterality: N/A;  ? ESOPHAGOGASTRODUODENOSCOPY (EGD) WITH PROPOFOL N/A 06/26/2017  ? Procedure: ESOPHAGOGASTRODUODENOSCOPY (EGD) WITH PROPOFOL;  Surgeon: Manya Silvas, MD;  Location: [facility] ENDOSCOPY;  Service: Endoscopy;  Laterality: N/A;  ?  MAZE N/A 10/04/2020  ? Procedure: MAZE;  Surgeon: Gaye Pollack, MD;  Location: [place];  Service: Open Heart Surgery;  Laterality: N/A;  ? RIGHT/LEFT HEART CATH AND CORONARY ANGIOGRAPHY N/A 11/16/2019  ? Procedure: RIGHT/LEFT HEART CATH AND CORONARY ANGIOGRAPHY;  Surgeon: [name], MD;  Location: [place] CV LAB;  Service: Cardiovascular;  Laterality: N/A;  ? SEPTOPLASTY N/A 03/22/2015  ? Procedure: SEPTOPLASTY  WITH RIGHT INFERIOR TURBINATE REDUCTION;  Surgeon: Margaretha Sheffield, MD;  Location: [place];  Service: ENT;  Laterality: N/A;  ? SUPRAVENTRICULAR TACHYCARDIA ABLATION N/A 08/21/2011  ? Procedure: SUPRAVENTRICULAR TACHYCARDIA ABLATION;  Surgeon: Evans Lance, MD;  Location: [facility] CATH LAB;  Service: Cardiovascular;  Laterality: N/A;  ? surgery for  non descending testicle    ? TEE WITHOUT CARDIOVERSION N/A 11/16/2019  ? Procedure: TRANSESOPHAGEAL ECHOCARDIOGRAM (TEE);  Surgeon: Geralynn Rile, MD;  Location: [place];  Service: Cardiovascular;  Laterality: N/A;  ? TEE WITHOUT CARDIOVERSION N/A 10/04/2020  ? Procedure: TRANSESOPHAGEAL ECHOCARDIOGRAM (TEE);  Surgeon: Gaye Pollack, MD;  Location: [place];  Service: Open Heart Surgery;  Laterality: N/A;  ? TONSILLECTOMY    ? ? ?Family Psychiatric History: Please see initial evaluation for full details. I have reviewed the history. No updates at this time.  ?  ? ?Family History:  ?Family History  ?Problem Relation Age of Onset  ? Diabetes Mother   ? Heart disease Mother   ? Arthritis Mother   ? Heart attack Father   ? Kidney disease Father   ? Cancer Father   ? Breast cancer Maternal Grandmother   ? Cancer Maternal Grandfather   ? Aortic stenosis Maternal Grandfather   ? ? ?Social History:  ?Social History  ? ?Socioeconomic History  ? Marital status: Married  ?  Spouse name: Not on file  ? Number of children: 2  ? Years of education: Not on file  ? Highest education level: Not on file  ?Occupational History  ? Occupation: truck Geophysicist/field seismologist  ?  Employer: [employer]  ?Tobacco Use  ? Smoking status: Former  ?  Types: Cigarettes  ?  Quit date: 11/04/1988  ?  Years since quitting: 33.0  ? Smokeless tobacco: Former  ?Vaping Use  ? Vaping Use: Never used  ?Substance and Sexual Activity  ? Alcohol use: Yes  ?  Comment: occasionally  ? Drug use: Not Currently  ?  Comment: many years ago in highschool  ? Sexual activity: Not Currently  ?Other Topics Concern  ? Not on file  ?Social History Narrative  ? Very rare exercise  ? ?Social Determinants of Health  ? ?Financial Resource Strain: Low Risk   ? Difficulty of Paying Living Expenses: Not hard at all  ?Food Insecurity: Not on file  ?Transportation Needs: Not on file  ?Physical Activity: Not on file  ?Stress: Not on file  ?Social Connections: Not on file  ? ? ?Allergies:  No Known Allergies ? ?Metabolic Disorder Labs: ?Lab Results  ?Component Value Date  ? HGBA1C 6.2 10/25/2021  ? MPG 128.37 09/20/2020  ? ?No results found for: PROLACTIN ?Lab Results  ?Component Value Date  ? CHOL 133 10/25/2021  ? TRIG 126.0 10/25/2021  ? HDL 33.90 (L) 10/25/2021  ? CHOLHDL 4 10/25/2021  ? VLDL 25.2 10/25/2021  ? [place] 74 10/25/2021  ? [place] 74 04/26/2020  ? ?Lab Results  ?Component Value Date  ? TSH 0.97 10/25/2021  ? TSH 1.22 06/09/2019  ? ? ?Therapeutic Level Labs: ?No  results found for: LITHIUM ?No results found for: VALPROATE ?No components found for:  CBMZ ? ?Current Medications: ?Current Outpatient Medications  ?Medication Sig Dispense Refill  ? acetaminophen (TYLENOL) 500 MG tablet Take 1,000 mg by mouth every 6 (six) hours as needed for moderate pain or headache.    ? apixaban (ELIQUIS) 5 MG TABS tablet TAKE 1 TABLET(5 MG) BY MOUTH TWICE DAILY 180 tablet 1  ? atorvastatin (LIPITOR) 40 MG tablet Take 1 tablet (40 mg total) by mouth daily. 90 tablet 3  ? buPROPion (WELLBUTRIN XL) 150 MG 24 hr tablet Take 1 tablet (150 mg total) by mouth daily. 90 tablet 1  ? cetirizine (ZYRTEC) 10 MG tablet Take 10 mg by mouth 2 (two) times daily.     ? CONTOUR NEXT TEST test strip USE AS DIRECTED TO CHECK BLOOD SUGARS TWICE DAILY 100 strip 1  ? dapagliflozin propanediol (FARXIGA) 10 MG TABS tablet Take 1 tablet (10 mg total) by mouth daily. 90 tablet 1  ? digoxin (LANOXIN) 0.125 MG tablet Take 1 tablet (0.125 mg total) by mouth daily. 90 tablet 3  ? fluticasone (FLONASE) 50 MCG/ACT nasal spray SHAKE LIQUID AND USE 2 SPRAYS IN EACH NOSTRIL DAILY AS NEEDED FOR ALLERGIES OR RHINITIS 48 g 1  ? Lancets (ONETOUCH DELICA PLUS [id number]) MISC USE TWICE DAILY AS DIRECTED 100 each 7  ? metFORMIN (GLUCOPHAGE) 500 MG tablet TAKE 1 TABLET(500 MG) BY MOUTH TWICE DAILY 180 tablet 1  ? metoprolol succinate (TOPROL XL) 25 MG 24 hr tablet Take 1 tablet (25 mg total) by mouth daily. 90 tablet 3  ? NON FORMULARY as  directed. CPAP    ? OZEMPIC, 1 MG/DOSE, 4 MG/3ML SOPN INJECT 1 MG INTO SKIN ONCE WEEKLY 3 mL 2  ? sacubitril-valsartan (ENTRESTO) 24-26 MG Take 1 tablet by mouth 2 (two) times daily. 60 tablet 11  ? [START

## 2021-11-17 ENCOUNTER — Other Ambulatory Visit: Payer: Self-pay | Admitting: Internal Medicine

## 2021-11-17 DIAGNOSIS — E1165 Type 2 diabetes mellitus with hyperglycemia: Secondary | ICD-10-CM

## 2021-11-18 ENCOUNTER — Ambulatory Visit (INDEPENDENT_AMBULATORY_CARE_PROVIDER_SITE_OTHER): Payer: 59 | Admitting: Psychiatry

## 2021-11-18 ENCOUNTER — Encounter: Payer: Self-pay | Admitting: Psychiatry

## 2021-11-18 ENCOUNTER — Telehealth: Payer: 59

## 2021-11-18 VITALS — BP 110/74 | HR 45 | Temp 98.2°F | Wt 248.2 lb

## 2021-11-18 DIAGNOSIS — F331 Major depressive disorder, recurrent, moderate: Secondary | ICD-10-CM

## 2021-11-18 DIAGNOSIS — F4310 Post-traumatic stress disorder, unspecified: Secondary | ICD-10-CM

## 2021-11-18 DIAGNOSIS — G4700 Insomnia, unspecified: Secondary | ICD-10-CM

## 2021-11-18 MED ORDER — CITALOPRAM HYDROBROMIDE 40 MG PO TABS: 40.0000 mg | ORAL_TABLET | Freq: Every morning | ORAL | 1 refills | Status: DC

## 2021-11-18 MED ORDER — QUETIAPINE FUMARATE 50 MG PO TABS: 50.0000 mg | ORAL_TABLET | Freq: Every day | ORAL | 0 refills | Status: DC

## 2021-12-02 ENCOUNTER — Encounter: Payer: Self-pay | Admitting: Internal Medicine

## 2021-12-03 ENCOUNTER — Other Ambulatory Visit: Payer: Self-pay

## 2021-12-03 MED ORDER — DAPAGLIFLOZIN PROPANEDIOL 10 MG PO TABS: 10.0000 mg | ORAL_TABLET | Freq: Every day | ORAL | 3 refills | Status: DC

## 2021-12-14 ENCOUNTER — Other Ambulatory Visit: Payer: Self-pay | Admitting: Psychiatry

## 2021-12-20 LAB — HM DIABETES EYE EXAM

## 2021-12-30 NOTE — Progress Notes (Signed)
Virtual Visit via Video Note  I connected with Dalton [name] on 01/03/22 at  8:00 AM EDT by a video enabled telemedicine application and verified that I am speaking with the correct person using two identifiers.  Location: Patient: outside Provider: office Persons participated in the visit- patient, provider    I discussed the limitations of evaluation and management by telemedicine and the availability of in person appointments. The patient expressed understanding and agreed to proceed.    I discussed the assessment and treatment plan with the patient. The patient was provided an opportunity to ask questions and all were answered. The patient agreed with the plan and demonstrated an understanding of the instructions.   The patient was advised to call back or seek an in-person evaluation if the symptoms worsen or if the condition fails to improve as anticipated.  I provided 15 minutes of non-face-to-face time during this encounter.   Dalton Clay, MD     [facility] MD/PA/NP OP Progress Note  01/03/2022 8:29 AM Dalton [name]  MRN:  [id number]  Chief Complaint:  Chief Complaint  Patient presents with   Follow-up   Trauma   Depression   HPI:  This is a follow-up appointment for PTSD and depression.  He states that he is not doing good.  Everything at work has slowed down.  He is concerned due to financial strain.  He does not have money to pay for IRS, and he is being stressed.  His wife told him that he has been grumpy and hateful. He thinks that is true.  He tries to go out in the yard, stays to himself when he feels stressed.  He has worsening in insomnia.  He feels that he cannot get shut down his brain due to being worried.  He has been eating more junk food, and has gained some weight.  He did have a few passive SI, although he denies any intent or plan.  He agrees to contact emergency resources if needed.  He drinks a few glasses of wine occasionally.  He denies drug use.  He  is willing to uptitrate bupropion.  He continues to have dizziness at times; he agrees to contact his cardiologist regarding his medication.     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 He was in [place] after 911, no combat experience Support: wife Household: Dalton [name]/his wife Marital status: married with his wife of six years Number of children: 2 daughters, age 51, 74, grandchild, 39 in march,    Visit Diagnosis:    ICD-10-CM   1. PTSD (post-traumatic stress disorder)  F43.10     2. Moderate episode of recurrent major depressive disorder (HCC)  F33.1     3. Insomnia, unspecified type  G47.00       Past Psychiatric History: Please see initial evaluation for full details. I have reviewed the history. No updates at this time.     Past Medical History:  Past Medical History:  Diagnosis Date   Aortic valve stenosis    a. Bicuspid AV - 2D Echo 06/2014 - mod AS, mild AI, mildly dilated aortic root.   Arthritis    Neck   CAD (coronary artery disease)  Carotid artery disease ([place])    a. Mild by duplex 05/2015 - 1-39% BICA. Repeat due 05/2017.   Depression    Diabetes mellitus without complication ([place])    Dilated aortic root ([place])    a. By echo 06/2014.   Dyspnea    with exercion   Dysrhythmia    Atrial Fibrilation   Environmental allergies    Headache    MIgraines- rare   Heart murmur    Hypercholesterolemia    Hypertension    Obesity (BMI 30-39.9) 04/26/2018   PTSD (post-traumatic stress disorder)    Sleep apnea    CPAP   SVT (supraventricular tachycardia) ([place])    a. h/o SVT - Ablation done 2013.    Past Surgical History:  Procedure Laterality Date   AORTIC VALVE REPLACEMENT N/A 10/04/2020   Procedure: AORTIC VALVE  REPLACEMENT (AVR);  Surgeon: Gaye Pollack, MD;  Location: [place];  Service: Open Heart Surgery;  Laterality: N/A;   CARDIAC ELECTROPHYSIOLOGY STUDY AND ABLATION  08/21/2011   COLONOSCOPY WITH PROPOFOL N/A 06/26/2017   Procedure: COLONOSCOPY WITH PROPOFOL;  Surgeon: Manya Silvas, MD;  Location[facility] ENDOSCOPY;  Service: Endoscopy;  Laterality: N/A;   ESOPHAGOGASTRODUODENOSCOPY (EGD) WITH PROPOFOL N/A 06/26/2017   Procedure: ESOPHAGOGASTRODUODENOSCOPY (EGD) WITH PROPOFOL;  Surgeon: Manya Silvas, MD;  Location[facility] ENDOSCOPY;  Service: Endoscopy;  Laterality: N/A;   MAZE N/A 10/04/2020   Procedure: MAZE;  Surgeon: Gaye Pollack, MD;  Location: [place];  Service: Open Heart Surgery;  Laterality: N/A;   RIGHT/LEFT HEART CATH AND CORONARY ANGIOGRAPHY N/A 11/16/2019   Procedure: RIGHT/LEFT HEART CATH AND CORONARY ANGIOGRAPHY;  Surgeon: [name], MD;  Location: [place] CV LAB;  Service: Cardiovascular;  Laterality: N/A;   SEPTOPLASTY N/A 03/22/2015   Procedure: SEPTOPLASTY  WITH RIGHT INFERIOR TURBINATE REDUCTION;  Surgeon: Margaretha Sheffield, MD;  Location: [place];  Service: ENT;  Laterality: N/A;   SUPRAVENTRICULAR TACHYCARDIA ABLATION N/A 08/21/2011   Procedure: SUPRAVENTRICULAR TACHYCARDIA ABLATION;  Surgeon: Evans Lance, MD;  Location[facility] CATH LAB;  Service: Cardiovascular;  Laterality: N/A;   surgery for non descending testicle     TEE WITHOUT CARDIOVERSION N/A 11/16/2019   Procedure: TRANSESOPHAGEAL ECHOCARDIOGRAM (TEE);  Surgeon: Geralynn Rile, MD;  Location: [place];  Service: Cardiovascular;  Laterality: N/A;   TEE WITHOUT CARDIOVERSION N/A 10/04/2020   Procedure: TRANSESOPHAGEAL ECHOCARDIOGRAM (TEE);  Surgeon: Gaye Pollack, MD;  Location: [place];  Service: Open Heart Surgery;  Laterality: N/A;   TONSILLECTOMY      Family Psychiatric History: Please see initial evaluation for full details. I have reviewed the history. No updates at this time.      Family History:  Family History  Problem Relation Age of Onset   Diabetes Mother    Heart disease Mother    Arthritis Mother    Heart attack Father    Kidney disease Father    Cancer Father    Breast cancer Maternal Grandmother    Cancer Maternal Grandfather    Aortic stenosis Maternal Grandfather     Social History:  Social History   Socioeconomic History   Marital status: Married    Spouse name: Not on file   Number of children: 2   Years of education: Not on file   Highest education level: Not on file  Occupational History   Occupation: truck driver    Employer: [employer]  Tobacco Use   Smoking status: Former    Types: Cigarettes  Quit date: 11/04/1988    Years since quitting: 33.1   Smokeless tobacco: Former  Scientific laboratory technician Use: Never used  Substance and Sexual Activity   Alcohol use: Yes    Comment: occasionally   Drug use: Not Currently    Comment: many years ago in highschool   Sexual activity: Not Currently  Other Topics Concern   Not on file  Social History Narrative   Very rare exercise   Social Determinants of Health   Financial Resource Strain: Low Risk  (08/19/2021)   Overall Financial Resource Strain (CARDIA)    Difficulty of Paying Living Expenses: Not hard at all  Food Insecurity: Not on file  Transportation Needs: Not on file  Physical Activity: Not on file  Stress: Not on file  Social Connections: Not on file    Allergies: No Known Allergies  Metabolic Disorder Labs: Lab Results  Component Value Date   HGBA1C 6.2 10/25/2021   MPG 128.37 09/20/2020   No results found for: "PROLACTIN" Lab Results  Component Value Date   CHOL 133 10/25/2021   TRIG 126.0 10/25/2021   HDL 33.90 (L) 10/25/2021   CHOLHDL 4 10/25/2021   VLDL 25.2 10/25/2021   LDLCALC 74 10/25/2021   LDLCALC 74 04/26/2020   Lab Results  Component Value Date   TSH 0.97 10/25/2021   TSH 1.22 06/09/2019    Therapeutic Level Labs: No results found  for: "LITHIUM" No results found for: "VALPROATE" No results found for: "CBMZ"  Current Medications: Current Outpatient Medications  Medication Sig Dispense Refill   acetaminophen (TYLENOL) 500 MG tablet Take 1,000 mg by mouth every 6 (six) hours as needed for moderate pain or headache.     apixaban (ELIQUIS) 5 MG TABS tablet TAKE 1 TABLET(5 MG) BY MOUTH TWICE DAILY 180 tablet 1   atorvastatin (LIPITOR) 40 MG tablet Take 1 tablet (40 mg total) by mouth daily. 90 tablet 3   buPROPion (WELLBUTRIN XL) 150 MG 24 hr tablet Take 1 tablet (150 mg total) by mouth daily. 90 tablet 1   cetirizine (ZYRTEC) 10 MG tablet Take 10 mg by mouth 2 (two) times daily.      citalopram (CELEXA) 40 MG tablet Take 1 tablet (40 mg total) by mouth every morning. 90 tablet 1   CONTOUR NEXT TEST test strip USE AS DIRECTED TO CHECK BLOOD SUGARS TWICE DAILY 100 strip 1   dapagliflozin propanediol (FARXIGA) 10 MG TABS tablet Take 1 tablet (10 mg total) by mouth daily before breakfast. 30 tablet 3   digoxin (LANOXIN) 0.125 MG tablet Take 1 tablet (0.125 mg total) by mouth daily. 90 tablet 3   fluticasone (FLONASE) 50 MCG/ACT nasal spray SHAKE LIQUID AND USE 2 SPRAYS IN EACH NOSTRIL DAILY AS NEEDED FOR ALLERGIES OR RHINITIS 48 g 1   Lancets (ONETOUCH DELICA PLUS [id number]) MISC USE TWICE DAILY AS DIRECTED 100 each 7   metFORMIN (GLUCOPHAGE) 500 MG tablet TAKE 1 TABLET(500 MG) BY MOUTH TWICE DAILY 180 tablet 1   metoprolol succinate (TOPROL XL) 25 MG 24 hr tablet Take 1 tablet (25 mg total) by mouth daily. 90 tablet 3   NON FORMULARY as directed. CPAP     OZEMPIC, 1 MG/DOSE, 4 MG/3ML SOPN INJECT 1 MG INTO SKIN ONCE WEEKLY 3 mL 2   QUEtiapine (SEROQUEL) 50 MG tablet Take 1 tablet (50 mg total) by mouth at bedtime. 90 tablet 0   sacubitril-valsartan (ENTRESTO) 24-26 MG Take 1 tablet by mouth 2 (two) times daily. [place]  tablet 11   No current facility-administered medications for this visit.     Musculoskeletal: Strength &  Muscle Tone:  N/A Gait & Station:  N/A Patient leans: N/A  Psychiatric Specialty Exam: Review of Systems  Psychiatric/Behavioral:  Positive for decreased concentration, dysphoric mood and sleep disturbance. Negative for agitation, behavioral problems, confusion, hallucinations, self-injury and suicidal ideas. The patient is nervous/anxious. The patient is not hyperactive.   All other systems reviewed and are negative.   There were no vitals taken for this visit.There is no height or weight on file to calculate BMI.  General Appearance: Fairly Groomed  Eye Contact:  Good  Speech:  Clear and Coherent  Volume:  Normal  Mood:   not good  Affect:  Appropriate, Congruent, and fatigue  Thought Process:  Coherent  Orientation:  Full (Time, Place, and Person)  Thought Content: Logical   Suicidal Thoughts:  Yes.  without intent/plan  Homicidal Thoughts:  No  Memory:  Immediate;   Good  Judgement:  Good  Insight:  Good  Psychomotor Activity:  Normal  Concentration:  Concentration: Good and Attention Span: Good  Recall:  Good  Fund of Knowledge: Good  Language: Good  Akathisia:  No  Handed:  Right  AIMS (if indicated): not done  Assets:  Communication Skills Desire for Improvement  ADL's:  Intact  Cognition: WNL  Sleep:  Poor   Screenings: GAD-7    Health and safety inspector from 07/27/2020 in [place]  Total GAD-7 Score 13      PHQ2-9    [place] Office Visit from 11/18/2021 in [place] from 04/08/2021 in [place] from 01/28/2021 in [place] from 12/18/2020 in [place] from 07/27/2020 in [place]  PHQ-2 Total Score '4 3 4 4 3  '$ PHQ-9 Total Score '14 6 20 17 12      '$ Flowsheet Row Counselor from 05/14/2021 in [place] from 03/05/2021 in [place] Counselor from 01/28/2021 in [place] No Risk Low Risk Low Risk        Assessment and Plan:  SKYLOR SCHNAPP is a 56 y.o. year old male with a history of PTSD, depression, anxiety, insomnia, OSA on CPAP machine, mixed aortic stenosis and regurgitation secondary to bicuspid AV, chronic heart failure, A fib on eliquis,hypertension, type II DM,  cervical radiculopathy, degeneration of intervertebral disk, sleep apnea , who presents for follow up appointment for below.    1. PTSD (post-traumatic stress disorder) 2. Moderate episode of recurrent major depressive disorder ([place]) He continues to report depressive symptoms and anxiety in the context of financial strain.  Other psychosocial stressors includes his wife, who has symptoms including seizure after COVID, and he is a English as a second language teacher.  We uptitrate bupropion to optimize treatment for depression.  Discussed potential risk of headache and palpitation.  Will continue citalopram to target PTSD and depression.  Will continue quetiapine adjunctive treatment for depression.  Discussed again regarding the risk of metabolic side effect, drowsiness.   3. Insomnia, unspecified type Worsening as his mood worsens. He uses CPAP machine.  Will continue quetiapine at the current dose to target insomnia given he reports drowsiness.     # bradycardia He has bradycardia on today's evaluation.  He was advised again to contact his cardiologist if this persists especially given his episodes of dizziness/on metoprolol.  Plan   Continue Citalopram 40 mg daily Increase bupropion 300 mg daily Continue quetiapine 50 mg at night - monitor drowsiness (QTc 405 msec 10/2020) Next appointment: 7/28 at 9:30 for 30 mins, video   Past trials of medication: citalopram, lamotrigine, quetiapine, Trazodone,    The patient demonstrates the following risk factors  for suicide: Chronic risk factors for suicide include: psychiatric disorder of depression, PTSD. Acute risk factors for suicide include: N/A. Protective factors for this patient include: positive social support, responsibility to others (children, family), coping skills and hope for the future. Considering these factors, the overall suicide risk at this point appears to be low. Patient is appropriate for outpatient follow up.            Collaboration of Care: Collaboration of Care: Other N/A  Patient/Guardian was advised Release of Information must be obtained prior to any record release in order to collaborate their care with an outside provider. Patient/Guardian was advised if they have not already done so to contact the registration department to sign all necessary forms in order for [place] to release information regarding their care.   Consent: Patient/Guardian gives verbal consent for treatment and assignment of benefits for services provided during this visit. Patient/Guardian expressed understanding and agreed to proceed.    Dalton Clay, MD 01/03/2022, 8:29 AM

## 2022-01-03 ENCOUNTER — Encounter: Payer: Self-pay | Admitting: Psychiatry

## 2022-01-03 ENCOUNTER — Other Ambulatory Visit: Payer: Self-pay | Admitting: Internal Medicine

## 2022-01-03 ENCOUNTER — Telehealth (INDEPENDENT_AMBULATORY_CARE_PROVIDER_SITE_OTHER): Payer: 59 | Admitting: Psychiatry

## 2022-01-03 DIAGNOSIS — F331 Major depressive disorder, recurrent, moderate: Secondary | ICD-10-CM | POA: Diagnosis not present

## 2022-01-03 DIAGNOSIS — G4700 Insomnia, unspecified: Secondary | ICD-10-CM

## 2022-01-03 DIAGNOSIS — F4310 Post-traumatic stress disorder, unspecified: Secondary | ICD-10-CM | POA: Diagnosis not present

## 2022-01-03 DIAGNOSIS — E1165 Type 2 diabetes mellitus with hyperglycemia: Secondary | ICD-10-CM

## 2022-01-03 NOTE — Patient Instructions (Signed)
Continue Citalopram 40 mg daily Increase bupropion 300 mg daily Continue quetiapine 50 mg at night  Next appointment: 7/28 at 9:30

## 2022-01-11 ENCOUNTER — Other Ambulatory Visit: Payer: Self-pay | Admitting: Interventional Cardiology

## 2022-01-11 ENCOUNTER — Encounter: Payer: Self-pay | Admitting: Interventional Cardiology

## 2022-01-11 DIAGNOSIS — I4891 Unspecified atrial fibrillation: Secondary | ICD-10-CM

## 2022-01-13 NOTE — Telephone Encounter (Signed)
Prescription refill request for Eliquis received. Indication: Atrial Fib Last office visit: 11/11/21  Linard Millers MD Scr: 1.03 on 10/25/21 Age: 56 Weight: 113.4kg  Based on above findings Eliquis '5mg'$  twice daily is the appropriate dose.  Refill approved.

## 2022-01-14 ENCOUNTER — Encounter: Payer: Self-pay | Admitting: Interventional Cardiology

## 2022-01-23 ENCOUNTER — Encounter: Payer: Self-pay | Admitting: Interventional Cardiology

## 2022-01-28 NOTE — Progress Notes (Signed)
Virtual Visit via Video Note  I connected with Dalton [name] on 01/31/22 at  9:30 AM EDT by a video enabled telemedicine application and verified that I am speaking with the correct person using two identifiers.  Location: Patient: home Provider: office Persons participated in the visit- patient, provider    I discussed the limitations of evaluation and management by telemedicine and the availability of in person appointments. The patient expressed understanding and agreed to proceed.    I discussed the assessment and treatment plan with the patient. The patient was provided an opportunity to ask questions and all were answered. The patient agreed with the plan and demonstrated an understanding of the instructions.   The patient was advised to call back or seek an in-person evaluation if the symptoms worsen or if the condition fails to improve as anticipated.  I provided 15 minutes of non-face-to-face time during this encounter.   [name], MD    [facility] MD/PA/NP OP Progress Note  01/31/2022 10:05 AM Dalton [name]  MRN:  [id number]  Chief Complaint:  Chief Complaint  Patient presents with   Follow-up   Trauma   HPI:  This is a follow-up appointment for depression and PTSD.  He states that he is feeling good as he has just found out his dog, who was missing for the past 3 days.  He was very concerned about his dog.  His wife has been in the hospital for the past week for evaluation of seizure.  He was missing her when he comes back home.  Although he is still concerned about payment for IRS, he is trying to take things as they come.  He is trying to learn to accept it.  He thinks he has been doing that since uptitration of bupropion without any side effect.  He has middle insomnia.  He has decrease in appetite due to working in the heat.  Although he feels down and anxious at times, it has been manageable.  He denies SI.  Although he has nightmares, he does not recall it.  He  has avoidance, and has flashback.  He is willing to try higher dose of quetiapine at this time.    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 He was in [place] after 911, no combat experience Support: wife Household: [name]/his wife Marital status: married with his wife of six years Number of children: 2 daughters, age 47, 29, grandchild, 19 in march,   Visit Diagnosis:    ICD-10-CM   1. PTSD (post-traumatic stress disorder)  F43.10     2. Moderate episode of recurrent major depressive disorder (HCC)  F33.1     3. Insomnia, unspecified type  G47.00       Past Psychiatric History: Please see initial evaluation for full details. I have reviewed the history. No updates at this time.     Past Medical History:  Past Medical History:  Diagnosis Date   Aortic valve stenosis    a. Bicuspid AV - 2D Echo 06/2014 - mod AS, mild AI, mildly dilated aortic root.   Arthritis    Neck   CAD (coronary artery disease)    Carotid artery disease ([place])    a. Mild by duplex 05/2015 - 1-39% BICA. Repeat due 05/2017.   Depression    Diabetes mellitus without complication ([place])    Dilated aortic root ([place])  a. By echo 06/2014.   Dyspnea    with exercion   Dysrhythmia    Atrial Fibrilation   Environmental allergies    Headache    MIgraines- rare   Heart murmur    Hypercholesterolemia    Hypertension    Obesity (BMI 30-39.9) 04/26/2018   PTSD (post-traumatic stress disorder)    Sleep apnea    CPAP   SVT (supraventricular tachycardia) ([place])    a. h/o SVT - Ablation done 2013.    Past Surgical History:  Procedure Laterality Date   AORTIC VALVE REPLACEMENT N/A 10/04/2020   Procedure: AORTIC VALVE REPLACEMENT (AVR);  Surgeon: Gaye Pollack, MD;  Location: [place];  Service: Open Heart Surgery;  Laterality: N/A;   CARDIAC ELECTROPHYSIOLOGY STUDY AND ABLATION  08/21/2011   COLONOSCOPY WITH  PROPOFOL N/A 06/26/2017   Procedure: COLONOSCOPY WITH PROPOFOL;  Surgeon: Manya Silvas, MD;  Location[facility] ENDOSCOPY;  Service: Endoscopy;  Laterality: N/A;   ESOPHAGOGASTRODUODENOSCOPY (EGD) WITH PROPOFOL N/A 06/26/2017   Procedure: ESOPHAGOGASTRODUODENOSCOPY (EGD) WITH PROPOFOL;  Surgeon: Manya Silvas, MD;  Location[facility]COPY;  Service: Endoscopy;  Laterality: N/A;   MAZE N/A 10/04/2020   Procedure: MAZE;  Surgeon: Gaye Pollack, MD;  Location: [place];  Service: Open Heart Surgery;  Laterality: N/A;   RIGHT/LEFT HEART CATH AND CORONARY ANGIOGRAPHY N/A 11/16/2019   Procedure: RIGHT/LEFT HEART CATH AND CORONARY ANGIOGRAPHY;  Surgeon: [name], MD;  Location: [place] CV LAB;  Service: Cardiovascular;  Laterality: N/A;   SEPTOPLASTY N/A 03/22/2015   Procedure: SEPTOPLASTY  WITH RIGHT INFERIOR TURBINATE REDUCTION;  Surgeon: Margaretha Sheffield, MD;  Location: [place];  Service: ENT;  Laterality: N/A;   SUPRAVENTRICULAR TACHYCARDIA ABLATION N/A 08/21/2011   Procedure: SUPRAVENTRICULAR TACHYCARDIA ABLATION;  Surgeon: Evans Lance, MD;  Location: [facility] CATH LAB;  Service: Cardiovascular;  Laterality: N/A;   surgery for non descending testicle     TEE WITHOUT CARDIOVERSION N/A 11/16/2019   Procedure: TRANSESOPHAGEAL ECHOCARDIOGRAM (TEE);  Surgeon: Geralynn Rile, MD;  Location: [place];  Service: Cardiovascular;  Laterality: N/A;   TEE WITHOUT CARDIOVERSION N/A 10/04/2020   Procedure: TRANSESOPHAGEAL ECHOCARDIOGRAM (TEE);  Surgeon: Gaye Pollack, MD;  Location: [place];  Service: Open Heart Surgery;  Laterality: N/A;   TONSILLECTOMY      Family Psychiatric History: Please see initial evaluation for full details. I have reviewed the history. No updates at this time.     Family History:  Family History  Problem Relation Age of Onset   Diabetes Mother    Heart disease Mother    Arthritis Mother    Heart attack Father    Kidney disease Father    Cancer Father     Breast cancer Maternal Grandmother    Cancer Maternal Grandfather    Aortic stenosis Maternal Grandfather     Social History:  Social History   Socioeconomic History   Marital status: Married    Spouse name: Not on file   Number of children: 2   Years of education: Not on file   Highest education level: Not on file  Occupational History   Occupation: truck Education administrator: [employer]  Tobacco Use   Smoking status: Former    Types: Cigarettes    Quit date: 11/04/1988    Years since quitting: 33.2   Smokeless tobacco: Former  Scientific laboratory technician Use: Never used  Substance and Sexual Activity   Alcohol use: Yes  Comment: occasionally   Drug use: Not Currently    Comment: many years ago in highschool   Sexual activity: Not Currently  Other Topics Concern   Not on file  Social History Narrative   Very rare exercise   Social Determinants of Health   Financial Resource Strain: Low Risk  (08/19/2021)   Overall Financial Resource Strain (CARDIA)    Difficulty of Paying Living Expenses: Not hard at all  Food Insecurity: Not on file  Transportation Needs: Not on file  Physical Activity: Not on file  Stress: Not on file  Social Connections: Not on file    Allergies: No Known Allergies  Metabolic Disorder Labs: Lab Results  Component Value Date   HGBA1C 6.2 10/25/2021   MPG 128.37 09/20/2020   No results found for: "PROLACTIN" Lab Results  Component Value Date   CHOL 133 10/25/2021   TRIG 126.0 10/25/2021   HDL 33.90 (L) 10/25/2021   CHOLHDL 4 10/25/2021   VLDL 25.2 10/25/2021   LDLCALC 74 10/25/2021   LDLCALC 74 04/26/2020   Lab Results  Component Value Date   TSH 0.97 10/25/2021   TSH 1.22 06/09/2019    Therapeutic Level Labs: No results found for: "LITHIUM" No results found for: "VALPROATE" No results found for: "CBMZ"  Current Medications: Current Outpatient Medications  Medication Sig Dispense Refill   acetaminophen (TYLENOL) 500 MG  tablet Take 1,000 mg by mouth every 6 (six) hours as needed for moderate pain or headache.     atorvastatin (LIPITOR) 40 MG tablet Take 1 tablet (40 mg total) by mouth daily. 90 tablet 3   buPROPion (WELLBUTRIN XL) 150 MG 24 hr tablet Take 1 tablet (150 mg total) by mouth daily. 90 tablet 1   cetirizine (ZYRTEC) 10 MG tablet Take 10 mg by mouth 2 (two) times daily.      citalopram (CELEXA) 40 MG tablet Take 1 tablet (40 mg total) by mouth every morning. 90 tablet 1   CONTOUR NEXT TEST test strip USE AS DIRECTED TO CHECK BLOOD SUGARS TWICE DAILY 100 strip 1   dapagliflozin propanediol (FARXIGA) 10 MG TABS tablet Take 1 tablet (10 mg total) by mouth daily before breakfast. 30 tablet 3   digoxin (LANOXIN) 0.125 MG tablet Take 1 tablet (0.125 mg total) by mouth daily. 90 tablet 3   ELIQUIS 5 MG TABS tablet TAKE 1 TABLET(5 MG) BY MOUTH TWICE DAILY 180 tablet 1   fluticasone (FLONASE) 50 MCG/ACT nasal spray SHAKE LIQUID AND USE 2 SPRAYS IN EACH NOSTRIL DAILY AS NEEDED FOR ALLERGIES OR RHINITIS 48 g 1   Lancets (ONETOUCH DELICA PLUS [id number]) MISC USE TWICE DAILY AS DIRECTED 100 each 7   metFORMIN (GLUCOPHAGE) 500 MG tablet TAKE 1 TABLET(500 MG) BY MOUTH TWICE DAILY 180 tablet 1   metoprolol succinate (TOPROL XL) 25 MG 24 hr tablet Take 1 tablet (25 mg total) by mouth daily. 90 tablet 3   NON FORMULARY as directed. CPAP     OZEMPIC, 1 MG/DOSE, 4 MG/3ML SOPN INJECT 1 MG INTO SKIN WEEKLY 3 mL 2   QUEtiapine (SEROQUEL) 50 MG tablet Take 1 tablet (50 mg total) by mouth at bedtime. 90 tablet 0   sacubitril-valsartan (ENTRESTO) 24-26 MG Take 1 tablet by mouth 2 (two) times daily. 60 tablet 11   No current facility-administered medications for this visit.     Musculoskeletal: Strength & Muscle Tone:  N/A Gait & Station:  N/A Patient leans: N/A  Psychiatric Specialty Exam: Review of Systems  Psychiatric/Behavioral:  Positive for dysphoric mood and sleep disturbance. Negative for agitation, behavioral  problems, confusion, decreased concentration, hallucinations, self-injury and suicidal ideas. The patient is nervous/anxious. The patient is not hyperactive.   All other systems reviewed and are negative.   There were no vitals taken for this visit.There is no height or weight on file to calculate BMI.  General Appearance: Fairly Groomed  Eye Contact:  Good  Speech:  Clear and Coherent  Volume:  Normal  Mood:   good  Affect:  Appropriate, Congruent, and calm  Thought Process:  Coherent  Orientation:  Full (Time, Place, and Person)  Thought Content: Logical   Suicidal Thoughts:  No  Homicidal Thoughts:  No  Memory:  Immediate;   Good  Judgement:  Good  Insight:  Good  Psychomotor Activity:  Normal  Concentration:  Concentration: Good and Attention Span: Good  Recall:  Good  Fund of Knowledge: Good  Language: Good  Akathisia:  No  Handed:  Right  AIMS (if indicated): not done  Assets:  Communication Skills Desire for Improvement  ADL's:  Intact  Cognition: WNL  Sleep:  Poor   Screenings: GAD-7    Health and safety inspector from 07/27/2020 in [place]  Total GAD-7 Score 13      PHQ2-9    [place] Office Visit from 11/18/2021 in [place] from 04/08/2021 in [place] from 01/28/2021 in [place] from 12/18/2020 in [place] from 07/27/2020 in [place]  PHQ-2 Total Score '4 3 4 4 3  '$ PHQ-9 Total Score '14 6 20 17 12      '$ Flowsheet Row Counselor from 05/14/2021 in [place] from 03/05/2021 in [place] Counselor from 01/28/2021 in [place] No Risk Low Risk Low Risk        Assessment and Plan:  PIERCE BAROCIO is a 56 y.o. 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   1. PTSD (post-traumatic stress disorder) 2. Moderate episode of recurrent major depressive disorder (HCC) There has been overall improvement in depressive symptoms and anxiety since uptitration bupropion.  Psychosocial stressors includes financial strain, and his wife, who is having seizure after COVID.  Will uptitrate quetiapine to optimize treatment for depression and also to target insomnia.  Discussed potential metabolic side effect, drowsiness and EPS.  Will continue bupropion to target depression.  Will continue citalopram to target PTSD and depression.   3. Insomnia, unspecified type Unstable.  He continues to have middle insomnia.  He is seen by his sleep specialist, and no change was made.  Will try uptitration of quetiapine as described above.    Plan   Continue Citalopram 40 mg daily Continue bupropion 300 mg daily Increase quetiapine 75 mg at night - monitor drowsiness (QTc 405 msec 10/2020) (Addendum: He had bradycardia on previous exam. He was previously advised to contact his cardiologist. Called at least 3 times, and left voice message to contact the office if he were to experience any dizziness and advised him to hold this medication change.) Next appointment: 8/25 at 8:30 for 30 mins, video  Past trials of medication: citalopram, lamotrigine, quetiapine, Trazodone,    The patient demonstrates the following risk factors for suicide: Chronic risk factors for suicide include: psychiatric disorder of depression, PTSD. Acute risk factors for suicide include: N/A. Protective factors for this patient include: positive social support, responsibility  to others (children, family), coping skills and hope for the future. Considering these factors, the overall suicide risk at this point appears to be low. Patient is appropriate for outpatient follow up.          Collaboration of Care: Collaboration of Care: Other N/A  Patient/Guardian was advised Release of Information must be obtained prior to any record release in order to collaborate their care with an outside provider. Patient/Guardian was advised if they have not already done so to contact the registration department to sign all necessary forms in order for [place] to release information regarding their care.   Consent: Patient/Guardian gives verbal consent for treatment and assignment of benefits for services provided during this visit. Patient/Guardian expressed understanding and agreed to proceed.    [name], MD 01/31/2022, 10:05 AM

## 2022-01-31 ENCOUNTER — Telehealth (INDEPENDENT_AMBULATORY_CARE_PROVIDER_SITE_OTHER): Payer: 59 | Admitting: Psychiatry

## 2022-01-31 ENCOUNTER — Encounter: Payer: Self-pay | Admitting: Psychiatry

## 2022-01-31 DIAGNOSIS — G4700 Insomnia, unspecified: Secondary | ICD-10-CM

## 2022-01-31 DIAGNOSIS — F331 Major depressive disorder, recurrent, moderate: Secondary | ICD-10-CM

## 2022-01-31 DIAGNOSIS — F4310 Post-traumatic stress disorder, unspecified: Secondary | ICD-10-CM | POA: Diagnosis not present

## 2022-01-31 NOTE — Patient Instructions (Signed)
Continue Citalopram 40 mg daily Continue bupropion 300 mg daily Increase quetiapine 75 mg at night   Next appointment: 8/25 at 8:30

## 2022-02-04 ENCOUNTER — Other Ambulatory Visit: Payer: Self-pay | Admitting: Internal Medicine

## 2022-02-04 ENCOUNTER — Other Ambulatory Visit: Payer: Self-pay | Admitting: Interventional Cardiology

## 2022-02-04 DIAGNOSIS — E785 Hyperlipidemia, unspecified: Secondary | ICD-10-CM

## 2022-02-04 DIAGNOSIS — E1165 Type 2 diabetes mellitus with hyperglycemia: Secondary | ICD-10-CM

## 2022-02-27 NOTE — Progress Notes (Deleted)
BH MD/PA/NP OP Progress Note  02/27/2022 8:09 AM Dalton [name]  MRN:  [id number]  Chief Complaint: No chief complaint on file.  HPI: *** Visit Diagnosis: No diagnosis found.  Past Psychiatric History: Please see initial evaluation for full details. I have reviewed the history. No updates at this time.     Past Medical History:  Past Medical History:  Diagnosis Date   Aortic valve stenosis    a. Bicuspid AV - 2D Echo 06/2014 - mod AS, mild AI, mildly dilated aortic root.   Arthritis    Neck   CAD (coronary artery disease)    Carotid artery disease ([place])    a. Mild by duplex 05/2015 - 1-39% BICA. Repeat due 05/2017.   Depression    Diabetes mellitus without complication ([place])    Dilated aortic root ([place])    a. By echo 06/2014.   Dyspnea    with exercion   Dysrhythmia    Atrial Fibrilation   Environmental allergies    Headache    MIgraines- rare   Heart murmur    Hypercholesterolemia    Hypertension    Obesity (BMI 30-39.9) 04/26/2018   PTSD (post-traumatic stress disorder)    Sleep apnea    CPAP   SVT (supraventricular tachycardia) ([place])    a. h/o SVT - Ablation done 2013.    Past Surgical History:  Procedure Laterality Date   AORTIC VALVE REPLACEMENT N/A 10/04/2020   Procedure: AORTIC VALVE REPLACEMENT (AVR);  Surgeon: Gaye Pollack, MD;  Location: [place];  Service: Open Heart Surgery;  Laterality: N/A;   CARDIAC ELECTROPHYSIOLOGY STUDY AND ABLATION  08/21/2011   COLONOSCOPY WITH PROPOFOL N/A 06/26/2017   Procedure: COLONOSCOPY WITH PROPOFOL;  Surgeon: Manya Silvas, MD;  Location: [facility] ENDOSCOPY;  Service: Endoscopy;  Laterality: N/A;   ESOPHAGOGASTRODUODENOSCOPY (EGD) WITH PROPOFOL N/A 06/26/2017   Procedure: ESOPHAGOGASTRODUODENOSCOPY (EGD) WITH PROPOFOL;  Surgeon: Manya Silvas, MD;  Location: [facility] ENDOSCOPY;  Service: Endoscopy;  Laterality: N/A;   MAZE N/A 10/04/2020   Procedure: MAZE;  Surgeon: Gaye Pollack, MD;  Location: [place];  Service: Open  Heart Surgery;  Laterality: N/A;   RIGHT/LEFT HEART CATH AND CORONARY ANGIOGRAPHY N/A 11/16/2019   Procedure: RIGHT/LEFT HEART CATH AND CORONARY ANGIOGRAPHY;  Surgeon: [name], MD;  Location: [place] CV LAB;  Service: Cardiovascular;  Laterality: N/A;   SEPTOPLASTY N/A 03/22/2015   Procedure: SEPTOPLASTY  WITH RIGHT INFERIOR TURBINATE REDUCTION;  Surgeon: Margaretha Sheffield, MD;  Location: [place];  Service: ENT;  Laterality: N/A;   SUPRAVENTRICULAR TACHYCARDIA ABLATION N/A 08/21/2011   Procedure: SUPRAVENTRICULAR TACHYCARDIA ABLATION;  Surgeon: Evans Lance, MD;  Location: [facility] CATH LAB;  Service: Cardiovascular;  Laterality: N/A;   surgery for non descending testicle     TEE WITHOUT CARDIOVERSION N/A 11/16/2019   Procedure: TRANSESOPHAGEAL ECHOCARDIOGRAM (TEE);  Surgeon: Geralynn Rile, MD;  Location: [place];  Service: Cardiovascular;  Laterality: N/A;   TEE WITHOUT CARDIOVERSION N/A 10/04/2020   Procedure: TRANSESOPHAGEAL ECHOCARDIOGRAM (TEE);  Surgeon: Gaye Pollack, MD;  Location: [place];  Service: Open Heart Surgery;  Laterality: N/A;   TONSILLECTOMY      Family Psychiatric History: Please see initial evaluation for full details. I have reviewed the history. No updates at this time.     Family History:  Family History  Problem Relation Age of Onset   Diabetes Mother    Heart disease Mother    Arthritis Mother    Heart attack Father  Kidney disease Father    Cancer Father    Breast cancer Maternal Grandmother    Cancer Maternal Grandfather    Aortic stenosis Maternal Grandfather     Social History:  Social History   Socioeconomic History   Marital status: Married    Spouse name: Not on file   Number of children: 2   Years of education: Not on file   Highest education level: Not on file  Occupational History   Occupation: truck driver    Employer: [employer]  Tobacco Use   Smoking status: Former    Types: Cigarettes    Quit date:  11/04/1988    Years since quitting: 33.3   Smokeless tobacco: Former  Scientific laboratory technician Use: Never used  Substance and Sexual Activity   Alcohol use: Yes    Comment: occasionally   Drug use: Not Currently    Comment: many years ago in highschool   Sexual activity: Not Currently  Other Topics Concern   Not on file  Social History Narrative   Very rare exercise   Social Determinants of Health   Financial Resource Strain: Low Risk  (08/19/2021)   Overall Financial Resource Strain (CARDIA)    Difficulty of Paying Living Expenses: Not hard at all  Food Insecurity: Not on file  Transportation Needs: Not on file  Physical Activity: Not on file  Stress: Not on file  Social Connections: Not on file    Allergies: No Known Allergies  Metabolic Disorder Labs: Lab Results  Component Value Date   HGBA1C 6.2 10/25/2021   MPG 128.37 09/20/2020   No results found for: "PROLACTIN" Lab Results  Component Value Date   CHOL 133 10/25/2021   TRIG 126.0 10/25/2021   HDL 33.90 (L) 10/25/2021   CHOLHDL 4 10/25/2021   VLDL 25.2 10/25/2021   LDLCALC 74 10/25/2021   LDLCALC 74 04/26/2020   Lab Results  Component Value Date   TSH 0.97 10/25/2021   TSH 1.22 06/09/2019    Therapeutic Level Labs: No results found for: "LITHIUM" No results found for: "VALPROATE" No results found for: "CBMZ"  Current Medications: Current Outpatient Medications  Medication Sig Dispense Refill   acetaminophen (TYLENOL) 500 MG tablet Take 1,000 mg by mouth every 6 (six) hours as needed for moderate pain or headache.     atorvastatin (LIPITOR) 40 MG tablet TAKE 1 TABLET(40 MG) BY MOUTH DAILY 90 tablet 3   buPROPion (WELLBUTRIN XL) 150 MG 24 hr tablet Take 1 tablet (150 mg total) by mouth daily. 90 tablet 1   cetirizine (ZYRTEC) 10 MG tablet Take 10 mg by mouth 2 (two) times daily.      citalopram (CELEXA) 40 MG tablet Take 1 tablet (40 mg total) by mouth every morning. 90 tablet 1   CONTOUR NEXT TEST test  strip USE AS DIRECTED TO CHECK BLOOD SUGARS TWICE DAILY 100 strip 1   dapagliflozin propanediol (FARXIGA) 10 MG TABS tablet Take 1 tablet (10 mg total) by mouth daily before breakfast. 30 tablet 3   digoxin (LANOXIN) 0.125 MG tablet Take 1 tablet (0.125 mg total) by mouth daily. 90 tablet 3   ELIQUIS 5 MG TABS tablet TAKE 1 TABLET(5 MG) BY MOUTH TWICE DAILY 180 tablet 1   fluticasone (FLONASE) 50 MCG/ACT nasal spray SHAKE LIQUID AND USE 2 SPRAYS IN EACH NOSTRIL DAILY AS NEEDED FOR ALLERGIES OR RHINITIS 48 g 1   Lancets (ONETOUCH DELICA PLUS [id number]) MISC USE TWICE DAILY AS DIRECTED 100 each 7  metFORMIN (GLUCOPHAGE) 500 MG tablet TAKE 1 TABLET(500 MG) BY MOUTH TWICE DAILY 180 tablet 1   metoprolol succinate (TOPROL-XL) 25 MG 24 hr tablet TAKE 1 TABLET(25 MG) BY MOUTH DAILY 90 tablet 2   NON FORMULARY as directed. CPAP     OZEMPIC, 1 MG/DOSE, 4 MG/3ML SOPN INJECT 1 MG INTO SKIN WEEKLY 3 mL 2   QUEtiapine (SEROQUEL) 50 MG tablet Take 1 tablet (50 mg total) by mouth at bedtime. 90 tablet 0   sacubitril-valsartan (ENTRESTO) 24-26 MG Take 1 tablet by mouth 2 (two) times daily. 60 tablet 11   No current facility-administered medications for this visit.     Musculoskeletal: Strength & Muscle Tone:  N/A Gait & Station:  N/A Patient leans: N/A  Psychiatric Specialty Exam: Review of Systems  There were no vitals taken for this visit.There is no height or weight on file to calculate BMI.  General Appearance: {Appearance:22683}  Eye Contact:  {BHH EYE CONTACT:22684}  Speech:  Clear and Coherent  Volume:  Normal  Mood:  {BHH MOOD:22306}  Affect:  {Affect (PAA):22687}  Thought Process:  Coherent  Orientation:  Full (Time, Place, and Person)  Thought Content: Logical   Suicidal Thoughts:  {ST/HT (PAA):22692}  Homicidal Thoughts:  {ST/HT (PAA):22692}  Memory:  Immediate;   Good  Judgement:  {Judgement (PAA):22694}  Insight:  {Insight (PAA):22695}  Psychomotor Activity:  Normal   Concentration:  Concentration: Good and Attention Span: Good  Recall:  Good  Fund of Knowledge: Good  Language: Good  Akathisia:  No  Handed:  Right  AIMS (if indicated): not done  Assets:  Communication Skills Desire for Improvement  ADL's:  Intact  Cognition: WNL  Sleep:  {BHH GOOD/FAIR/POOR:22877}   Screenings: GAD-7    Health and safety inspector from 07/27/2020 in [place]  Total GAD-7 Score 13      PHQ2-9    [place] Office Visit from 11/18/2021 in [place] Counselor from 04/08/2021 in [place] from 01/28/2021 in [place] Counselor from 12/18/2020 in [place] from 07/27/2020 in [place]  PHQ-2 Total Score '4 3 4 4 3  '$ PHQ-9 Total Score '14 6 20 17 12      '$ Flowsheet Row Counselor from 05/14/2021 in [place] from 03/05/2021 in [place] Counselor from 01/28/2021 in [place] No Risk Low Risk Low Risk        Assessment and Plan:  Dalton [name] is a 56 y.o. 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    1. PTSD (post-traumatic stress disorder) 2. Moderate episode of recurrent major depressive disorder (HCC) There has been overall improvement in depressive symptoms and anxiety since uptitration bupropion.  Psychosocial stressors includes financial strain, and his wife, who is having seizure after COVID.  Will uptitrate quetiapine to optimize  treatment for depression and also to target insomnia.  Discussed potential metabolic side effect, drowsiness and EPS.  Will continue bupropion to target depression.  Will continue citalopram to target PTSD and depression.    3. Insomnia, unspecified type Unstable.  He continues to have middle insomnia.  He is  seen by his sleep specialist, and no change was made.  Will try uptitration of quetiapine as described above.    Plan   Continue Citalopram 40 mg daily Continue bupropion 300 mg daily Increase quetiapine 75 mg at night - monitor drowsiness (QTc 405 msec 10/2020) (Addendum: He had bradycardia on previous exam. He was previously advised to contact his cardiologist. Called at least 3 times, and left voice message to contact the office if he were to experience any dizziness and advised him to hold this medication change.) Next appointment: 8/25 at 8:30 for 30 mins, video   Past trials of medication: citalopram, lamotrigine, quetiapine, Trazodone,    The patient demonstrates the following risk factors for suicide: Chronic risk factors for suicide include: psychiatric disorder of depression, PTSD. Acute risk factors for suicide include: N/A. Protective factors for this patient include: positive social support, responsibility to others (children, family), coping skills and hope for the future. Considering these factors, the overall suicide risk at this point appears to be low. Patient is appropriate for outpatient follow up.       Collaboration of Care: Collaboration of Care: {BH OP Collaboration of Care:21014065}  Patient/Guardian was advised Release of Information must be obtained prior to any record release in order to collaborate their care with an outside provider. Patient/Guardian was advised if they have not already done so to contact the registration department to sign all necessary forms in order for [place] to release information regarding their care.   Consent: Patient/Guardian gives verbal  consent for treatment and assignment of benefits for services provided during this visit. Patient/Guardian expressed understanding and agreed to proceed.    [name], MD 02/27/2022, 8:09 AM

## 2022-02-28 ENCOUNTER — Telehealth: Payer: Self-pay | Admitting: Psychiatry

## 2022-02-28 ENCOUNTER — Telehealth: Payer: 59 | Admitting: Psychiatry

## 2022-02-28 NOTE — Telephone Encounter (Signed)
Sent link for video visit through Epic. Patient did not sign in. Called the patient for appointment scheduled today. The patient did not answer the phone. Left voice message to contact the office ([phone]).   ?

## 2022-03-11 ENCOUNTER — Telehealth: Payer: Self-pay

## 2022-03-11 ENCOUNTER — Other Ambulatory Visit: Payer: Self-pay

## 2022-03-11 DIAGNOSIS — E669 Obesity, unspecified: Secondary | ICD-10-CM

## 2022-03-11 DIAGNOSIS — E041 Nontoxic single thyroid nodule: Secondary | ICD-10-CM

## 2022-03-11 DIAGNOSIS — Z125 Encounter for screening for malignant neoplasm of prostate: Secondary | ICD-10-CM

## 2022-03-11 DIAGNOSIS — I10 Essential (primary) hypertension: Secondary | ICD-10-CM

## 2022-03-11 DIAGNOSIS — E785 Hyperlipidemia, unspecified: Secondary | ICD-10-CM

## 2022-03-11 NOTE — Progress Notes (Unsigned)
Cardiology Office Note:    Date:  03/12/2022   ID:  Dalton [name], DOB 1966/01/12, MRN [id number]  PCP:  Dalton Pheasant, MD  Cardiologist:  Dalton Grooms, MD   Referring MD: Dalton Pheasant, MD   Chief Complaint  Patient presents with   Atrial Fibrillation   Cardiac Valve Problem   Congestive Heart Failure    History of Present Illness:    Dalton [name] is a 56 y.o. 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     He is doing well.  He denies orthopnea, PND, lightheadedness, dyspnea, lower extremity swelling.  He has not had angina.  No neurological complaints.  No bleeding on anticoagulation therapy.  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  The patient's coronary artery disease is minimal luminal irregularity.  He does not have obstructive coronary disease nor does he have symptoms of angina.  Atrial fibrillation has been present for greater than 10 years.  He did have a Maze procedure performed at the time of surgery with left atrial appendage ligation.  He maintained sinus rhythm for short time after surgery in 2021 but later reverted and we have simply decided to go with rate control.  Past Medical History:  Diagnosis Date   Aortic valve stenosis    a. Bicuspid AV - 2D Echo 06/2014 - mod AS, mild AI, mildly dilated aortic root.   Arthritis    Neck   CAD (coronary artery disease)    Carotid artery disease ([place])    a. Mild by duplex 05/2015 - 1-39% BICA. Repeat due 05/2017.   Depression    Diabetes mellitus  without complication ([place])    Dilated aortic root ([place])    a. By echo 06/2014.   Dyspnea    with exercion   Dysrhythmia    Atrial Fibrilation   Environmental allergies    Headache    MIgraines- rare   Heart murmur    Hypercholesterolemia    Hypertension    Obesity (BMI 30-39.9) 04/26/2018   PTSD (post-traumatic stress disorder)    Sleep apnea    CPAP   SVT (supraventricular tachycardia) ([place])    a. h/o SVT - Ablation done 2013.    Past Surgical History:  Procedure Laterality Date   AORTIC VALVE REPLACEMENT N/A 10/04/2020   Procedure: AORTIC VALVE REPLACEMENT (AVR);  Surgeon: Gaye Pollack, MD;  Location: [place];  Service: Open Heart Surgery;  Laterality: N/A;   CARDIAC ELECTROPHYSIOLOGY STUDY AND ABLATION  08/21/2011   COLONOSCOPY WITH PROPOFOL N/A 06/26/2017   Procedure: COLONOSCOPY WITH PROPOFOL;  Surgeon: Manya Silvas, MD;  Location: [facility] ENDOSCOPY;  Service: Endoscopy;  Laterality: N/A;   ESOPHAGOGASTRODUODENOSCOPY (EGD) WITH PROPOFOL N/A 06/26/2017   Procedure: ESOPHAGOGASTRODUODENOSCOPY (EGD) WITH PROPOFOL;  Surgeon: Manya Silvas, MD;  Location: [facility] ENDOSCOPY;  Service: Endoscopy;  Laterality: N/A;   MAZE N/A 10/04/2020   Procedure: MAZE;  Surgeon: Gaye Pollack, MD;  Location: [place];  Service: Open Heart Surgery;  Laterality: N/A;   RIGHT/LEFT HEART CATH AND CORONARY ANGIOGRAPHY N/A 11/16/2019  Procedure: RIGHT/LEFT HEART CATH AND CORONARY ANGIOGRAPHY;  Surgeon: [name], MD;  Location: [place] CV LAB;  Service: Cardiovascular;  Laterality: N/A;   SEPTOPLASTY N/A 03/22/2015   Procedure: SEPTOPLASTY  WITH RIGHT INFERIOR TURBINATE REDUCTION;  Surgeon: Margaretha Sheffield, MD;  Location: [place];  Service: ENT;  Laterality: N/A;   SUPRAVENTRICULAR TACHYCARDIA ABLATION N/A 08/21/2011   Procedure: SUPRAVENTRICULAR TACHYCARDIA ABLATION;  Surgeon: Evans Lance, MD;  Location: [facility] CATH LAB;  Service: Cardiovascular;  Laterality: N/A;   surgery for non  descending testicle     TEE WITHOUT CARDIOVERSION N/A 11/16/2019   Procedure: TRANSESOPHAGEAL ECHOCARDIOGRAM (TEE);  Surgeon: Geralynn Rile, MD;  Location: [place];  Service: Cardiovascular;  Laterality: N/A;   TEE WITHOUT CARDIOVERSION N/A 10/04/2020   Procedure: TRANSESOPHAGEAL ECHOCARDIOGRAM (TEE);  Surgeon: Gaye Pollack, MD;  Location: [place];  Service: Open Heart Surgery;  Laterality: N/A;   TONSILLECTOMY      Current Medications: Current Meds  Medication Sig   acetaminophen (TYLENOL) 500 MG tablet Take 1,000 mg by mouth every 6 (six) hours as needed for moderate pain or headache.   atorvastatin (LIPITOR) 40 MG tablet TAKE 1 TABLET(40 MG) BY MOUTH DAILY   buPROPion (WELLBUTRIN XL) 150 MG 24 hr tablet Take 1 tablet (150 mg total) by mouth daily.   cetirizine (ZYRTEC) 10 MG tablet Take 10 mg by mouth 2 (two) times daily.    citalopram (CELEXA) 40 MG tablet Take 1 tablet (40 mg total) by mouth every morning.   CONTOUR NEXT TEST test strip USE AS DIRECTED TO CHECK BLOOD SUGARS TWICE DAILY   dapagliflozin propanediol (FARXIGA) 10 MG TABS tablet Take 1 tablet (10 mg total) by mouth daily before breakfast.   digoxin (LANOXIN) 0.125 MG tablet Take 1 tablet (0.125 mg total) by mouth daily.   ELIQUIS 5 MG TABS tablet TAKE 1 TABLET(5 MG) BY MOUTH TWICE DAILY   fluticasone (FLONASE) 50 MCG/ACT nasal spray SHAKE LIQUID AND USE 2 SPRAYS IN EACH NOSTRIL DAILY AS NEEDED FOR ALLERGIES OR RHINITIS   Lancets (ONETOUCH DELICA PLUS EPPIRJ18A) MISC USE TWICE DAILY AS DIRECTED   metFORMIN (GLUCOPHAGE) 500 MG tablet TAKE 1 TABLET(500 MG) BY MOUTH TWICE DAILY   metoprolol succinate (TOPROL-XL) 25 MG 24 hr tablet TAKE 1 TABLET(25 MG) BY MOUTH DAILY   NON FORMULARY as directed. CPAP   OZEMPIC, 1 MG/DOSE, 4 MG/3ML SOPN INJECT 1 MG INTO SKIN WEEKLY   QUEtiapine (SEROQUEL) 50 MG tablet Take 1 tablet (50 mg total) by mouth at bedtime.   sacubitril-valsartan (ENTRESTO) 24-26 MG Take 1 tablet by mouth 2  (two) times daily.     Allergies:   Patient has no known allergies.   Social History   Socioeconomic History   Marital status: Married    Spouse name: Not on file   Number of children: 2   Years of education: Not on file   Highest education level: Not on file  Occupational History   Occupation: truck driver    Employer: [employer]  Tobacco Use   Smoking status: Former    Types: Cigarettes    Quit date: 11/04/1988    Years since quitting: 33.3   Smokeless tobacco: Former  Scientific laboratory technician Use: Never used  Substance and Sexual Activity   Alcohol use: Yes    Comment: occasionally   Drug use: Not Currently    Comment: many years ago in highschool   Sexual activity: Not Currently  Other Topics Concern  Not on file  Social History Narrative   Very rare exercise   Social Determinants of Health   Financial Resource Strain: Low Risk  (08/19/2021)   Overall Financial Resource Strain (CARDIA)    Difficulty of Paying Living Expenses: Not hard at all  Food Insecurity: Not on file  Transportation Needs: Not on file  Physical Activity: Not on file  Stress: Not on file  Social Connections: Not on file     Family History: The patient's family history includes Aortic stenosis in his maternal grandfather; Arthritis in his mother; Breast cancer in his maternal grandmother; Cancer in his father and maternal grandfather; Diabetes in his mother; Heart attack in his father; Heart disease in his mother; Kidney disease in his father.  ROS:   Please see the history of present illness.    No syncope or near syncope.  Diabetes obstructive sleep apnea being treated.  All other systems reviewed and are negative.  EKGs/Labs/Other Studies Reviewed:    The following studies were reviewed today: 2D Doppler echocardiogram Nov 12, 2020: IMPRESSIONS     1. Left ventricular ejection fraction, by estimation, is 40 to 45%. The  left ventricle has mildly decreased function. The left ventricle   demonstrates global hypokinesis. There is mild left ventricular  hypertrophy. Left ventricular diastolic parameters  are consistent with Grade I diastolic dysfunction (impaired relaxation).   2. Right ventricular systolic function is normal. The right ventricular  size is normal.   3. The mitral valve is normal in structure. No evidence of mitral valve  regurgitation. No evidence of mitral stenosis.   4. The aortic valve has been repaired/replaced. Aortic valve  regurgitation is not visualized. No aortic stenosis is present. There is a  25 mm Edwards valve present in the aortic position. Procedure Date:  10/04/20. Echo findings are consistent with normal   structure and function of the aortic valve prosthesis. Aortic valve area,  by VTI measures 1.76 cm. Aortic valve mean gradient measures 11.8 mmHg.  Aortic valve Vmax measures 2.43 m/s.   5. The inferior vena cava is normal in size with greater than 50%  respiratory variability, suggesting right atrial pressure of 3 mmHg.   Comparison(s): No significant change from prior study. Prior images  reviewed side by side.   EKG:  EKG on 11/12/2021 demonstrated atrial fibrillation with controlled rate.  Recent Labs: 10/25/2021: ALT 22; BUN 10; Creatinine, Ser 1.03; Potassium 4.4; Sodium 139; TSH 0.97  Recent Lipid Panel    Component Value Date/Time   CHOL 133 10/25/2021 1222   TRIG 126.0 10/25/2021 1222   HDL 33.90 (L) 10/25/2021 1222   CHOLHDL 4 10/25/2021 1222   VLDL 25.2 10/25/2021 1222   LDLCALC 74 10/25/2021 1222   LDLDIRECT 228.0 05/14/2018 0951    Physical Exam:    VS:  BP 104/68   Pulse 90   Ht '6\' 1"'$  (1.854 m)   Wt 249 lb 9.6 oz (113.2 kg)   SpO2 97%   BMI 32.93 kg/m     Wt Readings from Last 3 Encounters:  03/12/22 249 lb 9.6 oz (113.2 kg)  11/11/21 250 lb (113.4 kg)  10/25/21 247 lb 6.4 oz (112.2 kg)     GEN: Overweight. No acute distress HEENT: Normal NECK: No JVD. LYMPHATICS: No lymphadenopathy CARDIAC: 2/6  systolic murmur.  Irregularly irregular RR no gallop, or edema. VASCULAR:  Normal Pulses. No bruits. RESPIRATORY:  Clear to auscultation without rales, wheezing or rhonchi  ABDOMEN: Soft, non-tender, non-distended, No pulsatile mass,  MUSCULOSKELETAL: No deformity  SKIN: Warm and dry NEUROLOGIC:  Alert and oriented x 3 PSYCHIATRIC:  Normal affect   ASSESSMENT:    1. S/P AVR (aortic valve replacement)   2. Coronary artery disease of native artery of native heart with stable angina pectoris ([place])   3. Chronic combined systolic and diastolic HF (heart failure), NYHA class 2 ([place])   4. Obstructive sleep apnea syndrome   5. Persistent atrial fibrillation ([place])   6. Chronic anticoagulation   7. Essential hypertension   8. Neurocardiogenic pre-syncope    PLAN:    In order of problems listed above:  2/6 systolic murmur.  No significant aortic regurgitation heard.  2D Doppler echocardiogram. Nonobstructive CAD by cath in 2021.  No anginal complaints.  Continue risk factor modification. Most recent ejection fraction 16 months ago revealed EF 45%.  Will repeat echocardiogram. He is compliant with CPAP. We developed atrial fibrillation despite Maze procedure..  We have been treating with rate control. Continue Eliquis therapy.  No bleeding complications. Relatively low blood pressure on heart failure therapy: Toprol-XL, empagliflozin, Entresto, No recurrence  Need to prepare letter for DOT that includes comments concerning coronary artery disease (he has minimal nonobstructive disease) chronic systolic and diastolic heart failure (echocardiogram is being reordered).  He needs EF greater than 40%.  He needs an excess treadmill test which we will do with him on his medication to allow [place] to check his exertional tolerance.  Also need to comment on atrial fibrillation and anticoagulation therapy.   Medication Adjustments/Labs and Tests Ordered: Current medicines are reviewed at length with the  patient today.  Concerns regarding medicines are outlined above.  Orders Placed This Encounter  Procedures   EXERCISE TOLERANCE TEST (ETT)   ECHOCARDIOGRAM COMPLETE   No orders of the defined types were placed in this encounter.   Patient Instructions  Medication Instructions:  Your physician recommends that you continue on your current medications as directed. Please refer to the Current Medication list given to you today.  *If you need a refill on your cardiac medications before your next appointment, please call your pharmacy*  Lab Work: NONE  Testing/Procedures: Your physician has requested that you have an echocardiogram. Echocardiography is a painless test that uses sound waves to create images of your heart. It provides your doctor with information about the size and shape of your heart and how well your heart's chambers and valves are working. This procedure takes approximately one hour. There are no restrictions for this procedure.  Your physician has requested that you have an Exercise Tolerance Test (stress test) performed.  Follow-Up: At [facility], you and your health needs are our priority.  As part of our continuing mission to provide you with exceptional heart care, we have created designated Provider Care Teams.  These Care Teams include your primary Cardiologist (physician) and Advanced Practice Providers (APPs -  Physician Assistants and Nurse Practitioners) who all work together to provide you with the care you need, when you need it.  Your next appointment:   9-12 month(s)  The format for your next appointment:   In Person  Provider:   Sinclair Grooms, MD     Important Information About Sugar         Signed, Dalton Grooms, MD  03/12/2022 9:12 AM    [place]

## 2022-03-11 NOTE — Telephone Encounter (Signed)
I called the patients wife and he is scheduled for a lab and a follow up with the provider.  Sawyer Mentzer,cma

## 2022-03-11 NOTE — Telephone Encounter (Signed)
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  Butch Penny states they are requiring patient to have his A1c checked, so she would like for him to have it done as soon as possible.

## 2022-03-12 ENCOUNTER — Ambulatory Visit: Payer: 59 | Attending: Interventional Cardiology | Admitting: Interventional Cardiology

## 2022-03-12 ENCOUNTER — Encounter: Payer: Self-pay | Admitting: Interventional Cardiology

## 2022-03-12 ENCOUNTER — Other Ambulatory Visit (INDEPENDENT_AMBULATORY_CARE_PROVIDER_SITE_OTHER): Payer: 59

## 2022-03-12 ENCOUNTER — Other Ambulatory Visit: Payer: Self-pay | Admitting: Internal Medicine

## 2022-03-12 VITALS — BP 104/68 | HR 90 | Ht 73.0 in | Wt 249.6 lb

## 2022-03-12 DIAGNOSIS — E119 Type 2 diabetes mellitus without complications: Secondary | ICD-10-CM

## 2022-03-12 DIAGNOSIS — E1159 Type 2 diabetes mellitus with other circulatory complications: Secondary | ICD-10-CM | POA: Diagnosis not present

## 2022-03-12 DIAGNOSIS — E669 Obesity, unspecified: Secondary | ICD-10-CM | POA: Diagnosis not present

## 2022-03-12 DIAGNOSIS — G4733 Obstructive sleep apnea (adult) (pediatric): Secondary | ICD-10-CM | POA: Diagnosis not present

## 2022-03-12 DIAGNOSIS — I152 Hypertension secondary to endocrine disorders: Secondary | ICD-10-CM

## 2022-03-12 DIAGNOSIS — I5042 Chronic combined systolic (congestive) and diastolic (congestive) heart failure: Secondary | ICD-10-CM | POA: Diagnosis not present

## 2022-03-12 DIAGNOSIS — E1169 Type 2 diabetes mellitus with other specified complication: Secondary | ICD-10-CM | POA: Diagnosis not present

## 2022-03-12 DIAGNOSIS — Z7901 Long term (current) use of anticoagulants: Secondary | ICD-10-CM

## 2022-03-12 DIAGNOSIS — I10 Essential (primary) hypertension: Secondary | ICD-10-CM | POA: Diagnosis not present

## 2022-03-12 DIAGNOSIS — Z125 Encounter for screening for malignant neoplasm of prostate: Secondary | ICD-10-CM

## 2022-03-12 DIAGNOSIS — Z952 Presence of prosthetic heart valve: Secondary | ICD-10-CM | POA: Diagnosis not present

## 2022-03-12 DIAGNOSIS — E785 Hyperlipidemia, unspecified: Secondary | ICD-10-CM

## 2022-03-12 DIAGNOSIS — E041 Nontoxic single thyroid nodule: Secondary | ICD-10-CM

## 2022-03-12 DIAGNOSIS — I25118 Atherosclerotic heart disease of native coronary artery with other forms of angina pectoris: Secondary | ICD-10-CM | POA: Diagnosis not present

## 2022-03-12 DIAGNOSIS — I4819 Other persistent atrial fibrillation: Secondary | ICD-10-CM

## 2022-03-12 DIAGNOSIS — R55 Syncope and collapse: Secondary | ICD-10-CM

## 2022-03-12 NOTE — Addendum Note (Signed)
Addended by: Neta Ehlers on: 03/12/2022 02:02 PM   Modules accepted: Orders

## 2022-03-12 NOTE — Progress Notes (Signed)
Order placed for lab 

## 2022-03-12 NOTE — Patient Instructions (Addendum)
Medication Instructions:  Your physician recommends that you continue on your current medications as directed. Please refer to the Current Medication list given to you today.  *If you need a refill on your cardiac medications before your next appointment, please call your pharmacy*  Lab Work: NONE  Testing/Procedures: Your physician has requested that you have an echocardiogram. Echocardiography is a painless test that uses sound waves to create images of your heart. It provides your doctor with information about the size and shape of your heart and how well your heart's chambers and valves are working. This procedure takes approximately one hour. There are no restrictions for this procedure.  Your physician has requested that you have an Exercise Tolerance Test (stress test) performed. **Checkout: please print patient instructions under letters.**  Follow-Up: At [facility], you and your health needs are our priority.  As part of our continuing mission to provide you with exceptional heart care, we have created designated Provider Care Teams.  These Care Teams include your primary Cardiologist (physician) and Advanced Practice Providers (APPs -  Physician Assistants and Nurse Practitioners) who all work together to provide you with the care you need, when you need it.  Your next appointment:   9-12 month(s)  The format for your next appointment:   In Person  Provider:   Sinclair Grooms, MD     Important Information About Sugar

## 2022-03-13 ENCOUNTER — Encounter: Payer: Self-pay | Admitting: Internal Medicine

## 2022-03-13 LAB — BASIC METABOLIC PANEL
BUN: 15 mg/dL (ref 6–23)
CO2: 25 mEq/L (ref 19–32)
Calcium: 9 mg/dL (ref 8.4–10.5)
Chloride: 103 mEq/L (ref 96–112)
Creatinine, Ser: 1.23 mg/dL (ref 0.40–1.50)
GFR: 65.65 mL/min (ref >60.00)
Glucose, Bld: 134 mg/dL — ABNORMAL HIGH (ref 70–99)
Potassium: 4.3 mEq/L (ref 3.5–5.1)
Sodium: 139 mEq/L (ref 135–145)

## 2022-03-13 LAB — CBC WITH DIFFERENTIAL/PLATELET
Basophils Absolute: 0.1 10*3/uL (ref 0.0–0.1)
Basophils Relative: 1.3 % (ref 0.0–3.0)
Eosinophils Absolute: 0.1 10*3/uL (ref 0.0–0.7)
Eosinophils Relative: 1.6 % (ref 0.0–5.0)
HCT: 42 % (ref 39.0–52.0)
Hemoglobin: 14.2 g/dL (ref 13.0–17.0)
Lymphocytes Relative: 24.3 % (ref 12.0–46.0)
Lymphs Abs: 1.5 10*3/uL (ref 0.7–4.0)
MCHC: 33.8 g/dL (ref 30.0–36.0)
MCV: 93.3 fl (ref 78.0–100.0)
Monocytes Absolute: 0.5 10*3/uL (ref 0.1–1.0)
Monocytes Relative: 7.7 % (ref 3.0–12.0)
Neutro Abs: 4 10*3/uL (ref 1.4–7.7)
Neutrophils Relative %: 65.1 % (ref 43.0–77.0)
Platelets: 209 10*3/uL (ref 150.0–400.0)
RBC: 4.5 Mil/uL (ref 4.22–5.81)
RDW: 14 % (ref 11.5–15.5)
WBC: 6.2 10*3/uL (ref 4.0–10.5)

## 2022-03-13 LAB — LIPID PANEL
Cholesterol: 121 mg/dL (ref 0–200)
HDL: 27.2 mg/dL — ABNORMAL LOW (ref >39.00)
NonHDL: 94.04
Total CHOL/HDL Ratio: 4
Triglycerides: 304 mg/dL — ABNORMAL HIGH (ref 0.0–149.0)
VLDL: 60.8 mg/dL — ABNORMAL HIGH (ref 0.0–40.0)

## 2022-03-13 LAB — HEMOGLOBIN A1C: Hgb A1c MFr Bld: 6.7 % — ABNORMAL HIGH (ref 4.6–6.5)

## 2022-03-13 LAB — HEPATIC FUNCTION PANEL
ALT: 21 U/L (ref 0–53)
AST: 15 U/L (ref 0–37)
Albumin: 4.1 g/dL (ref 3.5–5.2)
Alkaline Phosphatase: 73 U/L (ref 39–117)
Bilirubin, Direct: 0.1 mg/dL (ref 0.0–0.3)
Total Bilirubin: 0.6 mg/dL (ref 0.2–1.2)
Total Protein: 6.3 g/dL (ref 6.0–8.3)

## 2022-03-13 LAB — LDL CHOLESTEROL, DIRECT: Direct LDL: 75 mg/dL

## 2022-03-14 NOTE — Telephone Encounter (Signed)
You can move the 11:00 up and see if he can come at 11:00 .  Thanks

## 2022-03-19 ENCOUNTER — Ambulatory Visit (INDEPENDENT_AMBULATORY_CARE_PROVIDER_SITE_OTHER): Payer: 59 | Admitting: Internal Medicine

## 2022-03-19 ENCOUNTER — Encounter: Payer: Self-pay | Admitting: Internal Medicine

## 2022-03-19 VITALS — BP 110/84 | HR 87 | Temp 98.1°F | Ht 73.0 in | Wt 248.8 lb

## 2022-03-19 DIAGNOSIS — E1165 Type 2 diabetes mellitus with hyperglycemia: Secondary | ICD-10-CM | POA: Diagnosis not present

## 2022-03-19 DIAGNOSIS — Z952 Presence of prosthetic heart valve: Secondary | ICD-10-CM

## 2022-03-19 DIAGNOSIS — E1169 Type 2 diabetes mellitus with other specified complication: Secondary | ICD-10-CM | POA: Diagnosis not present

## 2022-03-19 DIAGNOSIS — I10 Essential (primary) hypertension: Secondary | ICD-10-CM | POA: Diagnosis not present

## 2022-03-19 DIAGNOSIS — E785 Hyperlipidemia, unspecified: Secondary | ICD-10-CM | POA: Diagnosis not present

## 2022-03-19 DIAGNOSIS — E669 Obesity, unspecified: Secondary | ICD-10-CM

## 2022-03-19 DIAGNOSIS — I5042 Chronic combined systolic (congestive) and diastolic (congestive) heart failure: Secondary | ICD-10-CM

## 2022-03-19 DIAGNOSIS — E1159 Type 2 diabetes mellitus with other circulatory complications: Secondary | ICD-10-CM

## 2022-03-19 DIAGNOSIS — E041 Nontoxic single thyroid nodule: Secondary | ICD-10-CM

## 2022-03-19 DIAGNOSIS — I4891 Unspecified atrial fibrillation: Secondary | ICD-10-CM

## 2022-03-19 DIAGNOSIS — G4733 Obstructive sleep apnea (adult) (pediatric): Secondary | ICD-10-CM

## 2022-03-19 DIAGNOSIS — F4310 Post-traumatic stress disorder, unspecified: Secondary | ICD-10-CM

## 2022-03-19 DIAGNOSIS — Z86010 Personal history of colonic polyps: Secondary | ICD-10-CM

## 2022-03-19 DIAGNOSIS — I152 Hypertension secondary to endocrine disorders: Secondary | ICD-10-CM

## 2022-03-19 NOTE — Progress Notes (Signed)
Patient ID: Dalton [name], male   DOB: Nov 01, 1965, 56 y.o.   MRN: [id number]   Subjective:    Patient ID: Dalton [name], male    DOB: [birth year], 56 y.o.   MRN: [id number]    Patient here for  Chief Complaint  Patient presents with   Follow-up    Follow up on diabetes   .   HPI Here to follow up regarding his diabetes, sleep apnea, CAD and hypercholesterolemia.  Sees Dr Tamala Julian.  Recently evaluated.  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  03/12/22 - office note reviewed.  Scheduled for f/u ECHO.  Recommended continuing eliquis, toprol, farxiga and entresto.  Compliant with cpap.  States does have issues with mouth breathing. Sees Dr Radford Pax.  Overall breathing stable.  Increased stress.  Discussed.  Seeing psychiatry.  No SI.  No abdominal pain reported.     Past Medical History:  Diagnosis Date   Aortic valve stenosis    a. Bicuspid AV - 2D Echo 06/2014 - mod AS, mild AI, mildly dilated aortic root.   Arthritis    Neck   CAD (coronary artery disease)    Carotid artery disease ([place])    a. Mild by duplex 05/2015 - 1-39% BICA. Repeat due 05/2017.   Depression    Diabetes mellitus without complication ([place])    Dilated aortic root ([place])    a. By echo 06/2014.   Dyspnea    with exercion   Dysrhythmia    Atrial Fibrilation   Environmental allergies    Headache    MIgraines- rare   Heart murmur    Hypercholesterolemia    Hypertension    Obesity (BMI 30-39.9) 04/26/2018   PTSD (post-traumatic stress disorder)    Sleep apnea    CPAP   SVT (supraventricular tachycardia) ([place])    a. h/o SVT - Ablation done 2013.   Past Surgical History:  Procedure Laterality Date   AORTIC VALVE REPLACEMENT N/A 10/04/2020   Procedure: AORTIC VALVE REPLACEMENT (AVR);  Surgeon: Gaye Pollack, MD;  Location: [facility];  Service: Open Heart Surgery;  Laterality: N/A;   CARDIAC ELECTROPHYSIOLOGY STUDY AND ABLATION  08/21/2011   COLONOSCOPY WITH PROPOFOL N/A 06/26/2017   Procedure:  COLONOSCOPY WITH PROPOFOL;  Surgeon: Manya Silvas, MD;  Location: [facility] ENDOSCOPY;  Service: Endoscopy;  Laterality: N/A;   ESOPHAGOGASTRODUODENOSCOPY (EGD) WITH PROPOFOL N/A 06/26/2017   Procedure: ESOPHAGOGASTRODUODENOSCOPY (EGD) WITH PROPOFOL;  Surgeon: Manya Silvas, MD;  Location: [facility] ENDOSCOPY;  Service: Endoscopy;  Laterality: N/A;   MAZE N/A 10/04/2020   Procedure: MAZE;  Surgeon: Gaye Pollack, MD;  Location: [facility];  Service: Open Heart Surgery;  Laterality: N/A;   RIGHT/LEFT HEART CATH AND CORONARY ANGIOGRAPHY N/A 11/16/2019   Procedure: RIGHT/LEFT HEART CATH AND CORONARY ANGIOGRAPHY;  Surgeon: [name], MD;  Location: [facility] CV [facility];  Service: Cardiovascular;  Laterality: N/A;   SEPTOPLASTY N/A 03/22/2015   Procedure: SEPTOPLASTY  WITH RIGHT INFERIOR TURBINATE REDUCTION;  Surgeon: Margaretha Sheffield, MD;  Location: [facility];  Service: ENT;  Laterality: N/A;   SUPRAVENTRICULAR TACHYCARDIA ABLATION N/A 08/21/2011   Procedure: SUPRAVENTRICULAR TACHYCARDIA ABLATION;  Surgeon: Evans Lance, MD;  Location: [facility] CATH LAB;  Service: Cardiovascular;  Laterality: N/A;   surgery for non descending testicle     TEE WITHOUT CARDIOVERSION N/A 11/16/2019   Procedure: TRANSESOPHAGEAL ECHOCARDIOGRAM (TEE);  Surgeon: Geralynn Rile, MD;  Location: [facility];  Service: Cardiovascular;  Laterality: N/A;   TEE WITHOUT CARDIOVERSION  N/A 10/04/2020   Procedure: TRANSESOPHAGEAL ECHOCARDIOGRAM (TEE);  Surgeon: Gaye Pollack, MD;  Location: [facility];  Service: Open Heart Surgery;  Laterality: N/A;   TONSILLECTOMY     Family History  Problem Relation Age of Onset   Diabetes Mother    Heart disease Mother    Arthritis Mother    Heart attack Father    Kidney disease Father    Cancer Father    Breast cancer Maternal Grandmother    Cancer Maternal Grandfather    Aortic stenosis Maternal Grandfather    Social History   Socioeconomic History   Marital status: Married     Spouse name: Not on file   Number of children: 2   Years of education: Not on file   Highest education level: Not on file  Occupational History   Occupation: truck Education administrator: [employer]  Tobacco Use   Smoking status: Former    Types: Cigarettes    Quit date: 11/04/1988    Years since quitting: 33.4   Smokeless tobacco: Former  Scientific laboratory technician Use: Never used  Substance and Sexual Activity   Alcohol use: Yes    Comment: occasionally   Drug use: Not Currently    Comment: many years ago in highschool   Sexual activity: Not Currently  Other Topics Concern   Not on file  Social History Narrative   Very rare exercise   Social Determinants of Health   Financial Resource Strain: Low Risk  (08/19/2021)   Overall Financial Resource Strain (CARDIA)    Difficulty of Paying Living Expenses: Not hard at all  Food Insecurity: Not on file  Transportation Needs: Not on file  Physical Activity: Not on file  Stress: Not on file  Social Connections: Not on file     Review of Systems  Constitutional:  Negative for appetite change and unexpected weight change.  HENT:  Negative for congestion and sinus pressure.   Respiratory:  Negative for cough, chest tightness and shortness of breath.        Breathing overall stable.   Cardiovascular:  Negative for chest pain, palpitations and leg swelling.  Gastrointestinal:  Negative for abdominal pain, diarrhea, nausea and vomiting.  Genitourinary:  Negative for difficulty urinating and dysuria.  Musculoskeletal:  Negative for joint swelling and myalgias.  Skin:  Negative for color change and rash.  Neurological:  Negative for dizziness, light-headedness and headaches.  Psychiatric/Behavioral:  Negative for agitation and dysphoric mood.        Objective:     BP 110/84 (BP Location: Left Arm, Patient Position: Sitting, Cuff Size: Large)   Pulse 87   Temp 98.1 F (36.7 C) (Oral)   Ht 6' 1"  (1.854 m)   Wt 248 lb 12.8 oz (112.9  kg)   SpO2 97%   BMI 32.83 kg/m  Wt Readings from Last 3 Encounters:  03/19/22 248 lb 12.8 oz (112.9 kg)  03/12/22 249 lb 9.6 oz (113.2 kg)  11/11/21 250 lb (113.4 kg)    Physical Exam Constitutional:      General: He is not in acute distress.    Appearance: Normal appearance. He is well-developed.  HENT:     Head: Normocephalic and atraumatic.     Right Ear: External ear normal.     Left Ear: External ear normal.  Eyes:     General: No scleral icterus.       Right eye: No discharge.  Left eye: No discharge.  Cardiovascular:     Rate and Rhythm: Normal rate and regular rhythm.     Comments: 1/6 systolic murmur Pulmonary:     Effort: Pulmonary effort is normal. No respiratory distress.     Breath sounds: Normal breath sounds.  Abdominal:     General: Bowel sounds are normal.     Palpations: Abdomen is soft.     Tenderness: There is no abdominal tenderness.  Musculoskeletal:        General: No swelling or tenderness.     Cervical back: Neck supple. No tenderness.  Lymphadenopathy:     Cervical: No cervical adenopathy.  Skin:    Findings: No erythema or rash.  Neurological:     Mental Status: He is alert.  Psychiatric:        Mood and Affect: Mood normal.        Behavior: Behavior normal.      Outpatient Encounter Medications as of 03/19/2022  Medication Sig   acetaminophen (TYLENOL) 500 MG tablet Take 1,000 mg by mouth every 6 (six) hours as needed for moderate pain or headache.   atorvastatin (LIPITOR) 40 MG tablet TAKE 1 TABLET(40 MG) BY MOUTH DAILY   buPROPion (WELLBUTRIN XL) 150 MG 24 hr tablet Take 1 tablet (150 mg total) by mouth daily.   cetirizine (ZYRTEC) 10 MG tablet Take 10 mg by mouth 2 (two) times daily.    citalopram (CELEXA) 40 MG tablet Take 1 tablet (40 mg total) by mouth every morning.   CONTOUR NEXT TEST test strip USE AS DIRECTED TO CHECK BLOOD SUGARS TWICE DAILY   dapagliflozin propanediol (FARXIGA) 10 MG TABS tablet Take 1 tablet (10 mg  total) by mouth daily before breakfast.   digoxin (LANOXIN) 0.125 MG tablet Take 1 tablet (0.125 mg total) by mouth daily.   ELIQUIS 5 MG TABS tablet TAKE 1 TABLET(5 MG) BY MOUTH TWICE DAILY   fluticasone (FLONASE) 50 MCG/ACT nasal spray SHAKE LIQUID AND USE 2 SPRAYS IN EACH NOSTRIL DAILY AS NEEDED FOR ALLERGIES OR RHINITIS   Lancets (ONETOUCH DELICA PLUS [id number]) MISC USE TWICE DAILY AS DIRECTED   metFORMIN (GLUCOPHAGE) 500 MG tablet TAKE 1 TABLET(500 MG) BY MOUTH TWICE DAILY   metoprolol succinate (TOPROL-XL) 25 MG 24 hr tablet TAKE 1 TABLET(25 MG) BY MOUTH DAILY   NON FORMULARY as directed. CPAP   OZEMPIC, 1 MG/DOSE, 4 MG/3ML SOPN INJECT 1 MG INTO SKIN WEEKLY   sacubitril-valsartan (ENTRESTO) 24-26 MG Take 1 tablet by mouth 2 (two) times daily.   QUEtiapine (SEROQUEL) 50 MG tablet Take 1 tablet (50 mg total) by mouth at bedtime.   No facility-administered encounter medications on file as of 03/19/2022.     Lab Results  Component Value Date   WBC 6.2 03/12/2022   HGB 14.2 03/12/2022   HCT 42.0 03/12/2022   PLT 209.0 03/12/2022   GLUCOSE 134 (H) 03/12/2022   CHOL 121 03/12/2022   TRIG 304.0 (H) 03/12/2022   HDL 27.20 (L) 03/12/2022   LDLDIRECT 75.0 03/12/2022   LDLCALC 74 10/25/2021   ALT 21 03/12/2022   AST 15 03/12/2022   NA 139 03/12/2022   K 4.3 03/12/2022   CL 103 03/12/2022   CREATININE 1.23 03/12/2022   BUN 15 03/12/2022   CO2 25 03/12/2022   TSH 0.97 10/25/2021   PSA 0.94 11/08/2021   INR 1.4 (H) 10/04/2020   HGBA1C 6.7 (H) 03/12/2022   MICROALBUR 0.8 10/25/2021    MR ANGIO HEAD WO CONTRAST  Result Date: 12/16/2020  [facility] [address] [place], [place] [address] [phone] NEUROIMAGING REPORT STUDY DATE: 12/15/2020 PATIENT NAME: Dalton [name] DOB: May 13, 1966 MRN: [id number] EXAM: MR angiogram of the intracranial arteries ORDERING CLINICIAN: Asencion Partridge Dohmeier MD CLINICAL HISTORY: 56 year old man with vertigo and aphasia COMPARISON  FILMS: None TECHNIQUE: MR angiogram of the head was obtained utilizing 3D time of flight sequences from below the vertebrobasilar junction up to the intracranial vasculature without contrast.  Computerized reconstructions were obtained. CONTRAST: None IMAGING SITE: [facility], [place], [place] FINDINGS: Source and reconstructed views were evaluated.  The imaged extracranial and intracranial segments of the internal carotid arteries appear normal. The middle cerebral and anterior cerebral arteries appear normal. In the posterior circulation, the vertebral arteries are co-dominant. No stenosis is noted within the vertebral arteries and the basilar arteries. The posterior cerebral arteries appear normal.  Posterior communicating arteries are noted, larger on the right. No aneurysms were identified.   This is a normal MR angiogram of the intracranial arteries. INTERPRETING PHYSICIAN: Richard A. Felecia Shelling, MD, PhD, FAAN Certified in  Neuroimaging by [place] [employer]   MR BRAIN W WO CONTRAST  Result Date: 12/16/2020  [facility] [address] [place], [place] [address] [phone] NEUROIMAGING REPORT STUDY DATE: 12/15/2020 PATIENT NAME: Dalton [name] DOB: [birth year] MRN: [id number] EXAM: MRI Brain with and without contrast ORDERING CLINICIAN: Asencion Partridge Dohmeier MD CLINICAL HISTORY: 56 year old man with vertigo and aphasia COMPARISON FILMS: None TECHNIQUE:MRI of the brain with and without contrast was obtained utilizing 5 mm axial slices with T1, T2, T2 flair, SWI and diffusion weighted views.  T1 sagittal, T2 coronal and postcontrast views in the axial and coronal plane were obtained. CONTRAST: 20 ml [employer] IMAGING SITE: [facility], [place]. FINDINGS: On sagittal images, the spinal cord is imaged caudally to C3-C4 and is normal in caliber.   The contents of the posterior fossa are of normal size and position.   The pituitary  gland and optic chiasm appear normal.    Brain volume appears normal.   The ventricles are normal in size and without distortion.  There are no abnormal extra-axial collections of fluid.  The falx cerebri is heavily calcified with central T1 hyperintensity consistent with marrow.   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  Multiple cerebral expanded Virchow-Robin spaces are noted.  The cerebellum and brainstem appears normal.   The deep gray matter appears normal.    Diffusion weighted images are normal.  Susceptibility weighted images are normal.   The orbits appear normal.   The VIIth/VIIIth nerve complex appears normal.  The mastoid air cells appear normal.  The paranasal sinuses appear normal.  Flow voids are identified within the major intracerebral arteries.  After the infusion of contrast, a small left cerebellar developmental venous anomaly is noted   This MRI of the brain with and without contrast shows the following: 1.   No acute findings. 2.   Few punctate T2/FLAIR hyperintense foci in the hemispheres consistent with minimal chronic microvascular ischemic change, typical for age.  There were no medium or large vessel strokes. 3.   Incidental note of ossification of the falx cerebri, multiple expanded Virchow-Robin spaces in the cerebral hemispheres, a Tornwaldt cyst and a small left cerebellar  developmental venous anomaly.  These are not expected to be clinically significant. INTERPRETING PHYSICIAN: Richard A. Felecia Shelling, MD, PhD, FAAN Certified in  Neuroimaging by [place] [employer]       Assessment & Plan:   Problem List Items Addressed This Visit     Atrial fibrillation ([place])    On eliquis and metoprolol.  Followed by cardiology.  Stable.       Chronic combined systolic and  diastolic heart failure (HCC)    No evidence of volume overload.  Continue entresto and metoprolol.  Follow.       Essential hypertension - Primary    Blood pressure doing well.  Continue entresto and metoprolol.  Follow pressures.  Follow metabolic panel.       Relevant Orders   Basic Metabolic Panel (BMET)   History of colon polyps    Colonoscopy 06/26/17 - one diminutive polyp in the ascending colon, two diminutive polyps at the recto-sigmoid colon and internal hemorrhoids.  Pathology:   tubular adenomatous polyp and hyperplastic polyps (x2).        Hyperlipidemia    Low cholesterol diet and exercise.  On lipitor.  Follow lipid panel and liver function tests.        Relevant Orders   Lipid panel   Hepatic function panel   Obesity, diabetes, and hypertension syndrome (HCC)    Continue diet, exercise and weight loss.  Follow.       PTSD (post-traumatic stress disorder)    Followed by psychiatry.  Discussed with him today.  No SI.  Assures me he would not hurt himself.  Continue f/u with psychiatry.       S/P AVR    Followed by cardiology/CTS.       Sleep apnea    Previous C PAP download performed by his DME - showed an AHI of 2.9/hr on auto PAP cm H2O with 83% compliance in using more than 4 hours nightly.  The patient has been using his cpap regularly.  Some concern with mouth breathing.  Sees Dr Radford Pax.  Last evaluated 08/2021.  Discussed earlier appt to discuss with her.  Agreeable.       Thyroid nodule    Noted on CT.  Is s/p thyroid biopsy.  Confirm no f/u warranted.       Type 2 diabetes mellitus with hyperglycemia ([place])    On ozempic and farxiga.  Low carb diet and exercise.  Follow met b and a1c.   Lab Results  Component Value Date   HGBA1C 6.7 (H) 03/12/2022       Relevant Orders   Hemoglobin T7S   Basic Metabolic Panel (BMET)     Einar Pheasant, MD

## 2022-03-26 ENCOUNTER — Ambulatory Visit (HOSPITAL_COMMUNITY): Payer: 59 | Attending: Interventional Cardiology

## 2022-03-26 ENCOUNTER — Ambulatory Visit (INDEPENDENT_AMBULATORY_CARE_PROVIDER_SITE_OTHER): Payer: 59

## 2022-03-26 DIAGNOSIS — I5042 Chronic combined systolic (congestive) and diastolic (congestive) heart failure: Secondary | ICD-10-CM | POA: Insufficient documentation

## 2022-03-26 DIAGNOSIS — I25118 Atherosclerotic heart disease of native coronary artery with other forms of angina pectoris: Secondary | ICD-10-CM | POA: Insufficient documentation

## 2022-03-26 LAB — ECHOCARDIOGRAM COMPLETE
AR max vel: 0.95 cm2
AV Area VTI: 1.03 cm2
AV Area mean vel: 0.99 cm2
AV Mean grad: 13 mmHg
AV Peak grad: 26.2 mmHg
Ao pk vel: 2.56 m/s
Area-P 1/2: 2.84 cm2
S' Lateral: 3.7 cm

## 2022-03-26 LAB — EXERCISE TOLERANCE TEST
Estimated workload: 10.1
Exercise duration (min): 9 min
Exercise duration (sec): 0 s
MPHR: 164 {beats}/min
Peak HR: 181 {beats}/min
Percent HR: 110 %
Rest HR: 80 {beats}/min

## 2022-03-26 MED ORDER — PERFLUTREN LIPID MICROSPHERE
1.0000 mL | INTRAVENOUS | Status: AC | PRN
  Administered 2022-03-26: 2 mL via INTRAVENOUS

## 2022-03-27 ENCOUNTER — Encounter: Payer: Self-pay | Admitting: *Deleted

## 2022-03-27 ENCOUNTER — Telehealth: Payer: Self-pay | Admitting: Interventional Cardiology

## 2022-03-27 NOTE — Telephone Encounter (Signed)
Nuala Alpha, LPN  01/05/6377 58:85 AM EDT Back to Top    The patient has been notified of the result and verbalized understanding.  All questions (if any) were answered.     Pt aware that Dr. Tamala Julian wrote his DOT clearance letter and we will send this to him in his mychart account in a few mins.  Pt verbalized understanding and agrees with this plan.  Pt was more than gracious for all the assistance provided.    Michaelyn Barter, RN  03/26/2022  5:00 PM EDT     Left message for patient to call back.   [name], MD  03/26/2022  4:28 PM EDT     Let the patient know the echo reveals the EF is 45% and the exercise tolerance was excellent. Will write the letter to DOT. A copy will be sent to Stacie Glaze, DO

## 2022-03-27 NOTE — Telephone Encounter (Signed)
Patient returning call for echo results. He states if he is already in bed and does not answer to call his wife at: [phone].

## 2022-03-28 ENCOUNTER — Encounter: Payer: Self-pay | Admitting: Podiatry

## 2022-03-28 ENCOUNTER — Ambulatory Visit (INDEPENDENT_AMBULATORY_CARE_PROVIDER_SITE_OTHER): Payer: 59 | Admitting: Podiatry

## 2022-03-28 DIAGNOSIS — L600 Ingrowing nail: Secondary | ICD-10-CM | POA: Diagnosis not present

## 2022-03-28 NOTE — Patient Instructions (Signed)

## 2022-03-29 ENCOUNTER — Encounter: Payer: Self-pay | Admitting: Internal Medicine

## 2022-03-29 ENCOUNTER — Telehealth: Payer: Self-pay | Admitting: Internal Medicine

## 2022-03-29 NOTE — Assessment & Plan Note (Signed)
Followed by cardiology/CTS.

## 2022-03-29 NOTE — Assessment & Plan Note (Signed)
Continue diet, exercise and weight loss.  Follow.

## 2022-03-29 NOTE — Assessment & Plan Note (Signed)
On ozempic and farxiga.  Low carb diet and exercise.  Follow met b and a1c.   Lab Results  Component Value Date   HGBA1C 6.7 (H) 03/12/2022   

## 2022-03-29 NOTE — Assessment & Plan Note (Signed)
Noted on CT.  Is s/p thyroid biopsy.  Confirm no f/u warranted.

## 2022-03-29 NOTE — Assessment & Plan Note (Signed)
Blood pressure doing well.  Continue entresto and metoprolol.  Follow pressures.  Follow metabolic panel.

## 2022-03-29 NOTE — Telephone Encounter (Signed)
Please call Dalton [name].  I had discussed with him regarding f/u with Dr Radford Pax regarding his cpap and mouth breathing, etc.  Is he agreeable for an earlier f/u with Dr Radford Pax.  This is the best way for her to evaluate his concerns and to determine the next best step.

## 2022-03-29 NOTE — Assessment & Plan Note (Signed)
Low cholesterol diet and exercise.  On lipitor.  Follow lipid panel and liver function tests.   

## 2022-03-29 NOTE — Assessment & Plan Note (Signed)
No evidence of volume overload.  Continue entresto and metoprolol.  Follow.

## 2022-03-29 NOTE — Assessment & Plan Note (Signed)
Followed by psychiatry.  Discussed with him today.  No SI.  Assures me he would not hurt himself.  Continue f/u with psychiatry.

## 2022-03-29 NOTE — Assessment & Plan Note (Signed)
Colonoscopy 06/26/17 - one diminutive polyp in the ascending colon, two diminutive polyps at the recto-sigmoid colon and internal hemorrhoids.  Pathology:   tubular adenomatous polyp and hyperplastic polyps (x2).

## 2022-03-29 NOTE — Assessment & Plan Note (Signed)
Previous C PAP download performed by his DME - showed an AHI of 2.9/hr on auto PAP cm H2O with 83% compliance in using more than 4 hours nightly.  The patient has been using his cpap regularly.  Some concern with mouth breathing.  Sees Dr Radford Pax.  Last evaluated 08/2021.  Discussed earlier appt to discuss with her.  Agreeable.

## 2022-03-29 NOTE — Assessment & Plan Note (Signed)
On eliquis and metoprolol.  Followed by cardiology.  Stable.

## 2022-03-31 NOTE — Telephone Encounter (Signed)
LMTCB

## 2022-03-31 NOTE — Progress Notes (Signed)
Subjective:   Patient ID: Dalton [name], male   DOB: 56 y.o.   MRN: [id number]   HPI Patient presents with chronic ingrown toenail of the right big toe states its been sore and making it hard to wear shoe gear comfortably.  Both of them are doing this and states that he has tried to trim them and tried to soak them without relief   ROS      Objective:  Physical Exam  Neurovascular status intact with incurvated medial lateral borders of the hallux bilateral with also long-term damage in general to the nail plate itself     Assessment:  Ingrown toenail deformity with pain deformity of the hallux bilateral     Plan:  H&P reviewed condition explained procedure the patient wants this fixed.  I did allow him to read consent form going over risk and alternative treatments and patient had each hallux anesthetized 60 mg like Marcaine mixture sterile prep was done to each big toe and using sterile instrumentation removed the borders exposed matrix applied phenol 3 applications 30 seconds followed by alcohol lavage sterile dressing gave instructions on soaks and to wear dressings 24 hours take them off earlier if throbbing were to occur.  Encouraged to call questions concerns which may arise

## 2022-04-01 NOTE — Telephone Encounter (Signed)
Lvm for pt to return call in regards to seeing Dr.Turner. See note

## 2022-04-03 NOTE — Telephone Encounter (Signed)
After the third attempt to reach the patient I sent a mychart message for the patient to respond to. Dalton [name],cma

## 2022-04-06 ENCOUNTER — Other Ambulatory Visit: Payer: Self-pay | Admitting: Internal Medicine

## 2022-04-06 DIAGNOSIS — E1165 Type 2 diabetes mellitus with hyperglycemia: Secondary | ICD-10-CM

## 2022-05-05 ENCOUNTER — Telehealth: Payer: Self-pay | Admitting: Psychiatry

## 2022-05-05 NOTE — Telephone Encounter (Signed)
Could you ask him what is the dose he is taking now? Although I previously advised him to increase quetiapine to 75 mg, he would have run out the medication while ago if he were to be taking this dose.

## 2022-05-05 NOTE — Telephone Encounter (Signed)
Patient no showed for last appt due to being called into work, in August. Called to day to reschedule. Appt now 06-06-22 virtual. Will need refill on Seroquel, please advise

## 2022-05-06 ENCOUNTER — Telehealth: Payer: Self-pay | Admitting: Psychiatry

## 2022-05-06 ENCOUNTER — Other Ambulatory Visit: Payer: Self-pay | Admitting: Internal Medicine

## 2022-05-06 NOTE — Telephone Encounter (Signed)
LVM asking patient for a cll back concerning his medication bupropion 150 mg daily -- ask him what is the dose he takes? The instruction was to take 300 mg,

## 2022-05-06 NOTE — Telephone Encounter (Signed)
Received a refill request of bupropion 150 mg daily. Could you ask him what is the dose he takes? The instruction was to take 300 mg, but want to make sure the current dose he takes before ordering it.

## 2022-05-07 NOTE — Telephone Encounter (Signed)
Made two attempts to call patient no answer left voicemail for patient to return call to office

## 2022-05-23 ENCOUNTER — Other Ambulatory Visit: Payer: Self-pay | Admitting: Internal Medicine

## 2022-06-04 NOTE — Progress Notes (Signed)
Virtual Visit via Telephone Note  I connected with Dalton [name] on 06/06/22 at 11:30 AM EST by telephone and verified that I am speaking with the correct person using two identifiers.  Location: Patient: home Provider: office Persons participated in the visit- patient, provider    I discussed the limitations, risks, security and privacy concerns of performing an evaluation and management service by telephone and the availability of in person appointments. I also discussed with the patient that there may be a patient responsible charge related to this service. The patient expressed understanding and agreed to proceed.    I discussed the assessment and treatment plan with the patient. The patient was provided an opportunity to ask questions and all were answered. The patient agreed with the plan and demonstrated an understanding of the instructions.   The patient was advised to call back or seek an in-person evaluation if the symptoms worsen or if the condition fails to improve as anticipated.  I provided 16 minutes of non-face-to-face time during this encounter.   [name], MD    [facility] MD/PA/NP OP Progress Note  06/06/2022 11:57 AM Dalton [name]  MRN:  [id number]  Chief Complaint:  Chief Complaint  Patient presents with   Follow-up   HPI:  This is a follow-up appointment for depression and PTSD.  -He is not seen since July.  He states that he is not doing good.  He has worsening in insomnia since being out of quetiapine for the past few weeks.  He was feeling more relaxed without any side effect when he tried higher dose of quetiapine.  He still has trouble with financial strain.  He goes to work regularly.  He feels depressed.  He denies change in appetite.  He occasionally has SI, thinking that he thinks of ways to disappear.  He wishes that somebody runs him off by a car.  He denies any intent, and has agreed to contact emergency resources if any worsening.  Although he  denies hallucinations, he reports occasional blurry vision.  He agrees to discuss with his PCP and be seen by ophthalmologist.  He believes he has been taking citalopram and bupropion; his wife has been managing it.  He is willing to restart quetiapine at this time.  He also states that he will bring his wife next time so that this Probation officer can talk with her.    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 He was in [place] after 911, no combat experience Support: wife Household: Dalton [name]/his wife Marital status: married with his wife of six years Number of children: 2 daughters, age 62, 77, grandchild, 66 in march,   Visit Diagnosis:    ICD-10-CM   1. PTSD (post-traumatic stress disorder)  F43.10     2. Moderate episode of recurrent major depressive disorder (HCC)  F33.1     3. Insomnia, unspecified type  G47.00       Past Psychiatric History: Please see initial evaluation for full details. I have reviewed the history. No updates at this time.     Past Medical History:  Past Medical History:  Diagnosis Date   Aortic valve stenosis    a. Bicuspid AV - 2D Echo 06/2014 - mod AS, mild AI, mildly dilated aortic root.   Arthritis    Neck   CAD (coronary artery disease)    Carotid artery disease ([place])  a. Mild by duplex 05/2015 - 1-39% BICA. Repeat due 05/2017.   Depression    Diabetes mellitus without complication ([place])    Dilated aortic root ([place])    a. By echo 06/2014.   Dyspnea    with exercion   Dysrhythmia    Atrial Fibrilation   Environmental allergies    Headache    MIgraines- rare   Heart murmur    Hypercholesterolemia    Hypertension    Obesity (BMI 30-39.9) 04/26/2018   PTSD (post-traumatic stress disorder)    Sleep apnea    CPAP   SVT (supraventricular tachycardia) ([place])    a. h/o SVT - Ablation done 2013.    Past Surgical History:  Procedure Laterality Date    AORTIC VALVE REPLACEMENT N/A 10/04/2020   Procedure: AORTIC VALVE REPLACEMENT (AVR);  Surgeon: Gaye Pollack, MD;  Location: [facility];  Service: Open Heart Surgery;  Laterality: N/A;   CARDIAC ELECTROPHYSIOLOGY STUDY AND ABLATION  08/21/2011   COLONOSCOPY WITH PROPOFOL N/A 06/26/2017   Procedure: COLONOSCOPY WITH PROPOFOL;  Surgeon: Manya Silvas, MD;  Location[facility] ENDOSCOPY;  Service: Endoscopy;  Laterality: N/A;   ESOPHAGOGASTRODUODENOSCOPY (EGD) WITH PROPOFOL N/A 06/26/2017   Procedure: ESOPHAGOGASTRODUODENOSCOPY (EGD) WITH PROPOFOL;  Surgeon: Manya Silvas, MD;  Location: [facility] ENDOSCOPY;  Service: Endoscopy;  Laterality: N/A;   MAZE N/A 10/04/2020   Procedure: MAZE;  Surgeon: Gaye Pollack, MD;  Location: [facility];  Service: Open Heart Surgery;  Laterality: N/A;   RIGHT/LEFT HEART CATH AND CORONARY ANGIOGRAPHY N/A 11/16/2019   Procedure: RIGHT/LEFT HEART CATH AND CORONARY ANGIOGRAPHY;  Surgeon: [name], MD;  Location: [facility] CV LAB;  Service: Cardiovascular;  Laterality: N/A;   SEPTOPLASTY N/A 03/22/2015   Procedure: SEPTOPLASTY  WITH RIGHT INFERIOR TURBINATE REDUCTION;  Surgeon: Margaretha Sheffield, MD;  Location: [facility] [facility];  Service: ENT;  Laterality: N/A;   SUPRAVENTRICULAR TACHYCARDIA ABLATION N/A 08/21/2011   Procedure: SUPRAVENTRICULAR TACHYCARDIA ABLATION;  Surgeon: Evans Lance, MD;  Location: [facility] CATH LAB;  Service: Cardiovascular;  Laterality: N/A;   surgery for non descending testicle     TEE WITHOUT CARDIOVERSION N/A 11/16/2019   Procedure: TRANSESOPHAGEAL ECHOCARDIOGRAM (TEE);  Surgeon: Geralynn Rile, MD;  Location: [facility];  Service: Cardiovascular;  Laterality: N/A;   TEE WITHOUT CARDIOVERSION N/A 10/04/2020   Procedure: TRANSESOPHAGEAL ECHOCARDIOGRAM (TEE);  Surgeon: Gaye Pollack, MD;  Location: [facility];  Service: Open Heart Surgery;  Laterality: N/A;   TONSILLECTOMY      Family Psychiatric History: Please see initial evaluation for full  details. I have reviewed the history. No updates at this time.     Family History:  Family History  Problem Relation Age of Onset   Diabetes Mother    Heart disease Mother    Arthritis Mother    Heart attack Father    Kidney disease Father    Cancer Father    Breast cancer Maternal Grandmother    Cancer Maternal Grandfather    Aortic stenosis Maternal Grandfather     Social History:  Social History   Socioeconomic History   Marital status: Married    Spouse name: Not on file   Number of children: 2   Years of education: Not on file   Highest education level: Not on file  Occupational History   Occupation: truck driver    Employer: [employer]  Tobacco Use   Smoking status: Former    Types: Cigarettes    Quit date: 11/04/1988  Years since quitting: 33.6   Smokeless tobacco: Former  Scientific laboratory technician Use: Never used  Substance and Sexual Activity   Alcohol use: Yes    Comment: occasionally   Drug use: Not Currently    Comment: many years ago in highschool   Sexual activity: Not Currently  Other Topics Concern   Not on file  Social History Narrative   Very rare exercise   Social Determinants of Health   Financial Resource Strain: Low Risk  (08/19/2021)   Overall Financial Resource Strain (CARDIA)    Difficulty of Paying Living Expenses: Not hard at all  Food Insecurity: Not on file  Transportation Needs: Not on file  Physical Activity: Not on file  Stress: Not on file  Social Connections: Not on file    Allergies: No Known Allergies  Metabolic Disorder Labs: Lab Results  Component Value Date   HGBA1C 6.7 (H) 03/12/2022   MPG 128.37 09/20/2020   No results found for: "PROLACTIN" Lab Results  Component Value Date   CHOL 121 03/12/2022   TRIG 304.0 (H) 03/12/2022   HDL 27.20 (L) 03/12/2022   CHOLHDL 4 03/12/2022   VLDL 60.8 (H) 03/12/2022   LDLCALC 74 10/25/2021   LDLCALC 74 04/26/2020   Lab Results  Component Value Date   TSH 0.97  10/25/2021   TSH 1.22 06/09/2019    Therapeutic Level Labs: No results found for: "LITHIUM" No results found for: "VALPROATE" No results found for: "CBMZ"  Current Medications: Current Outpatient Medications  Medication Sig Dispense Refill   buPROPion (WELLBUTRIN XL) 300 MG 24 hr tablet Take 1 tablet (300 mg total) by mouth daily. 90 tablet 0   acetaminophen (TYLENOL) 500 MG tablet Take 1,000 mg by mouth every 6 (six) hours as needed for moderate pain or headache.     atorvastatin (LIPITOR) 40 MG tablet TAKE 1 TABLET(40 MG) BY MOUTH DAILY 90 tablet 3   cetirizine (ZYRTEC) 10 MG tablet Take 10 mg by mouth 2 (two) times daily.      [START ON 06/14/2022] citalopram (CELEXA) 40 MG tablet Take 1 tablet (40 mg total) by mouth every morning. 90 tablet 1   CONTOUR NEXT TEST test strip USE AS DIRECTED TO CHECK BLOOD SUGARS TWICE DAILY 100 strip 1   dapagliflozin propanediol (FARXIGA) 10 MG TABS tablet TAKE 1 TABLET(10 MG) BY MOUTH DAILY 90 tablet 1   digoxin (LANOXIN) 0.125 MG tablet Take 1 tablet (0.125 mg total) by mouth daily. 90 tablet 3   ELIQUIS 5 MG TABS tablet TAKE 1 TABLET(5 MG) BY MOUTH TWICE DAILY 180 tablet 1   fluticasone (FLONASE) 50 MCG/ACT nasal spray SHAKE LIQUID AND USE 2 SPRAYS IN EACH NOSTRIL DAILY AS NEEDED FOR ALLERGIES OR RHINITIS 48 g 1   Lancets (ONETOUCH DELICA PLUS [id number]) MISC USE TWICE DAILY AS DIRECTED 100 each 7   metFORMIN (GLUCOPHAGE) 500 MG tablet TAKE 1 TABLET(500 MG) BY MOUTH TWICE DAILY 180 tablet 1   metoprolol succinate (TOPROL-XL) 25 MG 24 hr tablet TAKE 1 TABLET(25 MG) BY MOUTH DAILY 90 tablet 2   NON FORMULARY as directed. CPAP     OZEMPIC, 1 MG/DOSE, 4 MG/3ML SOPN INJECT 1 MG INTO THE SKIN EVERY WEEK 3 mL 2   QUEtiapine (SEROQUEL) 50 MG tablet Take 1.5 tablets (75 mg total) by mouth at bedtime. 135 tablet 0   sacubitril-valsartan (ENTRESTO) 24-26 MG Take 1 tablet by mouth 2 (two) times daily. 60 tablet 11   No current facility-administered medications  for this visit.     Musculoskeletal: Strength & Muscle Tone:  N/A Gait & Station:  N/A Patient leans: N/A  Psychiatric Specialty Exam: Review of Systems  Psychiatric/Behavioral:  Positive for dysphoric mood and sleep disturbance. Negative for agitation, behavioral problems, confusion, decreased concentration, hallucinations, self-injury and suicidal ideas. The patient is nervous/anxious. The patient is not hyperactive.   All other systems reviewed and are negative.   There were no vitals taken for this visit.There is no height or weight on file to calculate BMI.  General Appearance: NA  Eye Contact:  NA  Speech:  Clear and Coherent  Volume:  Normal  Mood:   not good  Affect:  NA  Thought Process:  Coherent  Orientation:  Full (Time, Place, and Person)  Thought Content: Logical   Suicidal Thoughts:  Yes.  without intent/plan  Homicidal Thoughts:  No  Memory:  Immediate;   Good  Judgement:  Good  Insight:  Good  Psychomotor Activity:  Normal  Concentration:  Concentration: Good and Attention Span: Good  Recall:  Good  Fund of Knowledge: Good  Language: Good  Akathisia:  No  Handed:  Right  AIMS (if indicated): not done  Assets:  Communication Skills Desire for Improvement  ADL's:  Intact  Cognition: WNL  Sleep:  Poor   Screenings: GAD-7    Health and safety inspector from 07/27/2020 in [place]  Total GAD-7 Score 13      PHQ2-9    [place] Office Visit from 03/19/2022 in [facility] Office Visit from 11/18/2021 in [facility] from 04/08/2021 in [place] from 01/28/2021 in [facility] from 12/18/2020 in [facility]  PHQ-2 Total Score '5 4 3 4 4  '$ PHQ-9 Total Score '18 14 6 20 17      '$ Flowsheet Row Counselor from 05/14/2021 in [facility] from 03/05/2021 in [facility] Counselor from 01/28/2021 in [facility] No Risk Low Risk Low Risk        Assessment and Plan:  BERTON BUTRICK is a 56 y.o. 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   1. PTSD (post-traumatic stress disorder) 2. Moderate episode of recurrent major depressive disorder (HCC) There has been worsening in depressive symptoms in the context of running out of quetiapine/non adherent to appointment.  Psychosocial stressors includes financial strain, and his wife, who is having seizure after COVID.  Will restart quetiapine as adjunctive treatment for depression and also to target insomnia.  Discussed again regarding metabolic side effect, drowsiness and EPS.  Will continue citalopram and bupropion to target depression.   3. Insomnia, unspecified type Worsening since running out of quetiapine.  Will restart quetiapine as described above.     Plan   Continue Citalopram 40 mg daily Continue bupropion 300 mg daily Restart quetiapine 75 mg at night - monitor drowsiness (Afib, QTc 452 msec 11/2021)  Next appointment: 1/15 at 9 AM, in person   Past trials of medication: citalopram, lamotrigine, quetiapine, Trazodone,   I have reviewed suicide assessment in detail. No change in the following assessment.     The patient  demonstrates the following risk factors for suicide: Chronic risk factors for suicide include: psychiatric disorder of depression, PTSD. Acute risk factors for suicide include: N/A. Protective factors for this patient include: positive social support, responsibility to  others (children, family), coping skills and hope for the future. He is future oriented, and is amenable to treatment plans. Considering these factors, the overall suicide risk at this point appears to be moderate, but not at imminent risk. Patient is appropriate for outpatient follow up. Although he has guns at home, those are locked, and only his wife has access to it.  Although he has access to guns, his wife has keys.     Collaboration of Care: Collaboration of Care: Other reviewed notes in Epic  Patient/Guardian was advised Release of Information must be obtained prior to any record release in order to collaborate their care with an outside provider. Patient/Guardian was advised if they have not already done so to contact the registration department to sign all necessary forms in order for [place] to release information regarding their care.   Consent: Patient/Guardian gives verbal consent for treatment and assignment of benefits for services provided during this visit. Patient/Guardian expressed understanding and agreed to proceed.    [name], MD 06/06/2022, 11:57 AM

## 2022-06-06 ENCOUNTER — Telehealth (INDEPENDENT_AMBULATORY_CARE_PROVIDER_SITE_OTHER): Payer: 59 | Admitting: Psychiatry

## 2022-06-06 ENCOUNTER — Encounter: Payer: Self-pay | Admitting: Psychiatry

## 2022-06-06 DIAGNOSIS — F331 Major depressive disorder, recurrent, moderate: Secondary | ICD-10-CM | POA: Diagnosis not present

## 2022-06-06 DIAGNOSIS — G4700 Insomnia, unspecified: Secondary | ICD-10-CM | POA: Diagnosis not present

## 2022-06-06 DIAGNOSIS — F4310 Post-traumatic stress disorder, unspecified: Secondary | ICD-10-CM

## 2022-06-06 MED ORDER — BUPROPION HCL ER (XL) 300 MG PO TB24: 300.0000 mg | ORAL_TABLET | Freq: Every day | ORAL | 0 refills | Status: DC

## 2022-06-06 MED ORDER — CITALOPRAM HYDROBROMIDE 40 MG PO TABS: 40.0000 mg | ORAL_TABLET | Freq: Every morning | ORAL | 1 refills | Status: DC

## 2022-06-06 MED ORDER — QUETIAPINE FUMARATE 50 MG PO TABS: 75.0000 mg | ORAL_TABLET | Freq: Every day | ORAL | 0 refills | Status: DC

## 2022-06-06 NOTE — Patient Instructions (Signed)
Continue Citalopram 40 mg daily Continue bupropion 300 mg daily Restart quetiapine 75 mg at night  Next appointment: 1/15 at 9 AM

## 2022-06-09 ENCOUNTER — Other Ambulatory Visit: Payer: Self-pay

## 2022-06-09 MED ORDER — DIGOXIN 125 MCG PO TABS: 0.1250 mg | ORAL_TABLET | Freq: Every day | ORAL | 3 refills | Status: DC

## 2022-06-16 ENCOUNTER — Other Ambulatory Visit: Payer: 59

## 2022-06-19 ENCOUNTER — Encounter: Payer: 59 | Admitting: Internal Medicine

## 2022-07-17 ENCOUNTER — Other Ambulatory Visit: Payer: Self-pay | Admitting: Interventional Cardiology

## 2022-07-17 DIAGNOSIS — I4891 Unspecified atrial fibrillation: Secondary | ICD-10-CM

## 2022-07-17 NOTE — Telephone Encounter (Signed)
Eliquis '5mg'$  refill request received. Patient is 57 years old, weight-112.9kg, Crea-1.23 on 03/12/22, Diagnosis-Afib, and last seen by Dr. Tamala Julian on 03/12/2022. Dose is appropriate based on dosing criteria. Will send in refill to requested pharmacy.

## 2022-07-19 NOTE — Progress Notes (Signed)
Dalton [name]  07/21/2022 9:36 AM Dalton [name]  MRN:  [id number]  Chief Complaint:  Chief Complaint  Patient presents with   Follow-up   HPI:  This is a follow-up appointment for depression and PTSD.  He states that he has been feeling irritable.  He just wants to be by himself.  Although his wife (sitting next to him) only listens to him, he is irritable.  He also states that the work is not fun anymore.  He has been working there for almost 30 years.  He does not feel his voice does not go anywhere.  He also feels irritable on the road.  He is angry that people has no respect, using cell phone. He does not want to have any computer or cell phone.  He states that his 2 daughters moved in.  His oldest daughter was left by her husband, and the youngest one has a grandchild. His wife is unable to drive due to her having seizure.  Although he wants her to stay at home, he agrees that he feels stuck. The patient has mood symptoms as in PHQ-9/GAD-7.  He has passive SI.  He denies any plan or intent, and has agreed to contact emergency resources if any worsening.  Although he is in the bed for more than ten hours, he feels fatigue.   His wife presents to the visit.  She agrees that he has been irritable, which is not like him. He tends to yell while driving. She takes care of his medication.  She is aware of his SI.  He appears to be fighting in the sleep.  He tends to eat junk food. No safety concern is reported. She agrees with the plan as below.    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 He was in [place] after 911, no combat experience Support: wife Household: [name]/his wife Marital status: married with his wife of six years Number of children: 2 daughters, age 13, 57, grandchild, 3 in march,   [place] Readings from Last 3 Encounters:  07/21/22 253 lb (114.8 kg)  03/19/22 248  lb 12.8 oz (112.9 kg)  03/12/22 249 lb 9.6 oz (113.2 kg)     Visit Diagnosis:    ICD-10-CM   1. PTSD (post-traumatic stress disorder)  F43.10     2. Moderate episode of recurrent major depressive disorder (HCC)  F33.1     3. Insomnia, unspecified type  G47.00       Past Psychiatric History: Please see initial evaluation for full details. I have reviewed the history. No updates at this time.     Past Medical History:  Past Medical History:  Diagnosis Date   Aortic valve stenosis    a. Bicuspid AV - 2D Echo 06/2014 - mod AS, mild AI, mildly dilated aortic root.   Arthritis    Neck   CAD (coronary artery disease)    Carotid artery disease ([place])    a. Mild by duplex 05/2015 - 1-39% BICA. Repeat due 05/2017.   Depression    Diabetes mellitus without complication ([place])    Dilated aortic root ([place])    a. By echo 06/2014.   Dyspnea    with exercion   Dysrhythmia    Atrial Fibrilation   Environmental allergies    Headache    MIgraines- rare   Heart murmur  Hypercholesterolemia    Hypertension    Obesity (BMI 30-39.9) 04/26/2018   PTSD (post-traumatic stress disorder)    Sleep apnea    CPAP   SVT (supraventricular tachycardia)    a. h/o SVT - Ablation done 2013.    Past Surgical History:  Procedure Laterality Date   AORTIC VALVE REPLACEMENT N/A 10/04/2020   Procedure: AORTIC VALVE REPLACEMENT (AVR);  Surgeon: Gaye Pollack, MD;  Location: [place];  Service: Open Heart Surgery;  Laterality: N/A;   CARDIAC ELECTROPHYSIOLOGY STUDY AND ABLATION  08/21/2011   COLONOSCOPY WITH PROPOFOL N/A 06/26/2017   Procedure: COLONOSCOPY WITH PROPOFOL;  Surgeon: Manya Silvas, MD;  Location: [facility] ENDOSCOPY;  Service: Endoscopy;  Laterality: N/A;   ESOPHAGOGASTRODUODENOSCOPY (EGD) WITH PROPOFOL N/A 06/26/2017   Procedure: ESOPHAGOGASTRODUODENOSCOPY (EGD) WITH PROPOFOL;  Surgeon: Manya Silvas, MD;  Location: [facility] ENDOSCOPY;  Service: Endoscopy;  Laterality: N/A;   MAZE N/A  10/04/2020   Procedure: MAZE;  Surgeon: Gaye Pollack, MD;  Location: [place];  Service: Open Heart Surgery;  Laterality: N/A;   RIGHT/LEFT HEART CATH AND CORONARY ANGIOGRAPHY N/A 11/16/2019   Procedure: RIGHT/LEFT HEART CATH AND CORONARY ANGIOGRAPHY;  Surgeon: [name], MD;  Location: [place] CV LAB;  Service: Cardiovascular;  Laterality: N/A;   SEPTOPLASTY N/A 03/22/2015   Procedure: SEPTOPLASTY  WITH RIGHT INFERIOR TURBINATE REDUCTION;  Surgeon: Margaretha Sheffield, MD;  Location: [place];  Service: ENT;  Laterality: N/A;   SUPRAVENTRICULAR TACHYCARDIA ABLATION N/A 08/21/2011   Procedure: SUPRAVENTRICULAR TACHYCARDIA ABLATION;  Surgeon: Evans Lance, MD;  Location[facility] CATH LAB;  Service: Cardiovascular;  Laterality: N/A;   surgery for non descending testicle     TEE WITHOUT CARDIOVERSION N/A 11/16/2019   Procedure: TRANSESOPHAGEAL ECHOCARDIOGRAM (TEE);  Surgeon: Geralynn Rile, MD;  Location: [place];  Service: Cardiovascular;  Laterality: N/A;   TEE WITHOUT CARDIOVERSION N/A 10/04/2020   Procedure: TRANSESOPHAGEAL ECHOCARDIOGRAM (TEE);  Surgeon: Gaye Pollack, MD;  Location: [place];  Service: Open Heart Surgery;  Laterality: N/A;   TONSILLECTOMY      Family Psychiatric History: Please see initial evaluation for full details. I have reviewed the history. No updates at this time.     Family History:  Family History  Problem Relation Age of Onset   Diabetes Mother    Heart disease Mother    Arthritis Mother    Heart attack Father    Kidney disease Father    Cancer Father    Breast cancer Maternal Grandmother    Cancer Maternal Grandfather    Aortic stenosis Maternal Grandfather     Social History:  Social History   Socioeconomic History   Marital status: Married    Spouse name: Not on file   Number of children: 2   Years of education: Not on file   Highest education level: Not on file  Occupational History   Occupation: truck Education administrator:  [employer]  Tobacco Use   Smoking status: Former    Types: Cigarettes    Quit date: 11/04/1988    Years since quitting: 33.7   Smokeless tobacco: Former  Scientific laboratory technician Use: Never used  Substance and Sexual Activity   Alcohol use: Yes    Comment: occasionally   Drug use: Not Currently    Comment: many years ago in highschool   Sexual activity: Not Currently  Other Topics Concern   Not on file  Social History Narrative   Very rare exercise  Social Determinants of Health   Financial Resource Strain: Low Risk  (08/19/2021)   Overall Financial Resource Strain (CARDIA)    Difficulty of Paying Living Expenses: Not hard at all  Food Insecurity: Not on file  Transportation Needs: Not on file  Physical Activity: Not on file  Stress: Not on file  Social Connections: Not on file    Allergies: No Known Allergies  Metabolic Disorder Labs: Lab Results  Component Value Date   HGBA1C 6.7 (H) 03/12/2022   MPG 128.37 09/20/2020   No results found for: "PROLACTIN" Lab Results  Component Value Date   CHOL 121 03/12/2022   TRIG 304.0 (H) 03/12/2022   HDL 27.20 (L) 03/12/2022   CHOLHDL 4 03/12/2022   VLDL 60.8 (H) 03/12/2022   LDLCALC 74 10/25/2021   LDLCALC 74 04/26/2020   Lab Results  Component Value Date   TSH 0.97 10/25/2021   TSH 1.22 06/09/2019    Therapeutic Level Labs: No results found for: "LITHIUM" No results found for: "VALPROATE" No results found for: "CBMZ"  Current Medications: Current Outpatient Medications  Medication Sig Dispense Refill   acetaminophen (TYLENOL) 500 MG tablet Take 1,000 mg by mouth every 6 (six) hours as needed for moderate pain or headache.     atorvastatin (LIPITOR) 40 MG tablet TAKE 1 TABLET(40 MG) BY MOUTH DAILY 90 tablet 3   buPROPion (WELLBUTRIN XL) 300 MG 24 hr tablet Take 1 tablet (300 mg total) by mouth daily. 90 tablet 0   cetirizine (ZYRTEC) 10 MG tablet Take 10 mg by mouth 2 (two) times daily.      citalopram (CELEXA)  40 MG tablet Take 1 tablet (40 mg total) by mouth every morning. 90 tablet 1   CONTOUR NEXT TEST test strip USE AS DIRECTED TO CHECK BLOOD SUGARS TWICE DAILY 100 strip 1   dapagliflozin propanediol (FARXIGA) 10 MG TABS tablet TAKE 1 TABLET(10 MG) BY MOUTH DAILY 90 tablet 1   digoxin (LANOXIN) 0.125 MG tablet Take 1 tablet (0.125 mg total) by mouth daily. 90 tablet 3   ELIQUIS 5 MG TABS tablet TAKE 1 TABLET(5 MG) BY MOUTH TWICE DAILY 180 tablet 1   fluticasone (FLONASE) 50 MCG/ACT nasal spray SHAKE LIQUID AND USE 2 SPRAYS IN EACH NOSTRIL DAILY AS NEEDED FOR ALLERGIES OR RHINITIS 48 g 1   Lancets (ONETOUCH DELICA PLUS QQPYPP50D) MISC USE TWICE DAILY AS DIRECTED 100 each 7   metFORMIN (GLUCOPHAGE) 500 MG tablet TAKE 1 TABLET(500 MG) BY MOUTH TWICE DAILY 180 tablet 1   metoprolol succinate (TOPROL-XL) 25 MG 24 hr tablet TAKE 1 TABLET(25 MG) BY MOUTH DAILY 90 tablet 2   NON FORMULARY as directed. CPAP     OZEMPIC, 1 MG/DOSE, 4 MG/3ML SOPN INJECT 1 MG INTO THE SKIN EVERY WEEK 3 mL 2   QUEtiapine (SEROQUEL) 50 MG tablet Take 1.5 tablets (75 mg total) by mouth at bedtime. 135 tablet 0   sacubitril-valsartan (ENTRESTO) 24-26 MG Take 1 tablet by mouth 2 (two) times daily. 60 tablet 11   No current facility-administered medications for this visit.     Musculoskeletal: Strength & Muscle Tone: within normal limits Gait & Station: normal Patient leans: N/A  Psychiatric Specialty Exam: Review of Systems  Psychiatric/Behavioral:  Positive for dysphoric mood, sleep disturbance and suicidal ideas. Negative for agitation, behavioral problems, confusion, decreased concentration, hallucinations and self-injury. The patient is nervous/anxious. The patient is not hyperactive.   All other systems reviewed and are negative.   Blood pressure 114/88, pulse 73, temperature  97.9 F (36.6 C), temperature source Oral, height '6\' 1"'$  (1.854 m), weight 253 lb (114.8 kg), SpO2 99 %.Body mass index is 33.38 kg/m.  General  Appearance: Fairly Groomed  Eye Contact:  Good  Speech:  Clear and Coherent  Volume:  Normal  Mood:  Angry  Affect:  Appropriate, Congruent, and calm  Thought Process:  Coherent  Orientation:  Full (Time, Place, and Person)  Thought Content: Logical   Suicidal Thoughts:  No  Homicidal Thoughts:  No  Memory:  Immediate;   Good  Judgement:  Good  Insight:  Good  Psychomotor Activity:  Normal  Concentration:  Concentration: Good and Attention Span: Good  Recall:  Good  Fund of Knowledge: Good  Language: Good  Akathisia:  No  Handed:  Right  AIMS (if indicated): not done  Assets:  Communication Skills Desire for Improvement  ADL's:  Intact  Cognition: WNL  Sleep:   hypersomnia   Screenings: GAD-7    Flowsheet Row Office Visit from 07/21/2022 in [facility] from 07/27/2020 in [facility]  Total GAD-7 Score 13 13      PHQ2-9    [place] Visit from 07/21/2022 in [facility] Office Visit from 03/19/2022 in [place] [facility] Office Visit from 11/18/2021 in [place] [facility] from 04/08/2021 in [facility] from 01/28/2021 in [facility]  PHQ-2 Total Score '5 5 4 3 4  '$ PHQ-9 Total Score '19 18 14 6 20      '$ [place] Office Visit from 07/21/2022 in [facility] Counselor from 05/14/2021 in [facility] Counselor from 03/05/2021 in [facility] Error: Q3, 4, or 5 should not be populated when Q2 is No No Risk Low Risk        Assessment and Plan:  ALPHONSUS DOYEL is a 57 y.o. 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   1. PTSD (post-traumatic stress disorder) 2. Moderate episode of recurrent major depressive disorder (HCC) There is worsening in depressive symptoms and anxiety since the last visit.  Psychosocial stressors includes work related stress, financial strain, having responsibility for his family including his 2 daughters and his wife, who has seizure after COVID.  We uptitrate quetiapine to optimize treatment for depression, irritability.  Discussed potential metabolic side effect, drowsiness and EPS.  Will continue citalopram and bupropion to target depression.   3. Insomnia, unspecified type  He reports unrestful hypersomnia. He reportedly uses new CPAP machine.  He agrees to contact the office if any worsening in this after uptitration of quetiapine.     Plan Continue Citalopram 40 mg daily Continue bupropion 300 mg daily Increase quetiapine 100 mg at night - monitor drowsiness (Afib, QTc 452 msec 11/2021)  Next appointment: 2/22 at 1 pM, in person   Past trials of medication: citalopram, lamotrigine, quetiapine, Trazodone,    I have reviewed suicide assessment in detail. No change in the following assessment.    The patient demonstrates the following risk factors for suicide: Chronic risk factors for suicide include: psychiatric disorder of depression, PTSD. Acute risk factors for suicide include: N/A. Protective factors for this  patient include: positive social support, responsibility to others (children, family), coping skills and hope for the future. He is future oriented, and is amenable to treatment plans. Considering these factors, the overall suicide risk at this point appears to be moderate, but not at imminent risk. Patient is appropriate for outpatient follow up. Although he has guns at home, those are locked, and only his wife has  access to it.   Collaboration of Care: Collaboration of Care: Other reviewed notes in Epic  Patient/Guardian was advised Release of Information must be obtained prior to any record release in order to collaborate their care with an outside provider. Patient/Guardian was advised if they have not already done so to contact the registration department to sign all necessary forms in order for [place] to release information regarding their care.   Consent: Patient/Guardian gives verbal consent for treatment and assignment of benefits for services provided during this visit. Patient/Guardian expressed understanding and agreed to proceed.    [name], MD 07/21/2022, 9:36 AM

## 2022-07-21 ENCOUNTER — Ambulatory Visit (INDEPENDENT_AMBULATORY_CARE_PROVIDER_SITE_OTHER): Payer: 59 | Admitting: Psychiatry

## 2022-07-21 ENCOUNTER — Encounter: Payer: Self-pay | Admitting: Psychiatry

## 2022-07-21 VITALS — BP 114/88 | HR 73 | Temp 97.9°F | Ht 73.0 in | Wt 253.0 lb

## 2022-07-21 DIAGNOSIS — F4310 Post-traumatic stress disorder, unspecified: Secondary | ICD-10-CM

## 2022-07-21 DIAGNOSIS — G4700 Insomnia, unspecified: Secondary | ICD-10-CM | POA: Diagnosis not present

## 2022-07-21 DIAGNOSIS — F331 Major depressive disorder, recurrent, moderate: Secondary | ICD-10-CM | POA: Diagnosis not present

## 2022-07-21 NOTE — Patient Instructions (Signed)
Continue Citalopram 40 mg daily Continue bupropion 300 mg daily Increase quetiapine 100 mg at night  Next appointment: 2/22 at 1 pM

## 2022-08-02 ENCOUNTER — Other Ambulatory Visit: Payer: Self-pay | Admitting: Family

## 2022-08-02 DIAGNOSIS — E1165 Type 2 diabetes mellitus with hyperglycemia: Secondary | ICD-10-CM

## 2022-08-25 ENCOUNTER — Other Ambulatory Visit: Payer: Self-pay | Admitting: Family

## 2022-08-25 DIAGNOSIS — E1165 Type 2 diabetes mellitus with hyperglycemia: Secondary | ICD-10-CM

## 2022-08-25 NOTE — Progress Notes (Unsigned)
BH MD/PA/NP OP Progress Note  08/25/2022 3:27 PM Dalton [name]  MRN:  [id number]  Chief Complaint: No chief complaint on file.  HPI: *** Visit Diagnosis: No diagnosis found.  Past Psychiatric History: Please see initial evaluation for full details. I have reviewed the history. No updates at this time.     Past Medical History:  Past Medical History:  Diagnosis Date   Aortic valve stenosis    a. Bicuspid AV - 2D Echo 06/2014 - mod AS, mild AI, mildly dilated aortic root.   Arthritis    Neck   CAD (coronary artery disease)    Carotid artery disease ([place])    a. Mild by duplex 05/2015 - 1-39% BICA. Repeat due 05/2017.   Depression    Diabetes mellitus without complication ([place])    Dilated aortic root ([place])    a. By echo 06/2014.   Dyspnea    with exercion   Dysrhythmia    Atrial Fibrilation   Environmental allergies    Headache    MIgraines- rare   Heart murmur    Hypercholesterolemia    Hypertension    Obesity (BMI 30-39.9) 04/26/2018   PTSD (post-traumatic stress disorder)    Sleep apnea    CPAP   SVT (supraventricular tachycardia)    a. h/o SVT - Ablation done 2013.    Past Surgical History:  Procedure Laterality Date   AORTIC VALVE REPLACEMENT N/A 10/04/2020   Procedure: AORTIC VALVE REPLACEMENT (AVR);  Surgeon: Gaye Pollack, MD;  Location: [facility];  Service: Open Heart Surgery;  Laterality: N/A;   CARDIAC ELECTROPHYSIOLOGY STUDY AND ABLATION  08/21/2011   COLONOSCOPY WITH PROPOFOL N/A 06/26/2017   Procedure: COLONOSCOPY WITH PROPOFOL;  Surgeon: Manya Silvas, MD;  Location: [facility] END[facility]COPY;  Service: Endoscopy;  Laterality: N/A;   ESOPHAGOGASTRODUODENOSCOPY (EGD) WITH PROPOFOL N/A 06/26/2017   Procedure: ESOPHAGOGASTRODUODENOSCOPY (EGD) WITH PROPOFOL;  Surgeon: Manya Silvas, MD;  Location: [facility] ENDOSCOPY;  Service: Endoscopy;  Laterality: N/A;   MAZE N/A 10/04/2020   Procedure: MAZE;  Surgeon: Gaye Pollack, MD;  Location: [facility];  Service: Open Heart  Surgery;  Laterality: N/A;   RIGHT/LEFT HEART CATH AND CORONARY ANGIOGRAPHY N/A 11/16/2019   Procedure: RIGHT/LEFT HEART CATH AND CORONARY ANGIOGRAPHY;  Surgeon: [name], MD;  Location: [place] [facility] CV LAB;  Service: Cardiovascular;  Laterality: N/A;   SEPTOPLASTY N/A 03/22/2015   Procedure: SEPTOPLASTY  WITH RIGHT INFERIOR TURBINATE REDUCTION;  Surgeon: Margaretha Sheffield, MD;  Location: [facility];  Service: ENT;  Laterality: N/A;   SUPRAVENTRICULAR TACHYCARDIA ABLATION N/A 08/21/2011   Procedure: SUPRAVENTRICULAR TACHYCARDIA ABLATION;  Surgeon: Evans Lance, MD;  Location[facility] CATH LAB;  Service: Cardiovascular;  Laterality: N/A;   surgery for non descending testicle     TEE WITHOUT CARDIOVERSION N/A 11/16/2019   Procedure: TRANSESOPHAGEAL ECHOCARDIOGRAM (TEE);  Surgeon: Geralynn Rile, MD;  Location: [facility];  Service: Cardiovascular;  Laterality: N/A;   TEE WITHOUT CARDIOVERSION N/A 10/04/2020   Procedure: TRANSESOPHAGEAL ECHOCARDIOGRAM (TEE);  Surgeon: Gaye Pollack, MD;  Location: [facility];  Service: Open Heart Surgery;  Laterality: N/A;   TONSILLECTOMY      Family Psychiatric History: Please see initial evaluation for full details. I have reviewed the history. No updates at this time.     Family History:  Family History  Problem Relation Age of Onset   Diabetes Mother    Heart disease Mother    Arthritis Mother    Heart attack Father  Kidney disease Father    Cancer Father    Breast cancer Maternal Grandmother    Cancer Maternal Grandfather    Aortic stenosis Maternal Grandfather     Social History:  Social History   Socioeconomic History   Marital status: Married    Spouse name: Not on file   Number of children: 2   Years of education: Not on file   Highest education level: Not on file  Occupational History   Occupation: truck driver    Employer: [employer]  Tobacco Use   Smoking status: Former    Types: Cigarettes    Quit date: 11/04/1988     Years since quitting: 33.8   Smokeless tobacco: Former  Scientific laboratory technician Use: Never used  Substance and Sexual Activity   Alcohol use: Yes    Comment: occasionally   Drug use: Not Currently    Comment: many years ago in highschool   Sexual activity: Not Currently  Other Topics Concern   Not on file  Social History Narrative   Very rare exercise   Social Determinants of Health   Financial Resource Strain: Low Risk  (08/19/2021)   Overall Financial Resource Strain (CARDIA)    Difficulty of Paying Living Expenses: Not hard at all  Food Insecurity: Not on file  Transportation Needs: Not on file  Physical Activity: Not on file  Stress: Not on file  Social Connections: Not on file    Allergies: No Known Allergies  Metabolic Disorder Labs: Lab Results  Component Value Date   HGBA1C 6.7 (H) 03/12/2022   MPG 128.37 09/20/2020   No results found for: "PROLACTIN" Lab Results  Component Value Date   CHOL 121 03/12/2022   TRIG 304.0 (H) 03/12/2022   HDL 27.20 (L) 03/12/2022   CHOLHDL 4 03/12/2022   VLDL 60.8 (H) 03/12/2022   LDLCALC 74 10/25/2021   LDLCALC 74 04/26/2020   Lab Results  Component Value Date   TSH 0.97 10/25/2021   TSH 1.22 06/09/2019    Therapeutic Level Labs: No results found for: "LITHIUM" No results found for: "VALPROATE" No results found for: "CBMZ"  Current Medications: Current Outpatient Medications  Medication Sig Dispense Refill   acetaminophen (TYLENOL) 500 MG tablet Take 1,000 mg by mouth every 6 (six) hours as needed for moderate pain or headache.     atorvastatin (LIPITOR) 40 MG tablet TAKE 1 TABLET(40 MG) BY MOUTH DAILY 90 tablet 3   buPROPion (WELLBUTRIN XL) 300 MG 24 hr tablet Take 1 tablet (300 mg total) by mouth daily. 90 tablet 0   cetirizine (ZYRTEC) 10 MG tablet Take 10 mg by mouth 2 (two) times daily.      citalopram (CELEXA) 40 MG tablet Take 1 tablet (40 mg total) by mouth every morning. 90 tablet 1   CONTOUR NEXT TEST test  strip USE AS DIRECTED TO CHECK BLOOD SUGARS TWICE DAILY 100 strip 1   dapagliflozin propanediol (FARXIGA) 10 MG TABS tablet TAKE 1 TABLET(10 MG) BY MOUTH DAILY 90 tablet 1   digoxin (LANOXIN) 0.125 MG tablet Take 1 tablet (0.125 mg total) by mouth daily. 90 tablet 3   ELIQUIS 5 MG TABS tablet TAKE 1 TABLET(5 MG) BY MOUTH TWICE DAILY 180 tablet 1   fluticasone (FLONASE) 50 MCG/ACT nasal spray SHAKE LIQUID AND USE 2 SPRAYS IN EACH NOSTRIL DAILY AS NEEDED FOR ALLERGIES OR RHINITIS 48 g 1   Lancets (ONETOUCH DELICA PLUS 123XX123) MISC USE TWICE DAILY AS DIRECTED 100 each 7  metFORMIN (GLUCOPHAGE) 500 MG tablet TAKE 1 TABLET(500 MG) BY MOUTH TWICE DAILY 180 tablet 1   metoprolol succinate (TOPROL-XL) 25 MG 24 hr tablet TAKE 1 TABLET(25 MG) BY MOUTH DAILY 90 tablet 2   NON FORMULARY as directed. CPAP     OZEMPIC, 1 MG/DOSE, 4 MG/3ML SOPN INJECT 1 MG INTO THE SKIN EVERY WEEK 3 mL 2   QUEtiapine (SEROQUEL) 50 MG tablet Take 1.5 tablets (75 mg total) by mouth at bedtime. 135 tablet 0   sacubitril-valsartan (ENTRESTO) 24-26 MG Take 1 tablet by mouth 2 (two) times daily. 60 tablet 11   No current facility-administered medications for this visit.     Musculoskeletal: Strength & Muscle Tone: within normal limits Gait & Station: normal Patient leans: N/A  Psychiatric Specialty Exam: Review of Systems  There were no vitals taken for this visit.There is no height or weight on file to calculate BMI.  General Appearance: {Appearance:22683}  Eye Contact:  {BHH EYE CONTACT:22684}  Speech:  Clear and Coherent  Volume:  Normal  Mood:  {BHH MOOD:22306}  Affect:  {Affect (PAA):22687}  Thought Process:  Coherent  Orientation:  Full (Time, Place, and Person)  Thought Content: Logical   Suicidal Thoughts:  {ST/HT (PAA):22692}  Homicidal Thoughts:  {ST/HT (PAA):22692}  Memory:  Immediate;   Good  Judgement:  {Judgement (PAA):22694}  Insight:  {Insight (PAA):22695}  Psychomotor Activity:  Normal   Concentration:  Concentration: Good and Attention Span: Good  Recall:  Good  Fund of Knowledge: Good  Language: Good  Akathisia:  No  Handed:  Right  AIMS (if indicated): not done  Assets:  Communication Skills Desire for Improvement  ADL's:  Intact  Cognition: WNL  Sleep:  {BHH GOOD/FAIR/POOR:22877}   Screenings: GAD-7    Flowsheet Row Office Visit from 07/21/2022 in [facility] Counselor from 07/27/2020 in [facility]  Total GAD-7 Score 13 13      PHQ2-9    [place] Office Visit from 07/21/2022 in [facility] Office Visit from 03/19/2022 in [facility] at [facility] Visit from 11/18/2021 in [place] Counselor from 04/08/2021 in [place] Counselor from 01/28/2021 in [place]  PHQ-2 Total Score 5 5 4 3 4  $ PHQ-9 Total Score 19 18 14 6 20      $ [place] Office Visit from 07/21/2022 in [facility] [place] Counselor from 05/14/2021 in [facility] Counselor from 03/05/2021 in [facility] Error: Q3, 4, or 5 should not be populated when Q2 is No No Risk Low Risk        Assessment and Plan:  MALIKIAH HOOLE is a 57 y.o. 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   Acute stressors include: work related stress  Other stressors include: financial strain, wife suffering  from seizure after COVID    History:     1. PTSD (post-traumatic stress disorder) 2. Moderate episode of recurrent major depressive disorder (HCC) There is worsening in depressive symptoms and anxiety since the last visit.  Psychosocial stressors  includes work related stress, financial strain, having responsibility for his family including his 2 daughters and his wife, who has seizure after COVID.  We uptitrate quetiapine to optimize treatment for depression, irritability.  Discussed potential metabolic side effect, drowsiness and EPS.  Will continue citalopram and bupropion to target depression.    3. Insomnia, unspecified type  He reports unrestful hypersomnia. He reportedly uses new CPAP machine.  He agrees to contact the office if any worsening in this after uptitration of quetiapine.      Plan Continue Citalopram 40 mg daily Continue bupropion 300 mg daily Increase quetiapine 100 mg at night - monitor drowsiness (Afib, QTc 452 msec 11/2021)  Next appointment: 2/22 at 1 pM, in person   Past trials of medication: citalopram, lamotrigine, quetiapine, Trazodone,     I have reviewed suicide assessment in detail. No change in the following assessment.    The patient demonstrates the following risk factors for suicide: Chronic risk factors for suicide include: psychiatric disorder of depression, PTSD. Acute risk factors for suicide include: N/A. Protective factors for this patient include: positive social support, responsibility to others (children, family), coping skills and hope for the future. He is future oriented, and is amenable to treatment plans. Considering these factors, the overall suicide risk at this point appears to be moderate, but not at imminent risk. Patient is appropriate for outpatient follow up. Although he has guns at home, those are locked, and only his wife has access to it.  Collaboration of Care: Collaboration of Care: {BH OP Collaboration of  Care:21014065}  Patient/Guardian was advised Release of Information must be obtained prior to any record release in order to collaborate their care with an outside provider. Patient/Guardian was advised if they have not already done so to contact the registration department to sign all necessary forms in order for [place] to release information regarding their care.   Consent: Patient/Guardian gives verbal consent for treatment and assignment of benefits for services provided during this visit. Patient/Guardian expressed understanding and agreed to proceed.    [name], MD 08/25/2022, 3:27 PM

## 2022-08-28 ENCOUNTER — Encounter: Payer: 59 | Admitting: Psychiatry

## 2022-08-28 ENCOUNTER — Telehealth: Payer: Self-pay | Admitting: Psychiatry

## 2022-08-28 NOTE — Telephone Encounter (Signed)
He states that he cannot do a visit now as he is driving. He acknowledges that this would be considered a no-show. He understands to be available at the next visit. 4/10 at 8 am, video

## 2022-08-28 NOTE — Progress Notes (Signed)
This encounter was created in error - please disregard.

## 2022-08-29 ENCOUNTER — Other Ambulatory Visit: Payer: Self-pay

## 2022-08-29 ENCOUNTER — Encounter: Payer: Self-pay | Admitting: Internal Medicine

## 2022-08-29 DIAGNOSIS — E1165 Type 2 diabetes mellitus with hyperglycemia: Secondary | ICD-10-CM

## 2022-08-29 MED ORDER — METFORMIN HCL 500 MG PO TABS: ORAL_TABLET | ORAL | 1 refills | Status: DC

## 2022-09-05 ENCOUNTER — Other Ambulatory Visit: Payer: Self-pay | Admitting: Psychiatry

## 2022-09-05 ENCOUNTER — Telehealth: Payer: Self-pay

## 2022-09-05 MED ORDER — BUPROPION HCL ER (XL) 300 MG PO TB24: 300.0000 mg | ORAL_TABLET | Freq: Every day | ORAL | 0 refills | Status: DC

## 2022-09-05 NOTE — Telephone Encounter (Signed)
Received fax from [facility] requesting a refill for the following medication please advise Last visit  07/21/22 Next visit 10/15/22   buPROPion (WELLBUTRIN XL) 300 MG 24 hr tablet 90 tablet 0 06/06/2022 09/04/2022   Sig - Route: Take 1 tablet (300 mg total) by mouth daily. - Oral   Sent to pharmacy as: buPROPion (WELLBUTRIN XL) 300 MG 24 hr tablet    Pharmacy [facility] [phone] - [place], [address] AT [place] Phone: [phone]  Fax: [phone]

## 2022-09-05 NOTE — Telephone Encounter (Signed)
Ordered

## 2022-09-11 ENCOUNTER — Encounter: Payer: 59 | Admitting: Internal Medicine

## 2022-09-18 ENCOUNTER — Telehealth: Payer: Self-pay

## 2022-09-18 NOTE — Telephone Encounter (Signed)
Received a fax from the pharmacy requesting a refill for the following medication please advise Last visit 08/28/22 Next visit 10/15/22   QUEtiapine (SEROQUEL) 50 MG tablet     [place] AT [facility] Phone: [phone]  Fax: [phone]

## 2022-09-19 ENCOUNTER — Encounter: Payer: 59 | Admitting: Internal Medicine

## 2022-09-19 NOTE — Telephone Encounter (Signed)
Per review of Dr. Ivor Reining notes quetiapine was increased to 100 mg at night .  Please contact patient to verify this and a new prescription can be sent once I know.

## 2022-09-23 NOTE — Telephone Encounter (Signed)
Will wait to hear from him.

## 2022-09-23 NOTE — Telephone Encounter (Signed)
Defer to Dr. Hisada 

## 2022-09-23 NOTE — Telephone Encounter (Signed)
I have been trying to reach this patient for several days now I have left messages and he is not returning my calls

## 2022-09-29 ENCOUNTER — Other Ambulatory Visit: Payer: Self-pay

## 2022-09-29 ENCOUNTER — Encounter: Payer: Self-pay | Admitting: Internal Medicine

## 2022-09-29 DIAGNOSIS — E785 Hyperlipidemia, unspecified: Secondary | ICD-10-CM

## 2022-09-29 DIAGNOSIS — E1165 Type 2 diabetes mellitus with hyperglycemia: Secondary | ICD-10-CM

## 2022-09-29 MED ORDER — OZEMPIC (1 MG/DOSE) 4 MG/3ML ~~LOC~~ SOPN: PEN_INJECTOR | SUBCUTANEOUS | 2 refills | Status: DC

## 2022-09-29 MED ORDER — DAPAGLIFLOZIN PROPANEDIOL 10 MG PO TABS: ORAL_TABLET | ORAL | 1 refills | Status: DC

## 2022-09-29 MED ORDER — FLUTICASONE PROPIONATE 50 MCG/ACT NA SUSP: NASAL | 1 refills | Status: DC

## 2022-09-29 MED ORDER — ATORVASTATIN CALCIUM 40 MG PO TABS: ORAL_TABLET | ORAL | 3 refills | Status: DC

## 2022-09-29 MED ORDER — METFORMIN HCL 500 MG PO TABS: ORAL_TABLET | ORAL | 1 refills | Status: DC
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
## 2022-10-15 ENCOUNTER — Telehealth (INDEPENDENT_AMBULATORY_CARE_PROVIDER_SITE_OTHER): Payer: 59 | Admitting: Psychiatry

## 2022-10-15 ENCOUNTER — Encounter: Payer: Self-pay | Admitting: Psychiatry

## 2022-10-15 DIAGNOSIS — F4310 Post-traumatic stress disorder, unspecified: Secondary | ICD-10-CM | POA: Diagnosis not present

## 2022-10-15 DIAGNOSIS — R5383 Other fatigue: Secondary | ICD-10-CM

## 2022-10-15 DIAGNOSIS — F330 Major depressive disorder, recurrent, mild: Secondary | ICD-10-CM

## 2022-10-15 DIAGNOSIS — G4700 Insomnia, unspecified: Secondary | ICD-10-CM | POA: Diagnosis not present

## 2022-10-15 MED ORDER — QUETIAPINE FUMARATE 100 MG PO TABS: 100.0000 mg | ORAL_TABLET | Freq: Every day | ORAL | 0 refills | Status: DC

## 2022-10-15 MED ORDER — BUPROPION HCL ER (XL) 300 MG PO TB24: 300.0000 mg | ORAL_TABLET | Freq: Every day | ORAL | 1 refills | Status: DC

## 2022-10-15 NOTE — Patient Instructions (Signed)
Continue Citalopram 40 mg daily Continue bupropion 300 mg daily Continue quetiapine 100 mg at night  Obtain labs - TSH, ferritin, vitamin D, CBC, CMP at [facility] Next appointment: 6/10 at 8 30

## 2022-11-03 ENCOUNTER — Other Ambulatory Visit: Payer: Self-pay | Admitting: *Deleted

## 2022-11-03 MED ORDER — METOPROLOL SUCCINATE ER 25 MG PO TB24: ORAL_TABLET | ORAL | 2 refills | Status: DC

## 2022-12-15 ENCOUNTER — Ambulatory Visit: Payer: 59 | Admitting: Psychiatry

## 2023-01-05 NOTE — Progress Notes (Deleted)
Cardiology Office Note  Date:  01/05/2023   ID:  HARVINDER [name], DOB 1966-02-05, MRN [id number]  PCP:  Dale [place], MD   No chief complaint on file.   HPI:  Dalton [name] is a 57 y.o. 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 coronary artery disease is minimal luminal irregularity. Who presents to establish care in the [facility] office for his aortic valve disorder     He is doing well.  He denies orthopnea, PND, lightheadedness, dyspnea, lower extremity swelling.  He has not had angina.  No neurological complaints.  No bleeding on anticoagulation therapy.   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  The patient's coronary artery disease is minimal luminal irregularity.  He does not have obstructive coronary disease nor does he have symptoms of angina.  Atrial fibrillation has been present for greater than 10 years.  He did have a Maze procedure performed at the time of surgery with left atrial appendage ligation.  He maintained sinus rhythm for short time after surgery in 2021 but later reverted and we have simply decided to go with rate control.    PMH:   has a past medical history of Aortic valve stenosis, Arthritis, CAD (coronary artery disease), Carotid artery disease (HCC), Depression, Diabetes mellitus without complication (HCC), Dilated aortic root (HCC), Dyspnea, Dysrhythmia, Environmental allergies, Headache, Heart murmur, Hypercholesterolemia, Hypertension, Obesity (BMI 30-39.9) (04/26/2018), PTSD (post-traumatic stress disorder), Sleep apnea,  and SVT (supraventricular tachycardia).  PSH:    Past Surgical History:  Procedure Laterality Date   AORTIC VALVE REPLACEMENT N/A 10/04/2020   Procedure: AORTIC VALVE REPLACEMENT (AVR);  Surgeon: Alleen Borne, MD;  Location: [facility] OR;  Service: Open Heart Surgery;  Laterality: N/A;   CARDIAC ELECTROPHYSIOLOGY STUDY AND ABLATION  08/21/2011   COLONOSCOPY WITH PROPOFOL N/A 06/26/2017   Procedure: COLONOSCOPY WITH PROPOFOL;  Surgeon: Scot Jun, MD;  Location[facility];  Service: Endoscopy;  Laterality: N/A;   ESOPHAGOGASTRODUODENOSCOPY (EGD) WITH PROPOFOL N/A 06/26/2017   Procedure: ESOPHAGOGASTRODUODENOSCOPY (EGD) WITH PROPOFOL;  Surgeon: Scot Jun, MD;  Location: [facility] ENDOSCOPY;  Service: Endoscopy;  Laterality: N/A;   MAZE N/A 10/04/2020   Procedure: MAZE;  Surgeon: Alleen Borne, MD;  [facility] OR;  Service: Open Heart Surgery;  Laterality: N/A;   RIGHT/LEFT HEART CATH AND CORONARY ANGIOGRAPHY N/A 11/16/2019   Procedure: RIGHT/LEFT HEART CATH AND CORONARY ANGIOGRAPHY;  Surgeon: Lyn Records, MD;  [facility] INVASIVE CV LAB;  Service: Cardiovascular;  Laterality: N/A;   SEPTOPLASTY N/A 03/22/2015   Procedure: SEPTOPLASTY  WITH RIGHT INFERIOR TURBINATE REDUCTION;  Surgeon: Vernie Murders, MD;  [facility];  Service: ENT;  Laterality: N/A;   SUPRAVENTRICULAR TACHYCARDIA ABLATION N/A 08/21/2011   Procedure: SUPRAVENTRICULAR TACHYCARDIA ABLATION;  Surgeon: Marinus Maw, MD;  Location: [facility] CATH LAB;  Service: Cardiovascular;  Laterality: N/A;   surgery for non descending testicle     TEE WITHOUT CARDIOVERSION N/A 11/16/2019   Procedure: TRANSESOPHAGEAL ECHOCARDIOGRAM (TEE);  Surgeon: Sande Braggs, MD;  Location: [facility] ENDOSCOPY;  Service: Cardiovascular;  Laterality: N/A;   TEE WITHOUT CARDIOVERSION N/A 10/04/2020   Procedure: TRANSESOPHAGEAL ECHOCARDIOGRAM (TEE);  Surgeon: Alleen Borne, MD;  Location: [facility] OR;  Service: Open Heart Surgery;   Laterality: N/A;   TONSILLECTOMY      Current Outpatient Medications  Medication Sig Dispense Refill   acetaminophen (TYLENOL) 500 MG tablet Take 1,000 mg by mouth every 6 (six) hours as needed for moderate pain or headache.     atorvastatin (LIPITOR) 40 MG tablet TAKE 1 TABLET(40 MG) BY MOUTH DAILY 90 tablet 3   buPROPion (WELLBUTRIN XL) 300 MG 24 hr tablet Take 1 tablet (300 mg total) by mouth daily. 90 tablet 1   cetirizine (ZYRTEC) 10 MG tablet Take 10 mg by mouth 2 (two) times daily.      citalopram (CELEXA) 40 MG tablet Take 1 tablet (40 mg total) by mouth every morning. 90 tablet 1   CONTOUR NEXT TEST test strip USE AS DIRECTED TO CHECK BLOOD SUGARS TWICE DAILY 100 strip 1   dapagliflozin propanediol (FARXIGA) 10 MG TABS tablet TAKE 1 TABLET(10 MG) BY MOUTH DAILY 90 tablet 1   digoxin (LANOXIN) 0.125 MG tablet Take 1 tablet (0.125 mg total) by mouth daily. 90 tablet 3   ELIQUIS 5 MG TABS tablet TAKE 1 TABLET(5 MG) BY MOUTH TWICE DAILY 180 tablet 1   fluticasone (FLONASE) 50 MCG/ACT nasal spray SHAKE LIQUID AND USE 2 SPRAYS IN EACH NOSTRIL DAILY AS NEEDED FOR ALLERGIES OR RHINITIS 48 g 1   Lancets (ONETOUCH DELICA PLUS LANCET33G) MISC USE TWICE DAILY AS DIRECTED 100 each 7   metFORMIN (GLUCOPHAGE) 500 MG tablet TAKE 1 TABLET(500 MG) BY MOUTH TWICE DAILY 180 tablet 1   metoprolol succinate (TOPROL-XL) 25 MG 24 hr tablet TAKE 1 TABLET(25 MG) BY MOUTH DAILY 90 tablet 2   NON FORMULARY as directed. CPAP     QUEtiapine (SEROQUEL) 100 MG tablet Take 1 tablet (100 mg total) by mouth at bedtime. 90 tablet 0   sacubitril-valsartan (ENTRESTO) 24-26 MG Take 1 tablet by mouth 2 (two) times daily. 60 tablet 11   Semaglutide, 1 MG/DOSE, (OZEMPIC, 1 MG/DOSE,) 4 MG/3ML SOPN INJECT 1 MG INTO THE SKIN EVERY WEEK 3 mL 2   No current facility-administered medications for this visit.     Allergies:   Patient has no known allergies.   Social History:  The patient  reports that he quit smoking about 34  years ago. His smoking use included cigarettes. He has quit using smokeless tobacco. He reports current alcohol use. He reports that he does not currently use drugs.   Family History:   family history includes Aortic stenosis in his maternal grandfather; Arthritis in his mother; Breast cancer in his maternal grandmother; Cancer in his father and maternal grandfather; Diabetes in his mother; Heart attack in his father; Heart disease in his mother; Kidney disease in his father.    Review of Systems: ROS   PHYSICAL EXAM: VS:  There were no vitals taken for this visit. , BMI There is no height or weight on file to calculate BMI. GEN: Well nourished, well developed, in no acute distress HEENT: normal Neck: no JVD, carotid bruits, or masses Cardiac: RRR; no murmurs, rubs, or gallops,no edema  Respiratory:  clear to auscultation bilaterally, normal work of breathing GI: soft, nontender, nondistended, + BS MS: no deformity or atrophy Skin: warm and dry, no rash Neuro:  Strength and sensation are intact Psych: euthymic mood, full affect    Recent Labs: 03/12/2022: ALT  21; BUN 15; Creatinine, Ser 1.23; Hemoglobin 14.2; Platelets 209.0; Potassium 4.3; Sodium 139    Lipid Panel Lab Results  Component Value Date   CHOL 121 03/12/2022   HDL 27.20 (L) 03/12/2022   LDLCALC 74 10/25/2021   TRIG 304.0 (H) 03/12/2022      Wt Readings from Last 3 Encounters:  03/19/22 248 lb 12.8 oz (112.9 kg)  03/12/22 249 lb 9.6 oz (113.2 kg)  11/11/21 250 lb (113.4 kg)       ASSESSMENT AND PLAN:  Problem List Items Addressed This Visit   None    Disposition:   F/U  12 months   Total encounter time more than 30 minutes  Greater than 50% was spent in counseling and coordination of care with the patient    Signed, Dossie Arbour, M.D., Ph.D. [facility] [place], [place] [phone]

## 2023-01-06 ENCOUNTER — Ambulatory Visit: Payer: 59 | Admitting: Cardiovascular Disease

## 2023-01-18 NOTE — Progress Notes (Signed)
Cardiology Office Note  Date:  01/19/2023   ID:  Dalton [name], DOB February 02, 1966, MRN [id number]  PCP:  Dale , MD   Chief Complaint  Patient presents with   12 month follow up     "Doing well." Patient needs clearance for a DOT physical. Medications reviewed by the patient verbally.     HPI:  Dalton [name] is a 57 y.o. 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 coronary artery disease is minimal luminal irregularity. Who presents to establish care in the [facility] office for his aortic valve disorder, persistent atrial fibrillation  Previously followed in [facility] by Dr. Verdis Prime Last seen September 2023  He is doing well.  He denies orthopnea, PND, lightheadedness, dyspnea, lower extremity swelling.  He has not had angina.  No neurological complaints.  No bleeding on anticoagulation therapy.   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  The patient's coronary artery disease is minimal luminal irregularity.  He does not have obstructive coronary disease nor does he have symptoms of angina.  Atrial fibrillation has been present for greater than 10 years.  He did have a Maze procedure performed at the time of surgery with left atrial appendage ligation.  He maintained sinus rhythm for short time after surgery in 2021 but later reverted and we have simply decided to go with rate control.  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  EKG personally reviewed by myself on todays visit EKG Interpretation Date/Time:  Monday January 19 2023 08:22:12 EDT Ventricular Rate:  80 PR Interval:    QRS Duration:  104 QT Interval:  388 QTC Calculation: 447 R Axis:   54  Text Interpretation: Atrial fibrillation Nonspecific T wave abnormality When compared with ECG of 08-Oct-2020 00:40, Atrial fibrillation has replaced Sinus rhythm Nonspecific T wave abnormality now evident in Lateral leads QT has shortened Confirmed by Julien Nordmann [phone]) on 01/19/2023 8:32:01 AM    PMH:   has a past medical history of Aortic valve stenosis, Arthritis, CAD (coronary artery disease), Carotid artery disease (HCC), Depression, Diabetes mellitus without complication (HCC), Dilated aortic root (HCC), Dyspnea, Dysrhythmia, Environmental allergies, Headache, Heart murmur, Hypercholesterolemia, Hypertension, Obesity (BMI 30-39.9) (04/26/2018), PTSD (post-traumatic stress disorder), Sleep apnea, and SVT (supraventricular tachycardia).  PSH:    Past Surgical History:  Procedure Laterality Date   AORTIC VALVE REPLACEMENT N/A 10/04/2020   Procedure: AORTIC VALVE REPLACEMENT (AVR);  Surgeon: Alleen Borne, MD;  Location: [facility] OR;  Service: Open Heart Surgery;  Laterality: N/A;   CARDIAC ELECTROPHYSIOLOGY STUDY AND ABLATION  08/21/2011   COLONOSCOPY WITH PROPOFOL N/A 06/26/2017   Procedure: COLONOSCOPY WITH PROPOFOL;  Surgeon: Scot Jun, MD;  Location: [facility] ENDOSCOPY;  Service: Endoscopy;  Laterality: N/A;   ESOPHAGOGASTRODUODENOSCOPY (EGD) WITH PROPOFOL N/A 06/26/2017   Procedure: ESOPHAGOGASTRODUODENOSCOPY (EGD) WITH PROPOFOL;  Surgeon: Scot Jun, MD;  Location[facility] ENDOSCOPY;  Service: Endoscopy;  Laterality: N/A;   MAZE N/A 10/04/2020   Procedure: MAZE;  Surgeon: Alleen Borne, MD;  Location: [facility] OR;  Service: Open Heart Surgery;  Laterality: N/A;   RIGHT/LEFT HEART CATH  AND CORONARY ANGIOGRAPHY N/A 11/16/2019   Procedure: RIGHT/LEFT HEART CATH AND CORONARY ANGIOGRAPHY;  Surgeon: Lyn Records, MD;  Location: [facility] INVASIVE CV LAB;  Service: Cardiovascular;  Laterality: N/A;   SEPTOPLASTY N/A 03/22/2015   Procedure: SEPTOPLASTY  WITH RIGHT INFERIOR TURBINATE REDUCTION;  Surgeon: Vernie Murders, MD;  Location: [facility]GERY [facility];  Service: ENT;  Laterality: N/A;   SUPRAVENTRICULAR TACHYCARDIA ABLATION N/A 08/21/2011   Procedure: SUPRAVENTRICULAR TACHYCARDIA ABLATION;  Surgeon: Marinus Maw, MD;  Location: [facility] CATH LAB;  Service: Cardiovascular;  Laterality: N/A;   surgery for non descending testicle     TEE WITHOUT CARDIOVERSION N/A 11/16/2019   Procedure: TRANSESOPHAGEAL ECHOCARDIOGRAM (TEE);  Surgeon: Sande Locascio, MD;  Location: [facility] ENDOSCOPY;  Service: Cardiovascular;  Laterality: N/A;   TEE WITHOUT CARDIOVERSION N/A 10/04/2020   Procedure: TRANSESOPHAGEAL ECHOCARDIOGRAM (TEE);  Surgeon: Alleen Borne, MD;  Location: [facility] OR;  Service: Open Heart Surgery;  Laterality: N/A;   TONSILLECTOMY      Current Outpatient Medications  Medication Sig Dispense Refill   acetaminophen (TYLENOL) 500 MG tablet Take 1,000 mg by mouth every 6 (six) hours as needed for moderate pain or headache.     atorvastatin (LIPITOR) 40 MG tablet TAKE 1 TABLET(40 MG) BY MOUTH DAILY 90 tablet 3   buPROPion (WELLBUTRIN XL) 300 MG 24 hr tablet Take 1 tablet (300 mg total) by mouth daily. 90 tablet 1   cetirizine (ZYRTEC) 10 MG tablet Take 10 mg by mouth 2 (two) times daily.      citalopram (CELEXA) 40 MG tablet Take 1 tablet (40 mg total) by mouth every morning. 90 tablet 1   dapagliflozin propanediol (FARXIGA) 10 MG TABS tablet TAKE 1 TABLET(10 MG) BY MOUTH DAILY 90 tablet 1   digoxin (LANOXIN) 0.125 MG tablet Take 1 tablet (0.125 mg total) by mouth daily. 90 tablet 3   ELIQUIS 5 MG TABS tablet TAKE 1 TABLET(5 MG) BY MOUTH TWICE DAILY 180 tablet 1   fluticasone (FLONASE) 50 MCG/ACT nasal  spray SHAKE LIQUID AND USE 2 SPRAYS IN EACH NOSTRIL DAILY AS NEEDED FOR ALLERGIES OR RHINITIS 48 g 1   metFORMIN (GLUCOPHAGE) 500 MG tablet TAKE 1 TABLET(500 MG) BY MOUTH TWICE DAILY 180 tablet 1   metoprolol succinate (TOPROL-XL) 25 MG 24 hr tablet TAKE 1 TABLET(25 MG) BY MOUTH DAILY 90 tablet 2   NON FORMULARY as directed. CPAP     QUEtiapine (SEROQUEL) 100 MG tablet Take 1 tablet (100 mg total) by mouth at bedtime. 90 tablet 0   sacubitril-valsartan (ENTRESTO) 24-26 MG Take 1 tablet by mouth 2 (two) times daily. 60 tablet 11   Semaglutide, 1 MG/DOSE, (OZEMPIC, 1 MG/DOSE,) 4 MG/3ML SOPN INJECT 1 MG INTO THE SKIN EVERY WEEK 3 mL 2   CONTOUR NEXT TEST test strip USE AS DIRECTED TO CHECK BLOOD SUGARS TWICE DAILY (Patient not taking: Reported on 01/19/2023) 100 strip 1   Lancets (ONETOUCH DELICA PLUS LANCET33G) MISC USE TWICE DAILY AS DIRECTED (Patient not taking: Reported on 01/19/2023) 100 each 7   No current facility-administered medications for this visit.     Allergies:   Patient has no known allergies.   Social History:  The patient  reports that he quit smoking about 34 years ago. His smoking use included cigarettes. He has quit  using smokeless tobacco. He reports current alcohol use. He reports that he does not currently use drugs.   Family History:   family history includes Aortic stenosis in his maternal grandfather; Arthritis in his mother; Breast cancer in his maternal grandmother; Cancer in his father and maternal grandfather; Diabetes in his mother; Heart attack in his father; Heart disease in his mother; Kidney disease in his father.    Review of Systems: Review of Systems  Constitutional: Negative.   HENT: Negative.    Respiratory: Negative.    Cardiovascular: Negative.   Gastrointestinal: Negative.   Musculoskeletal: Negative.   Neurological: Negative.   Psychiatric/Behavioral: Negative.    All other systems reviewed and are negative.    PHYSICAL EXAM: VS:  BP 120/80  (BP Location: Left Arm, Patient Position: Sitting, Cuff Size: Large)   Pulse 80   Ht 6\' 1"  (1.854 m)   Wt 258 lb 4 oz (117.1 kg)   SpO2 97%   BMI 34.07 kg/m  , BMI Body mass index is 34.07 kg/m. GEN: Well nourished, well developed, in no acute distress HEENT: normal Neck: no JVD, carotid bruits, or masses Cardiac: RRR; no murmurs, rubs, or gallops,no edema  Respiratory:  clear to auscultation bilaterally, normal work of breathing GI: soft, nontender, nondistended, + BS MS: no deformity or atrophy Skin: warm and dry, no rash Neuro:  Strength and sensation are intact Psych: euthymic mood, full affect  Recent Labs: 03/12/2022: ALT 21; BUN 15; Creatinine, Ser 1.23; Hemoglobin 14.2; Platelets 209.0; Potassium 4.3; Sodium 139    Lipid Panel Lab Results  Component Value Date   CHOL 121 03/12/2022   HDL 27.20 (L) 03/12/2022   LDLCALC 74 10/25/2021   TRIG 304.0 (H) 03/12/2022      Wt Readings from Last 3 Encounters:  01/19/23 258 lb 4 oz (117.1 kg)  03/19/22 248 lb 12.8 oz (112.9 kg)  03/12/22 249 lb 9.6 oz (113.2 kg)       ASSESSMENT AND PLAN:  Problem List Items Addressed This Visit       Cardiology Problems   Essential hypertension   Relevant Orders   EKG 12-Lead (Completed)   Hyperlipidemia   Atrial fibrillation (HCC)   Relevant Orders   EKG 12-Lead (Completed)     Other   Sleep apnea   Other Visit Diagnoses     Coronary artery disease of native artery of native heart with stable angina pectoris (HCC)    -  Primary   Relevant Orders   EKG 12-Lead (Completed)   Chronic combined systolic and diastolic HF (heart failure), NYHA class 2 (HCC)       Neurocardiogenic pre-syncope       S/P AVR (aortic valve replacement)          Bicuspid aortic valve with aortic valve regurgitation  aortic valve replacement 2022 Repeat echo ordered at his request for DOT physical, EF estimation Denies any fluid retention  Cardiomyopathy EF down to 40 to 45% prior to surgery  2021, stable since then Continue metoprolol succinate, Entresto, Farxiga, digoxin No changes made to medications, appears euvolemic Echocardiogram ordered as above  DOT clearance  Sleep apnea Compliant with CPAP  Coronary disease with stable angina Minimal coronary disease by history Cholesterol at goal  Atrial fibrillation, permanent Decision made by Dr. Katrinka Blazing for rate control in 2022 Tolerating Eliquis 5 twice daily, rate control with metoprolol succinate    Total encounter time more than 40 minutes  Greater than 50% was spent in counseling and  coordination of care with the patient    Signed, Dossie Arbour, M.D., Ph.D. [facility] [place], [place] [phone]

## 2023-01-19 ENCOUNTER — Encounter: Payer: Self-pay | Admitting: Cardiovascular Disease

## 2023-01-19 ENCOUNTER — Ambulatory Visit: Payer: 59 | Attending: Cardiovascular Disease | Admitting: Cardiovascular Disease

## 2023-01-19 VITALS — BP 120/80 | HR 80 | Ht 73.0 in | Wt 258.2 lb

## 2023-01-19 DIAGNOSIS — I5042 Chronic combined systolic (congestive) and diastolic (congestive) heart failure: Secondary | ICD-10-CM | POA: Diagnosis not present

## 2023-01-19 DIAGNOSIS — Z952 Presence of prosthetic heart valve: Secondary | ICD-10-CM

## 2023-01-19 DIAGNOSIS — I4819 Other persistent atrial fibrillation: Secondary | ICD-10-CM | POA: Diagnosis not present

## 2023-01-19 DIAGNOSIS — G4733 Obstructive sleep apnea (adult) (pediatric): Secondary | ICD-10-CM | POA: Diagnosis not present

## 2023-01-19 DIAGNOSIS — I4891 Unspecified atrial fibrillation: Secondary | ICD-10-CM

## 2023-01-19 DIAGNOSIS — I25118 Atherosclerotic heart disease of native coronary artery with other forms of angina pectoris: Secondary | ICD-10-CM

## 2023-01-19 DIAGNOSIS — E785 Hyperlipidemia, unspecified: Secondary | ICD-10-CM

## 2023-01-19 DIAGNOSIS — I10 Essential (primary) hypertension: Secondary | ICD-10-CM

## 2023-01-19 DIAGNOSIS — R55 Syncope and collapse: Secondary | ICD-10-CM

## 2023-01-19 MED ORDER — ATORVASTATIN CALCIUM 40 MG PO TABS
ORAL_TABLET | ORAL | 3 refills | Status: DC
Start: 2023-01-19 — End: 2023-12-29

## 2023-01-19 MED ORDER — DIGOXIN 125 MCG PO TABS: 0.1250 mg | ORAL_TABLET | Freq: Every day | ORAL | 3 refills | Status: DC

## 2023-01-19 MED ORDER — APIXABAN 5 MG PO TABS
5.0000 mg | ORAL_TABLET | Freq: Two times a day (BID) | ORAL | 3 refills | Status: AC
Start: 2023-01-19 — End: ?

## 2023-01-19 MED ORDER — METOPROLOL SUCCINATE ER 25 MG PO TB24: ORAL_TABLET | ORAL | 3 refills | Status: DC

## 2023-01-19 MED ORDER — SACUBITRIL-VALSARTAN 24-26 MG PO TABS: 1.0000 | ORAL_TABLET | Freq: Two times a day (BID) | ORAL | 3 refills | Status: DC

## 2023-01-19 NOTE — Patient Instructions (Signed)
Medication Instructions:  No changes  If you need a refill on your cardiac medications before your next appointment, please call your pharmacy.   Lab work: No new labs needed  Testing/Procedures:  Echo for cardiomyopathy, DOT, atrial fibrillation Exercise treadmill study for DOT physical Your physician has requested that you have an echocardiogram. Echocardiography is a painless test that uses sound waves to create images of your heart. It provides your doctor with information about the size and shape of your heart and how well your heart's chambers and valves are working.   You may receive an ultrasound enhancing agent through an IV if needed to better visualize your heart during the echo. This procedure takes approximately one hour.  There are no restrictions for this procedure.  This will take place at [address] ([facility]) [phone], [place] [address]   Your provider has ordered a exercise tolerance test. This test will evaluate the blood supply to your heart muscle during periods of exercise and rest. For this test, you will raise your heart rate by walking on a treadmill at different levels.   you may eat a light breakfast/ lunch prior to your procedure no caffeine for 24 hours prior to your test (coffee, tea, soft drinks, or chocolate)  no smoking/ vaping for 4 hours prior to your test you may take your regular medications the day of your test except for:   - hold Metoprolol the morning of your test bring any inhalers with you to your test wear comfortable clothing & tennis/ non-skid shoes to walk on the treadmill  This will take place at [address] ([facility]) [address], [place] [address]   Follow-Up: At [facility], you and your health needs are our priority.  As part of our continuing mission to provide you with exceptional heart care, we have created designated Provider Care Teams.  These Care Teams include your primary Cardiologist  (physician) and Advanced Practice Providers (APPs -  Physician Assistants and Nurse Practitioners) who all work together to provide you with the care you need, when you need it.  You will need a follow up appointment in 12 months  Providers on your designated Care Team:   Nicolasa Ducking, NP Eula Listen, PA-C Cadence Fransico Michael, [place]  COVID-19 Vaccine Information can be found at: [email or web address] For questions related to vaccine distribution or appointments, please email [email or web address] or call [phone].

## 2023-01-21 ENCOUNTER — Telehealth: Payer: Self-pay

## 2023-01-21 MED ORDER — CITALOPRAM HYDROBROMIDE 40 MG PO TABS: 40.0000 mg | ORAL_TABLET | Freq: Every morning | ORAL | 0 refills | Status: DC

## 2023-01-21 NOTE — Telephone Encounter (Signed)
left message notifying the patient

## 2023-01-21 NOTE — Telephone Encounter (Signed)
received fax requesting a medication refill on the citalopram. pt was last seen on 10-15-22 next appt 02-02-23. pt needs enough medication to get to his next appt

## 2023-01-22 ENCOUNTER — Other Ambulatory Visit: Payer: Self-pay | Admitting: Internal Medicine

## 2023-01-26 ENCOUNTER — Other Ambulatory Visit: Payer: Self-pay | Admitting: Psychiatry

## 2023-01-26 ENCOUNTER — Telehealth: Payer: Self-pay

## 2023-01-26 MED ORDER — QUETIAPINE FUMARATE 100 MG PO TABS: 100.0000 mg | ORAL_TABLET | Freq: Every day | ORAL | 0 refills | Status: DC

## 2023-01-26 NOTE — Telephone Encounter (Signed)
received fax requesting a refill on the quetiapine 100mg . pt was last seen on 4-10 next appt 7-29

## 2023-01-26 NOTE — Telephone Encounter (Signed)
ordered

## 2023-01-26 NOTE — Telephone Encounter (Signed)
left message notifying patient that rx was sent to the pharmacy

## 2023-01-27 ENCOUNTER — Ambulatory Visit: Payer: 59 | Admitting: Psychiatry

## 2023-01-30 ENCOUNTER — Other Ambulatory Visit (HOSPITAL_COMMUNITY): Payer: Self-pay
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
## 2023-02-02 ENCOUNTER — Encounter: Payer: Self-pay | Admitting: Internal Medicine

## 2023-02-02 ENCOUNTER — Ambulatory Visit (INDEPENDENT_AMBULATORY_CARE_PROVIDER_SITE_OTHER): Payer: 59 | Admitting: Internal Medicine

## 2023-02-02 ENCOUNTER — Encounter: Payer: Self-pay | Admitting: Psychiatry

## 2023-02-02 ENCOUNTER — Ambulatory Visit (INDEPENDENT_AMBULATORY_CARE_PROVIDER_SITE_OTHER): Payer: 59 | Admitting: Psychiatry

## 2023-02-02 VITALS — BP 124/72 | HR 90 | Temp 97.9°F | Resp 16 | Ht 73.0 in | Wt 257.6 lb

## 2023-02-02 VITALS — BP 125/85 | HR 108 | Temp 97.7°F | Ht 73.0 in | Wt 259.6 lb

## 2023-02-02 DIAGNOSIS — I10 Essential (primary) hypertension: Secondary | ICD-10-CM | POA: Diagnosis not present

## 2023-02-02 DIAGNOSIS — G4700 Insomnia, unspecified: Secondary | ICD-10-CM

## 2023-02-02 DIAGNOSIS — F4310 Post-traumatic stress disorder, unspecified: Secondary | ICD-10-CM

## 2023-02-02 DIAGNOSIS — E785 Hyperlipidemia, unspecified: Secondary | ICD-10-CM | POA: Diagnosis not present

## 2023-02-02 DIAGNOSIS — Z860100 Personal history of colon polyps, unspecified: Secondary | ICD-10-CM

## 2023-02-02 DIAGNOSIS — R5383 Other fatigue: Secondary | ICD-10-CM | POA: Diagnosis not present

## 2023-02-02 DIAGNOSIS — I5042 Chronic combined systolic (congestive) and diastolic (congestive) heart failure: Secondary | ICD-10-CM

## 2023-02-02 DIAGNOSIS — I4891 Unspecified atrial fibrillation: Secondary | ICD-10-CM

## 2023-02-02 DIAGNOSIS — Z125 Encounter for screening for malignant neoplasm of prostate: Secondary | ICD-10-CM | POA: Diagnosis not present

## 2023-02-02 DIAGNOSIS — Z0000 Encounter for general adult medical examination without abnormal findings: Secondary | ICD-10-CM | POA: Diagnosis not present

## 2023-02-02 DIAGNOSIS — Z1159 Encounter for screening for other viral diseases: Secondary | ICD-10-CM

## 2023-02-02 DIAGNOSIS — E1165 Type 2 diabetes mellitus with hyperglycemia: Secondary | ICD-10-CM

## 2023-02-02 DIAGNOSIS — Z1211 Encounter for screening for malignant neoplasm of colon: Secondary | ICD-10-CM

## 2023-02-02 DIAGNOSIS — F331 Major depressive disorder, recurrent, moderate: Secondary | ICD-10-CM

## 2023-02-02 DIAGNOSIS — E1169 Type 2 diabetes mellitus with other specified complication: Secondary | ICD-10-CM

## 2023-02-02 DIAGNOSIS — E1159 Type 2 diabetes mellitus with other circulatory complications: Secondary | ICD-10-CM

## 2023-02-02 DIAGNOSIS — I152 Hypertension secondary to endocrine disorders: Secondary | ICD-10-CM

## 2023-02-02 DIAGNOSIS — Z114 Encounter for screening for human immunodeficiency virus [HIV]: Secondary | ICD-10-CM

## 2023-02-02 DIAGNOSIS — G4733 Obstructive sleep apnea (adult) (pediatric): Secondary | ICD-10-CM

## 2023-02-02 DIAGNOSIS — Z952 Presence of prosthetic heart valve: Secondary | ICD-10-CM

## 2023-02-02 DIAGNOSIS — E669 Obesity, unspecified: Secondary | ICD-10-CM

## 2023-02-02 LAB — CBC WITH DIFFERENTIAL/PLATELET
Basophils Absolute: 0.1 10*3/uL (ref 0.0–0.1)
Basophils Relative: 1.1 % (ref 0.0–3.0)
Eosinophils Absolute: 0.1 10*3/uL (ref 0.0–0.7)
Eosinophils Relative: 1.2 % (ref 0.0–5.0)
HCT: 50.4 % (ref 39.0–52.0)
Hemoglobin: 16.7 g/dL (ref 13.0–17.0)
Lymphocytes Relative: 23.2 % (ref 12.0–46.0)
Lymphs Abs: 1.4 10*3/uL (ref 0.7–4.0)
MCHC: 33.2 g/dL (ref 30.0–36.0)
MCV: 93.8 fl (ref 78.0–100.0)
Monocytes Absolute: 0.5 10*3/uL (ref 0.1–1.0)
Monocytes Relative: 8.2 % (ref 3.0–12.0)
Neutro Abs: 3.9 10*3/uL (ref 1.4–7.7)
Neutrophils Relative %: 66.3 % (ref 43.0–77.0)
Platelets: 158 10*3/uL (ref 150.0–400.0)
RBC: 5.37 Mil/uL (ref 4.22–5.81)
RDW: 13.9 % (ref 11.5–15.5)
WBC: 5.9 10*3/uL (ref 4.0–10.5)

## 2023-02-02 LAB — HEPATIC FUNCTION PANEL
ALT: 36 U/L (ref 0–53)
AST: 16 U/L (ref 0–37)
Albumin: 4.8 g/dL (ref 3.5–5.2)
Alkaline Phosphatase: 84 U/L (ref 39–117)
Bilirubin, Direct: 0.1 mg/dL (ref 0.0–0.3)
Total Bilirubin: 0.7 mg/dL (ref 0.2–1.2)
Total Protein: 7 g/dL (ref 6.0–8.3)

## 2023-02-02 LAB — LIPID PANEL
Cholesterol: 146 mg/dL (ref 0–200)
HDL: 39.1 mg/dL (ref >39.00)
LDL Cholesterol: 79 mg/dL (ref 0–99)
NonHDL: 106.75
Total CHOL/HDL Ratio: 4
Triglycerides: 137 mg/dL (ref 0.0–149.0)
VLDL: 27.4 mg/dL (ref 0.0–40.0)

## 2023-02-02 LAB — BASIC METABOLIC PANEL
BUN: 12 mg/dL (ref 6–23)
CO2: 27 mEq/L (ref 19–32)
Calcium: 9.7 mg/dL (ref 8.4–10.5)
Chloride: 100 mEq/L (ref 96–112)
Creatinine, Ser: 1.13 mg/dL (ref 0.40–1.50)
GFR: 72.22 mL/min (ref >60.00)
Glucose, Bld: 265 mg/dL — ABNORMAL HIGH (ref 70–99)
Potassium: 4.8 mEq/L (ref 3.5–5.1)
Sodium: 137 mEq/L (ref 135–145)

## 2023-02-02 LAB — HEMOGLOBIN A1C: Hgb A1c MFr Bld: 8.1 % — ABNORMAL HIGH (ref 4.6–6.5)

## 2023-02-02 LAB — MICROALBUMIN / CREATININE URINE RATIO
Creatinine,U: 134.6 mg/dL
Microalb Creat Ratio: 4.8 mg/g (ref 0.0–30.0)
Microalb, Ur: 6.5 mg/dL — ABNORMAL HIGH (ref 0.0–1.9)

## 2023-02-02 LAB — PSA: PSA: 0.81 ng/mL (ref 0.10–4.00)

## 2023-02-02 LAB — TSH: TSH: 1.49 u[IU]/mL (ref 0.35–5.50)

## 2023-02-02 LAB — HM DIABETES FOOT EXAM

## 2023-02-02 LAB — HM DIABETES EYE EXAM

## 2023-02-02 MED ORDER — CITALOPRAM HYDROBROMIDE 40 MG PO TABS: 40.0000 mg | ORAL_TABLET | Freq: Every morning | ORAL | 0 refills | Status: DC

## 2023-02-02 NOTE — Assessment & Plan Note (Signed)
Physical today 02/02/23.  Colonoscopy 06/2017.  Needs psa checked.  Check today.

## 2023-02-02 NOTE — Progress Notes (Signed)
Subjective:    Patient ID: Dalton [name], male    DOB: 20-Sep-1965, 57 y.o.   MRN: [id number]  Patient here for  Chief Complaint  Patient presents with   Annual Exam    HPI Here for a physical exam. 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 Continues on metoprolol, entrestro, farxiga, eliquis and digoxin. Planning for f/u ECHO and stress test.  Feels symptoms are stable. Wears cpap. Reports has noticed "leaking" around mask and around tubing.  Discussed reevaluation for sleep apnea - question of need for f/u testing.  Is followed by Dr Vanetta Shawl for depression and PTSD. Last evaluated 10/15/22. Continues on citalopram, bupropion and quetiapine.  Increased stress at home - living situation.  Increased stress at work.  Discussed.  He has f/u with her today.  Plans to discuss.  Sleep is an issue.  Feel related to slep apnea and cpap issues. Sugars elevated.  7 day average 224 and 14 day average 210.  90 day average 179.  Discussed diet and exercise.  Has been eating more carbs lately. Discussed overdue colonoscopy.  Agreeable for referral.  Also discussed shingles vaccine.     Past Medical History:  Diagnosis Date   Aortic valve stenosis    a. Bicuspid AV - 2D Echo 06/2014 - mod AS, mild AI, mildly dilated aortic root.   Arthritis    Neck   CAD (coronary artery disease)    Carotid artery disease (HCC)    a. Mild by duplex 05/2015 - 1-39% BICA. Repeat due 05/2017.   Depression    Diabetes mellitus without complication (HCC)    Dilated aortic root (HCC)    a. By echo 06/2014.   Dyspnea    with exercion   Dysrhythmia    Atrial Fibrilation   Environmental allergies    Headache    MIgraines- rare   Heart murmur    Hypercholesterolemia    Hypertension    Obesity (BMI 30-39.9) 04/26/2018   PTSD (post-traumatic stress disorder)    Sleep apnea    CPAP   SVT (supraventricular tachycardia)    a. h/o SVT - Ablation done 2013.    Past Surgical History:  Procedure Laterality Date   AORTIC VALVE REPLACEMENT N/A 10/04/2020   Procedure: AORTIC VALVE REPLACEMENT (AVR);  Surgeon: Alleen Borne, MD;  Location: [facility] OR;  Service: Open Heart Surgery;  Laterality: N/A;   CARDIAC ELECTROPHYSIOLOGY STUDY AND ABLATION  08/21/2011   COLONOSCOPY WITH PROPOFOL N/A 06/26/2017   Procedure: COLONOSCOPY WITH PROPOFOL;  Surgeon: Scot Jun, MD;  Location: [facility] ENDOSCOPY;  Service: Endoscopy;  Laterality: N/A;   ESOPHAGOGASTRODUODENOSCOPY (EGD) WITH PROPOFOL N/A 06/26/2017   Procedure: ESOPHAGOGASTRODUODENOSCOPY (EGD) WITH PROPOFOL;  Surgeon: Scot Jun, MD;  Location: [facility] ENDOSCOPY;  Service: Endoscopy;  Laterality: N/A;   MAZE N/A 10/04/2020   Procedure: MAZE;  Surgeon: Alleen Borne, MD;  Location: [facility] OR;  Service: Open Heart Surgery;  Laterality: N/A;   RIGHT/LEFT HEART CATH AND CORONARY ANGIOGRAPHY N/A 11/16/2019   Procedure: RIGHT/LEFT HEART CATH AND CORONARY ANGIOGRAPHY;  Surgeon: Lyn Records, MD;  Location: [facility] INVASIVE CV LAB;  Service: Cardiovascular;  Laterality: N/A;   SEPTOPLASTY N/A 03/22/2015   Procedure: SEPTOPLASTY  WITH RIGHT INFERIOR TURBINATE REDUCTION;  Surgeon: Vernie Murders, MD;  Location: [facility]GERY [facility];  Service: ENT;  Laterality: N/A;   SUPRAVENTRICULAR TACHYCARDIA ABLATION N/A 08/21/2011   Procedure: SUPRAVENTRICULAR TACHYCARDIA ABLATION;  Surgeon:  Marinus Maw, MD;  Location: [facility] CATH LAB;  Service: Cardiovascular;  Laterality: N/A;   surgery for non descending testicle     TEE WITHOUT CARDIOVERSION N/A 11/16/2019   Procedure: TRANSESOPHAGEAL ECHOCARDIOGRAM (TEE);  Surgeon: Sande Leinen, MD;  Location: [facility] ENDOSCOPY;  Service: Cardiovascular;  Laterality: N/A;   TEE WITHOUT CARDIOVERSION N/A 10/04/2020   Procedure: TRANSESOPHAGEAL ECHOCARDIOGRAM (TEE);  Surgeon: Alleen Borne, MD;  Location: [facility] OR;  Service: Open Heart Surgery;  Laterality: N/A;   TONSILLECTOMY     Family History   Problem Relation Age of Onset   Diabetes Mother    Heart disease Mother    Arthritis Mother    Heart attack Father    Kidney disease Father    Cancer Father    Breast cancer Maternal Grandmother    Cancer Maternal Grandfather    Aortic stenosis Maternal Grandfather    Social History   Socioeconomic History   Marital status: Married    Spouse name: Not on file   Number of children: 2   Years of education: Not on file   Highest education level: Not on file  Occupational History   Occupation: truck Air traffic controller: [employer]  Tobacco Use   Smoking status: Former    Current packs/day: 0.00    Types: Cigarettes    Quit date: 11/04/1988    Years since quitting: 34.2   Smokeless tobacco: Former  Building services engineer status: Never Used  Substance and Sexual Activity   Alcohol use: Yes    Comment: occasionally   Drug use: Not Currently    Comment: many years ago in highschool   Sexual activity: Not Currently  Other Topics Concern   Not on file  Social History Narrative   Very rare exercise   Social Determinants of Health   Financial Resource Strain: Low Risk  (08/19/2021)   Overall Financial Resource Strain (CARDIA)    Difficulty of Paying Living Expenses: Not hard at all  Food Insecurity: Not on file  Transportation Needs: Not on file  Physical Activity: Not on file  Stress: Not on file  Social Connections: Unknown (11/15/2021)   Received from [facility]   Social Network    Social Network: Not on file     Review of Systems  Constitutional:  Negative for appetite change and unexpected weight change.  HENT:  Negative for congestion, sinus pressure and sore throat.   Eyes:  Negative for pain and visual disturbance.  Respiratory:  Negative for cough, chest tightness and shortness of breath.   Cardiovascular:  Negative for chest pain, palpitations and leg swelling.  Gastrointestinal:  Negative for abdominal pain, diarrhea, nausea and vomiting.   Genitourinary:  Negative for difficulty urinating and dysuria.  Musculoskeletal:  Negative for joint swelling and myalgias.  Skin:  Negative for color change and rash.  Neurological:  Negative for dizziness and headaches.  Hematological:  Negative for adenopathy. Does not bruise/bleed easily.  Psychiatric/Behavioral:  Positive for sleep disturbance. Negative for agitation and dysphoric mood.        Increased stress.        Objective:     BP 124/72   Pulse 90   Temp 97.9 F (36.6 C)   Resp 16   Ht 6\' 1"  (1.854 m)   Wt 257 lb 9.6 oz (116.8 kg)   SpO2 97%   BMI 33.99 kg/m  Wt Readings from Last 3 Encounters:  02/02/23 257 lb 9.6 oz (116.8  kg)  01/19/23 258 lb 4 oz (117.1 kg)  03/19/22 248 lb 12.8 oz (112.9 kg)    Physical Exam Constitutional:      General: He is not in acute distress.    Appearance: Normal appearance. He is well-developed.  HENT:     Head: Normocephalic and atraumatic.     Right Ear: External ear normal.     Left Ear: External ear normal.  Eyes:     General: No scleral icterus.       Right eye: No discharge.        Left eye: No discharge.     Conjunctiva/sclera: Conjunctivae normal.  Neck:     Thyroid: No thyromegaly.  Cardiovascular:     Rate and Rhythm: Normal rate and regular rhythm.  Pulmonary:     Effort: No respiratory distress.     Breath sounds: Normal breath sounds. No wheezing.  Abdominal:     General: Bowel sounds are normal.     Palpations: Abdomen is soft.     Tenderness: There is no abdominal tenderness.  Musculoskeletal:        General: No swelling or tenderness.     Cervical back: Neck supple. No tenderness.  Lymphadenopathy:     Cervical: No cervical adenopathy.  Skin:    Findings: No erythema or rash.  Neurological:     Mental Status: He is alert and oriented to person, place, and time.  Psychiatric:        Mood and Affect: Mood normal.        Behavior: Behavior normal.      Outpatient Encounter Medications as of  02/02/2023  Medication Sig   acetaminophen (TYLENOL) 500 MG tablet Take 1,000 mg by mouth every 6 (six) hours as needed for moderate pain or headache.   apixaban (ELIQUIS) 5 MG TABS tablet Take 1 tablet (5 mg total) by mouth 2 (two) times daily.   atorvastatin (LIPITOR) 40 MG tablet TAKE 1 TABLET(40 MG) BY MOUTH DAILY   buPROPion (WELLBUTRIN XL) 300 MG 24 hr tablet Take 1 tablet (300 mg total) by mouth daily.   cetirizine (ZYRTEC) 10 MG tablet Take 10 mg by mouth 2 (two) times daily.    CONTOUR NEXT TEST test strip USE AS DIRECTED TO CHECK BLOOD SUGARS TWICE DAILY   dapagliflozin propanediol (FARXIGA) 10 MG TABS tablet TAKE 1 TABLET(10 MG) BY MOUTH DAILY   digoxin (LANOXIN) 0.125 MG tablet Take 1 tablet (0.125 mg total) by mouth daily.   fluticasone (FLONASE) 50 MCG/ACT nasal spray SHAKE LIQUID AND USE 2 SPRAYS IN EACH NOSTRIL DAILY AS NEEDED FOR ALLERGIES OR RHINITIS   Lancets (ONETOUCH DELICA PLUS LANCET33G) MISC USE TWICE DAILY AS DIRECTED   metFORMIN (GLUCOPHAGE) 500 MG tablet TAKE 1 TABLET(500 MG) BY MOUTH TWICE DAILY   metoprolol succinate (TOPROL-XL) 25 MG 24 hr tablet TAKE 1 TABLET(25 MG) BY MOUTH DAILY   NON FORMULARY as directed. CPAP   QUEtiapine (SEROQUEL) 100 MG tablet Take 1 tablet (100 mg total) by mouth at bedtime.   sacubitril-valsartan (ENTRESTO) 24-26 MG Take 1 tablet by mouth 2 (two) times daily.   Semaglutide, 1 MG/DOSE, (OZEMPIC, 1 MG/DOSE,) 4 MG/3ML SOPN INJECT 1 MG INTO THE SKIN EVERY WEEK   [DISCONTINUED] citalopram (CELEXA) 40 MG tablet Take 1 tablet (40 mg total) by mouth every morning.   No facility-administered encounter medications on file as of 02/02/2023.     Lab Results  Component Value Date   WBC 5.9 02/02/2023   HGB 16.7  02/02/2023   HCT 50.4 02/02/2023   PLT 158.0 02/02/2023   GLUCOSE 265 (H) 02/02/2023   CHOL 146 02/02/2023   TRIG 137.0 02/02/2023   HDL 39.10 02/02/2023   LDLDIRECT 75.0 03/12/2022   LDLCALC 79 02/02/2023   ALT 36 02/02/2023   AST  16 02/02/2023   NA 137 02/02/2023   K 4.8 02/02/2023   CL 100 02/02/2023   CREATININE 1.13 02/02/2023   BUN 12 02/02/2023   CO2 27 02/02/2023   TSH 1.49 02/02/2023   PSA 0.81 02/02/2023   INR 1.4 (H) 10/04/2020   HGBA1C 8.1 (H) 02/02/2023   MICROALBUR 6.5 (H) 02/02/2023    MR ANGIO HEAD WO CONTRAST  Result Date: 12/16/2020  [facility] [address] [place], [place] [address] [phone] NEUROIMAGING REPORT STUDY DATE: 12/15/2020 PATIENT NAME: JISAIAH COURTLAND DOB: 1966/05/15 MRN: [id number] EXAM: MR angiogram of the intracranial arteries ORDERING CLINICIAN: Porfirio Mylar Dohmeier MD CLINICAL HISTORY: 57 year old man with vertigo and aphasia COMPARISON FILMS: None TECHNIQUE: MR angiogram of the head was obtained utilizing 3D time of flight sequences from below the vertebrobasilar junction up to the intracranial vasculature without contrast.  Computerized reconstructions were obtained. CONTRAST: None IMAGING SITE: [facility], [address] [place], [place], [place] FINDINGS: Source and reconstructed views were evaluated.  The imaged extracranial and intracranial segments of the internal carotid arteries appear normal. The middle cerebral and anterior cerebral arteries appear normal. In the posterior circulation, the vertebral arteries are co-dominant. No stenosis is noted within the vertebral arteries and the basilar arteries. The posterior cerebral arteries appear normal.  Posterior communicating arteries are noted, larger on the right. No aneurysms were identified.   This is a normal MR angiogram of the intracranial arteries. INTERPRETING PHYSICIAN: Richard A. Epimenio Foot, MD, PhD, FAAN Certified in  Neuroimaging by [employer]   MR BRAIN W WO CONTRAST  Result Date: 12/16/2020  [facility] [address] [place], [place] [address] [phone] NEUROIMAGING REPORT STUDY DATE: 12/15/2020 PATIENT NAME: RIKI GLADIN DOB: 19-Jul-1965  MRN: [id number] EXAM: MRI Brain with and without contrast ORDERING CLINICIAN: Porfirio Mylar Dohmeier MD CLINICAL HISTORY: 57 year old man with vertigo and aphasia COMPARISON FILMS: None TECHNIQUE:MRI of the brain with and without contrast was obtained utilizing 5 mm axial slices with T1, T2, T2 flair, SWI and diffusion weighted views.  T1 sagittal, T2 coronal and postcontrast views in the axial and coronal plane were obtained. CONTRAST: 20 ml [employer] IMAGING SITE: [facility], [address] [place]. FINDINGS: On sagittal images, the spinal cord is imaged caudally to C3-C4 and is normal in caliber.   The contents of the posterior fossa are of normal size and position.   The pituitary gland and optic chiasm appear normal.    Brain volume appears normal.   The ventricles are normal in size and without distortion.  There are no abnormal extra-axial collections of fluid.  The falx cerebri is heavily calcified with central T1 hyperintensity consistent with marrow.   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  Multiple cerebral expanded Virchow-Robin spaces are noted.  The cerebellum and brainstem appears normal.   The deep gray matter appears normal.    Diffusion weighted images are normal.  Susceptibility weighted images are normal.   The orbits appear normal.   The VIIth/VIIIth  nerve complex appears normal.  The mastoid air cells appear normal.  The paranasal sinuses appear normal.  Flow voids are identified within the major intracerebral arteries.  After the infusion of contrast, a small left cerebellar developmental venous anomaly is noted   This MRI of the brain with and without contrast shows the following: 1.   No acute findings. 2.   Few punctate T2/FLAIR hyperintense foci in the hemispheres consistent with minimal  chronic microvascular ischemic change, typical for age.  There were no medium or large vessel strokes. 3.   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  These are not expected to be clinically significant. INTERPRETING PHYSICIAN: Richard A. Epimenio Foot, MD, PhD, FAAN Certified in  Neuroimaging by [employer]       Assessment & Plan:  Routine general medical examination at a health care facility  Atrial fibrillation, unspecified type [facility]) Assessment & Plan: On eliquis, digoxin and metoprolol.  Followed by cardiology.  Stable. Check digoxin level.   Orders: -     CBC with Differential/Platelet -     Digoxin level  Essential hypertension Assessment & Plan: Blood pressure doing well.  Continue entresto and metoprolol.  Follow pressures.  Follow metabolic panel.   Orders: -     Basic metabolic panel  Hyperlipidemia, unspecified hyperlipidemia type Assessment & Plan: Low cholesterol diet and exercise.  On lipitor.  Follow lipid panel and liver function tests.    Orders: -     TSH -     Hepatic function panel -     Lipid panel  Type 2 diabetes mellitus with hyperglycemia, without long-term current use of insulin (HCC) Assessment & Plan: On ozempic and farxiga.  Low carb diet and exercise.  Follow met b and a1c.  Check today.  Sugars as outlined.  Discussed referral to [facility].    Orders: -     Microalbumin / creatinine urine ratio -     Hemoglobin A1c  Prostate cancer screening -     PSA  Healthcare maintenance Assessment & Plan: Physical today 02/02/23.  Colonoscopy 06/2017.  Needs psa checked.  Check today.    Encounter for hepatitis C screening test for low risk patient -     Hepatitis C antibody  Encounter for screening for HIV -     HIV Antibody (routine testing w rflx)  Colon cancer screening -      Ambulatory referral to Gastroenterology  Chronic combined systolic and diastolic heart failure (HCC) Assessment & Plan: Continues on metoprolol, entrestro, farxiga, eliquis and digoxin. Planning for f/u ECHO and stress test.     History of colon polyps Assessment & Plan: Colonoscopy 06/26/17 - one diminutive polyp in the ascending colon, two diminutive polyps at the recto-sigmoid colon and internal hemorrhoids.  Pathology:   tubular adenomatous polyp and hyperplastic polyps (x2).  Overdue colonoscopy.  Agreeable for referral.     Obesity, diabetes, and hypertension syndrome (HCC) Assessment & Plan: Continue diet, exercise and weight loss.  Low carb diet. Follow.    PTSD (post-traumatic stress disorder) Assessment & Plan:  Is followed by Dr Vanetta Shawl for depression and PTSD. Last evaluated 10/15/22. Continues on citalopram, bupropion and quetiapine.  Increased stress at home - living situation.  Increased stress at work.  Discussed.  He has f/u with her today.  Plans to discuss.    S/P AVR Assessment &  Plan: Saw cardiology 01/19/23 - f/u chronic combined systolic and diastolic HF with EF 40-45%, afib and s/p AVR. Continues on metoprolol, entrestro, farxiga, eliquis and digoxin. Planning for f/u ECHO and stress test.  Feels symptoms are stable.   Obstructive sleep apnea syndrome Assessment & Plan: Wears cpap. Reports has noticed "leaking" around mask and around tubing.  Discussed reevaluation for sleep apnea - question of need for f/u testing      Dale [place], MD

## 2023-02-02 NOTE — Patient Instructions (Signed)
Continue Citalopram 40 mg daily Continue bupropion 300 mg daily Increase quetiapine 150 mg at night   Next appointment: 9/12 at 9 30

## 2023-02-06 ENCOUNTER — Telehealth: Payer: Self-pay

## 2023-02-06 DIAGNOSIS — E1165 Type 2 diabetes mellitus with hyperglycemia: Secondary | ICD-10-CM

## 2023-02-06 NOTE — Telephone Encounter (Signed)
-----   Message from Paisley sent at 02/03/2023  9:58 PM EDT ----- Notify - overall sugar control increased.  A1c 8.1.  confirm on metformin, farxiga and ozempic 1mg  q week.  Low carb diet and exercise.  Needs to check and record blood sugars.  Also, I would like to refer him to [facility] - continue diabetes management.  See if agreeable to increase ozempic to 2mg  q week.  HIV and Hep C - negative. PSA wnl.  Cholesterol levels look good. Hgb, thyroid test, kidney function tests and liver function tests are wnl.

## 2023-02-06 NOTE — Telephone Encounter (Signed)
Patient states he is returning a call from Rita Ohara, LPN.  I read message from Dr. Dale [place] to patient.  Patient states he believes they are having a hard time getting the farxiga.  Patient states, as far as he knows, he has been taking the metformin and ozempic.  Patient states he has been eating a lot of fruits, vegetables, tuna, and salmon.  Patient states the only exercise he does is at work (walking and lifting).  Patient states he doesn't really want to increase his ozempic, but if he has to, then he will.  Patient states he is ok with Dr. Lorin Picket referring him to [facility] to continue diabetes management.

## 2023-02-07 NOTE — Telephone Encounter (Signed)
Given unsure if taking farxiga, please try and confirm.  If not taking, would like to start.  Refer to [facility] for diabetes management.  Also see if able to get assistance getting farxiga if that is the problem.  Will hold on increasing ozempic.

## 2023-02-08 ENCOUNTER — Encounter: Payer: Self-pay | Admitting: Internal Medicine

## 2023-02-08 NOTE — Assessment & Plan Note (Signed)
Continue diet, exercise and weight loss.  Low carb diet. Follow.

## 2023-02-08 NOTE — Assessment & Plan Note (Signed)
Is followed by Dr Vanetta Shawl for depression and PTSD. Last evaluated 10/15/22. Continues on citalopram, bupropion and quetiapine.  Increased stress at home - living situation.  Increased stress at work.  Discussed.  He has f/u with her today.  Plans to discuss.

## 2023-02-08 NOTE — Assessment & Plan Note (Addendum)
On ozempic and farxiga.  Low carb diet and exercise.  Follow met b and a1c.  Check today.  Sugars as outlined.  Discussed referral to [facility].

## 2023-02-08 NOTE — Assessment & Plan Note (Signed)
Continues on metoprolol, entrestro, farxiga, eliquis and digoxin. Planning for f/u ECHO and stress test.

## 2023-02-08 NOTE — Assessment & Plan Note (Signed)
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 Continues on metoprolol, entrestro, farxiga, eliquis and digoxin. Planning for f/u ECHO and stress test.  Feels symptoms are stable.

## 2023-02-08 NOTE — Assessment & Plan Note (Signed)
Colonoscopy 06/26/17 - one diminutive polyp in the ascending colon, two diminutive polyps at the recto-sigmoid colon and internal hemorrhoids.  Pathology:   tubular adenomatous polyp and hyperplastic polyps (x2).  Overdue colonoscopy.  Agreeable for referral.

## 2023-02-08 NOTE — Assessment & Plan Note (Signed)
Low cholesterol diet and exercise.  On lipitor.  Follow lipid panel and liver function tests.   

## 2023-02-08 NOTE — Assessment & Plan Note (Signed)
Wears cpap. Reports has noticed "leaking" around mask and around tubing.  Discussed reevaluation for sleep apnea - question of need for f/u testing

## 2023-02-08 NOTE — Assessment & Plan Note (Signed)
Blood pressure doing well.  Continue entresto and metoprolol.  Follow pressures.  Follow metabolic panel.

## 2023-02-08 NOTE — Assessment & Plan Note (Signed)
On eliquis, digoxin and metoprolol.  Followed by cardiology.  Stable. Check digoxin level.

## 2023-02-11 NOTE — Telephone Encounter (Signed)
LMTCB

## 2023-02-12 ENCOUNTER — Ambulatory Visit
Admission: RE | Admit: 2023-02-12 | Discharge: 2023-02-12 | Disposition: A | Discharge status: Home / Self Care | Payer: 59 | Source: Ambulatory Visit | Attending: Cardiovascular Disease | Admitting: Cardiovascular Disease

## 2023-02-12 DIAGNOSIS — I5042 Chronic combined systolic (congestive) and diastolic (congestive) heart failure: Secondary | ICD-10-CM | POA: Insufficient documentation

## 2023-02-12 DIAGNOSIS — I110 Hypertensive heart disease with heart failure: Secondary | ICD-10-CM | POA: Insufficient documentation

## 2023-02-12 DIAGNOSIS — I4891 Unspecified atrial fibrillation: Secondary | ICD-10-CM | POA: Diagnosis not present

## 2023-02-12 DIAGNOSIS — I25118 Atherosclerotic heart disease of native coronary artery with other forms of angina pectoris: Secondary | ICD-10-CM | POA: Diagnosis present

## 2023-02-12 LAB — EXERCISE TOLERANCE TEST
Angina Index: 0
Estimated workload: 10.1
Exercise duration (min): 8 min
Exercise duration (sec): 51 s
MPHR: 163 {beats}/min
Peak HR: 184 {beats}/min
Percent HR: 112 %
Rest HR: 73 {beats}/min

## 2023-02-17 NOTE — Telephone Encounter (Signed)
Spoke with wife. Patient is taking farxiga. They have been having issues with the pharmacy about filling medication. Should be fixed now. Referral placed to [facility]. Hold on increasing ozempic for now since back on farxiga.

## 2023-02-17 NOTE — Addendum Note (Signed)
Addended by: Rita Ohara D on: 02/17/2023 12:29 PM   Modules accepted: Orders

## 2023-02-18 ENCOUNTER — Ambulatory Visit: Payer: 59 | Attending: Cardiovascular Disease

## 2023-02-18 DIAGNOSIS — I5042 Chronic combined systolic (congestive) and diastolic (congestive) heart failure: Secondary | ICD-10-CM | POA: Diagnosis not present

## 2023-02-18 DIAGNOSIS — I25118 Atherosclerotic heart disease of native coronary artery with other forms of angina pectoris: Secondary | ICD-10-CM | POA: Diagnosis not present

## 2023-02-18 LAB — ECHOCARDIOGRAM COMPLETE
AV Mean grad: 10 mmHg
AV Peak grad: 18.1 mmHg
Ao pk vel: 2.13 m/s
S' Lateral: 4.2 cm

## 2023-02-18 MED ORDER — PERFLUTREN LIPID MICROSPHERE
1.0000 mL | INTRAVENOUS | Status: AC | PRN
Start: 2023-02-18 — End: 2023-02-18
  Administered 2023-02-18: 3 mL via INTRAVENOUS

## 2023-02-20 ENCOUNTER — Telehealth: Payer: Self-pay

## 2023-02-20 NOTE — Progress Notes (Signed)
   Care Guide Note  02/20/2023 Name: Dalton [name] MRN: [id number] DOB: September 06, 1965  Referred by: Dale Zeba, MD Reason for referral : Care Management (Outreach to schedule with pharm d )   Dalton [name] is a 57 y.o. year old male who is a primary care patient of Dale Nason, MD. Dalton [name] was referred to the pharmacist for assistance related to DM.    An unsuccessful telephone outreach was attempted today to contact the patient who was referred to the pharmacy team for assistance with medication management. Additional attempts will be made to contact the patient.   [name], RMA Care Guide [facility]  [place], [place] [address] Direct Dial: [phone] [email or web address]

## 2023-02-24 ENCOUNTER — Encounter: Payer: Self-pay | Admitting: Emergency Medicine

## 2023-02-24 ENCOUNTER — Encounter: Payer: Self-pay | Admitting: Cardiovascular Disease

## 2023-02-24 NOTE — Progress Notes (Signed)
   Care Guide Note  02/24/2023 Name: JEREMIE STEENBERG MRN: [id number] DOB: 11-14-1965  Referred by: Dale Jenks, MD Reason for referral : Care Management (Outreach to schedule with pharm d )   KASHYAP NUTTLE is a 57 y.o. year old male who is a primary care patient of Dale [place], MD. CODYE NEBERGALL was referred to the pharmacist for assistance related to DM.    A second unsuccessful telephone outreach was attempted today to contact the patient who was referred to the pharmacy team for assistance with medication management. Additional attempts will be made to contact the patient.  [name], RMA Care Guide [facility]  [place], [place] [address] Direct Dial: [phone] [email or web address]

## 2023-03-03 NOTE — Progress Notes (Signed)
   Care Guide Note  03/03/2023 Name: Dalton [name] MRN: [id number] DOB: [birth year]  Referred by: Dale [place], MD Reason for referral : Care Management (Outreach to schedule with pharm d )   [name] is a 57 y.o. year old male who is a primary care patient of Dale [place], MD. [name] was referred to the pharmacist for assistance related to DM.    A third unsuccessful telephone outreach was attempted today to contact the patient who was referred to the pharmacy team for assistance with medication management. The Population Health team is pleased to engage with this patient at any time in the future upon receipt of referral and should he/she be interested in assistance from the [facility] team.   [name], RMA Care Guide [facility]  [place], [place] [address] Direct Dial: [phone] [email or web address]
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
## 2023-03-19 ENCOUNTER — Encounter: Payer: Self-pay | Admitting: Psychiatry

## 2023-03-19 ENCOUNTER — Ambulatory Visit (INDEPENDENT_AMBULATORY_CARE_PROVIDER_SITE_OTHER): Payer: 59 | Admitting: Psychiatry

## 2023-03-19 VITALS — BP 119/77 | HR 94 | Temp 97.2°F | Ht 73.0 in | Wt 259.0 lb

## 2023-03-19 DIAGNOSIS — G2581 Restless legs syndrome: Secondary | ICD-10-CM

## 2023-03-19 DIAGNOSIS — F4310 Post-traumatic stress disorder, unspecified: Secondary | ICD-10-CM

## 2023-03-19 DIAGNOSIS — E559 Vitamin D deficiency, unspecified: Secondary | ICD-10-CM

## 2023-03-19 DIAGNOSIS — G4700 Insomnia, unspecified: Secondary | ICD-10-CM

## 2023-03-19 DIAGNOSIS — F331 Major depressive disorder, recurrent, moderate: Secondary | ICD-10-CM | POA: Diagnosis not present

## 2023-03-19 MED ORDER — QUETIAPINE FUMARATE 100 MG PO TABS: 150.0000 mg | ORAL_TABLET | Freq: Every day | ORAL | 0 refills | Status: DC

## 2023-03-19 NOTE — Patient Instructions (Signed)
Continue Citalopram 40 mg daily Continue bupropion 300 mg daily Continue quetiapine 150 mg at night  Obtain lab (TSH, iron panels) Next appointment: 11/4 at 10:30,

## 2023-04-09 ENCOUNTER — Encounter: Payer: Self-pay | Admitting: Cardiovascular Disease

## 2023-04-09 ENCOUNTER — Encounter: Payer: Self-pay | Admitting: Internal Medicine

## 2023-04-09 ENCOUNTER — Other Ambulatory Visit: Payer: Self-pay | Admitting: Emergency Medicine

## 2023-04-09 DIAGNOSIS — E785 Hyperlipidemia, unspecified: Secondary | ICD-10-CM

## 2023-04-09 DIAGNOSIS — I4891 Unspecified atrial fibrillation: Secondary | ICD-10-CM

## 2023-04-15 ENCOUNTER — Other Ambulatory Visit: Payer: Self-pay

## 2023-04-15 DIAGNOSIS — E1165 Type 2 diabetes mellitus with hyperglycemia: Secondary | ICD-10-CM

## 2023-04-15 MED ORDER — METFORMIN HCL 500 MG PO TABS
ORAL_TABLET | ORAL | 1 refills | Status: DC
Start: 2023-04-15 — End: 2023-09-02

## 2023-04-15 MED ORDER — OZEMPIC (1 MG/DOSE) 4 MG/3ML ~~LOC~~ SOPN
PEN_INJECTOR | SUBCUTANEOUS | 2 refills | Status: DC
Start: 2023-04-15 — End: 2023-08-07

## 2023-04-15 MED ORDER — DAPAGLIFLOZIN PROPANEDIOL 10 MG PO TABS: ORAL_TABLET | ORAL | 1 refills | Status: DC

## 2023-04-15 MED ORDER — FLUTICASONE PROPIONATE 50 MCG/ACT NA SUSP: NASAL | 1 refills | Status: DC
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
## 2023-05-07 ENCOUNTER — Encounter: Payer: Self-pay | Admitting: Internal Medicine

## 2023-05-07 ENCOUNTER — Ambulatory Visit: Payer: 59 | Admitting: Internal Medicine

## 2023-05-07 VITALS — BP 116/70 | HR 88 | Temp 98.2°F | Resp 16 | Ht 73.0 in | Wt 265.0 lb

## 2023-05-07 DIAGNOSIS — E1159 Type 2 diabetes mellitus with other circulatory complications: Secondary | ICD-10-CM

## 2023-05-07 DIAGNOSIS — Z1211 Encounter for screening for malignant neoplasm of colon: Secondary | ICD-10-CM

## 2023-05-07 DIAGNOSIS — Z860100 Personal history of colon polyps, unspecified: Secondary | ICD-10-CM | POA: Diagnosis not present

## 2023-05-07 DIAGNOSIS — K3189 Other diseases of stomach and duodenum: Secondary | ICD-10-CM

## 2023-05-07 DIAGNOSIS — E669 Obesity, unspecified: Secondary | ICD-10-CM

## 2023-05-07 DIAGNOSIS — E1169 Type 2 diabetes mellitus with other specified complication: Secondary | ICD-10-CM

## 2023-05-07 DIAGNOSIS — Z952 Presence of prosthetic heart valve: Secondary | ICD-10-CM

## 2023-05-07 DIAGNOSIS — F4310 Post-traumatic stress disorder, unspecified: Secondary | ICD-10-CM

## 2023-05-07 DIAGNOSIS — I10 Essential (primary) hypertension: Secondary | ICD-10-CM

## 2023-05-07 DIAGNOSIS — Z7984 Long term (current) use of oral hypoglycemic drugs: Secondary | ICD-10-CM | POA: Diagnosis not present

## 2023-05-07 DIAGNOSIS — I152 Hypertension secondary to endocrine disorders: Secondary | ICD-10-CM

## 2023-05-07 DIAGNOSIS — I4891 Unspecified atrial fibrillation: Secondary | ICD-10-CM

## 2023-05-07 DIAGNOSIS — E041 Nontoxic single thyroid nodule: Secondary | ICD-10-CM

## 2023-05-07 DIAGNOSIS — E785 Hyperlipidemia, unspecified: Secondary | ICD-10-CM | POA: Diagnosis not present

## 2023-05-07 DIAGNOSIS — G4733 Obstructive sleep apnea (adult) (pediatric): Secondary | ICD-10-CM

## 2023-05-07 DIAGNOSIS — E1165 Type 2 diabetes mellitus with hyperglycemia: Secondary | ICD-10-CM | POA: Diagnosis not present

## 2023-05-07 DIAGNOSIS — I5042 Chronic combined systolic (congestive) and diastolic (congestive) heart failure: Secondary | ICD-10-CM

## 2023-05-07 LAB — HEPATIC FUNCTION PANEL
ALT: 28 U/L (ref 0–53)
AST: 17 U/L (ref 0–37)
Albumin: 4.6 g/dL (ref 3.5–5.2)
Alkaline Phosphatase: 77 U/L (ref 39–117)
Bilirubin, Direct: 0.1 mg/dL (ref 0.0–0.3)
Total Bilirubin: 0.6 mg/dL (ref 0.2–1.2)
Total Protein: 6.9 g/dL (ref 6.0–8.3)

## 2023-05-07 LAB — LIPID PANEL
Cholesterol: 117 mg/dL (ref 0–200)
HDL: 31.9 mg/dL — ABNORMAL LOW (ref >39.00)
LDL Cholesterol: 53 mg/dL (ref 0–99)
NonHDL: 85.51
Total CHOL/HDL Ratio: 4
Triglycerides: 165 mg/dL — ABNORMAL HIGH (ref 0.0–149.0)
VLDL: 33 mg/dL (ref 0.0–40.0)

## 2023-05-07 LAB — BASIC METABOLIC PANEL
BUN: 14 mg/dL (ref 6–23)
CO2: 26 meq/L (ref 19–32)
Calcium: 9.3 mg/dL (ref 8.4–10.5)
Chloride: 103 meq/L (ref 96–112)
Creatinine, Ser: 1.22 mg/dL (ref 0.40–1.50)
GFR: 65.76 mL/min (ref >60.00)
Glucose, Bld: 155 mg/dL — ABNORMAL HIGH (ref 70–99)
Potassium: 4 meq/L (ref 3.5–5.1)
Sodium: 140 meq/L (ref 135–145)

## 2023-05-07 LAB — HEMOGLOBIN A1C: Hgb A1c MFr Bld: 8.4 % — ABNORMAL HIGH (ref 4.6–6.5)

## 2023-05-07 NOTE — Progress Notes (Unsigned)
Subjective:    Patient ID: Dalton [name], male    DOB: 1966-03-25, 57 y.o.   MRN: [id number]  Patient here for  Chief Complaint  Patient presents with   Medical Management of Chronic Issues    HPI Here for a scheduled follow up. 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 Continues on metoprolol, entrestro, farxiga, eliquis and digoxin. Had f/u echo 02/18/23 - EF 40-45% with normal RV size and function  no significant valvular heart disease. Seeing Dr Valentino Nose for f/u PTSD, depression and anxiety. Continues on quetiapine, citalopram and bupropion.  Discussed. Increased stress. Plans to discuss with Dr Valentino Nose next week.  Last A1c elevated 8.1.  per report, had issues with pharmacy refilling medication.  Taking farxiga, metformin and ozempic. Am sugars averaging 150s. Pm sugars increased to 200 - when eats later.  No chest pain  breathing stable.  Increased stress.  Discussed.  Due to f/u with psychiatry next week.  No SI. Increased problems with sleep.  Feels when he lies down - nose closes up.  This occurs despite using flonase or saline.  Has had a facial injury previously - occurred when he was in the [employer].  Has seen Dr Elenore Rota.  Previous sinus surgery.  Feels he mouths breathes. Using cpap regularly. Not helping his sleep.     Past Medical History:  Diagnosis Date   Aortic valve stenosis    a. Bicuspid AV - 2D Echo 06/2014 - mod AS, mild AI, mildly dilated aortic root.   Arthritis    Neck   CAD (coronary artery disease)    Carotid artery disease (HCC)    a. Mild by duplex 05/2015 - 1-39% BICA. Repeat due 05/2017.   Depression    Diabetes mellitus without complication (HCC)    Dilated aortic root (HCC)    a. By echo 06/2014.   Dyspnea    with exercion   Dysrhythmia    Atrial Fibrilation   Environmental allergies    Headache    MIgraines- rare   Heart murmur    Hypercholesterolemia    Hypertension    Obesity  (BMI 30-39.9) 04/26/2018   PTSD (post-traumatic stress disorder)    Sleep apnea    CPAP   SVT (supraventricular tachycardia) (HCC)    a. h/o SVT - Ablation done 2013.   Past Surgical History:  Procedure Laterality Date   AORTIC VALVE REPLACEMENT N/A 10/04/2020   Procedure: AORTIC VALVE REPLACEMENT (AVR);  Surgeon: Alleen Borne, MD;  Location: [facility] OR;  Service: Open Heart Surgery;  Laterality: N/A;   CARDIAC ELECTROPHYSIOLOGY STUDY AND ABLATION  08/21/2011   COLONOSCOPY WITH PROPOFOL N/A 06/26/2017   Procedure: COLONOSCOPY WITH PROPOFOL;  Surgeon: Scot Jun, MD;  Location: [facility] ENDOSCOPY;  Service: Endoscopy;  Laterality: N/A;   ESOPHAGOGASTRODUODENOSCOPY (EGD) WITH PROPOFOL N/A 06/26/2017   Procedure: ESOPHAGOGASTRODUODENOSCOPY (EGD) WITH PROPOFOL;  Surgeon: Scot Jun, MD;  Location: [facility] ENDOSCOPY;  Service: Endoscopy;  Laterality: N/A;   MAZE N/A 10/04/2020   Procedure: MAZE;  Surgeon: Alleen Borne, MD;  Location: [facility] OR;  Service: Open Heart Surgery;  Laterality: N/A;   RIGHT/LEFT HEART CATH AND CORONARY ANGIOGRAPHY N/A 11/16/2019   Procedure: RIGHT/LEFT HEART CATH AND CORONARY ANGIOGRAPHY;  Surgeon: Lyn Records, MD;  Location: [facility] INVASIVE CV LAB;  Service: Cardiovascular;  Laterality: N/A;   SEPTOPLASTY N/A 03/22/2015   Procedure: SEPTOPLASTY  WITH RIGHT INFERIOR TURBINATE REDUCTION;  Surgeon:  Vernie Murders, MD;  Location: [facility] SURGERY CNTR;  Service: ENT;  Laterality: N/A;   SUPRAVENTRICULAR TACHYCARDIA ABLATION N/A 08/21/2011   Procedure: SUPRAVENTRICULAR TACHYCARDIA ABLATION;  Surgeon: Marinus Maw, MD;  Location: [facility] CATH LAB;  Service: Cardiovascular;  Laterality: N/A;   surgery for non descending testicle     TEE WITHOUT CARDIOVERSION N/A 11/16/2019   Procedure: TRANSESOPHAGEAL ECHOCARDIOGRAM (TEE);  Surgeon: Sande Knueppel, MD;  Location: [facility] ENDOSCOPY;  Service: Cardiovascular;  Laterality: N/A;   TEE WITHOUT CARDIOVERSION N/A 10/04/2020   Procedure:  TRANSESOPHAGEAL ECHOCARDIOGRAM (TEE);  Surgeon: Alleen Borne, MD;  Location: [facility] OR;  Service: Open Heart Surgery;  Laterality: N/A;   TONSILLECTOMY     Family History  Problem Relation Age of Onset   Diabetes Mother    Heart disease Mother    Arthritis Mother    Heart attack Father    Kidney disease Father    Cancer Father    Breast cancer Maternal Grandmother    Cancer Maternal Grandfather    Aortic stenosis Maternal Grandfather    Social History   Socioeconomic History   Marital status: Married    Spouse name: Not on file   Number of children: 2   Years of education: Not on file   Highest education level: 12th grade  Occupational History   Occupation: truck Air traffic controller: [employer]  Tobacco Use   Smoking status: Former    Current packs/day: 0.00    Types: Cigarettes    Quit date: 11/04/1988    Years since quitting: 34.5   Smokeless tobacco: Former  Building services engineer status: Never Used  Substance and Sexual Activity   Alcohol use: Yes    Comment: occasionally   Drug use: Not Currently    Comment: many years ago in highschool   Sexual activity: Not Currently  Other Topics Concern   Not on file  Social History Narrative   Very rare exercise   Social Determinants of Health   Financial Resource Strain: Patient Declined (05/05/2023)   Overall Financial Resource Strain (CARDIA)    Difficulty of Paying Living Expenses: Patient declined  Recent Concern: Financial Resource Strain - Medium Risk (03/19/2023)   Received from [facility]   Overall Financial Resource Strain (CARDIA)    Difficulty of Paying Living Expenses: Somewhat hard  Food Insecurity: Unknown (05/05/2023)   Hunger Vital Sign    Worried About Running Out of Food in the Last Year: Not on file    Ran Out of Food in the Last Year: Patient declined  Transportation Needs: No Transportation Needs (05/05/2023)   PRAPARE - Administrator, Civil Service (Medical): No    Lack of  Transportation (Non-Medical): No  Physical Activity: Unknown (05/05/2023)   Exercise Vital Sign    Days of Exercise per Week: 5 days    Minutes of Exercise per Session: Patient declined  Recent Concern: Physical Activity - Insufficiently Active (03/19/2023)   Received from [facility]   Exercise Vital Sign    Days of Exercise per Week: 5 days    Minutes of Exercise per Session: 10 min  Stress: Stress Concern Present (05/05/2023)   [employer] of Occupational Health - Occupational Stress Questionnaire    Feeling of Stress : Rather much  Social Connections: Unknown (05/05/2023)   Social Connection and Isolation Panel [NHANES]    Frequency of Communication with Friends and Family: More than three times a week    Frequency of  Social Gatherings with Friends and Family: Patient declined    Attends Religious Services: Patient declined    Database administrator or Organizations: Patient declined    Attends Engineer, structural: Not on file    Marital Status: Married     Review of Systems  Constitutional:  Positive for fatigue. Negative for unexpected weight change.  HENT:  Negative for congestion and sinus pressure.   Respiratory:  Negative for cough, chest tightness and shortness of breath.   Cardiovascular:  Negative for chest pain, palpitations and leg swelling.  Gastrointestinal:  Negative for abdominal pain, diarrhea, nausea and vomiting.  Musculoskeletal:  Negative for joint swelling and myalgias.  Skin:  Negative for color change and rash.  Neurological:  Positive for speech difficulty. Negative for headaches.  Psychiatric/Behavioral:  Negative for agitation.        Increased stress as outlined.        Objective:     BP 116/70   Pulse 88   Temp 98.2 F (36.8 C)   Resp 16   Ht 6\' 1"  (1.854 m)   Wt 265 lb (120.2 kg)   SpO2 98%   BMI 34.96 kg/m  Wt Readings from Last 3 Encounters:  05/07/23 265 lb (120.2 kg)  03/19/23 259 lb (117.5 kg)  02/02/23 259  lb 9.6 oz (117.8 kg)    Physical Exam Vitals reviewed.  Constitutional:      General: He is not in acute distress.    Appearance: Normal appearance. He is well-developed.  HENT:     Head: Normocephalic and atraumatic.     Right Ear: External ear normal.     Left Ear: External ear normal.  Eyes:     General: No scleral icterus.       Right eye: No discharge.        Left eye: No discharge.     Conjunctiva/sclera: Conjunctivae normal.  Cardiovascular:     Rate and Rhythm: Normal rate and regular rhythm.  Pulmonary:     Effort: Pulmonary effort is normal. No respiratory distress.     Breath sounds: Normal breath sounds.  Abdominal:     General: Bowel sounds are normal.     Palpations: Abdomen is soft.     Tenderness: There is no abdominal tenderness.  Musculoskeletal:        General: No swelling or tenderness.     Cervical back: Neck supple. No tenderness.  Lymphadenopathy:     Cervical: No cervical adenopathy.  Skin:    Findings: No erythema or rash.  Neurological:     Mental Status: He is alert.  Psychiatric:        Mood and Affect: Mood normal.        Behavior: Behavior normal.      Outpatient Encounter Medications as of 05/07/2023  Medication Sig   acetaminophen (TYLENOL) 500 MG tablet Take 1,000 mg by mouth every 6 (six) hours as needed for moderate pain or headache.   apixaban (ELIQUIS) 5 MG TABS tablet Take 1 tablet (5 mg total) by mouth 2 (two) times daily.   atorvastatin (LIPITOR) 40 MG tablet TAKE 1 TABLET(40 MG) BY MOUTH DAILY   buPROPion (WELLBUTRIN XL) 300 MG 24 hr tablet Take 1 tablet (300 mg total) by mouth daily.   cetirizine (ZYRTEC) 10 MG tablet Take 10 mg by mouth 2 (two) times daily.    citalopram (CELEXA) 40 MG tablet Take 1 tablet (40 mg total) by mouth every morning.   CONTOUR  NEXT TEST test strip USE AS DIRECTED TO CHECK BLOOD SUGARS TWICE DAILY   dapagliflozin propanediol (FARXIGA) 10 MG TABS tablet TAKE 1 TABLET(10 MG) BY MOUTH DAILY   digoxin  (LANOXIN) 0.125 MG tablet Take 1 tablet (0.125 mg total) by mouth daily.   fluticasone (FLONASE) 50 MCG/ACT nasal spray SHAKE LIQUID AND USE 2 SPRAYS IN EACH NOSTRIL DAILY AS NEEDED FOR ALLERGIES OR RHINITIS   Lancets (ONETOUCH DELICA PLUS LANCET33G) MISC USE TWICE DAILY AS DIRECTED   metFORMIN (GLUCOPHAGE) 500 MG tablet TAKE 1 TABLET(500 MG) BY MOUTH TWICE DAILY   metoprolol succinate (TOPROL-XL) 25 MG 24 hr tablet TAKE 1 TABLET(25 MG) BY MOUTH DAILY   NON FORMULARY as directed. CPAP   QUEtiapine (SEROQUEL) 100 MG tablet Take 1.5 tablets (150 mg total) by mouth at bedtime.   sacubitril-valsartan (ENTRESTO) 24-26 MG Take 1 tablet by mouth 2 (two) times daily.   Semaglutide, 1 MG/DOSE, (OZEMPIC, 1 MG/DOSE,) 4 MG/3ML SOPN INJECT 1 MG INTO THE SKIN EVERY WEEK   No facility-administered encounter medications on file as of 05/07/2023.     Lab Results  Component Value Date   WBC 5.9 02/02/2023   HGB 16.7 02/02/2023   HCT 50.4 02/02/2023   PLT 158.0 02/02/2023   GLUCOSE 265 (H) 02/02/2023   CHOL 146 02/02/2023   TRIG 137.0 02/02/2023   HDL 39.10 02/02/2023   LDLDIRECT 75.0 03/12/2022   LDLCALC 79 02/02/2023   ALT 36 02/02/2023   AST 16 02/02/2023   NA 137 02/02/2023   K 4.8 02/02/2023   CL 100 02/02/2023   CREATININE 1.13 02/02/2023   BUN 12 02/02/2023   CO2 27 02/02/2023   TSH 1.49 02/02/2023   PSA 0.81 02/02/2023   INR 1.4 (H) 10/04/2020   HGBA1C 8.1 (H) 02/02/2023   MICROALBUR 6.5 (H) 02/02/2023    EXERCISE TOLERANCE TEST (ETT)  Result Date: 02/13/2023   Baseline ECG demonstrates atrial fibrillation without significant ST/T abnormalities.   The patient exhibits good functional capacity with normal heart rate and blood pressure responses.  No angina was reported.   1.5-2 mm downsloping ST depressions and T wave inversions are seen in the inferolateral leads during peak stress.   Rare PVC's occurred during stress.  Atrial fibrillation persisted throughout stress and recovery.    Intermediate risk stress test (Duke Treadmill Score -1 to +1.5). Intermediate risk exercise tolerance test with 1.5-2 mm downsloping ST depressions and T wave inversions in the inferolateral leads at peak stress.  Persistent atrial fibrillation noted.       Assessment & Plan:  Type 2 diabetes mellitus with hyperglycemia, without long-term current use of insulin (HCC) -     Basic metabolic panel -     Hemoglobin A1c  Hyperlipidemia, unspecified hyperlipidemia type -     Hepatic function panel -     Lipid panel  History of colon polyps Assessment & Plan: Colonoscopy 06/26/17 - one diminutive polyp in the ascending colon, two diminutive polyps at the recto-sigmoid colon and internal hemorrhoids.  Pathology:   tubular adenomatous polyp and hyperplastic polyps (x2).  Overdue colonoscopy.  Agreeable for referral.     Essential hypertension  Atrial fibrillation, unspecified type [facility]) Assessment & Plan: On eliquis, digoxin and metoprolol.  Followed by cardiology.  Stable. Recent digoxin level .7.   Colon cancer screening -     Ambulatory referral to Gastroenterology     Dale [place]and[place], MD

## 2023-05-07 NOTE — Assessment & Plan Note (Signed)
Colonoscopy 06/26/17 - one diminutive polyp in the ascending colon, two diminutive polyps at the recto-sigmoid colon and internal hemorrhoids.  Pathology:   tubular adenomatous polyp and hyperplastic polyps (x2).  Overdue colonoscopy.  Agreeable for referral.

## 2023-05-07 NOTE — Assessment & Plan Note (Addendum)
On eliquis, digoxin and metoprolol.  Followed by cardiology.  Stable. Recent digoxin level .7.

## 2023-05-08 NOTE — Assessment & Plan Note (Signed)
On ozempic, metformin and farxiga.  Low carb diet and exercise.  Follow met b and a1c.  Check today.  Sugars as outlined.  Discussed need for better control. Discussed increasing ozempic.

## 2023-05-09 ENCOUNTER — Encounter: Payer: Self-pay | Admitting: Internal Medicine

## 2023-05-09 NOTE — Assessment & Plan Note (Signed)
Blood pressure doing well.  Continue entresto and metoprolol.  Follow pressures.  Follow metabolic panel.

## 2023-05-09 NOTE — Assessment & Plan Note (Signed)
Wears cpap. Reports has noticed "leaking" around mask and around tubing.  Discussed reevaluation for sleep apnea - question of need for f/u testing. Increased problems with sleep.  Feels when he lies down - nose closes up.  This occurs despite using flonase or saline.  Has had a facial injury previously - occurred when he was in the [employer].  Has seen Dr Elenore Rota.  Previous sinus surgery.  Feels he mouths breathes. Using cpap regularly. Not helping his sleep. Discussed f/u regarding sleep apnea.  Prefers not to travel to [employer].  Request referral to pulmonary in [place].

## 2023-05-09 NOTE — Assessment & Plan Note (Signed)
Noted on CT.  Is s/p thyroid biopsy.  Biopsy negative. Previous ultrasound - multinodular thyroid. Confirm no f/u warranted.

## 2023-05-09 NOTE — Assessment & Plan Note (Signed)
Low cholesterol diet and exercise.  On lipitor.  Follow lipid panel and liver function tests.   

## 2023-05-09 NOTE — Assessment & Plan Note (Signed)
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 Continues on metoprolol, entrestro, farxiga, eliquis and digoxin.  Had f/u echo 02/18/23 - EF 40-45% with normal RV size and function  no significant valvular heart disease. Stable.

## 2023-05-09 NOTE — Assessment & Plan Note (Signed)
Found on EGD 06/2017.  No upper symptoms.  Fatty liver on ultrasound.   °

## 2023-05-09 NOTE — Assessment & Plan Note (Signed)
Continue diet, exercise and weight loss.  Low carb diet. Follow.

## 2023-05-09 NOTE — Assessment & Plan Note (Signed)
Continues on metoprolol, entrestro, farxiga, eliquis and digoxin. Had f/u echo 02/18/23 - EF 40-45% with normal RV size and function  no significant valvular heart disease.

## 2023-05-09 NOTE — Assessment & Plan Note (Signed)
Seeing Dr Valentino Nose for f/u PTSD, depression and anxiety. Continues on quetiapine, citalopram and bupropion.  Discussed. Increased stress. Plans to discuss with Dr Valentino Nose next week.

## 2023-05-11 ENCOUNTER — Ambulatory Visit (INDEPENDENT_AMBULATORY_CARE_PROVIDER_SITE_OTHER): Payer: 59 | Admitting: Psychiatry

## 2023-05-11 ENCOUNTER — Telehealth: Payer: Self-pay

## 2023-05-11 ENCOUNTER — Encounter: Payer: Self-pay | Admitting: Psychiatry

## 2023-05-11 VITALS — BP 126/87 | HR 93 | Temp 98.0°F | Ht 73.0 in | Wt 266.0 lb

## 2023-05-11 DIAGNOSIS — F331 Major depressive disorder, recurrent, moderate: Secondary | ICD-10-CM | POA: Diagnosis not present

## 2023-05-11 DIAGNOSIS — G4700 Insomnia, unspecified: Secondary | ICD-10-CM

## 2023-05-11 DIAGNOSIS — F4310 Post-traumatic stress disorder, unspecified: Secondary | ICD-10-CM

## 2023-05-11 MED ORDER — QUETIAPINE FUMARATE 100 MG PO TABS: 150.0000 mg | ORAL_TABLET | Freq: Every day | ORAL | 0 refills | Status: DC

## 2023-05-11 MED ORDER — BUPROPION HCL ER (XL) 300 MG PO TB24: 300.0000 mg | ORAL_TABLET | Freq: Every day | ORAL | 1 refills | Status: DC

## 2023-05-11 MED ORDER — SERTRALINE HCL 100 MG PO TABS: 100.0000 mg | ORAL_TABLET | Freq: Every day | ORAL | 1 refills | Status: DC

## 2023-05-11 MED ORDER — SERTRALINE HCL 25 MG PO TABS: ORAL_TABLET | ORAL | 0 refills | Status: DC

## 2023-05-11 NOTE — Patient Instructions (Signed)
Decrease citalopram 20 mg daily for one week, then discontinue  Sertraline 25 mg at night for 1 week , then 50 mg at night for 1 week , then 100 mg at night  Continue bupropion 300 mg daily Continue quetiapine 150 mg at night Obtain lab (iron panels) Therapy referral onsite Next appointment: 1/2 at 8:30

## 2023-05-11 NOTE — Telephone Encounter (Signed)
Lvm for pt to give office a call back in regards to labs  see message below

## 2023-05-11 NOTE — Telephone Encounter (Signed)
-----   Message from [place] sent at 05/09/2023  8:29 AM EDT ----- Notify - overall sugar control increased from previous checks.  A1c 8.4.  confirm taking farxiga 10mg  daily, metformin one tablet bid and ozempic 1mg  weekly.  Confirm tolerating the 1mg  dose.  If so, I would like to increase to 2mg  weekly.  See if agreeable to CGM (if he does not have).  Also, would like to schedule him to follow along with pharmacy.  Triglycerides increased some from last check and good cholesterol decreased. Low carb diet and exercise.  Exercise will increase good cholesterol. Liver function tests wnl.

## 2023-05-13 ENCOUNTER — Telehealth: Payer: Self-pay

## 2023-05-13 NOTE — Telephone Encounter (Signed)
-----   Message from [place] sent at 05/09/2023  8:29 AM EDT ----- Notify - overall sugar control increased from previous checks.  A1c 8.4.  confirm taking farxiga 10mg  daily, metformin one tablet bid and ozempic 1mg  weekly.  Confirm tolerating the 1mg  dose.  If so, I would like to increase to 2mg  weekly.  See if agreeable to CGM (if he does not have).  Also, would like to schedule him to follow along with pharmacy.  Triglycerides increased some from last check and good cholesterol decreased. Low carb diet and exercise.  Exercise will increase good cholesterol. Liver function tests wnl.

## 2023-05-25 ENCOUNTER — Encounter: Payer: Self-pay | Admitting: Student in an Organized Health Care Education/Training Program

## 2023-06-02 ENCOUNTER — Institutional Professional Consult (permissible substitution): Payer: 59 | Admitting: Primary Care

## 2023-06-22 ENCOUNTER — Encounter: Payer: Self-pay | Admitting: Internal Medicine

## 2023-06-30 NOTE — Telephone Encounter (Signed)
Patients wife is aware of results. He is taking the metformin and farxiga as directed. Patient does not take Ozempic regularly. Will restart. Does not wear his CGM. They were on a cruise when I talked with them. Will get back on regular med regimen once they return home.
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
## 2023-07-09 ENCOUNTER — Ambulatory Visit: Payer: 59 | Admitting: Psychiatry

## 2023-07-16 ENCOUNTER — Other Ambulatory Visit: Payer: Self-pay | Admitting: Psychiatry

## 2023-07-16 NOTE — Telephone Encounter (Signed)
Message left to call and schedule.

## 2023-07-16 NOTE — Telephone Encounter (Signed)
 Could you contact the patient to schedule follow up (in person is preferred, but virtual is fine). Will plan to fill refill until the next visit

## 2023-07-17 ENCOUNTER — Telehealth: Payer: Self-pay

## 2023-07-17 NOTE — Telephone Encounter (Addendum)
 Patient is scheduled to see Dr. Belia Heman Monday for a sleep consult. Lm x1 to see if patient is currently wearing a CPAP machine and who his DME company is.

## 2023-07-20 ENCOUNTER — Ambulatory Visit: Payer: 59 | Admitting: Internal Medicine

## 2023-07-20 ENCOUNTER — Encounter: Payer: Self-pay | Admitting: Internal Medicine

## 2023-07-20 VITALS — BP 110/80 | HR 84 | Temp 97.6°F | Ht 73.0 in | Wt 245.4 lb

## 2023-07-20 DIAGNOSIS — G4733 Obstructive sleep apnea (adult) (pediatric): Secondary | ICD-10-CM | POA: Diagnosis not present

## 2023-07-20 DIAGNOSIS — E669 Obesity, unspecified: Secondary | ICD-10-CM

## 2023-07-20 DIAGNOSIS — R0683 Snoring: Secondary | ICD-10-CM

## 2023-07-20 DIAGNOSIS — G4719 Other hypersomnia: Secondary | ICD-10-CM

## 2023-07-20 DIAGNOSIS — Z6832 Body mass index (BMI) 32.0-32.9, adult: Secondary | ICD-10-CM

## 2023-07-20 NOTE — Telephone Encounter (Signed)
The patient was seen in the office today.  Nothing further needed. 

## 2023-07-20 NOTE — Progress Notes (Signed)
 Name: Dalton [name] MRN: [id number] DOB: October 24, 1965    CHIEF COMPLAINT:  EXCESSIVE DAYTIME SLEEPINESS Assessment of OSA  HISTORY OF PRESENT ILLNESS: Patient is seen today for problems and issues with sleep related to excessive daytime sleepiness Patient  has been having sleep problems for many years Patient has been having excessive daytime sleepiness for a long time Patient has been having extreme fatigue and tiredness, lack of energy +  very Loud snoring every night + struggling breathe at night and gasps for air + morning headaches + Nonrefreshing sleep  Discussed sleep data and reviewed with patient.  Encouraged proper weight management.  Discussed driving precautions and its relationship with hypersomnolence.  Discussed operating dangerous equipment and its relationship with hypersomnolence.  Discussed sleep hygiene, and benefits of a fixed sleep waked time.  The importance of getting eight or more hours of sleep discussed with patient.  Discussed limiting the use of the computer and television before bedtime.  Decrease naps during the day, so night time sleep will become enhanced.  Limit caffeine, and sleep deprivation.  HTN, stroke, and heart failure are potential risk factors.    Patient diagnosed with sleep apnea in 2017 Ever since then he has been on CPAP therapy  CPAP download reviewed 07/2023 Excellent compliance report 100% for days 100% for greater than 4 hours Auto CPAP 4-18 AHI reduced to 1.8 Sleep study 2017 showed AHI of 67 significant for severe sleep apnea  Patient is having a hard time with mask leak and mask issues Patient states that he has increased leakage Patient currently at 245 pounds Lost about 40 pounds since his heart surgery in 2022 for him aortic valve replacement       PAST MEDICAL HISTORY :   has a past medical history of Aortic valve stenosis, Arthritis, CAD (coronary artery disease), Carotid artery disease (HCC), Depression,  Diabetes mellitus without complication (HCC), Dilated aortic root (HCC), Dyspnea, Dysrhythmia, Environmental allergies, Headache, Heart murmur, Hypercholesterolemia, Hypertension, Obesity (BMI 30-39.9) (04/26/2018), PTSD (post-traumatic stress disorder), Sleep apnea, and SVT (supraventricular tachycardia) (HCC).  has a past surgical history that includes Tonsillectomy; surgery for non descending testicle; Cardiac electrophysiology study and ablation (08/21/2011); supraventricular tachycardia ablation (N/A, 08/21/2011); Septoplasty (N/A, 03/22/2015); Esophagogastroduodenoscopy (egd) with propofol  (N/A, 06/26/2017); Colonoscopy with propofol  (N/A, 06/26/2017); RIGHT/LEFT HEART CATH AND CORONARY ANGIOGRAPHY (N/A, 11/16/2019); TEE without cardioversion (N/A, 11/16/2019); Aortic valve replacement (N/A, 10/04/2020); MAZE (N/A, 10/04/2020); and TEE without cardioversion (N/A, 10/04/2020). Prior to Admission medications   Medication Sig Start Date End Date Taking? Authorizing Provider  acetaminophen  (TYLENOL ) 500 MG tablet Take 1,000 mg by mouth every 6 (six) hours as needed for moderate pain or headache.    [provider]  apixaban  (ELIQUIS ) 5 MG TABS tablet Take 1 tablet (5 mg total) by mouth 2 (two) times daily. 01/19/23   Gollan, Timothy J, MD  atorvastatin  (LIPITOR) 40 MG tablet TAKE 1 TABLET(40 MG) BY MOUTH DAILY 01/19/23   Gollan, Timothy J, MD  buPROPion  (WELLBUTRIN  XL) 300 MG 24 hr tablet Take 1 tablet (300 mg total) by mouth daily. 06/02/23 11/29/23  Vickey Mettle, MD  cetirizine (ZYRTEC) 10 MG tablet Take 10 mg by mouth 2 (two) times daily.     [provider]  CONTOUR NEXT TEST test strip USE AS DIRECTED TO CHECK BLOOD SUGARS TWICE DAILY 05/10/21   Glendia Shad, MD  dapagliflozin  propanediol (FARXIGA ) 10 MG TABS tablet TAKE 1 TABLET(10 MG) BY MOUTH DAILY 04/15/23   Glendia Shad, MD  digoxin  (LANOXIN )  0.125 MG tablet Take 1 tablet (0.125 mg total) by mouth daily. 01/19/23   Gollan, Timothy  J, MD  fluticasone  (FLONASE ) 50 MCG/ACT nasal spray SHAKE LIQUID AND USE 2 SPRAYS IN EACH NOSTRIL DAILY AS NEEDED FOR ALLERGIES OR RHINITIS 04/15/23   Glendia Shad, MD  Lancets [facility] DELICA PLUS [place]) MISC USE TWICE DAILY AS DIRECTED 07/04/19   Glendia Shad, MD  metFORMIN  (GLUCOPHAGE ) 500 MG tablet TAKE 1 TABLET(500 MG) BY MOUTH TWICE DAILY 04/15/23   Glendia Shad, MD  metoprolol  succinate (TOPROL -XL) 25 MG 24 hr tablet TAKE 1 TABLET(25 MG) BY MOUTH DAILY 01/19/23   Gollan, Timothy J, MD  NON FORMULARY as directed. CPAP    [provider]  QUEtiapine  (SEROQUEL ) 100 MG tablet Take 1.5 tablets (150 mg total) by mouth at bedtime. 06/17/23 09/15/23  Vickey Mettle, MD  sacubitril -valsartan  (ENTRESTO ) 24-26 MG Take 1 tablet by mouth 2 (two) times daily. 01/19/23   Gollan, Timothy J, MD  Semaglutide , 1 MG/DOSE, (OZEMPIC , 1 MG/DOSE,) 4 MG/3ML SOPN INJECT 1 MG INTO THE SKIN EVERY WEEK 04/15/23   Glendia Shad, MD  sertraline  (ZOLOFT ) 100 MG tablet Take 1 tablet (100 mg total) by mouth at bedtime. 07/23/23 08/22/23  Vickey Mettle, MD  sertraline  (ZOLOFT ) 25 MG tablet Take 1 tablet (25 mg total) by mouth at bedtime for 7 days, THEN 2 tablets (50 mg total) at bedtime for 7 days. 05/11/23 05/25/23  Vickey Mettle, MD   No Known Allergies  FAMILY HISTORY:  family history includes Aortic stenosis in his maternal grandfather; Arthritis in his mother; Breast cancer in his maternal grandmother; Cancer in his father and maternal grandfather; Diabetes in his mother; Heart attack in his father; Heart disease in his mother; Kidney disease in his father. SOCIAL HISTORY:  reports that he quit smoking about 34 years ago. His smoking use included cigarettes. He has quit using smokeless tobacco. He reports current alcohol use. He reports that he does not currently use drugs.   Review of Systems:  Gen:  Denies  fever, sweats, chills weight loss  HEENT: Denies blurred vision, double vision, ear pain,  eye pain, hearing loss, nose bleeds, sore throat Cardiac:  No dizziness, chest pain or heaviness, chest tightness,edema, No JVD Resp:   No cough, -sputum production, -shortness of breath,-wheezing, -hemoptysis,  Gi: Denies swallowing difficulty, stomach pain, nausea or vomiting, diarrhea, constipation, bowel incontinence Gu:  Denies bladder incontinence, burning urine Ext:   Denies Joint pain, stiffness or swelling Skin: Denies  skin rash, easy bruising or bleeding or hives Endoc:  Denies polyuria, polydipsia , polyphagia or weight change Psych:   Denies depression, insomnia or hallucinations  Other:  All other systems negative   ALL OTHER ROS ARE NEGATIVE BP 110/80 (BP Location: Left Arm, Patient Position: Sitting, Cuff Size: Normal)   Pulse 84   Temp 97.6 F (36.4 C) (Temporal)   Ht 6' 1 (1.854 m)   Wt 245 lb 6.4 oz (111.3 kg)   SpO2 97%   BMI 32.38 kg/m     Physical Examination:   General Appearance: No distress  EYES PERRLA, EOM intact.   NECK Supple, No JVD Pulmonary: normal breath sounds, No wheezing.  CardiovascularNormal S1,S2.  No m/r/g.   Abdomen: Benign, Soft, non-tender. Skin:   warm, no rashes, no ecchymosis  Extremities: normal, no cyanosis, clubbing. Neuro:without focal findings,  speech normal  PSYCHIATRIC: Mood, affect within normal limits.   ALL OTHER ROS ARE NEGATIVE    ASSESSMENT AND PLAN SYNOPSIS  58 year old  white male seen today for  assessment for OSA previous sleep study in 2017 diagnosed with OSA started on CPAP therapy  OSA Compliance report reviewed in detail with patient Excellent compliance report Patient having issues with mask Recommend mask referral program to [facility] adjust and prescribe auto CPAP 4-12   Obesity -recommend significant weight loss -recommend changing diet  Deconditioned state -Recommend increased daily activity and exercise   MEDICATION ADJUSTMENTS/LABS AND TESTS ORDERED: Recommend changing  auto CPAP 4-12 cm h20 Recommend mask fitting referral services in [facility]  CURRENT MEDICATIONS REVIEWED AT LENGTH WITH PATIENT TODAY   Patient  satisfied with Plan of action and management. All questions answered  Follow up 1 month  Total Time Spent  45 mins   Nickolas Alm Cellar, M.D.  [facility]  Medical Director [facility] Medical Director [facility] Cardio-Pulmonary Department

## 2023-07-20 NOTE — Patient Instructions (Signed)
 Recommend changing auto CPAP 4-12 Recommend mask fitting referral services in [place]  Excellent job with CPAP A+!!!!   Avoid Allergens and Irritants Avoid secondhand smoke Avoid SICK contacts Recommend  Masking  when appropriate Recommend Keep up-to-date with vaccinations

## 2023-07-29 ENCOUNTER — Other Ambulatory Visit (HOSPITAL_BASED_OUTPATIENT_CLINIC_OR_DEPARTMENT_OTHER): Payer: 59

## 2023-08-07 ENCOUNTER — Ambulatory Visit: Payer: 59 | Admitting: Internal Medicine

## 2023-08-07 ENCOUNTER — Encounter: Payer: Self-pay | Admitting: Internal Medicine

## 2023-08-07 VITALS — BP 118/70 | HR 77 | Temp 98.3°F | Resp 16 | Ht 73.0 in | Wt 244.0 lb

## 2023-08-07 DIAGNOSIS — E785 Hyperlipidemia, unspecified: Secondary | ICD-10-CM

## 2023-08-07 DIAGNOSIS — I5042 Chronic combined systolic (congestive) and diastolic (congestive) heart failure: Secondary | ICD-10-CM | POA: Diagnosis not present

## 2023-08-07 DIAGNOSIS — I4891 Unspecified atrial fibrillation: Secondary | ICD-10-CM | POA: Diagnosis not present

## 2023-08-07 DIAGNOSIS — F4310 Post-traumatic stress disorder, unspecified: Secondary | ICD-10-CM

## 2023-08-07 DIAGNOSIS — E669 Obesity, unspecified: Secondary | ICD-10-CM

## 2023-08-07 DIAGNOSIS — E1169 Type 2 diabetes mellitus with other specified complication: Secondary | ICD-10-CM

## 2023-08-07 DIAGNOSIS — I10 Essential (primary) hypertension: Secondary | ICD-10-CM

## 2023-08-07 DIAGNOSIS — Z7984 Long term (current) use of oral hypoglycemic drugs: Secondary | ICD-10-CM

## 2023-08-07 DIAGNOSIS — E1165 Type 2 diabetes mellitus with hyperglycemia: Secondary | ICD-10-CM | POA: Diagnosis not present

## 2023-08-07 DIAGNOSIS — I152 Hypertension secondary to endocrine disorders: Secondary | ICD-10-CM

## 2023-08-07 DIAGNOSIS — E1159 Type 2 diabetes mellitus with other circulatory complications: Secondary | ICD-10-CM

## 2023-08-07 NOTE — Assessment & Plan Note (Signed)
Continue diet, exercise and weight loss.  Low carb diet. Follow.

## 2023-08-07 NOTE — Assessment & Plan Note (Signed)
Should be on metoprolol, entrestro, farxiga, eliquis and digoxin. Had f/u echo 02/18/23 - EF 40-45% with normal RV size and function  no significant valvular heart disease.

## 2023-08-07 NOTE — Assessment & Plan Note (Signed)
On eliquis, digoxin and metoprolol.  Followed by cardiology.  Stable. Recheck digoxin level next labs.

## 2023-08-07 NOTE — Assessment & Plan Note (Signed)
Blood pressure doing well.  Continue entresto and metoprolol.  Follow pressures.  Follow metabolic panel.

## 2023-08-07 NOTE — Assessment & Plan Note (Signed)
Low cholesterol diet and exercise.  On lipitor.  Follow lipid panel and liver function tests.   

## 2023-08-07 NOTE — Assessment & Plan Note (Signed)
Off ozempic. Should be on metformin and farxiga.  Low carb diet and exercise.  Follow met b and a1c.  Schedule for fasting labs.  Clarify medications. Also refer for pt assistance.

## 2023-08-07 NOTE — Progress Notes (Signed)
Subjective:    Patient ID: Dalton [name], male    DOB: Apr 25, 1966, 58 y.o.   MRN: [id number]  Patient here for  Chief Complaint  Patient presents with   Medical Management of Chronic Issues    HPI Here for a scheduled follow up. 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 Continues on metoprolol, entrestro, farxiga, eliquis and digoxin. Had f/u echo 02/18/23 - EF 40-45% with normal RV size and function  no significant valvular heart disease. Feels from a cardiac standpoint - stable. Seeing Dr Valentino Nose for f/u PTSD, depression and anxiety. Continues on quetiapine, citalopram and bupropion. Taking farxiga, metformin and ozempic. Saw pulmonary 07/20/23 - f/u OSA. Recommended mask referral program and prescribed auto cpap 4-12. Also recommended diet and weight loss. He has to reschedule his appt for mask fitting. No increased cough or congestion. No abdominal pain. Bowels moving. Off ozempic. Felt caused his blood sugar to drop. Not sure of his other medication he is taking. Discussed the need to take his medications regularly.  Overdue colonoscopy. Rescheduled to April. Discussed f/u regarding thyroid and f/u thyroid nodules. Previous ultrasound and biopsy - biopsy ok. Wants to hold on f/u at this time.    Past Medical History:  Diagnosis Date   Aortic valve stenosis    a. Bicuspid AV - 2D Echo 06/2014 - mod AS, mild AI, mildly dilated aortic root.   Arthritis    Neck   CAD (coronary artery disease)    Carotid artery disease (HCC)    a. Mild by duplex 05/2015 - 1-39% BICA. Repeat due 05/2017.   Depression    Diabetes mellitus without complication (HCC)    Dilated aortic root (HCC)    a. By echo 06/2014.   Dyspnea    with exercion   Dysrhythmia    Atrial Fibrilation   Environmental allergies    Headache    MIgraines- rare   Heart murmur    Hypercholesterolemia    Hypertension    Obesity (BMI 30-39.9) 04/26/2018   PTSD  (post-traumatic stress disorder)    Sleep apnea    CPAP   SVT (supraventricular tachycardia) (HCC)    a. h/o SVT - Ablation done 2013.   Past Surgical History:  Procedure Laterality Date   AORTIC VALVE REPLACEMENT N/A 10/04/2020   Procedure: AORTIC VALVE REPLACEMENT (AVR);  Surgeon: Alleen Borne, MD;  Location: [facility] OR;  Service: Open Heart Surgery;  Laterality: N/A;   CARDIAC ELECTROPHYSIOLOGY STUDY AND ABLATION  08/21/2011   COLONOSCOPY WITH PROPOFOL N/A 06/26/2017   Procedure: COLONOSCOPY WITH PROPOFOL;  Surgeon: Scot Jun, MD;  Location: [facility] ENDOSCOPY;  Service: Endoscopy;  Laterality: N/A;   ESOPHAGOGASTRODUODENOSCOPY (EGD) WITH PROPOFOL N/A 06/26/2017   Procedure: ESOPHAGOGASTRODUODENOSCOPY (EGD) WITH PROPOFOL;  Surgeon: Scot Jun, MD;  Location: [facility] ENDOSCOPY;  Service: Endoscopy;  Laterality: N/A;   MAZE N/A 10/04/2020   Procedure: MAZE;  Surgeon: Alleen Borne, MD;  Location: [facility] OR;  Service: Open Heart Surgery;  Laterality: N/A;   RIGHT/LEFT HEART CATH AND CORONARY ANGIOGRAPHY N/A 11/16/2019   Procedure: RIGHT/LEFT HEART CATH AND CORONARY ANGIOGRAPHY;  Surgeon: Lyn Records, MD;  Location: [facility] INVASIVE CV LAB;  Service: Cardiovascular;  Laterality: N/A;   SEPTOPLASTY N/A 03/22/2015   Procedure: SEPTOPLASTY  WITH RIGHT INFERIOR TURBINATE REDUCTION;  Surgeon: Vernie Murders, MD;  Location: [facility] SURGERY CNTR;  Service: ENT;  Laterality: N/A;   SUPRAVENTRICULAR TACHYCARDIA ABLATION N/A  08/21/2011   Procedure: SUPRAVENTRICULAR TACHYCARDIA ABLATION;  Surgeon: Marinus Maw, MD;  Location[facility] CATH LAB;  Service: Cardiovascular;  Laterality: N/A;   surgery for non descending testicle     TEE WITHOUT CARDIOVERSION N/A 11/16/2019   Procedure: TRANSESOPHAGEAL ECHOCARDIOGRAM (TEE);  Surgeon: Sande Zidek, MD;  Location: [facility] ENDOSCOPY;  Service: Cardiovascular;  Laterality: N/A;   TEE WITHOUT CARDIOVERSION N/A 10/04/2020   Procedure: TRANSESOPHAGEAL ECHOCARDIOGRAM  (TEE);  Surgeon: Alleen Borne, MD;  Location: [facility] OR;  Service: Open Heart Surgery;  Laterality: N/A;   TONSILLECTOMY     Family History  Problem Relation Age of Onset   Diabetes Mother    Heart disease Mother    Arthritis Mother    Heart attack Father    Kidney disease Father    Cancer Father    Breast cancer Maternal Grandmother    Cancer Maternal Grandfather    Aortic stenosis Maternal Grandfather    Social History   Socioeconomic History   Marital status: Married    Spouse name: Not on file   Number of children: 2   Years of education: Not on file   Highest education level: 12th grade  Occupational History   Occupation: truck Air traffic controller: Office manager  Tobacco Use   Smoking status: Former    Current packs/day: 0.00    Types: Cigarettes    Quit date: 11/04/1988    Years since quitting: 34.7   Smokeless tobacco: Former  Building services engineer status: Never Used  Substance and Sexual Activity   Alcohol use: Yes    Comment: occasionally   Drug use: Not Currently    Comment: many years ago in highschool   Sexual activity: Not Currently  Other Topics Concern   Not on file  Social History Narrative   Very rare exercise   Social Drivers of Health   Financial Resource Strain: High Risk (08/05/2023)   Overall Financial Resource Strain (CARDIA)    Difficulty of Paying Living Expenses: Hard  Food Insecurity: Patient Declined (08/05/2023)   Hunger Vital Sign    Worried About Running Out of Food in the Last Year: Patient declined    Ran Out of Food in the Last Year: Patient declined  Transportation Needs: No Transportation Needs (08/05/2023)   PRAPARE - Administrator, Civil Service (Medical): No    Lack of Transportation (Non-Medical): No  Physical Activity: Patient Declined (08/05/2023)   Exercise Vital Sign    Days of Exercise per Week: Patient declined    Minutes of Exercise per Session: Patient declined  Stress: No Stress Concern Present  (08/05/2023)   [employer] of Occupational Health - Occupational Stress Questionnaire    Feeling of Stress : Only a little  Social Connections: Unknown (08/05/2023)   Social Connection and Isolation Panel [NHANES]    Frequency of Communication with Friends and Family: More than three times a week    Frequency of Social Gatherings with Friends and Family: Patient declined    Attends Religious Services: Patient declined    Database administrator or Organizations: Yes    Attends Banker Meetings: Patient declined    Marital Status: Married     Review of Systems  Constitutional:  Negative for appetite change and unexpected weight change.  HENT:  Negative for congestion and sinus pressure.   Respiratory:  Negative for cough, chest tightness and shortness of breath.   Cardiovascular:  Negative for chest pain, palpitations  and leg swelling.  Gastrointestinal:  Negative for abdominal pain, diarrhea, nausea and vomiting.  Genitourinary:  Negative for difficulty urinating and dysuria.  Musculoskeletal:  Negative for joint swelling and myalgias.  Skin:  Negative for color change and rash.  Neurological:  Negative for dizziness and headaches.  Psychiatric/Behavioral:  Negative for agitation and dysphoric mood.        Objective:     BP 118/70   Pulse 77   Temp 98.3 F (36.8 C)   Resp 16   Ht 6\' 1"  (1.854 m)   Wt 244 lb (110.7 kg)   SpO2 98%   BMI 32.19 kg/m  Wt Readings from Last 3 Encounters:  08/07/23 244 lb (110.7 kg)  07/20/23 245 lb 6.4 oz (111.3 kg)  05/07/23 265 lb (120.2 kg)    Physical Exam Vitals reviewed.  Constitutional:      General: He is not in acute distress.    Appearance: Normal appearance. He is well-developed.  HENT:     Head: Normocephalic and atraumatic.     Right Ear: External ear normal.     Left Ear: External ear normal.     Mouth/Throat:     Pharynx: No oropharyngeal exudate or posterior oropharyngeal erythema.  Eyes:      General: No scleral icterus.       Right eye: No discharge.        Left eye: No discharge.     Conjunctiva/sclera: Conjunctivae normal.  Cardiovascular:     Rate and Rhythm: Normal rate and regular rhythm.  Pulmonary:     Effort: Pulmonary effort is normal. No respiratory distress.     Breath sounds: Normal breath sounds.  Abdominal:     General: Bowel sounds are normal.     Palpations: Abdomen is soft.     Tenderness: There is no abdominal tenderness.  Musculoskeletal:        General: No swelling or tenderness.     Cervical back: Neck supple. No tenderness.  Lymphadenopathy:     Cervical: No cervical adenopathy.  Skin:    Findings: No erythema or rash.  Neurological:     Mental Status: He is alert.  Psychiatric:        Mood and Affect: Mood normal.        Behavior: Behavior normal.         Outpatient Encounter Medications as of 08/07/2023  Medication Sig   acetaminophen (TYLENOL) 500 MG tablet Take 1,000 mg by mouth every 6 (six) hours as needed for moderate pain or headache.   apixaban (ELIQUIS) 5 MG TABS tablet Take 1 tablet (5 mg total) by mouth 2 (two) times daily.   atorvastatin (LIPITOR) 40 MG tablet TAKE 1 TABLET(40 MG) BY MOUTH DAILY   buPROPion (WELLBUTRIN XL) 300 MG 24 hr tablet Take 1 tablet (300 mg total) by mouth daily.   cetirizine (ZYRTEC) 10 MG tablet Take 10 mg by mouth 2 (two) times daily.    CONTOUR NEXT TEST test strip USE AS DIRECTED TO CHECK BLOOD SUGARS TWICE DAILY   dapagliflozin propanediol (FARXIGA) 10 MG TABS tablet TAKE 1 TABLET(10 MG) BY MOUTH DAILY   digoxin (LANOXIN) 0.125 MG tablet Take 1 tablet (0.125 mg total) by mouth daily.   fluticasone (FLONASE) 50 MCG/ACT nasal spray SHAKE LIQUID AND USE 2 SPRAYS IN EACH NOSTRIL DAILY AS NEEDED FOR ALLERGIES OR RHINITIS   Lancets (ONETOUCH DELICA PLUS LANCET33G) MISC USE TWICE DAILY AS DIRECTED   metFORMIN (GLUCOPHAGE) 500 MG tablet TAKE 1  TABLET(500 MG) BY MOUTH TWICE DAILY   metoprolol succinate  (TOPROL-XL) 25 MG 24 hr tablet TAKE 1 TABLET(25 MG) BY MOUTH DAILY   NON FORMULARY as directed. CPAP   QUEtiapine (SEROQUEL) 100 MG tablet Take 1.5 tablets (150 mg total) by mouth at bedtime.   sacubitril-valsartan (ENTRESTO) 24-26 MG Take 1 tablet by mouth 2 (two) times daily.   sertraline (ZOLOFT) 100 MG tablet Take 1 tablet (100 mg total) by mouth at bedtime.   sertraline (ZOLOFT) 25 MG tablet Take 1 tablet (25 mg total) by mouth at bedtime for 7 days, THEN 2 tablets (50 mg total) at bedtime for 7 days.   [DISCONTINUED] Semaglutide, 1 MG/DOSE, (OZEMPIC, 1 MG/DOSE,) 4 MG/3ML SOPN INJECT 1 MG INTO THE SKIN EVERY WEEK   No facility-administered encounter medications on file as of 08/07/2023.     Lab Results  Component Value Date   WBC 5.9 02/02/2023   HGB 16.7 02/02/2023   HCT 50.4 02/02/2023   PLT 158.0 02/02/2023   GLUCOSE 155 (H) 05/07/2023   CHOL 117 05/07/2023   TRIG 165.0 (H) 05/07/2023   HDL 31.90 (L) 05/07/2023   LDLDIRECT 75.0 03/12/2022   LDLCALC 53 05/07/2023   ALT 28 05/07/2023   AST 17 05/07/2023   NA 140 05/07/2023   K 4.0 05/07/2023   CL 103 05/07/2023   CREATININE 1.22 05/07/2023   BUN 14 05/07/2023   CO2 26 05/07/2023   TSH 1.49 02/02/2023   PSA 0.81 02/02/2023   INR 1.4 (H) 10/04/2020   HGBA1C 8.4 (H) 05/07/2023   MICROALBUR 6.5 (H) 02/02/2023    EXERCISE TOLERANCE TEST (ETT) Result Date: 02/13/2023   Baseline ECG demonstrates atrial fibrillation without significant ST/T abnormalities.   The patient exhibits good functional capacity with normal heart rate and blood pressure responses.  No angina was reported.   1.5-2 mm downsloping ST depressions and T wave inversions are seen in the inferolateral leads during peak stress.   Rare PVC's occurred during stress.  Atrial fibrillation persisted throughout stress and recovery.   Intermediate risk stress test ([facility] Treadmill Score -1 to +1.5). Intermediate risk exercise tolerance test with 1.5-2 mm downsloping ST  depressions and T wave inversions in the inferolateral leads at peak stress.  Persistent atrial fibrillation noted.       Assessment & Plan:  Type 2 diabetes mellitus with hyperglycemia, without long-term current use of insulin (HCC) Assessment & Plan: Off ozempic. Should be on metformin and farxiga.  Low carb diet and exercise.  Follow met b and a1c.  Schedule for fasting labs.  Clarify medications. Also refer for pt assistance.    Orders: -     Basic metabolic panel; Future -     Hemoglobin A1c; Future -     AMB Referral [facility]BCI Care Management  Hyperlipidemia, unspecified hyperlipidemia type Assessment & Plan: Low cholesterol diet and exercise.  On lipitor.  Follow lipid panel and liver function tests.    Orders: -     Lipid panel; Future -     Hepatic function panel; Future -     TSH; Future -     AMB Referral [facility]BCI Care Management  Atrial fibrillation, unspecified type [facility]) Assessment & Plan: On eliquis, digoxin and metoprolol.  Followed by cardiology.  Stable. Recheck digoxin level next labs.   Orders: -     Digoxin level; Future -     AMB Referral [facility]BCI Care Management  Chronic combined systolic and diastolic heart failure [facility]) Assessment & Plan:  Should be on metoprolol, entrestro, farxiga, eliquis and digoxin. Had f/u echo 02/18/23 - EF 40-45% with normal RV size and function  no significant valvular heart disease.   Essential hypertension Assessment & Plan: Blood pressure doing well.  Continue entresto and metoprolol.  Follow pressures.  Follow metabolic panel.    Obesity, diabetes, and hypertension syndrome (HCC) Assessment & Plan: Continue diet, exercise and weight loss.  Low carb diet. Follow.    PTSD (post-traumatic stress disorder) Assessment & Plan:  Seeing Dr Valentino Nose for f/u PTSD, depression and anxiety. Continues on quetiapine, citalopram and bupropion.  Discussed. Increased stress. Plans to discuss with Dr Valentino Nose next week.        Dale Wilder,  MD

## 2023-08-07 NOTE — Assessment & Plan Note (Signed)
 Seeing Dr Valentino Nose for f/u PTSD, depression and anxiety. Continues on quetiapine, citalopram and bupropion.  Discussed. Increased stress. Plans to discuss with Dr Valentino Nose next week.

## 2023-08-10 ENCOUNTER — Telehealth: Payer: Self-pay

## 2023-08-10 NOTE — Progress Notes (Signed)
Care Guide Pharmacy Note  08/10/2023 Name: Dalton [name] MRN: [id number] DOB: 08/23/1965  Referred By: Dale Amado, MD Reason for referral: Care Coordination (Outreach to schedule with pharm d)   Dalton [name] is a 58 y.o. year old male who is a primary care patient of Dale [place], MD.  Dalton [name] was referred to the pharmacist for assistance related to: DMII  Successful contact was made with the patient to discuss pharmacy services including being ready for the pharmacist to call at least 5 minutes before the scheduled appointment time and to have medication bottles and any blood pressure readings ready for review. The patient agreed to meet with the pharmacist via telephone visit on (date/time).08/11/2023   [name] , RMA     [place]  [facility], [facility] Guide  Direct Dial: [phone]  Website: [email or web address]

## 2023-08-11 ENCOUNTER — Other Ambulatory Visit: Payer: 59 | Admitting: Pharmacist

## 2023-08-11 DIAGNOSIS — I4891 Unspecified atrial fibrillation: Secondary | ICD-10-CM

## 2023-08-11 DIAGNOSIS — I5042 Chronic combined systolic (congestive) and diastolic (congestive) heart failure: Secondary | ICD-10-CM

## 2023-08-11 NOTE — Progress Notes (Signed)
   08/11/2023 Name: Dalton [name] MRN: [id number] DOB: April 15, 1966  Subjective  Chief Complaint  Patient presents with   Medication access    Care Team: Primary Care Provider: Glendia Shad, MD  Reason for visit: ?  Dalton [name] is a 58 y.o. male who presents today with his daughter for a telephone visit with the pharmacist due to medication access concerns. Daughter provided all information for this encounter.  On entresto , farxiga , eliquis . Unable to afford all of these medications without discount card and having trouble getting them through the pharmacy. Needs to apply for pt assistance.   Medication Access: ?  Prescription drug coverage: Payor: ADVERTISING COPYWRITER / Plan: [employer] OTHER / Product Type: *No Product type* / .   Reports that all medications are not affordable.  Has been using discount cards which does help bring the cost of brand-name medications to a reasonable price.   However, is filling medications current at a small independent pharmacy who has told her the insurance is not reimbursing them appropriately, and therefore they are losing money filling these prescriptions and will not be able to continue long term.  Reports that the this pharmacy has also had trouble running the Farxiga  copay card specifically  Current Patient Assistance:  None  Assessment and Plan:   1. Medication Access Medications are affordable through copay cards, though issues with billing at the pharmacy level has become a challenge.  Patient agreeable to transitioning prescription medications to an in-network pharmacy which should resolve the reimbursement issue through his prescription insurance. They plan to try [facility] mail order pharmacy.  Reviewed copay cards for each brand medication. Daughter reports all cards are up to date and active. Has no further questions.   Provided daughter with pharmacist Direct line for questions/concerns with medications or medication costs s/p  changing pharmacies.   Future Appointments  Date Time Provider Department Center  08/21/2023  9:45 AM [facility]  09/04/2023  8:30 AM Isaiah Scrivener, MD [facility] None  12/08/2023  7:00 AM Glendia Shad, MD [facility]    Dalton [name], PharmD Clinical Pharmacist [facility] [phone]

## 2023-08-14 ENCOUNTER — Encounter: Payer: Self-pay | Admitting: Internal Medicine

## 2023-08-17 NOTE — Telephone Encounter (Signed)
 Noted. Does he need rx's sent in now?

## 2023-08-19 ENCOUNTER — Telehealth: Payer: Self-pay | Admitting: Cardiovascular Disease

## 2023-08-19 MED ORDER — DIGOXIN 125 MCG PO TABS: 0.1250 mg | ORAL_TABLET | Freq: Every day | ORAL | 0 refills | Status: DC

## 2023-08-19 NOTE — Telephone Encounter (Signed)
*  STAT* If patient is at the pharmacy, call can be transferred to refill team.   1. Which medications need to be refilled? (please list name of each medication and dose if known) digoxin (LANOXIN) 0.125 MG tablet   2. Which pharmacy/location (including street and city if local pharmacy) is medication to be sent to? [facility] - [place], [place] - [address]   3. Do they need a 30 day or 90 day supply? 90

## 2023-08-19 NOTE — Telephone Encounter (Signed)
Requested Prescriptions   Signed Prescriptions Disp Refills   digoxin (LANOXIN) 0.125 MG tablet 90 tablet 0    Sig: Take 1 tablet (0.125 mg total) by mouth daily.    Authorizing Provider: Antonieta Iba    Ordering User: Guerry Minors

## 2023-08-20 ENCOUNTER — Telehealth: Payer: Self-pay | Admitting: Cardiovascular Disease

## 2023-08-20 NOTE — Telephone Encounter (Signed)
[facility] called to get MD approval that it is okay to switch manufacturers for Digoxin. Manufacturer is switching to [employer] number: [id number]

## 2023-08-21 ENCOUNTER — Other Ambulatory Visit: Payer: 59

## 2023-08-21 DIAGNOSIS — E1165 Type 2 diabetes mellitus with hyperglycemia: Secondary | ICD-10-CM | POA: Diagnosis not present

## 2023-08-21 DIAGNOSIS — E785 Hyperlipidemia, unspecified: Secondary | ICD-10-CM

## 2023-08-21 DIAGNOSIS — I4891 Unspecified atrial fibrillation: Secondary | ICD-10-CM

## 2023-08-21 LAB — HEPATIC FUNCTION PANEL
ALT: 30 U/L (ref 0–53)
AST: 18 U/L (ref 0–37)
Albumin: 4.6 g/dL (ref 3.5–5.2)
Alkaline Phosphatase: 94 U/L (ref 39–117)
Bilirubin, Direct: 0.1 mg/dL (ref 0.0–0.3)
Total Bilirubin: 0.9 mg/dL (ref 0.2–1.2)
Total Protein: 6.8 g/dL (ref 6.0–8.3)

## 2023-08-21 LAB — BASIC METABOLIC PANEL
BUN: 10 mg/dL (ref 6–23)
CO2: 30 meq/L (ref 19–32)
Calcium: 9.4 mg/dL (ref 8.4–10.5)
Chloride: 101 meq/L (ref 96–112)
Creatinine, Ser: 1.05 mg/dL (ref 0.40–1.50)
GFR: 78.57 mL/min (ref >60.00)
Glucose, Bld: 190 mg/dL — ABNORMAL HIGH (ref 70–99)
Potassium: 5 meq/L (ref 3.5–5.1)
Sodium: 140 meq/L (ref 135–145)

## 2023-08-21 LAB — LIPID PANEL
Cholesterol: 132 mg/dL (ref 0–200)
HDL: 33.9 mg/dL — ABNORMAL LOW (ref >39.00)
LDL Cholesterol: 56 mg/dL (ref 0–99)
NonHDL: 98.4
Total CHOL/HDL Ratio: 4
Triglycerides: 210 mg/dL — ABNORMAL HIGH (ref 0.0–149.0)
VLDL: 42 mg/dL — ABNORMAL HIGH (ref 0.0–40.0)

## 2023-08-21 LAB — TSH: TSH: 1.63 u[IU]/mL (ref 0.35–5.50)

## 2023-08-21 LAB — HEMOGLOBIN A1C: Hgb A1c MFr Bld: 8.1 % — ABNORMAL HIGH (ref 4.6–6.5)

## 2023-08-21 NOTE — Telephone Encounter (Signed)
Pharmacy notified of the following from Dr. Mariah Milling.  Okay to switch Thx TG

## 2023-08-22 LAB — DIGOXIN LEVEL: Digoxin Level: 0.7 ug/L — ABNORMAL LOW (ref 0.8–2.0)

## 2023-09-01 ENCOUNTER — Other Ambulatory Visit: Payer: Self-pay | Admitting: Psychiatry

## 2023-09-01 ENCOUNTER — Other Ambulatory Visit: Payer: Self-pay | Admitting: Internal Medicine

## 2023-09-01 DIAGNOSIS — E1165 Type 2 diabetes mellitus with hyperglycemia: Secondary | ICD-10-CM

## 2023-09-03 ENCOUNTER — Telehealth: Payer: Self-pay | Admitting: Internal Medicine

## 2023-09-03 ENCOUNTER — Encounter: Payer: Self-pay | Admitting: Internal Medicine

## 2023-09-03 NOTE — Telephone Encounter (Signed)
 Duplicate. See mychart message. Nothing further needed.

## 2023-09-03 NOTE — Telephone Encounter (Signed)
 You told him to follow up in a month. He has not had the mask fitting appt. Do you want him to keep this appt for tomorrow?

## 2023-09-03 NOTE — Telephone Encounter (Signed)
 Lupita Leash wife would like to know if patient needs to keep appointment 09/04/2023. States patient did not get fitted for new mask. Lupita Leash phone number is [phone].

## 2023-09-04 ENCOUNTER — Ambulatory Visit: Payer: 59 | Admitting: Internal Medicine

## 2023-09-08 ENCOUNTER — Other Ambulatory Visit (INDEPENDENT_AMBULATORY_CARE_PROVIDER_SITE_OTHER): Payer: Self-pay | Admitting: Pharmacist

## 2023-09-08 DIAGNOSIS — E1165 Type 2 diabetes mellitus with hyperglycemia: Secondary | ICD-10-CM

## 2023-09-08 NOTE — Progress Notes (Signed)
 Called [facility] per patient report of ongoing difficulty with processing copay card for [place]. Spoke with pharmacy and customer support teams. Provided updated copay cards.   Before copay cards, patient's copays thorough insurance:  $225 Entresto 90 day $225 Farxiga 60 day  [facility] COPAY CARD OFFER BIN# [id number] PCN# [id number] GRP# [id number] ID# [id number]   Marcelline Deist order has been expedited. Shipping fee waived (should receive within 1-3 business days once successfully processed)   ENTRESTO $10 COPAY OFFER BIN [id number] PCN [facility] GRP [id number] ID [id number] Maximum annual benefit: $4100     Called patient and spoke with spouse regarding the above. Verbalizes understanding. Should receive an updated copay by end of day from [facility], or can check status online.   Loree Fee, PharmD Clinical Pharmacist [facility] [phone]

## 2023-09-09 ENCOUNTER — Telehealth: Payer: Self-pay

## 2023-09-09 DIAGNOSIS — F4310 Post-traumatic stress disorder, unspecified: Secondary | ICD-10-CM

## 2023-09-09 MED ORDER — SERTRALINE HCL 100 MG PO TABS: 100.0000 mg | ORAL_TABLET | Freq: Every day | ORAL | 0 refills | Status: DC

## 2023-09-09 NOTE — Telephone Encounter (Signed)
 I have sent sertraline short supply to pharmacy at [facility] as requested.

## 2023-09-09 NOTE — Telephone Encounter (Signed)
 Dalton [name] from the front desk states "wife will call back to schedule once he tells her when is good."

## 2023-09-09 NOTE — Telephone Encounter (Signed)
 received fax requesting refills on the sertraline. pt lat seen on 11-4 no future appt set up. I did ask the front desk staff to call pt and set up an appt

## 2023-09-10 NOTE — Telephone Encounter (Signed)
 left another message that rx had been sent . then wife called back told that rx had been sent.

## 2023-09-16 ENCOUNTER — Encounter: Payer: Self-pay | Admitting: Internal Medicine

## 2023-09-16 DIAGNOSIS — G4733 Obstructive sleep apnea (adult) (pediatric): Secondary | ICD-10-CM

## 2023-09-16 NOTE — Telephone Encounter (Signed)
 Synetta Fail, can you provide the patient with the number to schedule his DESENSITIZATION MASK FIT appointment?  Thank you!

## 2023-09-17 NOTE — Telephone Encounter (Signed)
 Patient has been rescheduled to do mask fitting with Marita [place] at the Sleep Lab on 11/03/23 @ 10:00am Currently located in [facility] [address] [place]

## 2023-09-17 NOTE — Telephone Encounter (Signed)
 Yes if we are still needing the patient to do mask fitting the other order has been CXL

## 2023-09-17 NOTE — Telephone Encounter (Signed)
 New order placed

## 2023-09-17 NOTE — Telephone Encounter (Signed)
 Patient was scheduled to do mask fit with Marita [place] at [facility] on 07/29/23 and the patient CXL the appt

## 2023-09-20 NOTE — Progress Notes (Unsigned)
 [facility] MD/PA/NP OP Progress Note  09/22/2023 2:46 PM Dalton [name]  MRN:  [id number]  Chief Complaint:  Chief Complaint  Patient presents with   Follow-up   HPI:  - he is not seen since Nov 2024  This is a follow-up appointment for depression, PTSD and insomnia.  He states that he is struggling at work.  He was called due to concern about his performance at work.  He tends to get irritable easily.  His wife commented that he is sometimes gets irritated for no reason.  He tends to throw things or yell. He denies HI. He tends to zone out during the day, and struggle with concentration.  He ran out of bupropion, quetiapine and sertraline for a month.  Although he is unsure whether sertraline has been helpful, he reports that he was doing very well when he went on a cruise with his family in January.  He brightens up when he sees his grandson daughter at home. He has insomnia.  He attributes this to [employer], stating that he is attending to noise.  He has intrusive thoughts. He has increase in appetite.  He adamantly denies any SI as he does not want to put any negative impact on his grandchildren.   He agrees with the plan as outlined below.   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 He was in [place] after 911, no combat experience Support: wife Household: [name]/his wife  Marital status: married with his wife of six years Number of children: 2 daughters, age 101, 73,  3 grandchild (oldest in [place])  Visit Diagnosis:    ICD-10-CM   1. PTSD  (post-traumatic stress disorder)  F43.10 sertraline (ZOLOFT) 100 MG tablet    2. Moderate episode of recurrent major depressive disorder (HCC)  F33.1     3. Insomnia, unspecified type  G47.00       Past Psychiatric History: Please see initial evaluation for full details. I have reviewed the history. No updates at this time.     Past Medical History:  Past Medical History:  Diagnosis Date   Aortic valve stenosis    a. Bicuspid AV - 2D Echo 06/2014 - mod AS, mild AI, mildly dilated aortic root.   Arthritis    Neck   CAD (coronary artery disease)    Carotid artery disease (HCC)    a. Mild by duplex 05/2015 - 1-39% BICA. Repeat due 05/2017.   Depression    Diabetes mellitus without complication (HCC)    Dilated aortic root (HCC)    a. By echo 06/2014.   Dyspnea    with exercion   Dysrhythmia    Atrial Fibrilation   Environmental allergies    Headache    MIgraines- rare   Heart murmur    Hypercholesterolemia    Hypertension    Obesity (BMI 30-39.9) 04/26/2018   PTSD (post-traumatic stress disorder)    Sleep apnea  CPAP   SVT (supraventricular tachycardia) (HCC)    a. h/o SVT - Ablation done 2013.    Past Surgical History:  Procedure Laterality Date   AORTIC VALVE REPLACEMENT N/A 10/04/2020   Procedure: AORTIC VALVE REPLACEMENT (AVR);  Surgeon: Alleen Borne, MD;  Location[facility] OR;  Service: Open Heart Surgery;  Laterality: N/A;   CARDIAC ELECTROPHYSIOLOGY STUDY AND ABLATION  08/21/2011   COLONOSCOPY WITH PROPOFOL N/A 06/26/2017   Procedure: COLONOSCOPY WITH PROPOFOL;  Surgeon: Scot Jun, MD;  Location: [facility] ENDOSCOPY;  Service: Endoscopy;  Laterality: N/A;   ESOPHAGOGASTRODUODENOSCOPY (EGD) WITH PROPOFOL N/A 06/26/2017   Procedure: ESOPHAGOGASTRODUODENOSCOPY (EGD) WITH PROPOFOL;  Surgeon: Scot Jun, MD;  Location: [facility] ENDOSCOPY;  Service: Endoscopy;  Laterality: N/A;   MAZE N/A 10/04/2020   Procedure: MAZE;  Surgeon: Alleen Borne, MD;  Location: [facility]  OR;  Service: Open Heart Surgery;  Laterality: N/A;   RIGHT/LEFT HEART CATH AND CORONARY ANGIOGRAPHY N/A 11/16/2019   Procedure: RIGHT/LEFT HEART CATH AND CORONARY ANGIOGRAPHY;  Surgeon: Lyn Records, MD;  Location: [facility] INVASIVE CV LAB;  Service: Cardiovascular;  Laterality: N/A;   SEPTOPLASTY N/A 03/22/2015   Procedure: SEPTOPLASTY  WITH RIGHT INFERIOR TURBINATE REDUCTION;  Surgeon: Vernie Murders, MD;  Location: [facility] SURGERY CNTR;  Service: ENT;  Laterality: N/A;   SUPRAVENTRICULAR TACHYCARDIA ABLATION N/A 08/21/2011   Procedure: SUPRAVENTRICULAR TACHYCARDIA ABLATION;  Surgeon: Marinus Maw, MD;  Location: [facility] CATH LAB;  Service: Cardiovascular;  Laterality: N/A;   surgery for non descending testicle     TEE WITHOUT CARDIOVERSION N/A 11/16/2019   Procedure: TRANSESOPHAGEAL ECHOCARDIOGRAM (TEE);  Surgeon: Sande Rominger, MD;  Location: [facility] ENDOSCOPY;  Service: Cardiovascular;  Laterality: N/A;   TEE WITHOUT CARDIOVERSION N/A 10/04/2020   Procedure: TRANSESOPHAGEAL ECHOCARDIOGRAM (TEE);  Surgeon: Alleen Borne, MD;  Location: [facility] OR;  Service: Open Heart Surgery;  Laterality: N/A;   TONSILLECTOMY      Family Psychiatric History: Please see initial evaluation for full details. I have reviewed the history. No updates at this time.     Family History:  Family History  Problem Relation Age of Onset   Diabetes Mother    Heart disease Mother    Arthritis Mother    Heart attack Father    Kidney disease Father    Cancer Father    Breast cancer Maternal Grandmother    Cancer Maternal Grandfather    Aortic stenosis Maternal Grandfather     Social History:  Social History   Socioeconomic History   Marital status: Married    Spouse name: Not on file   Number of children: 2   Years of education: Not on file   Highest education level: 12th grade  Occupational History   Occupation: truck Air traffic controller: Office manager  Tobacco Use   Smoking status: Former    Current packs/day:  0.00    Types: Cigarettes    Quit date: 11/04/1988    Years since quitting: 34.9   Smokeless tobacco: Former  Building services engineer status: Never Used  Substance and Sexual Activity   Alcohol use: Yes    Comment: occasionally   Drug use: Not Currently    Comment: many years ago in highschool   Sexual activity: Not Currently  Other Topics Concern   Not on file  Social History Narrative   Very rare exercise   Social Drivers of Health   Financial Resource Strain: High Risk (08/05/2023)   Overall Physicist, medical Strain (  CARDIA)    Difficulty of Paying Living Expenses: Hard  Food Insecurity: Patient Declined (08/05/2023)   Hunger Vital Sign    Worried About Running Out of Food in the Last Year: Patient declined    Ran Out of Food in the Last Year: Patient declined  Transportation Needs: No Transportation Needs (08/05/2023)   PRAPARE - Administrator, Civil Service (Medical): No    Lack of Transportation (Non-Medical): No  Physical Activity: Patient Declined (08/05/2023)   Exercise Vital Sign    Days of Exercise per Week: Patient declined    Minutes of Exercise per Session: Patient declined  Stress: No Stress Concern Present (08/05/2023)   [employer] of Occupational Health - Occupational Stress Questionnaire    Feeling of Stress : Only a little  Social Connections: Unknown (08/05/2023)   Social Connection and Isolation Panel [NHANES]    Frequency of Communication with Friends and Family: More than three times a week    Frequency of Social Gatherings with Friends and Family: Patient declined    Attends Religious Services: Patient declined    Database administrator or Organizations: Yes    Attends Engineer, structural: Patient declined    Marital Status: Married    Allergies: No Known Allergies  Metabolic Disorder Labs: Lab Results  Component Value Date   HGBA1C 8.1 (H) 08/21/2023   MPG 128.37 09/20/2020   No results found for: "PROLACTIN" Lab  Results  Component Value Date   CHOL 132 08/21/2023   TRIG 210.0 (H) 08/21/2023   HDL 33.90 (L) 08/21/2023   CHOLHDL 4 08/21/2023   VLDL 42.0 (H) 08/21/2023   LDLCALC 56 08/21/2023   LDLCALC 53 05/07/2023   Lab Results  Component Value Date   TSH 1.63 08/21/2023   TSH 1.49 02/02/2023    Therapeutic Level Labs: No results found for: "LITHIUM" No results found for: "VALPROATE" No results found for: "CBMZ"  Current Medications: Current Outpatient Medications  Medication Sig Dispense Refill   acetaminophen (TYLENOL) 500 MG tablet Take 1,000 mg by mouth every 6 (six) hours as needed for moderate pain or headache.     apixaban (ELIQUIS) 5 MG TABS tablet Take 1 tablet (5 mg total) by mouth 2 (two) times daily. 180 tablet 3   atorvastatin (LIPITOR) 40 MG tablet TAKE 1 TABLET(40 MG) BY MOUTH DAILY 90 tablet 3   buPROPion (WELLBUTRIN XL) 300 MG 24 hr tablet Take 1 tablet (300 mg total) by mouth daily. 90 tablet 1   cetirizine (ZYRTEC) 10 MG tablet Take 10 mg by mouth 2 (two) times daily.      CONTOUR NEXT TEST test strip USE AS DIRECTED TO CHECK BLOOD SUGARS TWICE DAILY 100 strip 1   dapagliflozin propanediol (FARXIGA) 10 MG TABS tablet TAKE 1 TABLET(10 MG) BY MOUTH DAILY 90 tablet 1   digoxin (LANOXIN) 0.125 MG tablet Take 1 tablet (0.125 mg total) by mouth daily. 90 tablet 0   fluticasone (FLONASE) 50 MCG/ACT nasal spray SHAKE LIQUID AND USE 2 SPRAYS IN EACH NOSTRIL DAILY AS NEEDED FOR ALLERGIES OR RHINITIS 48 g 1   Lancets (ONETOUCH DELICA PLUS LANCET33G) MISC USE TWICE DAILY AS DIRECTED 100 each 7   metFORMIN (GLUCOPHAGE) 500 MG tablet TAKE 1 TABLET BY MOUTH TWICE  DAILY 180 tablet 3   metoprolol succinate (TOPROL-XL) 25 MG 24 hr tablet TAKE 1 TABLET(25 MG) BY MOUTH DAILY 90 tablet 3   NON FORMULARY as directed. CPAP     sacubitril-valsartan (ENTRESTO) 24-26  MG Take 1 tablet by mouth 2 (two) times daily. 180 tablet 3   QUEtiapine (SEROQUEL) 100 MG tablet Take 1.5 tablets (150 mg  total) by mouth at bedtime. 135 tablet 0   [START ON 10/09/2023] sertraline (ZOLOFT) 100 MG tablet Take 1 tablet (100 mg total) by mouth at bedtime. 90 tablet 0   sertraline (ZOLOFT) 25 MG tablet Take 1 tablet (25 mg total) by mouth at bedtime for 7 days, THEN 2 tablets (50 mg total) at bedtime for 7 days. 21 tablet 0   No current facility-administered medications for this visit.     Musculoskeletal: Strength & Muscle Tone: within normal limits Gait & Station: normal Patient leans: N/A  Psychiatric Specialty Exam: Review of Systems  Psychiatric/Behavioral:  Positive for decreased concentration, dysphoric mood and sleep disturbance. Negative for agitation, behavioral problems, confusion, hallucinations, self-injury and suicidal ideas. The patient is nervous/anxious. The patient is not hyperactive.   All other systems reviewed and are negative.   Blood pressure 138/88, pulse 90, temperature 97.7 F (36.5 C), temperature source Temporal, height 6\' 1"  (1.854 m), weight 250 lb 12.8 oz (113.8 kg), SpO2 99%.Body mass index is 33.09 kg/m.  General Appearance: Well Groomed  Eye Contact:  Good  Speech:  Clear and Coherent  Volume:  Normal  Mood:  Irritable  Affect:  Appropriate, Congruent, and Full Range  Thought Process:  Coherent  Orientation:  Full (Time, Place, and Person)  Thought Content: Logical   Suicidal Thoughts:  No  Homicidal Thoughts:  No  Memory:  Immediate;   Good  Judgement:  Good  Insight:  Good  Psychomotor Activity:  Normal, Normal tone, no rigidity, no resting/postural tremors, no tardive dyskinesia    Concentration:  Concentration: Good and Attention Span: Good  Recall:  Good  Fund of Knowledge: Good  Language: Good  Akathisia:  No  Handed:  Right  AIMS (if indicated): 0   Assets:  Communication Skills Desire for Improvement  ADL's:  Intact  Cognition: WNL  Sleep:  Poor   Screenings: GAD-7    Flowsheet Row Office Visit from 03/19/2023 in [facility] Office Visit from 02/02/2023 in [facility] Office Visit from 07/21/2022 in [facility] Counselor from 07/27/2020 in [facility]  Total GAD-7 Score 10 11 13 13       PHQ2-9    Flowsheet Row Office Visit from 05/11/2023 in [facility] Most recent reading at 05/11/2023 11:09 AM Office Visit from 03/19/2023 in [facility] Most recent reading at 03/19/2023 10:12 AM Office Visit from 02/02/2023 in [facility] Most recent reading at 02/02/2023 11:28 AM Office Visit from 02/02/2023 in [facility] [place] Most recent reading at 02/02/2023  8:01 AM Office Visit from 07/21/2022 in [facility] Most recent reading at 07/21/2022  9:23 AM  PHQ-2 Total Score 4 3 3 3 5   PHQ-9 Total Score 16 14 12 13 19       Flowsheet Row Office Visit from 07/21/2022 in [facility] Counselor from 05/14/2021 in [facility] Counselor from 03/05/2021 in [facility]  C-SSRS RISK CATEGORY Error: Q3, 4, or 5 should not be populated when Q2 is No No Risk Low Risk        Assessment and Plan:  VARIAN INNES is a 58 y.o. 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   1. PTSD (post-traumatic stress disorder) 2. 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  Other stressors including his daughter is moving out from the house.  Although it is unclear whether he had benefit from the change from citalopram to sertraline, he does report having good mood a few months ago, which coincided with him going to a cruise.  We will restart his original medication regimen.  Will restart sertraline to target PTSD, depression along with quetiapine and bupropion as  adjunctive treatment for depression.  He will greatly benefit from CBT/EMDR; will make a referral.  3. Insomnia, unspecified type - on CPAP machine. In the process of communicating with his provider for possible re-evaluation    Worsening.  He will be seen by a sleep specialist for reevaluation.  He has restless leg and fatigue; we will obtain labs.      Last checked  EKG HR 80, QTc467msec Afib 01/2023  Lipid panels LDL 56 07/2023  HbA1c 8.1, on metformin 07/2023     Plan Restart sertraline 100 mg at night  Restart bupropion 300 mg daily Restart quetiapine 150 mg at night - monitor drowsiness  Obtain lab (iron panels) Therapy referral onsite Next appointment: 4/29 at 3 pm, IP - on ozempic, metformin   - consider obtaining ferritin, Vitamin D at the next visit      Past trials of medication: citalopram, lamotrigine, quetiapine, Trazodone,     I have reviewed suicide assessment in detail. No change in the following assessment.    The patient demonstrates the following risk factors for suicide: Chronic risk factors for suicide include: psychiatric disorder of depression, PTSD. Acute risk factors for suicide include: N/A. Protective factors for this  patient include: positive social support, responsibility to others (children, family), coping skills and hope for the future. He is future oriented, and is amenable to treatment plans. Considering these factors, the overall suicide risk at this point appears to be moderate, but not at imminent risk. Patient is appropriate for outpatient follow up. Although he has guns at home, those are locked, and only his wife has access to it.  Collaboration of Care: Collaboration of Care: Other reviewed notes in Epic  Patient/Guardian was advised Release of Information must be obtained prior to any record release in order to collaborate their care with an outside provider. Patient/Guardian was advised if they have not already done so to contact the registration department to sign all necessary forms in order for [place] to release information regarding their care.   Consent: Patient/Guardian gives verbal consent for treatment and assignment of benefits for services provided during this visit. Patient/Guardian expressed understanding and agreed to proceed.    Neysa Hotter, MD 09/22/2023, 2:46 PM

## 2023-09-22 ENCOUNTER — Ambulatory Visit (INDEPENDENT_AMBULATORY_CARE_PROVIDER_SITE_OTHER): Admitting: Psychiatry

## 2023-09-22 ENCOUNTER — Encounter: Payer: Self-pay | Admitting: Psychiatry

## 2023-09-22 VITALS — BP 138/88 | HR 90 | Temp 97.7°F | Ht 73.0 in | Wt 250.8 lb

## 2023-09-22 DIAGNOSIS — F331 Major depressive disorder, recurrent, moderate: Secondary | ICD-10-CM | POA: Diagnosis not present

## 2023-09-22 DIAGNOSIS — F4310 Post-traumatic stress disorder, unspecified: Secondary | ICD-10-CM | POA: Diagnosis not present

## 2023-09-22 DIAGNOSIS — G4700 Insomnia, unspecified: Secondary | ICD-10-CM | POA: Diagnosis not present

## 2023-09-22 MED ORDER — QUETIAPINE FUMARATE 100 MG PO TABS: 150.0000 mg | ORAL_TABLET | Freq: Every day | ORAL | 0 refills | Status: DC

## 2023-09-22 MED ORDER — SERTRALINE HCL 100 MG PO TABS: 100.0000 mg | ORAL_TABLET | Freq: Every day | ORAL | 0 refills | Status: DC

## 2023-09-22 NOTE — Patient Instructions (Signed)
 Restart sertraline 100 mg at night  Restart bupropion 300 mg daily Restart quetiapine 150 mg at night  Obtain lab (iron panels) Therapy referral onsite Next appointment: 4/29 at 3 pm

## 2023-10-08 ENCOUNTER — Other Ambulatory Visit: Payer: Self-pay | Admitting: Internal Medicine

## 2023-10-08 ENCOUNTER — Other Ambulatory Visit: Payer: Self-pay | Admitting: Psychiatry

## 2023-10-08 DIAGNOSIS — F4310 Post-traumatic stress disorder, unspecified: Secondary | ICD-10-CM

## 2023-10-09 ENCOUNTER — Encounter: Payer: Self-pay | Admitting: Cardiovascular Disease

## 2023-10-09 MED ORDER — DIGOXIN 125 MCG PO TABS: 0.1250 mg | ORAL_TABLET | Freq: Every day | ORAL | 3 refills | Status: DC
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
## 2023-11-03 ENCOUNTER — Ambulatory Visit (INDEPENDENT_AMBULATORY_CARE_PROVIDER_SITE_OTHER): Admitting: Psychiatry

## 2023-11-03 ENCOUNTER — Ambulatory Visit (HOSPITAL_BASED_OUTPATIENT_CLINIC_OR_DEPARTMENT_OTHER)

## 2023-11-03 ENCOUNTER — Encounter: Payer: Self-pay | Admitting: Psychiatry

## 2023-11-03 ENCOUNTER — Encounter (HOSPITAL_BASED_OUTPATIENT_CLINIC_OR_DEPARTMENT_OTHER): Payer: Self-pay

## 2023-11-03 VITALS — BP 124/86 | HR 91 | Temp 97.9°F | Ht 73.0 in | Wt 248.0 lb

## 2023-11-03 DIAGNOSIS — F4310 Post-traumatic stress disorder, unspecified: Secondary | ICD-10-CM

## 2023-11-03 DIAGNOSIS — E559 Vitamin D deficiency, unspecified: Secondary | ICD-10-CM

## 2023-11-03 DIAGNOSIS — G4700 Insomnia, unspecified: Secondary | ICD-10-CM

## 2023-11-03 DIAGNOSIS — F331 Major depressive disorder, recurrent, moderate: Secondary | ICD-10-CM

## 2023-11-03 DIAGNOSIS — G2581 Restless legs syndrome: Secondary | ICD-10-CM

## 2023-11-03 MED ORDER — BUPROPION HCL ER (XL) 300 MG PO TB24: 300.0000 mg | ORAL_TABLET | Freq: Every day | ORAL | 1 refills | Status: DC

## 2023-11-03 NOTE — Patient Instructions (Signed)
 Restart sertraline  100 mg at night  Restart bupropion  300 mg daily Hold quetiapine   Obtain lab (check ferritin, vitamin D) Next appointment: 6/12 at 8:30

## 2023-11-09 ENCOUNTER — Encounter: Payer: Self-pay | Admitting: Cardiovascular Disease

## 2023-11-11 ENCOUNTER — Ambulatory Visit: Admitting: Medical

## 2023-11-15 ENCOUNTER — Other Ambulatory Visit: Payer: Self-pay | Admitting: Cardiovascular Disease

## 2023-11-15 ENCOUNTER — Other Ambulatory Visit: Payer: Self-pay | Admitting: Psychiatry

## 2023-11-15 DIAGNOSIS — F4310 Post-traumatic stress disorder, unspecified: Secondary | ICD-10-CM

## 2023-11-18 ENCOUNTER — Other Ambulatory Visit: Payer: Self-pay

## 2023-11-18 MED ORDER — FLUTICASONE PROPIONATE 50 MCG/ACT NA SUSP: NASAL | 1 refills | Status: AC

## 2023-11-25 ENCOUNTER — Ambulatory Visit: Admitting: Licensed Clinical Social Worker

## 2023-11-26 ENCOUNTER — Other Ambulatory Visit: Payer: Self-pay | Admitting: Psychiatry

## 2023-11-26 DIAGNOSIS — F4310 Post-traumatic stress disorder, unspecified: Secondary | ICD-10-CM

## 2023-12-08 ENCOUNTER — Encounter: Payer: Self-pay | Admitting: Internal Medicine

## 2023-12-08 ENCOUNTER — Ambulatory Visit: Payer: Self-pay | Admitting: Internal Medicine

## 2023-12-08 ENCOUNTER — Ambulatory Visit: Payer: 59 | Admitting: Internal Medicine

## 2023-12-08 VITALS — BP 110/72 | HR 80 | Temp 98.2°F | Resp 16 | Ht 73.0 in | Wt 252.0 lb

## 2023-12-08 DIAGNOSIS — F4310 Post-traumatic stress disorder, unspecified: Secondary | ICD-10-CM

## 2023-12-08 DIAGNOSIS — I5042 Chronic combined systolic (congestive) and diastolic (congestive) heart failure: Secondary | ICD-10-CM | POA: Diagnosis not present

## 2023-12-08 DIAGNOSIS — G4733 Obstructive sleep apnea (adult) (pediatric): Secondary | ICD-10-CM

## 2023-12-08 DIAGNOSIS — E785 Hyperlipidemia, unspecified: Secondary | ICD-10-CM

## 2023-12-08 DIAGNOSIS — Z7984 Long term (current) use of oral hypoglycemic drugs: Secondary | ICD-10-CM

## 2023-12-08 DIAGNOSIS — E1165 Type 2 diabetes mellitus with hyperglycemia: Secondary | ICD-10-CM

## 2023-12-08 DIAGNOSIS — I10 Essential (primary) hypertension: Secondary | ICD-10-CM | POA: Diagnosis not present

## 2023-12-08 DIAGNOSIS — I4891 Unspecified atrial fibrillation: Secondary | ICD-10-CM

## 2023-12-08 LAB — HEPATIC FUNCTION PANEL
ALT: 26 U/L (ref 0–53)
AST: 17 U/L (ref 0–37)
Albumin: 4.7 g/dL (ref 3.5–5.2)
Alkaline Phosphatase: 69 U/L (ref 39–117)
Bilirubin, Direct: 0.2 mg/dL (ref 0.0–0.3)
Total Bilirubin: 0.9 mg/dL (ref 0.2–1.2)
Total Protein: 6.9 g/dL (ref 6.0–8.3)

## 2023-12-08 LAB — CBC WITH DIFFERENTIAL/PLATELET
Basophils Absolute: 0 10*3/uL (ref 0.0–0.1)
Basophils Relative: 0.8 % (ref 0.0–3.0)
Eosinophils Absolute: 0.1 10*3/uL (ref 0.0–0.7)
Eosinophils Relative: 1.1 % (ref 0.0–5.0)
HCT: 46 % (ref 39.0–52.0)
Hemoglobin: 15.5 g/dL (ref 13.0–17.0)
Lymphocytes Relative: 24.9 % (ref 12.0–46.0)
Lymphs Abs: 1.5 10*3/uL (ref 0.7–4.0)
MCHC: 33.8 g/dL (ref 30.0–36.0)
MCV: 91 fl (ref 78.0–100.0)
Monocytes Absolute: 0.5 10*3/uL (ref 0.1–1.0)
Monocytes Relative: 9.1 % (ref 3.0–12.0)
Neutro Abs: 3.8 10*3/uL (ref 1.4–7.7)
Neutrophils Relative %: 64.1 % (ref 43.0–77.0)
Platelets: 147 10*3/uL — ABNORMAL LOW (ref 150.0–400.0)
RBC: 5.06 Mil/uL (ref 4.22–5.81)
RDW: 14.2 % (ref 11.5–15.5)
WBC: 5.9 10*3/uL (ref 4.0–10.5)

## 2023-12-08 LAB — LIPID PANEL
Cholesterol: 138 mg/dL (ref 0–200)
HDL: 35.7 mg/dL — ABNORMAL LOW (ref >39.00)
LDL Cholesterol: 66 mg/dL (ref 0–99)
NonHDL: 102.26
Total CHOL/HDL Ratio: 4
Triglycerides: 179 mg/dL — ABNORMAL HIGH (ref 0.0–149.0)
VLDL: 35.8 mg/dL (ref 0.0–40.0)

## 2023-12-08 LAB — BASIC METABOLIC PANEL WITH GFR
BUN: 11 mg/dL (ref 6–23)
CO2: 28 meq/L (ref 19–32)
Calcium: 9.3 mg/dL (ref 8.4–10.5)
Chloride: 103 meq/L (ref 96–112)
Creatinine, Ser: 1.23 mg/dL (ref 0.40–1.50)
GFR: 64.85 mL/min (ref >60.00)
Glucose, Bld: 154 mg/dL — ABNORMAL HIGH (ref 70–99)
Potassium: 4.2 meq/L (ref 3.5–5.1)
Sodium: 139 meq/L (ref 135–145)

## 2023-12-08 LAB — HEMOGLOBIN A1C: Hgb A1c MFr Bld: 7.9 % — ABNORMAL HIGH (ref 4.6–6.5)

## 2023-12-08 NOTE — Assessment & Plan Note (Signed)
 Continues Entresto , Toprol  and Lanoxin .  Breathing stable.  Continues follow-up with cardiology.  Echocardiogram 02/2023 revealed ejection fraction 40 to 45% with normal RV size and function.  No significant valvular disease.

## 2023-12-08 NOTE — Assessment & Plan Note (Signed)
 Low-cholesterol diet and exercise.  Lipitor.  Follow lipid panel.  Check with fasting labs today.

## 2023-12-08 NOTE — Assessment & Plan Note (Signed)
 Blood pressures doing well on Entresto  and metoprolol .  Continue to follow pressures.  Check metabolic panel today.

## 2023-12-08 NOTE — Progress Notes (Signed)
 Subjective:    Patient ID: Dalton [name], male    DOB: 1965-10-06, 58 y.o.   MRN: [id number]  Patient here for  Chief Complaint  Patient presents with   Medical Management of Chronic Issues    HPI Here for a scheduled follow up - follow up regarding diabetes, hypercholesterolemia, CAD and hypertension. Saw ortho 11/06/23 - f/u neck pain and LUE cervical radiculitis. Was given prednisone taper, robaxin and gabapentin. Seeing Dr Edda Goo for f/u depression, PTSD. On sertraline  and wellbutrin . 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 Continues on metoprolol , entrestro, farxiga , eliquis  and digoxin . Had f/u echo 02/18/23 - EF 40-45% with normal RV size and function  no significant valvular heart disease. Feels from a cardiac standpoint - stable. Using cpap. Continues on farxiga , metformin .  Reviewed blood sugars and most are averaging 1 20-1 70s.  His 90-day average is 157.  He has adjusted his diet.  Has lost weight.  Watching carbs.  Stays active.  No chest pain reported.  Breathing stable.  No abdominal pain.  Bowels stable.  Increased stress.  Discussed.  Seen psychiatry.  Overall he feels he is handling things relatively well.  Mother is with hospice.   Past Medical History:  Diagnosis Date   Aortic valve stenosis    a. Bicuspid AV - 2D Echo 06/2014 - mod AS, mild AI, mildly dilated aortic root.   Arthritis    Neck   CAD (coronary artery disease)    Carotid artery disease (HCC)    a. Mild by duplex 05/2015 - 1-39% BICA. Repeat due 05/2017.   Depression    Diabetes mellitus without complication (HCC)    Dilated aortic root (HCC)    a. By echo 06/2014.   Dyspnea    with exercion   Dysrhythmia    Atrial Fibrilation   Environmental allergies    Headache    MIgraines- rare   Heart murmur    Hypercholesterolemia    Hypertension    Obesity (BMI 30-39.9) 04/26/2018   PTSD (post-traumatic stress disorder)    Sleep apnea     CPAP   SVT (supraventricular tachycardia) (HCC)    a. h/o SVT - Ablation done 2013.   Past Surgical History:  Procedure Laterality Date   AORTIC VALVE REPLACEMENT N/A 10/04/2020   Procedure: AORTIC VALVE REPLACEMENT (AVR);  Surgeon: Bartley Lightning, MD;  Location: [facility] OR;  Service: Open Heart Surgery;  Laterality: N/A;   CARDIAC ELECTROPHYSIOLOGY STUDY AND ABLATION  08/21/2011   COLONOSCOPY WITH PROPOFOL  N/A 06/26/2017   Procedure: COLONOSCOPY WITH PROPOFOL ;  Surgeon: Cassie Click, MD;  Location: [facility] ENDOSCOPY;  Service: Endoscopy;  Laterality: N/A;   ESOPHAGOGASTRODUODENOSCOPY (EGD) WITH PROPOFOL  N/A 06/26/2017   Procedure: ESOPHAGOGASTRODUODENOSCOPY (EGD) WITH PROPOFOL ;  Surgeon: Cassie Click, MD;  Location: [facility] ENDOSCOPY;  Service: Endoscopy;  Laterality: N/A;   MAZE N/A 10/04/2020   Procedure: MAZE;  Surgeon: Bartley Lightning, MD;  Location: [facility] OR;  Service: Open Heart Surgery;  Laterality: N/A;   RIGHT/LEFT HEART CATH AND CORONARY ANGIOGRAPHY N/A 11/16/2019   Procedure: RIGHT/LEFT HEART CATH AND CORONARY ANGIOGRAPHY;  Surgeon: Arty Binning, MD;  Location: [facility] INVASIVE CV LAB;  Service: Cardiovascular;  Laterality: N/A;   SEPTOPLASTY N/A 03/22/2015   Procedure: SEPTOPLASTY  WITH RIGHT INFERIOR TURBINATE REDUCTION;  Surgeon: Mellody Sprout, MD;  Location: [facility];  Service: ENT;  Laterality: N/A;   SUPRAVENTRICULAR TACHYCARDIA ABLATION N/A 08/21/2011  Procedure: SUPRAVENTRICULAR TACHYCARDIA ABLATION;  Surgeon: Tammie Fall, MD;  Location: [facility] CATH LAB;  Service: Cardiovascular;  Laterality: N/A;   surgery for non descending testicle     TEE WITHOUT CARDIOVERSION N/A 11/16/2019   Procedure: TRANSESOPHAGEAL ECHOCARDIOGRAM (TEE);  Surgeon: Harrold Lincoln, MD;  Location: [facility] ENDOSCOPY;  Service: Cardiovascular;  Laterality: N/A;   TEE WITHOUT CARDIOVERSION N/A 10/04/2020   Procedure: TRANSESOPHAGEAL ECHOCARDIOGRAM (TEE);  Surgeon: Bartley Lightning, MD;  Location: [facility] OR;   Service: Open Heart Surgery;  Laterality: N/A;   TONSILLECTOMY     Family History  Problem Relation Age of Onset   Diabetes Mother    Heart disease Mother    Arthritis Mother    Heart attack Father    Kidney disease Father    Cancer Father    Breast cancer Maternal Grandmother    Cancer Maternal Grandfather    Aortic stenosis Maternal Grandfather    Social History   Socioeconomic History   Marital status: Married    Spouse name: Not on file   Number of children: 2   Years of education: Not on file   Highest education level: 12th grade  Occupational History   Occupation: truck Air traffic controller: Office manager  Tobacco Use   Smoking status: Former    Current packs/day: 0.00    Types: Cigarettes    Quit date: 11/04/1988    Years since quitting: 35.1   Smokeless tobacco: Former  Building services engineer status: Never Used  Substance and Sexual Activity   Alcohol use: Yes    Comment: occasionally   Drug use: Not Currently    Comment: many years ago in highschool   Sexual activity: Not Currently  Other Topics Concern   Not on file  Social History Narrative   Very rare exercise   Social Drivers of Health   Financial Resource Strain: High Risk (08/05/2023)   Overall Financial Resource Strain (CARDIA)    Difficulty of Paying Living Expenses: Hard  Food Insecurity: Patient Declined (08/05/2023)   Hunger Vital Sign    Worried About Running Out of Food in the Last Year: Patient declined    Ran Out of Food in the Last Year: Patient declined  Transportation Needs: No Transportation Needs (08/05/2023)   PRAPARE - Administrator, Civil Service (Medical): No    Lack of Transportation (Non-Medical): No  Physical Activity: Patient Declined (08/05/2023)   Exercise Vital Sign    Days of Exercise per Week: Patient declined    Minutes of Exercise per Session: Patient declined  Stress: No Stress Concern Present (08/05/2023)   [employer] of Occupational Health -  Occupational Stress Questionnaire    Feeling of Stress : Only a little  Social Connections: Unknown (08/05/2023)   Social Connection and Isolation Panel [NHANES]    Frequency of Communication with Friends and Family: More than three times a week    Frequency of Social Gatherings with Friends and Family: Patient declined    Attends Religious Services: Patient declined    Database administrator or Organizations: Yes    Attends Banker Meetings: Patient declined    Marital Status: Married     Review of Systems  Constitutional:  Negative for appetite change and unexpected weight change.  HENT:  Negative for congestion and sinus pressure.   Respiratory:  Negative for cough, chest tightness and shortness of breath.   Cardiovascular:  Negative for chest pain, palpitations and leg swelling.  Gastrointestinal:  Negative for abdominal pain, diarrhea, nausea and vomiting.  Genitourinary:  Negative for difficulty urinating and dysuria.  Musculoskeletal:  Negative for joint swelling and myalgias.  Skin:  Negative for color change and rash.  Neurological:  Negative for dizziness and headaches.  Psychiatric/Behavioral:  Negative for agitation.        Increased stress as outlined.        Objective:     BP 110/72   Pulse 80   Temp 98.2 F (36.8 C)   Resp 16   Ht 6\' 1"  (1.854 m)   Wt 252 lb (114.3 kg)   SpO2 98%   BMI 33.25 kg/m  Wt Readings from Last 3 Encounters:  12/08/23 252 lb (114.3 kg)  11/03/23 248 lb (112.5 kg)  09/22/23 250 lb 12.8 oz (113.8 kg)    Physical Exam Vitals reviewed.  Constitutional:      General: He is not in acute distress.    Appearance: Normal appearance. He is well-developed.  HENT:     Head: Normocephalic and atraumatic.     Right Ear: External ear normal.     Left Ear: External ear normal.     Mouth/Throat:     Pharynx: No oropharyngeal exudate or posterior oropharyngeal erythema.  Eyes:     General: No scleral icterus.       Right  eye: No discharge.        Left eye: No discharge.     Conjunctiva/sclera: Conjunctivae normal.  Cardiovascular:     Rate and Rhythm: Normal rate and regular rhythm.  Pulmonary:     Effort: Pulmonary effort is normal. No respiratory distress.     Breath sounds: Normal breath sounds.  Abdominal:     General: Bowel sounds are normal.     Palpations: Abdomen is soft.     Tenderness: There is no abdominal tenderness.  Musculoskeletal:        General: No swelling or tenderness.     Cervical back: Neck supple. No tenderness.  Lymphadenopathy:     Cervical: No cervical adenopathy.  Skin:    Findings: No erythema or rash.  Neurological:     Mental Status: He is alert.  Psychiatric:        Mood and Affect: Mood normal.        Behavior: Behavior normal.      Diabetic foot exam was performed with the following findings:   Normal sensation of 10g monofilament PT pulses palpable and equal bilaterally.       Outpatient Encounter Medications as of 12/08/2023  Medication Sig   acetaminophen  (TYLENOL ) 500 MG tablet Take 1,000 mg by mouth every 6 (six) hours as needed for moderate pain or headache.   apixaban  (ELIQUIS ) 5 MG TABS tablet Take 1 tablet (5 mg total) by mouth 2 (two) times daily.   atorvastatin  (LIPITOR) 40 MG tablet TAKE 1 TABLET(40 MG) BY MOUTH DAILY   buPROPion  (WELLBUTRIN  XL) 300 MG 24 hr tablet Take 1 tablet (300 mg total) by mouth daily.   cetirizine (ZYRTEC) 10 MG tablet Take 10 mg by mouth 2 (two) times daily.    CONTOUR NEXT TEST test strip USE AS DIRECTED TO CHECK BLOOD SUGARS TWICE DAILY   digoxin  (LANOXIN ) 0.125 MG tablet Take 1 tablet (0.125 mg total) by mouth daily.   FARXIGA  10 MG TABS tablet TAKE 1 TABLET BY MOUTH DAILY   fluticasone  (FLONASE ) 50 MCG/ACT nasal spray SHAKE LIQUID AND USE 2 SPRAYS IN EACH NOSTRIL DAILY AS NEEDED  FOR ALLERGIES OR RHINITIS   Lancets (ONETOUCH DELICA PLUS LANCET33G) MISC USE TWICE DAILY AS DIRECTED   metFORMIN  (GLUCOPHAGE ) 500 MG  tablet TAKE 1 TABLET BY MOUTH TWICE  DAILY   metoprolol  succinate (TOPROL -XL) 25 MG 24 hr tablet TAKE 1 TABLET BY MOUTH DAILY   NON FORMULARY as directed. CPAP   sacubitril -valsartan  (ENTRESTO ) 24-26 MG Take 1 tablet by mouth 2 (two) times daily.   sertraline  (ZOLOFT ) 100 MG tablet Take 1 tablet (100 mg total) by mouth at bedtime.   [DISCONTINUED] sertraline  (ZOLOFT ) 25 MG tablet Take 1 tablet (25 mg total) by mouth at bedtime for 7 days, THEN 2 tablets (50 mg total) at bedtime for 7 days.   No facility-administered encounter medications on file as of 12/08/2023.     Lab Results  Component Value Date   WBC 5.9 02/02/2023   HGB 16.7 02/02/2023   HCT 50.4 02/02/2023   PLT 158.0 02/02/2023   GLUCOSE 190 (H) 08/21/2023   CHOL 132 08/21/2023   TRIG 210.0 (H) 08/21/2023   HDL 33.90 (L) 08/21/2023   LDLDIRECT 75.0 03/12/2022   LDLCALC 56 08/21/2023   ALT 30 08/21/2023   AST 18 08/21/2023   NA 140 08/21/2023   K 5.0 08/21/2023   CL 101 08/21/2023   CREATININE 1.05 08/21/2023   BUN 10 08/21/2023   CO2 30 08/21/2023   TSH 1.63 08/21/2023   PSA 0.81 02/02/2023   INR 1.4 (H) 10/04/2020   HGBA1C 8.1 (H) 08/21/2023   MICROALBUR 6.5 (H) 02/02/2023    EXERCISE TOLERANCE TEST (ETT) Result Date: 02/13/2023   Baseline ECG demonstrates atrial fibrillation without significant ST/T abnormalities.   The patient exhibits good functional capacity with normal heart rate and blood pressure responses.  No angina was reported.   1.5-2 mm downsloping ST depressions and T wave inversions are seen in the inferolateral leads during peak stress.   Rare PVC's occurred during stress.  Atrial fibrillation persisted throughout stress and recovery.   Intermediate risk stress test (D[facility] Treadmill Score -1 to +1.5). Intermediate risk exercise tolerance test with 1.5-2 mm downsloping ST depressions and T wave inversions in the inferolateral leads at peak stress.  Persistent atrial fibrillation noted.       Assessment &  Plan:  Chronic combined systolic and diastolic heart failure (HCC) Assessment & Plan: Continues Entresto , Toprol  and Lanoxin .  Breathing stable.  Continues follow-up with cardiology.  Echocardiogram 02/2023 revealed ejection fraction 40 to 45% with normal RV size and function.  No significant valvular disease.   Hyperlipidemia, unspecified hyperlipidemia type Assessment & Plan: Low-cholesterol diet and exercise.  Lipitor.  Follow lipid panel.  Check with fasting labs today.  Orders: -     Lipid panel -     Hepatic function panel  Type 2 diabetes mellitus with hyperglycemia, without long-term current use of insulin  (HCC) Assessment & Plan: Reviewed outside sugars.  90-day average is 157.  Continues on metformin  and Farxiga .  Has adjusted his diet.  Has lost weight.  Check Metheney and A1c today.  Off Ozempic .  Orders: -     Basic metabolic panel with GFR -     Hemoglobin A1c  Atrial fibrillation, unspecified type (HCC) Assessment & Plan: On eliquis , digoxin  and metoprolol .  Followed by cardiology.  Stable. Heart rate controlled.   Orders: -     CBC with Differential/Platelet  Essential hypertension Assessment & Plan: Blood pressures doing well on Entresto  and metoprolol .  Continue to follow pressures.  Check metabolic panel  today.   PTSD (post-traumatic stress disorder) Assessment & Plan: Seen Dr. Edda Goo.  Continues on Zoloft  and Wellbutrin .  Increase stress as outlined.  Overall he feels he is doing relatively well.  Continue follow-up with psychiatry.   Obstructive sleep apnea syndrome Assessment & Plan: Continue CPAP.      Dellar Fenton, MD

## 2023-12-08 NOTE — Assessment & Plan Note (Signed)
 On eliquis , digoxin  and metoprolol .  Followed by cardiology.  Stable. Heart rate controlled.

## 2023-12-08 NOTE — Assessment & Plan Note (Signed)
 Reviewed outside sugars.  90-day average is 157.  Continues on metformin  and Farxiga .  Has adjusted his diet.  Has lost weight.  Check Metheney and A1c today.  Off Ozempic .

## 2023-12-08 NOTE — Assessment & Plan Note (Signed)
 Seen Dr. Edda Goo.  Continues on Zoloft  and Wellbutrin .  Increase stress as outlined.  Overall he feels he is doing relatively well.  Continue follow-up with psychiatry.

## 2023-12-08 NOTE — Assessment & Plan Note (Signed)
 Continue CPAP.

## 2023-12-09 ENCOUNTER — Other Ambulatory Visit: Payer: Self-pay | Admitting: Psychiatry

## 2023-12-09 DIAGNOSIS — F4310 Post-traumatic stress disorder, unspecified: Secondary | ICD-10-CM

## 2023-12-18 ENCOUNTER — Other Ambulatory Visit: Payer: Self-pay | Admitting: Psychiatry

## 2023-12-18 ENCOUNTER — Other Ambulatory Visit: Payer: Self-pay | Admitting: Cardiovascular Disease

## 2023-12-18 DIAGNOSIS — I4891 Unspecified atrial fibrillation: Secondary | ICD-10-CM

## 2023-12-18 DIAGNOSIS — F4310 Post-traumatic stress disorder, unspecified: Secondary | ICD-10-CM
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
## 2023-12-22 ENCOUNTER — Ambulatory Visit: Admitting: Psychiatry

## 2023-12-27 ENCOUNTER — Other Ambulatory Visit: Payer: Self-pay | Admitting: Cardiovascular Disease

## 2023-12-27 DIAGNOSIS — E785 Hyperlipidemia, unspecified: Secondary | ICD-10-CM

## 2024-01-01 ENCOUNTER — Other Ambulatory Visit: Payer: Self-pay | Admitting: Psychiatry

## 2024-01-01 DIAGNOSIS — F4310 Post-traumatic stress disorder, unspecified: Secondary | ICD-10-CM

## 2024-01-22 ENCOUNTER — Encounter: Payer: Self-pay | Admitting: Medical

## 2024-01-22 ENCOUNTER — Ambulatory Visit: Attending: Medical | Admitting: Medical

## 2024-01-22 VITALS — BP 120/80 | HR 84 | Ht 73.0 in | Wt 254.0 lb

## 2024-01-22 DIAGNOSIS — I4821 Permanent atrial fibrillation: Secondary | ICD-10-CM | POA: Diagnosis not present

## 2024-01-22 DIAGNOSIS — I5042 Chronic combined systolic (congestive) and diastolic (congestive) heart failure: Secondary | ICD-10-CM | POA: Diagnosis not present

## 2024-01-22 DIAGNOSIS — E782 Mixed hyperlipidemia: Secondary | ICD-10-CM

## 2024-01-22 DIAGNOSIS — I2510 Atherosclerotic heart disease of native coronary artery without angina pectoris: Secondary | ICD-10-CM | POA: Diagnosis not present

## 2024-01-22 DIAGNOSIS — Z952 Presence of prosthetic heart valve: Secondary | ICD-10-CM | POA: Diagnosis not present

## 2024-01-22 NOTE — Progress Notes (Signed)
 Cardiology Office Note   Date:  01/22/2024  ID:  Dalton [name], DOB Nov 30, 1965, MRN [id number] PCP: Glendia Shad, MD  [facility] Providers Cardiologist:  Evalene Lunger, MD Sleep Medicine:  Wilbert Bihari, MD   History of Present Illness Dalton [name] is a 58 y.o. male with a h/o bicuspid aortic valve with AS/AR 09/2020, prior SVT s/p RF ablation, HTN, HLD, AVR 09/2020 mmEdwards valve, DM2, OSA on CPAP, chronic combined systolic and diastolic HF, persistent Afib s/p MAZE procedure (now permanent Afib), CAD who presents for 1 year follow-up.   Echo in 2020 showed LVEF 45-50%, mod AS. Echo in 09/2019 showed LVEF 40-45%. TEE showed mod AS, mod AI, LVEF 45-50%. R/L heart cath in 11/2019 showed mild AS with mean gradient , mod AI, mild pulmonary HTN, luminal irregularities in the LAD, dominant RCA, widely patent. He was referred to CTS and patient underwent AV replacement and MACE procedure 09/2020.  Echo 09/2020 showed LVEF 60-65%, no WMA. The patient initially was in NSR, but later converted back to Afib. Repeat echo 10/2020 showed decreased LVEF at 35% and patient was started on GDMT. Since that time LVEF has remained around 40-45%.   The patient was last seen 01/2023 and was stable from a cardiac perspective.   Today, the patient is overall doing well. He denies chest pain or SOB. He reports weight generally goes up in the summer. He walks and does heavy moving at work. Diet is fairly healthy. He denies bleeding issues with Eliquis . No lower leg edema, lightheadedness, dizziness, orthopnea, fever, chills, or other illness.   Studies Reviewed EKG Interpretation Date/Time:  Friday January 22 2024 13:30:42 EDT Ventricular Rate:  84 PR Interval:    QRS Duration:  98 QT Interval:  348 QTC Calculation: 411 R Axis:   46  Text Interpretation: Atrial fibrillation Nonspecific T wave abnormality When compared with ECG of 19-Jan-2023 08:22, Nonspecific T wave abnormality now evident in  Anterior leads Confirmed by Franchester, Ebony Rickel ([id number]) on 01/22/2024 1:33:53 PM    Echo 02/2023 1. Left ventricular ejection fraction, by estimation, is 40 to 45%. The  left ventricle has mildly decreased function. The left ventricle  demonstrates global hypokinesis. The left ventricular internal cavity size  was mildly dilated. Left ventricular  diastolic parameters are indeterminate.   2. Right ventricular systolic function is normal. The right ventricular  size is normal. Tricuspid regurgitation signal is inadequate for assessing  PA pressure.   3. Left atrial size was moderately dilated.   4. The mitral valve is normal in structure. No evidence of mitral valve  regurgitation. No evidence of mitral stenosis.   5. The aortic valve has been repaired/replaced. Aortic valve  regurgitation is not visualized. No aortic stenosis is present. Aortic  valve mean gradient measures 10.0 mmHg.   6. The inferior vena cava is normal in size with greater than 50%  respiratory variability, suggesting right atrial pressure of 3 mmHg.   ETT 02/2023   Baseline ECG demonstrates atrial fibrillation without significant ST/T abnormalities.   The patient exhibits good functional capacity with normal heart rate and blood pressure responses.  No angina was reported.   1.5-2 mm downsloping ST depressions and T wave inversions are seen in the inferolateral leads during peak stress.   Rare PVC's occurred during stress.  Atrial fibrillation persisted throughout stress and recovery.   Intermediate risk stress test (Duke Treadmill Score -1 to +1.5).   Intermediate risk exercise tolerance test with 1.5-2 mm downsloping ST  depressions and T wave inversions in the inferolateral leads at peak stress.  Persistent atrial fibrillation noted.  Echo 03/2022 1. Left ventricular ejection fraction, by estimation, is 40 to 45%. The  left ventricle has mildly decreased function. The left ventricle  demonstrates global hypokinesis.  There is mild concentric left ventricular  hypertrophy. Left ventricular diastolic  parameters are indeterminate.   2. Right ventricular systolic function is mildly reduced. The right  ventricular size is normal. There is normal pulmonary artery systolic  pressure. The estimated right ventricular systolic pressure is 20.0 mmHg.   3. Left atrial size was mildly dilated.   4. Right atrial size was mildly dilated.   5. The mitral valve is normal in structure. Trivial mitral valve  regurgitation. No evidence of mitral stenosis.   6. There is a bioprosthetic aortic valve. Mean gradient 13 mmHg, no  significant bioprosthetic valve stenosis. No perivalvular leakage.   7. Aortic dilatation noted. There is mild dilatation of the ascending  aorta, measuring 37 mm.   8. The inferior vena cava is normal in size with greater than 50%  respiratory variability, suggesting right atrial pressure of 3 mmHg.   9. The patient was in atrial fibrillation.   Echo 11/2020  1. Left ventricular ejection fraction, by estimation, is 40 to 45%. The  left ventricle has mildly decreased function. The left ventricle  demonstrates global hypokinesis. There is mild left ventricular  hypertrophy. Left ventricular diastolic parameters  are consistent with Grade I diastolic dysfunction (impaired relaxation).   2. Right ventricular systolic function is normal. The right ventricular  size is normal.   3. The mitral valve is normal in structure. No evidence of mitral valve  regurgitation. No evidence of mitral stenosis.   4. The aortic valve has been repaired/replaced. Aortic valve  regurgitation is not visualized. No aortic stenosis is present. There is a  25 mm Edwards valve present in the aortic position. Procedure Date:  10/04/20. Echo findings are consistent with normal   structure and function of the aortic valve prosthesis. Aortic valve area,  by VTI measures 1.76 cm. Aortic valve mean gradient measures 11.8 mmHg.   Aortic valve Vmax measures 2.43 m/s.   5. The inferior vena cava is normal in size with greater than 50%  respiratory variability, suggesting right atrial pressure of 3 mmHg.   Echo 10/2020  1. A small pericardial effusion is present. The pericardial effusion is  circumferential. There is no evidence of cardiac tamponade.   2. Left ventricular ejection fraction, by estimation, is 35%. The left  ventricle has moderately decreased function. The left ventricle  demonstrates global hypokinesis. Left ventricular diastolic parameters are  indeterminate.   3. Right ventricular systolic function is moderately reduced. The right  ventricular size is mildly enlarged. There is normal pulmonary artery  systolic pressure. The estimated right ventricular systolic pressure is  24.2 mmHg.   4. Left atrial size was mild to moderately dilated.   5. Right atrial size was mildly dilated.   6. The mitral valve is grossly normal. Trivial mitral valve  regurgitation. No evidence of mitral stenosis.   7. The aortic valve has been repaired/replaced. Aortic valve  regurgitation is not visualized. There is a 25 mm Edwards pericardial  valve present in the aortic position. Procedure Date: 10/04/2020. Echo  findings are consistent with normal structure and  function of the aortic valve prosthesis. Aortic valve mean gradient  measures 9.0 mmHg.   8. The inferior vena cava is normal  in size with <50% respiratory  variability, suggesting right atrial pressure of 8 mmHg.    Echo 09/2020 1. Left ventricular ejection fraction, by estimation, is 60 to 65%. The  left ventricle has normal function. The left ventricle has no regional  wall motion abnormalities. Left ventricular diastolic function could not  be evaluated.   2. Right ventricular systolic function is normal. The right ventricular  size is mildly enlarged.   3. Right atrial size was mild to moderately dilated.   4. The mitral valve is normal in structure.  Trivial mitral valve  regurgitation. No evidence of mitral stenosis.   5. The AV is poorly visualized. The aortic valve is abnormal.It is  reportedly bicuspid on prior echo but cannot discern on this study the  morphology of th e valve. The leaflets are heavily calcified. Aortic valve  regurgitation is mild. Mild to moderate  aortic valve stenosis. Aortic regurgitation PHT measures 450 msec. Aortic  valve area, by VTI measures 1.47 cm. Aortic valve mean gradient measures  18.2 mmHg. Aortic valve Vmax measures 2.60 m/s.   6. Aortic dilatation noted. There is mild dilatation of the aortic root,  measuring 38 mm. There is mild dilatation of the ascending aorta,  measuring 40 mm.   7. The inferior vena cava is normal in size with greater than 50%  respiratory variability, suggesting right atrial pressure of 3 mmHg.     R/L heart cath 11/2019 Mild aortic stenosis with mean gradient 14 mmHg and calculated aortic valve area of 1.69 cm Moderate aortic regurgitation documented on both transthoracic and transesophageal echocardiography. Mild pulmonary hypertension Elevated left ventricular end-diastolic pressure 20 mmHg consistent with chronic combined systolic and diastolic heart failure.  Echo documentation of ejection fraction in the 45% range which has gradually developed over the past 3 years. Normal left main Luminal irregularities in LAD Dominant right coronary, widely patent Nondominant right coronary.   RECOMMENDATIONS: Does not appear that the aortic valve is severe enough to cause decreased LV function. Suspect primary myocardial process plus/minus impact of atrial fibrillation rate control on LV function.  We will uptitrate Toprol -XL to 50 mg/day; increase lisinopril  to 20 mg/day; remove HCTZ to allow blood pressure that will sustain up titration of guideline directed therapy.  Consider adding MRA if diuretic needed Other considerations for decreased LV function could be related to  sleep apnea (discussed with Dr. Shlomo and patient's most recent download indicates excellent therapy); infiltrative process such as amyloid; other.  May ultimately need heart failure clinic consultation. Will not need referral to the valve clinic at this time. Recommendations  Antiplatelet/Anticoag Recommend to resume Apixaban , at currently prescribed dose and frequency. Concurrent antiplatelet therapy not recommended.       Physical Exam VS:  BP 120/80 (BP Location: Left Arm, Patient Position: Sitting, Cuff Size: Normal)   Pulse 84   Ht 6' 1 (1.854 m)   Wt 254 lb (115.2 kg)   SpO2 98%   BMI 33.51 kg/m        Wt Readings from Last 3 Encounters:  01/22/24 254 lb (115.2 kg)  12/08/23 252 lb (114.3 kg)  08/07/23 244 lb (110.7 kg)    GEN: Well nourished, well developed in no acute distress NECK: No JVD; No carotid bruits CARDIAC: Irreg Irreg, no murmurs, rubs, gallops RESPIRATORY:  Clear to auscultation without rales, wheezing or rhonchi  ABDOMEN: Soft, non-tender, non-distended EXTREMITIES:  No edema; No deformity   ASSESSMENT AND PLAN  Chronic systolic and diastolic heart failure  Echo in 2021, prior surgery, showed LVEF 40-45%. Most recent echo 02/2023 showed LVEF 40-45%. He is euvolemic on exam. Continue Toprol , Entresto , Farxiga  and digoxin .   S/p AVR 09/2020 Echo 02/2023 showed normally functioning valve with mean gradient . Plan to repeat next year.  DOT clearance He needs echo/ETT every 2 years, he will need this next year. Letter was provided.   Permanent Afib EKG shows rate controlled Afib. Continue Eliquis  5mg  BID. He denies bleeding issues.   CAD with stable angina He denies anginal symptoms. He is active at his job. Continue Lipitor and BB. No ASA given Eliquis .   HLD LDL 66. Continue Lipitor 40mg  daily.        Dispo: Follow-up in 1 year  Signed, Khyson Sebesta VEAR Fishman, PA-C

## 2024-01-22 NOTE — Patient Instructions (Signed)
 Medication Instructions:  Your physician recommends that you continue on your current medications as directed. Please refer to the Current Medication list given to you today.   *If you need a refill on your cardiac medications before your next appointment, please call your pharmacy*  Lab Work: No labs ordered today   Testing/Procedures: No test ordered today   Follow-Up: At [facility], you and your health needs are our priority.  As part of our continuing mission to provide you with exceptional heart care, our providers are all part of one team.  This team includes your primary Cardiologist (physician) and Advanced Practice Providers or APPs (Physician Assistants and Nurse Practitioners) who all work together to provide you with the care you need, when you need it.  Your next appointment:   1 year(s)  Provider:   Toribio Frees, PA-C
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
## 2024-02-01 ENCOUNTER — Encounter: Payer: Self-pay | Admitting: Psychiatry

## 2024-02-01 ENCOUNTER — Ambulatory Visit (INDEPENDENT_AMBULATORY_CARE_PROVIDER_SITE_OTHER): Admitting: Psychiatry

## 2024-02-01 VITALS — BP 124/88 | HR 94 | Temp 98.1°F | Ht 73.0 in | Wt 251.2 lb

## 2024-02-01 DIAGNOSIS — G2581 Restless legs syndrome: Secondary | ICD-10-CM

## 2024-02-01 DIAGNOSIS — R5383 Other fatigue: Secondary | ICD-10-CM

## 2024-02-01 DIAGNOSIS — F331 Major depressive disorder, recurrent, moderate: Secondary | ICD-10-CM | POA: Diagnosis not present

## 2024-02-01 DIAGNOSIS — E559 Vitamin D deficiency, unspecified: Secondary | ICD-10-CM

## 2024-02-01 DIAGNOSIS — F4310 Post-traumatic stress disorder, unspecified: Secondary | ICD-10-CM | POA: Diagnosis not present

## 2024-02-01 DIAGNOSIS — G4700 Insomnia, unspecified: Secondary | ICD-10-CM | POA: Diagnosis not present

## 2024-02-01 MED ORDER — SERTRALINE HCL 100 MG PO TABS: 150.0000 mg | ORAL_TABLET | Freq: Every day | ORAL | 0 refills | Status: DC

## 2024-02-01 NOTE — Patient Instructions (Signed)
 Increase sertraline  150 mg at night  Continue bupropion  300 mg daily Obtain lab (check ferritin, vitamin D) Next appointment: 9/9 at 8:30

## 2024-02-03 ENCOUNTER — Other Ambulatory Visit: Payer: Self-pay

## 2024-02-03 MED ORDER — SACUBITRIL-VALSARTAN 24-26 MG PO TABS
1.0000 | ORAL_TABLET | Freq: Two times a day (BID) | ORAL | 3 refills | Status: AC
Start: 2024-02-03 — End: ?

## 2024-02-05 ENCOUNTER — Other Ambulatory Visit: Payer: Self-pay | Admitting: Cardiovascular Disease

## 2024-02-05 ENCOUNTER — Other Ambulatory Visit: Payer: Self-pay | Admitting: Psychiatry

## 2024-02-10 ENCOUNTER — Other Ambulatory Visit: Payer: Self-pay | Admitting: Internal Medicine

## 2024-03-09 NOTE — Progress Notes (Signed)
 [facility] MD/PA/NP OP Progress Note  03/15/2024 9:08 AM Dalton [name]  MRN:  [id number]  Chief Complaint:  Chief Complaint  Patient presents with   Follow-up   HPI:  This is a follow-up appointment for depression, PTSD and insomnia.  He states that he has been stressed at work as usual.  He is also concerned about his mother with dementia.  She wanders out of the house at night.  Although her sister lives next-door, she is a Financial controller and his wife takes care of her most of the time.  She has been just back from a cruise, and will be going for another cruise for 18 days.  Although he did talk about assisted-living facility, she is not interested, stating that they want to spend time with family.  He agrees to invite his sister to primary care's visit together to hear professional opinion about this.  He sleeps only for a few hours.  He partly attributes this to shoulder pain.  Although he denies any nightmares, he has some vivid dreams.  He has hypervigilance.  He denies dizziness.  Although he reports HI at work, this is a form of frustration and he denies any plan or intent, stating that he cannot afford losing the job.  He denies hallucinations.  He denies any SI.  He has not noticed any difference since uptitration of sertraline .  He agrees with the plans as outlined below.   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 He was in [place] after 911, no combat experience Support: wife Household: [name]/his wife  Marital status: married with his wife of six years Number of children: 2 daughters, age 36, 63,  3 grandchild (oldest in [place] )  Visit Diagnosis:    ICD-10-CM   1. PTSD (post-traumatic  stress disorder)  F43.10     2. Moderate episode of recurrent major depressive disorder (HCC)  F33.1     3. Insomnia, unspecified type  G47.00       Past Psychiatric History: Please see initial evaluation for full details. I have reviewed the history. No updates at this time.     Past Medical History:  Past Medical History:  Diagnosis Date   Aortic valve stenosis    a. Bicuspid AV - 2D Echo 06/2014 - mod AS, mild AI, mildly dilated aortic root.   Arthritis    Neck   CAD (coronary artery disease)    Carotid artery disease (HCC)    a. Mild by duplex 05/2015 - 1-39% BICA. Repeat due 05/2017.   Depression    Diabetes mellitus without complication (HCC)    Dilated aortic root (HCC)    a. By echo 06/2014.   Dyspnea    with exercion   Dysrhythmia    Atrial Fibrilation   Environmental allergies    Headache    MIgraines- rare   Heart murmur    Hypercholesterolemia    Hypertension    Obesity (BMI 30-39.9) 04/26/2018   PTSD (post-traumatic stress disorder)    Sleep apnea    CPAP   SVT (supraventricular tachycardia) (HCC)    a. h/o SVT -  Ablation done 2013.    Past Surgical History:  Procedure Laterality Date   AORTIC VALVE REPLACEMENT N/A 10/04/2020   Procedure: AORTIC VALVE REPLACEMENT (AVR);  Surgeon: Lucas Dorise POUR, MD;  Location: [facility] OR;  Service: Open Heart Surgery;  Laterality: N/A;   CARDIAC ELECTROPHYSIOLOGY STUDY AND ABLATION  08/21/2011   COLONOSCOPY WITH PROPOFOL  N/A 06/26/2017   Procedure: COLONOSCOPY WITH PROPOFOL ;  Surgeon: Viktoria Lamar DASEN, MD;  Location: [facility] ENDOSCOPY;  Service: Endoscopy;  Laterality: N/A;   ESOPHAGOGASTRODUODENOSCOPY (EGD) WITH PROPOFOL  N/A 06/26/2017   Procedure: ESOPHAGOGASTRODUODENOSCOPY (EGD) WITH PROPOFOL ;  Surgeon: Viktoria Lamar DASEN, MD;  Location: [facility] ENDOSCOPY;  Service: Endoscopy;  Laterality: N/A;   MAZE N/A 10/04/2020   Procedure: MAZE;  Surgeon: Lucas Dorise POUR, MD;  Location: [facility] OR;  Service: Open Heart Surgery;  Laterality:  N/A;   RIGHT/LEFT HEART CATH AND CORONARY ANGIOGRAPHY N/A 11/16/2019   Procedure: RIGHT/LEFT HEART CATH AND CORONARY ANGIOGRAPHY;  Surgeon: Claudene Victory ORN, MD;  Location: [facility] INVASIVE CV LAB;  Service: Cardiovascular;  Laterality: N/A;   SEPTOPLASTY N/A 03/22/2015   Procedure: SEPTOPLASTY  WITH RIGHT INFERIOR TURBINATE REDUCTION;  Surgeon: Deward Argue, MD;  Location: [facility];  Service: ENT;  Laterality: N/A;   SUPRAVENTRICULAR TACHYCARDIA ABLATION N/A 08/21/2011   Procedure: SUPRAVENTRICULAR TACHYCARDIA ABLATION;  Surgeon: Danelle ORN Birmingham, MD;  Location: [facility] CATH LAB;  Service: Cardiovascular;  Laterality: N/A;   surgery for non descending testicle     TEE WITHOUT CARDIOVERSION N/A 11/16/2019   Procedure: TRANSESOPHAGEAL ECHOCARDIOGRAM (TEE);  Surgeon: Barbaraann Darryle Ned, MD;  Location: [facility] ENDOSCOPY;  Service: Cardiovascular;  Laterality: N/A;   TEE WITHOUT CARDIOVERSION N/A 10/04/2020   Procedure: TRANSESOPHAGEAL ECHOCARDIOGRAM (TEE);  Surgeon: Lucas Dorise POUR, MD;  Location: [facility] OR;  Service: Open Heart Surgery;  Laterality: N/A;   TONSILLECTOMY      Family Psychiatric History: Please see initial evaluation for full details. I have reviewed the history. No updates at this time.     Family History:  Family History  Problem Relation Age of Onset   Diabetes Mother    Heart disease Mother    Arthritis Mother    Heart attack Father    Kidney disease Father    Cancer Father    Breast cancer Maternal Grandmother    Cancer Maternal Grandfather    Aortic stenosis Maternal Grandfather     Social History:  Social History   Socioeconomic History   Marital status: Married    Spouse name: Not on file   Number of children: 2   Years of education: Not on file   Highest education level: 12th grade  Occupational History   Occupation: truck Air traffic controller: Office manager  Tobacco Use   Smoking status: Former    Current packs/day: 0.00    Types: Cigarettes    Quit date:  11/04/1988    Years since quitting: 35.3   Smokeless tobacco: Former  Building services engineer status: Never Used  Substance and Sexual Activity   Alcohol use: Yes    Comment: occasionally   Drug use: Not Currently    Comment: many years ago in highschool   Sexual activity: Not Currently  Other Topics Concern   Not on file  Social History Narrative   Very rare exercise   Social Drivers of Health   Financial Resource Strain: High Risk (08/05/2023)   Overall Financial Resource Strain (CARDIA)    Difficulty of Paying Living Expenses: Hard  Food Insecurity: Patient  Declined (08/05/2023)   Hunger Vital Sign    Worried About Running Out of Food in the Last Year: Patient declined    Ran Out of Food in the Last Year: Patient declined  Transportation Needs: No Transportation Needs (08/05/2023)   PRAPARE - Administrator, Civil Service (Medical): No    Lack of Transportation (Non-Medical): No  Physical Activity: Patient Declined (08/05/2023)   Exercise Vital Sign    Days of Exercise per Week: Patient declined    Minutes of Exercise per Session: Patient declined  Stress: No Stress Concern Present (08/05/2023)   [employer] of Occupational Health - Occupational Stress Questionnaire    Feeling of Stress : Only a little  Social Connections: Unknown (08/05/2023)   Social Connection and Isolation Panel    Frequency of Communication with Friends and Family: More than three times a week    Frequency of Social Gatherings with Friends and Family: Patient declined    Attends Religious Services: Patient declined    Database administrator or Organizations: Yes    Attends Banker Meetings: Patient declined    Marital Status: Married    Allergies:  Allergies  Allergen Reactions   Tomato Hives and Itching    Only allergic to raw tomato  Solanum (organism)    Metabolic Disorder Labs: Lab Results  Component Value Date   HGBA1C 7.9 (H) 12/08/2023   MPG 128.37 09/20/2020    No results found for: PROLACTIN Lab Results  Component Value Date   CHOL 138 12/08/2023   TRIG 179.0 (H) 12/08/2023   HDL 35.70 (L) 12/08/2023   CHOLHDL 4 12/08/2023   VLDL 35.8 12/08/2023   LDLCALC 66 12/08/2023   LDLCALC 56 08/21/2023   Lab Results  Component Value Date   TSH 1.63 08/21/2023   TSH 1.49 02/02/2023    Therapeutic Level Labs: No results found for: LITHIUM No results found for: VALPROATE No results found for: CBMZ  Current Medications: Current Outpatient Medications  Medication Sig Dispense Refill   acetaminophen  (TYLENOL ) 500 MG tablet Take 1,000 mg by mouth every 6 (six) hours as needed for moderate pain or headache.     atorvastatin  (LIPITOR) 40 MG tablet TAKE 1 TABLET BY MOUTH DAILY 90 tablet 3   cetirizine (ZYRTEC) 10 MG tablet Take 10 mg by mouth 2 (two) times daily.      CONTOUR NEXT TEST test strip USE AS DIRECTED TO CHECK BLOOD SUGARS TWICE DAILY 100 strip 1   digoxin  (LANOXIN ) 0.125 MG tablet Take 1 tablet (0.125 mg total) by mouth daily. 90 tablet 3   ELIQUIS  5 MG TABS tablet TAKE 1 TABLET BY MOUTH TWICE  DAILY 180 tablet 3   FARXIGA  10 MG TABS tablet TAKE 1 TABLET BY MOUTH DAILY 60 tablet 5   fluticasone  (FLONASE ) 50 MCG/ACT nasal spray SHAKE LIQUID AND USE 2 SPRAYS IN EACH NOSTRIL DAILY AS NEEDED FOR ALLERGIES OR RHINITIS 48 g 1   gabapentin (NEURONTIN) 300 MG capsule Take 300 mg by mouth 3 (three) times daily.     Lancets (ONETOUCH DELICA PLUS LANCET33G) MISC USE TWICE DAILY AS DIRECTED 100 each 7   metFORMIN  (GLUCOPHAGE ) 500 MG tablet TAKE 1 TABLET BY MOUTH TWICE  DAILY 180 tablet 3   metoprolol  succinate (TOPROL -XL) 25 MG 24 hr tablet TAKE 1 TABLET BY MOUTH DAILY 90 tablet 3   NON FORMULARY as directed. CPAP     OZEMPIC , 1 MG/DOSE, 4 MG/3ML SOPN INJECT SUBCUTANEOUSLY 1 MG EVERY WEEK  6 mL 5   sacubitril -valsartan  (ENTRESTO ) 24-26 MG Take 1 tablet by mouth 2 (two) times daily. 180 tablet 3   Vilazodone  HCl (VIIBRYD ) 10 MG TABS 5 mg  daily for one week, then 10 mg daily 90 tablet 0   [START ON 05/01/2024] buPROPion  (WELLBUTRIN  XL) 300 MG 24 hr tablet Take 1 tablet (300 mg total) by mouth daily. 90 tablet 1   sertraline  (ZOLOFT ) 100 MG tablet Take 1.5 tablets (150 mg total) by mouth at bedtime. (Patient not taking: Reported on 03/15/2024) 135 tablet 0   No current facility-administered medications for this visit.     Musculoskeletal: Strength & Muscle Tone: within normal limits Gait & Station: normal Patient leans: N/A  Psychiatric Specialty Exam: Review of Systems  Psychiatric/Behavioral:  Positive for dysphoric mood and sleep disturbance. Negative for agitation, behavioral problems, confusion, decreased concentration, hallucinations, self-injury and suicidal ideas. The patient is nervous/anxious. The patient is not hyperactive.   All other systems reviewed and are negative.   Blood pressure 128/76, pulse 95, temperature 98.1 F (36.7 C), temperature source Temporal, height 6' 1 (1.854 m), weight 252 lb (114.3 kg), SpO2 100%.Body mass index is 33.25 kg/m.  General Appearance: Well Groomed  Eye Contact:  Good  Speech:  Clear and Coherent  Volume:  Normal  Mood:  Anxious and Depressed  Affect:  Appropriate, Congruent, and down  Thought Process:  Coherent  Orientation:  Full (Time, Place, and Person)  Thought Content: Logical   Suicidal Thoughts:  No  Homicidal Thoughts:  Yes.  without intent/plan  Memory:  Immediate;   Good  Judgement:  Good  Insight:  Good  Psychomotor Activity:  Normal  Concentration:  Concentration: Good and Attention Span: Good  Recall:  Good  Fund of Knowledge: Good  Language: Good  Akathisia:  No  Handed:  Right  AIMS (if indicated): not done  Assets:  Communication Skills Desire for Improvement  ADL's:  Intact  Cognition: WNL  Sleep:  poor   Screenings: GAD-7    Flowsheet Row Office Visit from 11/03/2023 in [facility] Office Visit  from 03/19/2023 in [facility] from 02[facility]02/2023 [facility] from 07/21/2022 in [facility] Counselor from 07/27/2020 in [facility]  Total GAD-7 Score 12 10 11 13 13    PHQ2-9    Flowsheet Row Office Visit from 11/03/2023 in [facility] Most recent reading at 11/03/2023  3:56 PM Office Visit from 05/11/2023 in [facility] Most recent reading at 05/11/2023 11:09 AM Office Visit from 03/19/2023 in [facility] Most recent reading at 03/19/2023 10:12 AM Office Visit from 02/02/2023 in [facility] Most recent reading at 02/02/2023 11:28 AM Office Visit from 02/02/2023 in [facility] Most recent reading at 02/02/2023  8:01 AM  PHQ-2 Total Score 4 4 3 3 3   PHQ-9 Total Score 14 16 14 12 13    Flowsheet Row Office Visit from 07/21/2022 in [facility] Counselor from 05/14/2021 in [facility] Counselor from 03/05/2021 in [facility]  C-SSRS RISK CATEGORY Error: Q3, 4, or 5 should not be populated when Q2 is No No Risk Low Risk     Assessment and Plan:  Dalton [name] is  a 58 y.o. 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   1. PTSD (post-traumatic stress disorder) 2. 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  He is now struggling with conflict with his sister and issues related to their mother with dementia.  Will cross-taper from sertraline  to Viibryd  given he has limited effectiveness from sertraline .  Discussed potential risk of nausea.  Will continue bupropion  as an active treatment for depression. He will greatly benefit from CBT/EMDR; referral was made.  3. Insomnia, unspecified type Unstable.  He may benefit from prazosin; he agrees to try this medication after the above cross-taper is completed to reduce any possible medication side effect.     4. Restless leg 6. Vitamin D deficiency - - using a new CPAP machine Unstable.  He was previously advised to obtain labs to rule out medical health issues contributing to fatigue/history of restless leg.       Last checked  EKG HR 80, QTc457msec Afib 01/2023  Lipid panels LDL 56 07/2023  HbA1c 8.1, on metformin  07/2023      Plan Start viibryd  5 mg for one week, then 10 mg daily  Decrease sertraline  100 mg at night for one week, then 50 mg at night for one week, then discontinue Continue bupropion  300 mg daily Obtain lab (check ferritin, vitamin D) Next appointment: 11/4 at 8:30, IP, waitlist for sooner appointment - on ozempic , metformin  (Was on quetiapine  150 mg at night)  Past trials of medication: citalopram , lamotrigine , quetiapine , Trazodone,     I have reviewed suicide assessment in detail. No change in the following assessment.    The patient demonstrates the following risk factors for suicide: Chronic risk factors for suicide include: psychiatric disorder of depression, PTSD. Acute risk factors for suicide include: N/A. Protective factors for this patient include: positive social support,  responsibility to others (children, family), coping skills and hope for the future. He is future oriented, and is amenable to treatment plans. Considering these factors, the overall suicide risk at this point appears to be moderate, but not at imminent risk. Patient is appropriate for outpatient follow up. Although he has guns at home, those are locked, and only his wife has access to it.  Collaboration of Care: Collaboration of Care: Other reviewed notes in Epic  Patient/Guardian was advised Release of Information must be obtained prior to any record release in order to collaborate their care with an outside provider. Patient/Guardian was advised if they have not already done so to contact the registration department to sign all necessary forms in order for us  to release information regarding their care.   Consent: Patient/Guardian gives verbal consent for treatment and assignment of benefits for services provided during this visit. Patient/Guardian expressed understanding and agreed to proceed.    Katheren Sleet, MD 03/15/2024, 9:08 AM

## 2024-03-15 ENCOUNTER — Ambulatory Visit (INDEPENDENT_AMBULATORY_CARE_PROVIDER_SITE_OTHER): Admitting: Psychiatry

## 2024-03-15 ENCOUNTER — Encounter: Payer: Self-pay | Admitting: Psychiatry

## 2024-03-15 VITALS — BP 128/76 | HR 95 | Temp 98.1°F | Ht 73.0 in | Wt 252.0 lb

## 2024-03-15 DIAGNOSIS — F4310 Post-traumatic stress disorder, unspecified: Secondary | ICD-10-CM

## 2024-03-15 DIAGNOSIS — G4700 Insomnia, unspecified: Secondary | ICD-10-CM

## 2024-03-15 DIAGNOSIS — F331 Major depressive disorder, recurrent, moderate: Secondary | ICD-10-CM | POA: Diagnosis not present

## 2024-03-15 MED ORDER — BUPROPION HCL ER (XL) 300 MG PO TB24: 300.0000 mg | ORAL_TABLET | Freq: Every day | ORAL | 1 refills | Status: AC

## 2024-03-15 MED ORDER — VILAZODONE HCL 10 MG PO TABS: ORAL_TABLET | ORAL | 0 refills | Status: DC

## 2024-03-15 NOTE — Patient Instructions (Signed)
 Start viibryd  5 mg for one week, then 10 mg daily  Decrease sertraline  100 mg at night for one week, then 50 mg at night for one week, then discontinue Continue bupropion  300 mg daily Obtain lab (check ferritin, vitamin D) Next appointment: 11/4 at 8:30

## 2024-03-22 ENCOUNTER — Encounter: Payer: Self-pay | Admitting: Internal Medicine

## 2024-03-30 NOTE — Telephone Encounter (Signed)
 LM for patient. If him or his wife call back, please schedule him for a physical with Dr Glendia on May 10, 2024. If unable to schedule, please transfer to office to schedule.

## 2024-04-02 ENCOUNTER — Other Ambulatory Visit: Payer: Self-pay | Admitting: Psychiatry

## 2024-04-08 ENCOUNTER — Encounter: Admitting: Internal Medicine

## 2024-05-05 NOTE — Progress Notes (Signed)
 [facility] MD/PA/NP OP Progress Note  05/10/2024 9:20 AM Dalton [name]  MRN:  [id number]  Chief Complaint:  Chief Complaint  Patient presents with   Follow-up   HPI:  This is a follow-up appointment for depression, PTSD and insomnia.  He states that he continues to struggle with insomnia.  He continues to experience this despite changing his mask.  He feels restless and has restless leg.  He tends to startle easily.  He constantly thinks about something, and has no desire to do anything.  He cannot motivate himself to get up.  He does not recall dreams and is unsure of having any nightmares.  He has been trying to keep his mouth shut, she has been a struggle.  He is very stressed at work and he wants others to leave him alone.  He is in a survival mode, so that he does not have conflict.  Although he has passive SI, he would not do it for his granddaughter.  He does not think he can allow himself if her were to do it either.  Although he has guns, those are locked and he does not have access to them.  Although he reports HI if he were to be pushed to the limit at work, he denies any intent or plan.  He agrees to contact emergency resources if any worsening.  He also reports having emergency numbers on the refrigerator.  He reports worsening in appetite.  Although he denies hallucinations, he feels somebody is there.  He agrees with the plans as outlined .  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 He was in [place] after 911, no combat experience Support:  wife Household: [name]/his wife  Marital status: married with his wife of six years Number of children: 2 daughters, age 48, 49,  3 grandchild (oldest in [place] )   Visit Diagnosis:    ICD-10-CM   1. PTSD (post-traumatic stress disorder)  F43.10     2. Moderate episode of recurrent major depressive disorder (HCC)  F33.1     3. Insomnia, unspecified type  G47.00       Past Psychiatric History: Please see initial evaluation for full details. I have reviewed the history. No updates at this time.     Past Medical History:  Past Medical History:  Diagnosis Date   Aortic valve stenosis    a. Bicuspid AV - 2D Echo 06/2014 - mod AS, mild AI, mildly dilated aortic root.   Arthritis    Neck   CAD (coronary artery disease)    Carotid artery disease    a. Mild by duplex 05/2015 - 1-39% BICA. Repeat due 05/2017.   Depression    Diabetes mellitus without complication (HCC)    Dilated aortic root    a. By echo 06/2014.   Dyspnea    with exercion   Dysrhythmia    Atrial Fibrilation   Environmental allergies  Headache    MIgraines- rare   Heart murmur    Hypercholesterolemia    Hypertension    Obesity (BMI 30-39.9) 04/26/2018   PTSD (post-traumatic stress disorder)    Sleep apnea    CPAP   SVT (supraventricular tachycardia)    a. h/o SVT - Ablation done 2013.    Past Surgical History:  Procedure Laterality Date   AORTIC VALVE REPLACEMENT N/A 10/04/2020   Procedure: AORTIC VALVE REPLACEMENT (AVR);  Surgeon: Lucas Dorise POUR, MD;  Location: [facility] OR;  Service: Open Heart Surgery;  Laterality: N/A;   CARDIAC ELECTROPHYSIOLOGY STUDY AND ABLATION  08/21/2011   COLONOSCOPY WITH PROPOFOL  N/A 06/26/2017   Procedure: COLONOSCOPY WITH PROPOFOL ;  Surgeon: Viktoria Lamar DASEN, MD;  Location: [facility] ENDOSCOPY;  Service: Endoscopy;  Laterality: N/A;   ESOPHAGOGASTRODUODENOSCOPY (EGD) WITH PROPOFOL  N/A 06/26/2017   Procedure: ESOPHAGOGASTRODUODENOSCOPY (EGD) WITH PROPOFOL ;  Surgeon: Viktoria Lamar DASEN, MD;  Location: [facility] ENDOSCOPY;  Service: Endoscopy;  Laterality: N/A;   MAZE N/A 10/04/2020   Procedure: MAZE;  Surgeon: Lucas Dorise POUR, MD;  Location: [facility] OR;  Service: Open Heart Surgery;  Laterality: N/A;   RIGHT/LEFT HEART CATH AND CORONARY ANGIOGRAPHY N/A 11/16/2019   Procedure: RIGHT/LEFT HEART CATH AND CORONARY ANGIOGRAPHY;  Surgeon: Claudene Victory ORN, MD;  Location: [facility] INVASIVE CV LAB;  Service: Cardiovascular;  Laterality: N/A;   SEPTOPLASTY N/A 03/22/2015   Procedure: SEPTOPLASTY  WITH RIGHT INFERIOR TURBINATE REDUCTION;  Surgeon: Deward Argue, MD;  Location: [facility]GERY [facility];  Service: ENT;  Laterality: N/A;   SUPRAVENTRICULAR TACHYCARDIA ABLATION N/A 08/21/2011   Procedure: SUPRAVENTRICULAR TACHYCARDIA ABLATION;  Surgeon: Danelle ORN Birmingham, MD;  Location: [facility] CATH LAB;  Service: Cardiovascular;  Laterality: N/A;   surgery for non descending testicle     TEE WITHOUT CARDIOVERSION N/A 11/16/2019   Procedure: TRANSESOPHAGEAL ECHOCARDIOGRAM (TEE);  Surgeon: Barbaraann Darryle Ned, MD;  Location: [facility] ENDOSCOPY;  Service: Cardiovascular;  Laterality: N/A;   TEE WITHOUT CARDIOVERSION N/A 10/04/2020   Procedure: TRANSESOPHAGEAL ECHOCARDIOGRAM (TEE);  Surgeon: Lucas Dorise POUR, MD;  Location: [facility] OR;  Service: Open Heart Surgery;  Laterality: N/A;   TONSILLECTOMY      Family Psychiatric History: Please see initial evaluation for full details. I have reviewed the history. No updates at this time.     Family History:  Family History  Problem Relation Age of Onset   Diabetes Mother    Heart disease Mother    Arthritis Mother    Heart attack Father    Kidney disease Father    Cancer Father    Breast cancer Maternal Grandmother    Cancer Maternal Grandfather    Aortic stenosis Maternal Grandfather     Social History:  Social History   Socioeconomic History   Marital status: Married    Spouse name: Not on file   Number of children: 2   Years of education: Not on file   Highest  education level: 12th grade  Occupational History   Occupation: truck Air Traffic Controller: Office Manager  Tobacco Use   Smoking status: Former    Current packs/day: 0.00    Types: Cigarettes    Quit date: 11/04/1988    Years since quitting: 35.5   Smokeless tobacco: Former  Building Services Engineer status: Never Used  Substance and Sexual Activity   Alcohol use: Yes    Comment: occasionally   Drug use: Not Currently    Comment: many years ago in highschool   Sexual activity: Not Currently  Other Topics Concern   Not on file  Social History Narrative   Very rare exercise   Social Drivers of Health   Financial Resource Strain: High Risk (08/05/2023)   Overall Financial Resource Strain (CARDIA)    Difficulty of Paying Living Expenses: Hard  Food Insecurity: Patient Declined (08/05/2023)   Hunger Vital Sign    Worried About Running Out of Food in the Last Year: Patient declined    Ran Out of Food in the Last Year: Patient declined  Transportation Needs: No Transportation Needs (08/05/2023)   PRAPARE - Administrator, Civil Service (Medical): No    Lack of Transportation (Non-Medical): No  Physical Activity: Patient Declined (08/05/2023)   Exercise Vital Sign    Days of Exercise per Week: Patient declined    Minutes of Exercise per Session: Patient declined  Stress: No Stress Concern Present (08/05/2023)   [employer] of Occupational Health - Occupational Stress Questionnaire    Feeling of Stress : Only a little  Social Connections: Unknown (08/05/2023)   Social Connection and Isolation Panel    Frequency of Communication with Friends and Family: More than three times a week    Frequency of Social Gatherings with Friends and Family: Patient declined    Attends Religious Services: Patient declined    Database Administrator or Organizations: Yes    Attends Banker Meetings: Patient declined    Marital Status: Married    Allergies:  Allergies  Allergen  Reactions   Tomato Hives and Itching    Only allergic to raw tomato  Solanum (organism)    Metabolic Disorder Labs: Lab Results  Component Value Date   HGBA1C 7.9 (H) 12/08/2023   MPG 128.37 09/20/2020   No results found for: PROLACTIN Lab Results  Component Value Date   CHOL 138 12/08/2023   TRIG 179.0 (H) 12/08/2023   HDL 35.70 (L) 12/08/2023   CHOLHDL 4 12/08/2023   VLDL 35.8 12/08/2023   LDLCALC 66 12/08/2023   LDLCALC 56 08/21/2023   Lab Results  Component Value Date   TSH 1.63 08/21/2023   TSH 1.49 02/02/2023    Therapeutic Level Labs: No results found for: LITHIUM No results found for: VALPROATE No results found for: CBMZ  Current Medications: Current Outpatient Medications  Medication Sig Dispense Refill   ramelteon (ROZEREM) 8 MG tablet Take 1 tablet (8 mg total) by mouth at bedtime as needed for sleep. 30 tablet 1   Vilazodone  HCl 20 MG TABS Take 1 tablet (20 mg total) by mouth daily. 90 tablet 0   acetaminophen  (TYLENOL ) 500 MG tablet Take 1,000 mg by mouth every 6 (six) hours as needed for moderate pain or headache.     atorvastatin  (LIPITOR) 40 MG tablet TAKE 1 TABLET BY MOUTH DAILY 90 tablet 3   buPROPion  (WELLBUTRIN  XL) 300 MG 24 hr tablet Take 1 tablet (300 mg total) by mouth daily. 90 tablet 1   cetirizine (ZYRTEC) 10 MG tablet Take 10 mg by mouth 2 (two) times daily.      CONTOUR NEXT TEST test strip USE AS DIRECTED TO CHECK BLOOD SUGARS TWICE DAILY 100 strip 1   digoxin  (LANOXIN ) 0.125 MG tablet Take 1 tablet (0.125 mg total) by mouth daily. 90 tablet 3   ELIQUIS  5 MG TABS tablet TAKE 1 TABLET BY MOUTH TWICE  DAILY 180 tablet 3   FARXIGA  10 MG TABS tablet TAKE 1 TABLET BY MOUTH DAILY 60 tablet 5   fluticasone  (FLONASE ) 50  MCG/ACT nasal spray SHAKE LIQUID AND USE 2 SPRAYS IN EACH NOSTRIL DAILY AS NEEDED FOR ALLERGIES OR RHINITIS 48 g 1   gabapentin (NEURONTIN) 300 MG capsule Take 300 mg by mouth 3 (three) times daily.     Lancets (ONETOUCH  DELICA PLUS LANCET33G) MISC USE TWICE DAILY AS DIRECTED 100 each 7   metFORMIN  (GLUCOPHAGE ) 500 MG tablet TAKE 1 TABLET BY MOUTH TWICE  DAILY 180 tablet 3   metoprolol  succinate (TOPROL -XL) 25 MG 24 hr tablet TAKE 1 TABLET BY MOUTH DAILY 90 tablet 3   NON FORMULARY as directed. CPAP     OZEMPIC , 1 MG/DOSE, 4 MG/3ML SOPN INJECT SUBCUTANEOUSLY 1 MG EVERY WEEK 6 mL 5   sacubitril -valsartan  (ENTRESTO ) 24-26 MG Take 1 tablet by mouth 2 (two) times daily. 180 tablet 3   Vilazodone  HCl (VIIBRYD ) 10 MG TABS 5 mg daily for one week, then 10 mg daily 90 tablet 0   No current facility-administered medications for this visit.     Musculoskeletal: Strength & Muscle Tone: within normal limits Gait & Station: normal Patient leans: N/A  Psychiatric Specialty Exam: Review of Systems  Psychiatric/Behavioral:  Positive for decreased concentration, dysphoric mood, sleep disturbance and suicidal ideas. Negative for agitation, behavioral problems, confusion, hallucinations and self-injury. The patient is nervous/anxious. The patient is not hyperactive.   All other systems reviewed and are negative.   Blood pressure 119/85, pulse 81, temperature (!) 96.3 F (35.7 C), temperature source Temporal, height 6' 1 (1.854 m), weight 250 lb (113.4 kg).Body mass index is 32.98 kg/m.  General Appearance: Well Groomed  Eye Contact:  Good  Speech:  Clear and Coherent  Volume:  Normal  Mood:  Depressed  Affect:  Appropriate, Congruent, and down  Thought Process:  Coherent  Orientation:  Full (Time, Place, and Person)  Thought Content: Logical   Suicidal Thoughts:  Yes.  without intent/plan  Homicidal Thoughts:  No  Memory:  Immediate;   Good  Judgement:  Good  Insight:  Good  Psychomotor Activity:  Normal  Concentration:  Concentration: Good and Attention Span: Good  Recall:  Good  Fund of Knowledge: Good  Language: Good  Akathisia:  No  Handed:  Right  AIMS (if indicated): not done  Assets:   Communication Skills Desire for Improvement  ADL's:  Intact  Cognition: WNL  Sleep:  Poor   Screenings: GAD-7    Flowsheet Row Office Visit from 11/03/2023 in [facility] Office Visit from 03/19/2023 in [facility] Office Visit from 02/02/2023 in [facility] Office Visit from 07/21/2022 in [facility] Counselor from 07/27/2020 in [facility]  Total GAD-7 Score 12 10 11 13 13    PHQ2-9    Flowsheet Row Office Visit from 11/03/2023 in [facility] Most recent reading at 11/03/2023  3:56 PM Office Visit from 05/11/2023 in [facility] Most recent reading at 05/11/2023 11:09 AM Office Visit from 03/19/2023 in [facility] Most recent reading at 03/19/2023 10:12 AM Office Visit from 02/02/2023 in [facility] Most recent reading at 02/02/2023 11:28 AM Office Visit from 02/02/2023 in [facility] Most recent reading at 02/02/2023  8:01 AM  PHQ-2 Total Score 4 4 3 3 3   PHQ-9 Total Score 14 16 14 12 13    Flowsheet Row Office Visit  from 07/21/2022 in [facility] Counselor from 05/14/2021 in [facility] Counselor from 03/05/2021 in [facility]  C-SSRS RISK CATEGORY Error: Q3, 4, or 5 should not be populated when Q2 is No No Risk Low Risk     Assessment and Plan:  KENNEN STAMMER is a 58 y.o. 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   1. PTSD (post-traumatic stress disorder) 2. 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  He denies any adverse reaction since cross tapering from sertraline  to Viibryd .  Will do further uptitration to optimize treatment for depression, and PTSD off label.  Will continue bupropion  as adjunctive treatment for depression. He will greatly benefit from CBT/EMDR; referral was made.  3. Insomnia, unspecified type He continues to experience initial and middle insomnia.  Will trial ramelteon as needed for insomnia.  May consider prazosin in the future.    4. Restless leg 6. Vitamin D deficiency - - using a new CPAP machine Unstable.  He prefers to discuss this with his primary provider to obtain labs, who he has an appointment this morning.      Last checked  EKG HR 80, QTc423msec Afib 01/2023  Lipid panels LDL 56 07/2023  HbA1c 8.1, on metformin  07/2023      Plan Increase viibryd  20 mg daily  Continue bupropion  300 mg daily Obtain lab (check ferritin, vitamin D) Start ramelteon 8 mg at night as needed for insomnia Next appointment: 12/18 at 10 am, IP - on ozempic , metformin  (Was on quetiapine  150 mg at night)  Past trials of medication: citalopram , lamotrigine , quetiapine , Trazodone,     I have reviewed suicide assessment in detail. No change in the following assessment.    The patient demonstrates the following risk factors for suicide: Chronic risk factors for suicide include: psychiatric disorder of  depression, PTSD. Acute risk factors for suicide include: N/A. Protective factors for this patient include: positive social support, responsibility to others (children, family), coping skills and hope for the future. He is future oriented, and is amenable to treatment plans. Considering these factors, the overall suicide risk at this point appears to be moderate, but not at imminent risk. Patient is appropriate for outpatient follow up. Although he has guns at home, those are locked, and only his wife has access to it.  Collaboration of Care: Collaboration of Care: Other reviewed notes in Epic  Patient/Guardian was advised Release of Information must be obtained prior to any record release in order to collaborate their care with an outside provider. Patient/Guardian was advised if they have not already done so to contact the registration department to sign all necessary forms in order for us  to release information regarding their care.   Consent: Patient/Guardian gives verbal consent for treatment and assignment of benefits for services provided during this visit. Patient/Guardian expressed understanding and agreed to proceed.    [place],  MD 05/10/2024, 9:20 AM

## 2024-05-07 ENCOUNTER — Other Ambulatory Visit: Payer: Self-pay | Admitting: Psychiatry

## 2024-05-10 ENCOUNTER — Encounter: Payer: Self-pay | Admitting: Psychiatry

## 2024-05-10 ENCOUNTER — Other Ambulatory Visit: Payer: Self-pay

## 2024-05-10 ENCOUNTER — Ambulatory Visit: Admitting: Internal Medicine

## 2024-05-10 ENCOUNTER — Ambulatory Visit (INDEPENDENT_AMBULATORY_CARE_PROVIDER_SITE_OTHER): Admitting: Psychiatry

## 2024-05-10 VITALS — BP 118/70 | HR 88 | Temp 98.0°F | Ht 72.0 in | Wt 249.5 lb

## 2024-05-10 VITALS — BP 119/85 | HR 81 | Temp 96.3°F | Ht 73.0 in | Wt 250.0 lb

## 2024-05-10 DIAGNOSIS — F4310 Post-traumatic stress disorder, unspecified: Secondary | ICD-10-CM | POA: Diagnosis not present

## 2024-05-10 DIAGNOSIS — I4891 Unspecified atrial fibrillation: Secondary | ICD-10-CM

## 2024-05-10 DIAGNOSIS — G4700 Insomnia, unspecified: Secondary | ICD-10-CM

## 2024-05-10 DIAGNOSIS — K3189 Other diseases of stomach and duodenum: Secondary | ICD-10-CM

## 2024-05-10 DIAGNOSIS — G4733 Obstructive sleep apnea (adult) (pediatric): Secondary | ICD-10-CM

## 2024-05-10 DIAGNOSIS — Z0000 Encounter for general adult medical examination without abnormal findings: Secondary | ICD-10-CM | POA: Diagnosis not present

## 2024-05-10 DIAGNOSIS — E669 Obesity, unspecified: Secondary | ICD-10-CM

## 2024-05-10 DIAGNOSIS — E1165 Type 2 diabetes mellitus with hyperglycemia: Secondary | ICD-10-CM

## 2024-05-10 DIAGNOSIS — Z125 Encounter for screening for malignant neoplasm of prostate: Secondary | ICD-10-CM

## 2024-05-10 DIAGNOSIS — I5042 Chronic combined systolic (congestive) and diastolic (congestive) heart failure: Secondary | ICD-10-CM

## 2024-05-10 DIAGNOSIS — F331 Major depressive disorder, recurrent, moderate: Secondary | ICD-10-CM

## 2024-05-10 DIAGNOSIS — K766 Portal hypertension: Secondary | ICD-10-CM

## 2024-05-10 DIAGNOSIS — E785 Hyperlipidemia, unspecified: Secondary | ICD-10-CM

## 2024-05-10 DIAGNOSIS — G2581 Restless legs syndrome: Secondary | ICD-10-CM | POA: Diagnosis not present

## 2024-05-10 DIAGNOSIS — R3915 Urgency of urination: Secondary | ICD-10-CM

## 2024-05-10 DIAGNOSIS — I10 Essential (primary) hypertension: Secondary | ICD-10-CM

## 2024-05-10 DIAGNOSIS — E119 Type 2 diabetes mellitus without complications: Secondary | ICD-10-CM

## 2024-05-10 DIAGNOSIS — Z952 Presence of prosthetic heart valve: Secondary | ICD-10-CM

## 2024-05-10 DIAGNOSIS — E041 Nontoxic single thyroid nodule: Secondary | ICD-10-CM

## 2024-05-10 LAB — HEPATIC FUNCTION PANEL
ALT: 21 U/L (ref 0–53)
AST: 10 U/L (ref 0–37)
Albumin: 4.6 g/dL (ref 3.5–5.2)
Alkaline Phosphatase: 76 U/L (ref 39–117)
Bilirubin, Direct: 0.1 mg/dL (ref 0.0–0.3)
Total Bilirubin: 0.6 mg/dL (ref 0.2–1.2)
Total Protein: 6.6 g/dL (ref 6.0–8.3)

## 2024-05-10 LAB — LIPID PANEL
Cholesterol: 137 mg/dL (ref 0–200)
HDL: 33.8 mg/dL — ABNORMAL LOW (ref >39.00)
LDL Cholesterol: 58 mg/dL (ref 0–99)
NonHDL: 103.06
Total CHOL/HDL Ratio: 4
Triglycerides: 227 mg/dL — ABNORMAL HIGH (ref 0.0–149.0)
VLDL: 45.4 mg/dL — ABNORMAL HIGH (ref 0.0–40.0)

## 2024-05-10 LAB — CBC WITH DIFFERENTIAL/PLATELET
Basophils Absolute: 0.1 K/uL (ref 0.0–0.1)
Basophils Relative: 1.1 % (ref 0.0–3.0)
Eosinophils Absolute: 0.1 K/uL (ref 0.0–0.7)
Eosinophils Relative: 1.1 % (ref 0.0–5.0)
HCT: 44.8 % (ref 39.0–52.0)
Hemoglobin: 15 g/dL (ref 13.0–17.0)
Lymphocytes Relative: 23.5 % (ref 12.0–46.0)
Lymphs Abs: 1.3 K/uL (ref 0.7–4.0)
MCHC: 33.5 g/dL (ref 30.0–36.0)
MCV: 91.4 fl (ref 78.0–100.0)
Monocytes Absolute: 0.4 K/uL (ref 0.1–1.0)
Monocytes Relative: 7.1 % (ref 3.0–12.0)
Neutro Abs: 3.6 K/uL (ref 1.4–7.7)
Neutrophils Relative %: 67.2 % (ref 43.0–77.0)
Platelets: 162 K/uL (ref 150.0–400.0)
RBC: 4.91 Mil/uL (ref 4.22–5.81)
RDW: 14.3 % (ref 11.5–15.5)
WBC: 5.4 K/uL (ref 4.0–10.5)

## 2024-05-10 LAB — URINALYSIS, ROUTINE W REFLEX MICROSCOPIC
Bilirubin Urine: NEGATIVE
Hgb urine dipstick: NEGATIVE
Ketones, ur: NEGATIVE
Leukocytes,Ua: NEGATIVE
Nitrite: NEGATIVE
RBC / HPF: NONE SEEN (ref 0–?)
Specific Gravity, Urine: 1.015 (ref 1.000–1.030)
Total Protein, Urine: NEGATIVE
Urine Glucose: >=1000 — AB
Urobilinogen, UA: 0.2 (ref 0.0–1.0)
WBC, UA: NONE SEEN (ref 0–?)
pH: 6 (ref 5.0–8.0)

## 2024-05-10 LAB — BASIC METABOLIC PANEL WITH GFR
BUN: 11 mg/dL (ref 6–23)
CO2: 29 meq/L (ref 19–32)
Calcium: 9.4 mg/dL (ref 8.4–10.5)
Chloride: 100 meq/L (ref 96–112)
Creatinine, Ser: 1.09 mg/dL (ref 0.40–1.50)
GFR: 74.75 mL/min (ref >60.00)
Glucose, Bld: 351 mg/dL — ABNORMAL HIGH (ref 70–99)
Potassium: 4.4 meq/L (ref 3.5–5.1)
Sodium: 137 meq/L (ref 135–145)

## 2024-05-10 LAB — MICROALBUMIN / CREATININE URINE RATIO
Creatinine,U: 49.7 mg/dL
Microalb Creat Ratio: 21.8 mg/g (ref 0.0–30.0)
Microalb, Ur: 1.1 mg/dL (ref 0.0–1.9)

## 2024-05-10 LAB — HEMOGLOBIN A1C: Hgb A1c MFr Bld: 8.9 % — ABNORMAL HIGH (ref 4.6–6.5)

## 2024-05-10 MED ORDER — DAPAGLIFLOZIN PROPANEDIOL 10 MG PO TABS: 10.0000 mg | ORAL_TABLET | Freq: Every day | ORAL | 5 refills | Status: DC

## 2024-05-10 MED ORDER — VILAZODONE HCL 20 MG PO TABS: 20.0000 mg | ORAL_TABLET | Freq: Every day | ORAL | 0 refills | Status: DC

## 2024-05-10 MED ORDER — ONETOUCH DELICA PLUS LANCET33G MISC: 7 refills | Status: AC

## 2024-05-10 MED ORDER — CONTOUR NEXT TEST VI STRP: ORAL_STRIP | 1 refills | Status: AC

## 2024-05-10 MED ORDER — RAMELTEON 8 MG PO TABS: 8.0000 mg | ORAL_TABLET | Freq: Every evening | ORAL | 1 refills | Status: DC | PRN

## 2024-05-10 MED ORDER — METFORMIN HCL 500 MG PO TABS: 500.0000 mg | ORAL_TABLET | Freq: Two times a day (BID) | ORAL | 3 refills | Status: AC

## 2024-05-10 NOTE — Assessment & Plan Note (Addendum)
 Physical today 05/10/24.  Colonoscopy 06/2017.  PSA - 02/02/23 - .81. Due f/u psa. Appears to be overdue colonoscopy. Have placed GI referral previously. Need to confirm f/u with GI.

## 2024-05-10 NOTE — Patient Instructions (Signed)
 Increase viibryd  20 mg daily  Continue bupropion  300 mg daily Obtain lab (check ferritin, vitamin D) Start ramelteon 8 mg at night as needed for insomnia Next appointment: 12/18 at 10 am

## 2024-05-10 NOTE — Progress Notes (Signed)
 Subjective:    Patient ID: Dalton [name], male    DOB: 1966-05-02, 58 y.o.   MRN: [id number]  Patient here for  Chief Complaint  Patient presents with   Annual Exam    CPE    HPI Here for a physical exam. Saw psychaitry 03/15/24 - f/u PTSD and insomnia. Recommended cross taper from sertraline  to vibryd. Continue bupropion . Also discussed prazosin. Increased stress. Work stress. Discussed. Just met with psychiatry this am. Assures me he would not hurt himself. Had f/u with cardiology 01/22/24 - stable. Continue toprol , entresto , farxiga  and digoxin . Plan f/u echo next year - last 02/2023 - normally functioning valve. Continue eliquis , lipitor and BB. Continues on metformin  and farxiga . Has a history of sleep apnea. Using cpap. Reports increase fatigue. Discussed new mattress. Agreeable for referral to pulmonary for reevaluation.    Past Medical History:  Diagnosis Date   Aortic valve stenosis    a. Bicuspid AV - 2D Echo 06/2014 - mod AS, mild AI, mildly dilated aortic root.   Arthritis    Neck   CAD (coronary artery disease)    Carotid artery disease    a. Mild by duplex 05/2015 - 1-39% BICA. Repeat due 05/2017.   Depression    Diabetes mellitus without complication (HCC)    Dilated aortic root    a. By echo 06/2014.   Dyspnea    with exercion   Dysrhythmia    Atrial Fibrilation   Environmental allergies    Headache    MIgraines- rare   Heart murmur    Hypercholesterolemia    Hypertension    Obesity (BMI 30-39.9) 04/26/2018   PTSD (post-traumatic stress disorder)    Sleep apnea    CPAP   SVT (supraventricular tachycardia)    a. h/o SVT - Ablation done 2013.   Past Surgical History:  Procedure Laterality Date   AORTIC VALVE REPLACEMENT N/A 10/04/2020   Procedure: AORTIC VALVE REPLACEMENT (AVR);  Surgeon: Lucas Dorise POUR, MD;  Location: [facility] OR;  Service: Open Heart Surgery;  Laterality: N/A;   CARDIAC ELECTROPHYSIOLOGY STUDY AND ABLATION  08/21/2011   COLONOSCOPY WITH  PROPOFOL  N/A 06/26/2017   Procedure: COLONOSCOPY WITH PROPOFOL ;  Surgeon: Viktoria Lamar DASEN, MD;  Location: [facility] ENDOSCOPY;  Service: Endoscopy;  Laterality: N/A;   ESOPHAGOGASTRODUODENOSCOPY (EGD) WITH PROPOFOL  N/A 06/26/2017   Procedure: ESOPHAGOGASTRODUODENOSCOPY (EGD) WITH PROPOFOL ;  Surgeon: Viktoria Lamar DASEN, MD;  Location: [facility] ENDOSCOPY;  Service: Endoscopy;  Laterality: N/A;   MAZE N/A 10/04/2020   Procedure: MAZE;  Surgeon: Lucas Dorise POUR, MD;  Location: [facility] OR;  Service: Open Heart Surgery;  Laterality: N/A;   RIGHT/LEFT HEART CATH AND CORONARY ANGIOGRAPHY N/A 11/16/2019   Procedure: RIGHT/LEFT HEART CATH AND CORONARY ANGIOGRAPHY;  Surgeon: Claudene Victory ORN, MD;  Location: [facility] INVASIVE CV LAB;  Service: Cardiovascular;  Laterality: N/A;   SEPTOPLASTY N/A 03/22/2015   Procedure: SEPTOPLASTY  WITH RIGHT INFERIOR TURBINATE REDUCTION;  Surgeon: Deward Argue, MD;  Location: [facility] SURGERY [facility]TR;  Service: ENT;  Laterality: N/A;   SUPRAVENTRICULAR TACHYCARDIA ABLATION N/A 08/21/2011   Procedure: SUPRAVENTRICULAR TACHYCARDIA ABLATION;  Surgeon: Danelle ORN Birmingham, MD;  Location: [facility] CATH LAB;  Service: Cardiovascular;  Laterality: N/A;   surgery for non descending testicle     TEE WITHOUT CARDIOVERSION N/A 11/16/2019   Procedure: TRANSESOPHAGEAL ECHOCARDIOGRAM (TEE);  Surgeon: Barbaraann Darryle Ned, MD;  Location: [facility] ENDOSCOPY;  Service: Cardiovascular;  Laterality: N/A;   TEE WITHOUT CARDIOVERSION N/A 10/04/2020   Procedure: TRANSESOPHAGEAL ECHOCARDIOGRAM (TEE);  Surgeon: Lucas Dorise POUR, MD;  Location: [facility] OR;  Service: Open Heart Surgery;  Laterality: N/A;   TONSILLECTOMY     Family History  Problem Relation Age of Onset   Diabetes Mother    Heart disease Mother    Arthritis Mother    Heart attack Father    Kidney disease Father    Cancer Father    Breast cancer Maternal Grandmother    Cancer Maternal Grandfather    Aortic stenosis Maternal Grandfather    Social History   Socioeconomic  History   Marital status: Married    Spouse name: Not on file   Number of children: 2   Years of education: Not on file   Highest education level: 12th grade  Occupational History   Occupation: truck Air Traffic Controller: Office Manager  Tobacco Use   Smoking status: Former    Current packs/day: 0.00    Types: Cigarettes    Quit date: 11/04/1988    Years since quitting: 35.5   Smokeless tobacco: Former  Building Services Engineer status: Never Used  Substance and Sexual Activity   Alcohol use: Yes    Comment: occasionally   Drug use: Not Currently    Comment: many years ago in highschool   Sexual activity: Not Currently  Other Topics Concern   Not on file  Social History Narrative   Very rare exercise   Social Drivers of Health   Financial Resource Strain: High Risk (08/05/2023)   Overall Financial Resource Strain (CARDIA)    Difficulty of Paying Living Expenses: Hard  Food Insecurity: Patient Declined (08/05/2023)   Hunger Vital Sign    Worried About Running Out of Food in the Last Year: Patient declined    Ran Out of Food in the Last Year: Patient declined  Transportation Needs: No Transportation Needs (08/05/2023)   PRAPARE - Administrator, Civil Service (Medical): No    Lack of Transportation (Non-Medical): No  Physical Activity: Patient Declined (08/05/2023)   Exercise Vital Sign    Days of Exercise per Week: Patient declined    Minutes of Exercise per Session: Patient declined  Stress: No Stress Concern Present (08/05/2023)   [employer] of Occupational Health - Occupational Stress Questionnaire    Feeling of Stress : Only a little  Social Connections: Unknown (08/05/2023)   Social Connection and Isolation Panel    Frequency of Communication with Friends and Family: More than three times a week    Frequency of Social Gatherings with Friends and Family: Patient declined    Attends Religious Services: Patient declined    Database Administrator or  Organizations: Yes    Attends Banker Meetings: Patient declined    Marital Status: Married     Review of Systems  Constitutional:  Negative for appetite change and unexpected weight change.  HENT:  Negative for congestion, sinus pressure and sore throat.   Eyes:  Negative for pain and visual disturbance.  Respiratory:  Negative for cough, chest tightness and shortness of breath.   Cardiovascular:  Negative for chest pain, palpitations and leg swelling.  Gastrointestinal:  Negative for abdominal pain, diarrhea, nausea and vomiting.  Genitourinary:  Negative for difficulty urinating and dysuria.  Musculoskeletal:  Negative for joint swelling and myalgias.  Skin:  Negative for color change and rash.  Neurological:  Negative for dizziness and headaches.  Hematological:  Negative for adenopathy. Does not bruise/bleed easily.  Psychiatric/Behavioral:  Negative for agitation and  dysphoric mood.        Objective:     BP 118/70   Pulse 88   Temp 98 F (36.7 C) (Oral)   Ht 6' (1.829 m)   Wt 249 lb 8 oz (113.2 kg)   SpO2 98%   BMI 33.84 kg/m  Wt Readings from Last 3 Encounters:  05/10/24 249 lb 8 oz (113.2 kg)  01/22/24 254 lb (115.2 kg)  12/08/23 252 lb (114.3 kg)    Physical Exam Constitutional:      General: He is not in acute distress.    Appearance: Normal appearance. He is well-developed.  HENT:     Head: Normocephalic and atraumatic.     Right Ear: External ear normal.     Left Ear: External ear normal.     Mouth/Throat:     Pharynx: No oropharyngeal exudate or posterior oropharyngeal erythema.  Eyes:     General: No scleral icterus.       Right eye: No discharge.        Left eye: No discharge.     Conjunctiva/sclera: Conjunctivae normal.  Neck:     Thyroid : No thyromegaly.  Cardiovascular:     Rate and Rhythm: Normal rate and regular rhythm.  Pulmonary:     Effort: No respiratory distress.     Breath sounds: Normal breath sounds. No wheezing.   Abdominal:     General: Bowel sounds are normal.     Palpations: Abdomen is soft.     Tenderness: There is no abdominal tenderness.  Musculoskeletal:        General: No swelling or tenderness.     Cervical back: Neck supple. No tenderness.  Lymphadenopathy:     Cervical: No cervical adenopathy.  Skin:    Findings: No erythema or rash.  Neurological:     Mental Status: He is alert and oriented to person, place, and time.  Psychiatric:        Mood and Affect: Mood normal.        Behavior: Behavior normal.         Outpatient Encounter Medications as of 05/10/2024  Medication Sig   acetaminophen  (TYLENOL ) 500 MG tablet Take 1,000 mg by mouth every 6 (six) hours as needed for moderate pain or headache.   atorvastatin  (LIPITOR) 40 MG tablet TAKE 1 TABLET BY MOUTH DAILY   buPROPion  (WELLBUTRIN  XL) 300 MG 24 hr tablet Take 1 tablet (300 mg total) by mouth daily.   cetirizine (ZYRTEC) 10 MG tablet Take 10 mg by mouth 2 (two) times daily.    digoxin  (LANOXIN ) 0.125 MG tablet Take 1 tablet (0.125 mg total) by mouth daily.   ELIQUIS  5 MG TABS tablet TAKE 1 TABLET BY MOUTH TWICE  DAILY   fluticasone  (FLONASE ) 50 MCG/ACT nasal spray SHAKE LIQUID AND USE 2 SPRAYS IN EACH NOSTRIL DAILY AS NEEDED FOR ALLERGIES OR RHINITIS   gabapentin (NEURONTIN) 300 MG capsule Take 300 mg by mouth 3 (three) times daily.   metoprolol  succinate (TOPROL -XL) 25 MG 24 hr tablet TAKE 1 TABLET BY MOUTH DAILY   NON FORMULARY as directed. CPAP   ramelteon (ROZEREM) 8 MG tablet Take 1 tablet (8 mg total) by mouth at bedtime as needed for sleep.   sacubitril -valsartan  (ENTRESTO ) 24-26 MG Take 1 tablet by mouth 2 (two) times daily.   Vilazodone  HCl (VIIBRYD ) 10 MG TABS 5 mg daily for one week, then 10 mg daily   Vilazodone  HCl 20 MG TABS Take 1 tablet (20 mg total) by  mouth daily.   dapagliflozin  propanediol (FARXIGA ) 10 MG TABS tablet Take 1 tablet (10 mg total) by mouth daily.   glucose blood (CONTOUR NEXT TEST) test  strip Use as instructed   Lancets (ONETOUCH DELICA PLUS LANCET33G) MISC Use twice daily as directed.   metFORMIN  (GLUCOPHAGE ) 500 MG tablet Take 1 tablet (500 mg total) by mouth 2 (two) times daily.   [DISCONTINUED] CONTOUR NEXT TEST test strip USE AS DIRECTED TO CHECK BLOOD SUGARS TWICE DAILY   [DISCONTINUED] FARXIGA  10 MG TABS tablet TAKE 1 TABLET BY MOUTH DAILY   [DISCONTINUED] Lancets (ONETOUCH DELICA PLUS LANCET33G) MISC USE TWICE DAILY AS DIRECTED   [DISCONTINUED] metFORMIN  (GLUCOPHAGE ) 500 MG tablet TAKE 1 TABLET BY MOUTH TWICE  DAILY   [DISCONTINUED] OZEMPIC , 1 MG/DOSE, 4 MG/3ML SOPN INJECT SUBCUTANEOUSLY 1 MG EVERY WEEK   [DISCONTINUED] sertraline  (ZOLOFT ) 100 MG tablet Take 1.5 tablets (150 mg total) by mouth at bedtime. (Patient not taking: Reported on 03/15/2024)   No facility-administered encounter medications on file as of 05/10/2024.     Lab Results  Component Value Date   WBC 5.4 05/10/2024   HGB 15.0 05/10/2024   HCT 44.8 05/10/2024   PLT 162.0 05/10/2024   GLUCOSE 351 (H) 05/10/2024   CHOL 137 05/10/2024   TRIG 227.0 (H) 05/10/2024   HDL 33.80 (L) 05/10/2024   LDLDIRECT 75.0 03/12/2022   LDLCALC 58 05/10/2024   ALT 21 05/10/2024   AST 10 05/10/2024   NA 137 05/10/2024   K 4.4 05/10/2024   CL 100 05/10/2024   CREATININE 1.09 05/10/2024   BUN 11 05/10/2024   CO2 29 05/10/2024   TSH 1.63 08/21/2023   PSA 0.67 05/10/2024   INR 1.4 (H) 10/04/2020   HGBA1C 8.9 (H) 05/10/2024   MICROALBUR 1.1 05/10/2024    EXERCISE TOLERANCE TEST (ETT) Result Date: 02/13/2023   Baseline ECG demonstrates atrial fibrillation without significant ST/T abnormalities.   The patient exhibits good functional capacity with normal heart rate and blood pressure responses.  No angina was reported.   1.5-2 mm downsloping ST depressions and T wave inversions are seen in the inferolateral leads during peak stress.   Rare PVC's occurred during stress.  Atrial fibrillation persisted throughout stress  and recovery.   Intermediate risk stress test (Duke Treadmill Score -1 to +1.5). Intermediate risk exercise tolerance test with 1.5-2 mm downsloping ST depressions and T wave inversions in the inferolateral leads at peak stress.  Persistent atrial fibrillation noted.       Assessment & Plan:  Prostate cancer screening -     PSA  Type 2 diabetes mellitus with hyperglycemia, without long-term current use of insulin  (HCC) Assessment & Plan: Continues on metformin  and Farxiga .  Low carb diet and exercise. Follow met b and A1c today.  Off Ozempic . Check labs today.   Orders: -     Microalbumin / creatinine urine ratio -     Hemoglobin A1c -     Basic metabolic panel with GFR  Atrial fibrillation, unspecified type [facility]) Assessment & Plan: On eliquis , digoxin  and metoprolol .  Followed by cardiology.  Stable. Heart rate controlled.   Orders: -     CBC with Differential/Platelet  Hyperlipidemia, unspecified hyperlipidemia type Assessment & Plan: Low-cholesterol diet and exercise.  Lipitor.  Follow lipid panel.  Check with fasting labs today.  Orders: -     Hepatic function panel -     Lipid panel  Healthcare maintenance Assessment & Plan: Physical today 05/10/24.  Colonoscopy 06/2017.  PSA - 02/02/23 - .81. Due f/u psa. Appears to be overdue colonoscopy. Have placed GI referral previously. Need to confirm f/u with GI.    Restless leg -     VITAMIN D 25 Hydroxy (Vit-D Deficiency, Fractures) -     IBC + Ferritin  Urinary urgency -     Urinalysis, Routine w reflex microscopic -     Urine Culture  Thyroid  nodule Assessment & Plan: Noted on CT.  Is s/p thyroid  biopsy.  Biopsy negative. Previous ultrasound - multinodular thyroid . Confirm no f/u warranted. Follow tsh.    Obstructive sleep apnea syndrome Assessment & Plan: Wearing cpap. Having some issues with increased fatigue. Would like reevaluation. F/u with pulmonary.   Orders: -     Pulmonary Visit  S/P AVR Assessment &  Plan: Saw cardiology 01/19/23 - f/u chronic combined systolic and diastolic HF with EF 40-45%, afib and s/p AVR. Continues on metoprolol , entrestro, farxiga , eliquis  and digoxin .  Had f/u echo 02/18/23 - EF 40-45% with normal RV size and function  no significant valvular heart disease. Stable. Recommended f/u next year.    PTSD (post-traumatic stress disorder) Assessment & Plan: Seen Dr. Vickey.  ncrease stress as outlined. Discussed. Saw psychiatry this am. Continue follow-up with psychiatry. Now on vibryd and wellbutrin .    Portal hypertensive gastropathy [facility]) Assessment & Plan: Found on EGD 06/2017.  No upper symptoms.  Fatty liver on ultrasound.     Obesity, diabetes, and hypertension syndrome (HCC) Assessment & Plan: Low carb diet and exercise. Follow.    Essential hypertension Assessment & Plan: Blood pressures doing well on Entresto  and metoprolol .  Continue to follow pressures.  Check metabolic panel today.   Chronic combined systolic and diastolic heart failure (HCC) Assessment & Plan: Continues Entresto , Toprol  and Lanoxin .  Breathing stable.  Continues follow-up with cardiology.  Echocardiogram 02/2023 revealed ejection fraction 40 to 45% with normal RV size and function.  No significant valvular disease. No evidence of volume overload on exam. Follow.    Other orders -     Contour Next Test; Use as instructed  Dispense: 100 strip; Refill: 1 -     Dapagliflozin  Propanediol; Take 1 tablet (10 mg total) by mouth daily.  Dispense: 60 tablet; Refill: 5 -     OneTouch Delica Plus Lancet33G; Use twice daily as directed.  Dispense: 100 each; Refill: 7 -     metFORMIN  HCl; Take 1 tablet (500 mg total) by mouth 2 (two) times daily.  Dispense: 180 tablet; Refill: 3     Allena Hamilton, MD

## 2024-05-11 LAB — URINE CULTURE
MICRO NUMBER:: 17188515
Result:: NO GROWTH
SPECIMEN QUALITY:: ADEQUATE

## 2024-05-11 LAB — VITAMIN D 25 HYDROXY (VIT D DEFICIENCY, FRACTURES): VITD: 30.08 ng/mL (ref 30.00–100.00)

## 2024-05-11 LAB — IBC + FERRITIN
Ferritin: 53 ng/mL (ref 22.0–322.0)
Iron: 111 ug/dL (ref 42–165)
Saturation Ratios: 32.6 % (ref 20.0–50.0)
TIBC: 340.2 ug/dL (ref 250.0–450.0)
Transferrin: 243 mg/dL (ref 212.0–360.0)

## 2024-05-11 LAB — PSA: PSA: 0.67 ng/mL (ref 0.10–4.00)

## 2024-05-12 ENCOUNTER — Ambulatory Visit: Payer: Self-pay | Admitting: Internal Medicine

## 2024-05-16 ENCOUNTER — Encounter: Payer: Self-pay | Admitting: Internal Medicine

## 2024-05-16 NOTE — Assessment & Plan Note (Signed)
 Low-cholesterol diet and exercise.  Lipitor.  Follow lipid panel.  Check with fasting labs today.

## 2024-05-16 NOTE — Assessment & Plan Note (Signed)
 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 Continues on metoprolol , entrestro, farxiga , eliquis  and digoxin .  Had f/u echo 02/18/23 - EF 40-45% with normal RV size and function  no significant valvular heart disease. Stable. Recommended f/u next year.

## 2024-05-16 NOTE — Assessment & Plan Note (Signed)
Found on EGD 06/2017.  No upper symptoms.  Fatty liver on ultrasound.   °

## 2024-05-16 NOTE — Assessment & Plan Note (Signed)
Low carb diet and exercise.  Follow.  

## 2024-05-16 NOTE — Assessment & Plan Note (Signed)
 Continues on metformin  and Farxiga .  Low carb diet and exercise. Follow met b and A1c today.  Off Ozempic . Check labs today.

## 2024-05-16 NOTE — Assessment & Plan Note (Signed)
 Noted on CT.  Is s/p thyroid  biopsy.  Biopsy negative. Previous ultrasound - multinodular thyroid . Confirm no f/u warranted. Follow tsh.

## 2024-05-16 NOTE — Assessment & Plan Note (Signed)
 On eliquis , digoxin  and metoprolol .  Followed by cardiology.  Stable. Heart rate controlled.

## 2024-05-16 NOTE — Assessment & Plan Note (Signed)
 Continues Entresto , Toprol  and Lanoxin .  Breathing stable.  Continues follow-up with cardiology.  Echocardiogram 02/2023 revealed ejection fraction 40 to 45% with normal RV size and function.  No significant valvular disease. No evidence of volume overload on exam. Follow.

## 2024-05-16 NOTE — Assessment & Plan Note (Signed)
 Wearing cpap. Having some issues with increased fatigue. Would like reevaluation. F/u with pulmonary.

## 2024-05-16 NOTE — Assessment & Plan Note (Signed)
 Seen Dr. Vickey.  ncrease stress as outlined. Discussed. Saw psychiatry this am. Continue follow-up with psychiatry. Now on vibryd and wellbutrin .

## 2024-05-16 NOTE — Assessment & Plan Note (Signed)
 Blood pressures doing well on Entresto  and metoprolol .  Continue to follow pressures.  Check metabolic panel today.

## 2024-05-23 ENCOUNTER — Ambulatory Visit: Admitting: Sleep Medicine

## 2024-05-23 ENCOUNTER — Encounter: Payer: Self-pay | Admitting: Internal Medicine

## 2024-05-23 ENCOUNTER — Encounter: Payer: Self-pay | Admitting: Cardiovascular Disease

## 2024-05-23 ENCOUNTER — Encounter: Payer: Self-pay | Admitting: Sleep Medicine

## 2024-05-23 VITALS — BP 120/80 | HR 76 | Temp 98.0°F | Ht 73.0 in | Wt 250.2 lb

## 2024-05-23 DIAGNOSIS — I10 Essential (primary) hypertension: Secondary | ICD-10-CM

## 2024-05-23 DIAGNOSIS — G4733 Obstructive sleep apnea (adult) (pediatric): Secondary | ICD-10-CM | POA: Diagnosis not present

## 2024-05-23 NOTE — Progress Notes (Signed)
 Name:Dalton [name] MRN: [id number] DOB: 04-Jun-1966   CHIEF COMPLAINT:  CPAP F/U   HISTORY OF PRESENT ILLNESS: Dalton [name] is a 58 y.o. w/ a h/o OSA, HTN, DMII, hyperlipidemia and obesity who presents for CPAP F/U visit. Reports using CPAP therapy every night, which is confirmed by compliance data. Reports difficulty with sleep maintenance due to significant mask discomfort. States that he is currently using the Simplus FFM.    EPWORTH SLEEP SCORE 11    07/20/2023    9:07 AM  Results of the Epworth flowsheet  Sitting and reading 2  Watching TV 2  Sitting, inactive in a public place (e.g. a theatre or a meeting) 2  As a passenger in a car for an hour without a break 0  Lying down to rest in the afternoon when circumstances permit 3  Sitting and talking to someone 0  Sitting quietly after a lunch without alcohol 2  In a car, while stopped for a few minutes in traffic 0  Total score 11    PAST MEDICAL HISTORY :   has a past medical history of Aortic valve stenosis, Arthritis, CAD (coronary artery disease), Carotid artery disease, Depression, Diabetes mellitus without complication (HCC), Dilated aortic root, Dyspnea, Dysrhythmia, Environmental allergies, Headache, Heart murmur, Hypercholesterolemia, Hypertension, Obesity (BMI 30-39.9) (04/26/2018), PTSD (post-traumatic stress disorder), Sleep apnea, and SVT (supraventricular tachycardia).  has a past surgical history that includes Tonsillectomy; surgery for non descending testicle; Cardiac electrophysiology study and ablation (08/21/2011); supraventricular tachycardia ablation (N/A, 08/21/2011); Septoplasty (N/A, 03/22/2015); Esophagogastroduodenoscopy (egd) with propofol  (N/A, 06/26/2017); Colonoscopy with propofol  (N/A, 06/26/2017); RIGHT/LEFT HEART CATH AND CORONARY ANGIOGRAPHY (N/A, 11/16/2019); TEE without cardioversion (N/A, 11/16/2019); Aortic valve replacement (N/A, 10/04/2020); MAZE (N/A, 10/04/2020); and TEE without  cardioversion (N/A, 10/04/2020). Prior to Admission medications   Medication Sig Start Date End Date Taking? Authorizing Provider  acetaminophen  (TYLENOL ) 500 MG tablet Take 1,000 mg by mouth every 6 (six) hours as needed for moderate pain or headache.   Yes [provider]  atorvastatin  (LIPITOR) 40 MG tablet TAKE 1 TABLET BY MOUTH DAILY 12/29/23  Yes Gollan, Timothy J, MD  buPROPion  (WELLBUTRIN  XL) 300 MG 24 hr tablet Take 1 tablet (300 mg total) by mouth daily. 05/01/24 10/28/24 Yes Vickey Mettle, MD  cetirizine (ZYRTEC) 10 MG tablet Take 10 mg by mouth 2 (two) times daily.    Yes [provider]  dapagliflozin  propanediol (FARXIGA ) 10 MG TABS tablet Take 1 tablet (10 mg total) by mouth daily. 05/10/24  Yes Glendia Shad, MD  digoxin  (LANOXIN ) 0.125 MG tablet Take 1 tablet (0.125 mg total) by mouth daily. 10/09/23  Yes Gollan, Timothy J, MD  ELIQUIS  5 MG TABS tablet TAKE 1 TABLET BY MOUTH TWICE  DAILY 12/18/23  Yes Gollan, Timothy J, MD  fluticasone  (FLONASE ) 50 MCG/ACT nasal spray SHAKE LIQUID AND USE 2 SPRAYS IN EACH NOSTRIL DAILY AS NEEDED FOR ALLERGIES OR RHINITIS 11/18/23  Yes Glendia Shad, MD  gabapentin (NEURONTIN) 300 MG capsule Take 300 mg by mouth 3 (three) times daily. 02/16/24  Yes [provider]  glucose blood (CONTOUR NEXT TEST) test strip Use as instructed 05/10/24  Yes Glendia Shad, MD  Lancets [facility] DELICA PLUS [place]) MISC Use twice daily as directed. 05/10/24  Yes Glendia Shad, MD  metFORMIN  (GLUCOPHAGE ) 500 MG tablet Take 1 tablet (500 mg total) by mouth 2 (two) times daily. 05/10/24  Yes Glendia Shad, MD  metoprolol  succinate (TOPROL -XL) 25 MG 24 hr tablet TAKE 1  TABLET BY MOUTH DAILY 02/08/24  Yes Gollan, Timothy J, MD  NON FORMULARY as directed. CPAP   Yes [provider]  ramelteon (ROZEREM) 8 MG tablet Take 1 tablet (8 mg total) by mouth at bedtime as needed for sleep. 05/10/24 07/09/24 Yes Vickey Mettle, MD  sacubitril -valsartan   (ENTRESTO ) 24-26 MG Take 1 tablet by mouth 2 (two) times daily. 02/03/24  Yes Gollan, Timothy J, MD  Vilazodone  HCl (VIIBRYD ) 10 MG TABS 5 mg daily for one week, then 10 mg daily 03/15/24  Yes Hisada, Reina, MD  Vilazodone  HCl 20 MG TABS Take 1 tablet (20 mg total) by mouth daily. 05/10/24 08/08/24 Yes Vickey Mettle, MD   Allergies  Allergen Reactions   Tomato Hives and Itching    Only allergic to raw tomato  Solanum (organism)    FAMILY HISTORY:  family history includes Aortic stenosis in his maternal grandfather; Arthritis in his mother; Breast cancer in his maternal grandmother; Cancer in his father and maternal grandfather; Diabetes in his mother; Heart attack in his father; Heart disease in his father, maternal grandfather, and mother; Hypertension in his father, maternal grandfather, and mother; Kidney disease in his father; Stroke in his mother. SOCIAL HISTORY:  reports that he quit smoking about 35 years ago. His smoking use included cigarettes. He has quit using smokeless tobacco. He reports current alcohol use. He reports that he does not currently use drugs.   Review of Systems:  Gen:  Denies  fever, sweats, chills weight loss  HEENT: Denies blurred vision, double vision, ear pain, eye pain, hearing loss, nose bleeds, sore throat Cardiac:  No dizziness, chest pain or heaviness, chest tightness,edema, No JVD Resp:   No cough, -sputum production, -shortness of breath,-wheezing, -hemoptysis,  Gi: Denies swallowing difficulty, stomach pain, nausea or vomiting, diarrhea, constipation, bowel incontinence Gu:  Denies bladder incontinence, burning urine Ext:   Denies Joint pain, stiffness or swelling Skin: Denies  skin rash, easy bruising or bleeding or hives Endoc:  Denies polyuria, polydipsia , polyphagia or weight change Psych:   Denies depression, insomnia or hallucinations  Other:  All other systems negative  VITAL SIGNS: BP 120/80   Pulse 76   Temp 98 F (36.7 C)   Ht 6' 1  (1.854 m)   Wt 250 lb 3.2 oz (113.5 kg)   SpO2 95%   BMI 33.01 kg/m    Physical Examination:   General Appearance: No distress  EYES PERRLA, EOM intact.   NECK Supple, No JVD Pulmonary: normal breath sounds, No wheezing.  CardiovascularNormal S1,S2.  No m/r/g.   Abdomen: Benign, Soft, non-tender. Skin:   warm, no rashes, no ecchymosis  Extremities: normal, no cyanosis, clubbing. Neuro:without focal findings,  speech normal  PSYCHIATRIC: Mood, affect within normal limits.   ASSESSMENT AND PLAN  OSA Patient is using and benefiting from CPAP therapy. Due to elevated AHI will increase max pressure to 16 cm H2O. For mask discomfort, will try patient on the [employer] F20 FFM. Discussed the consequences of untreated sleep apnea. Advised not to drive drowsy for safety of patient and others. Will follow up in 3 months.    HTN Stable, on current management. Following with PCP.    Patient  satisfied with Plan of action and management. All questions answered  I spent a total of 26 minutes reviewing chart data, face-to-face evaluation with the patient, counseling and coordination of care as detailed above.    Akul Leggette, M.D.  Sleep Medicine [facility]

## 2024-05-30 ENCOUNTER — Other Ambulatory Visit: Payer: Self-pay | Admitting: Psychiatry

## 2024-06-19 NOTE — Progress Notes (Unsigned)
 Virtual Visit via Video Note  I connected with Dalton [name] on 06/23/2024 at 10:00 AM EST by a video enabled telemedicine application and verified that I am speaking with the correct person using two identifiers.  Location: Patient: car Provider: home office Persons participated in the visit- patient, provider    I discussed the limitations of evaluation and management by telemedicine and the availability of in person appointments. The patient expressed understanding and agreed to proceed.     I discussed the assessment and treatment plan with the patient. The patient was provided an opportunity to ask questions and all were answered. The patient agreed with the plan and demonstrated an understanding of the instructions.   The patient was advised to call back or seek an in-person evaluation if the symptoms worsen or if the condition fails to improve as anticipated.   Katheren Sleet, MD    [facility] MD/PA/NP OP Progress Note  06/23/2024 10:35 AM Dalton [name]  MRN:  [id number]  Chief Complaint:  Chief Complaint  Patient presents with   Follow-up   HPI:  This is a follow-up appointment for PTSD, depression.  He states that he has been stressed.  He has anger issues, which was also reported by his wife.  He has been trying to survive through the work, 3 more years until retirement.  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  When he is off work, he states that he does not have any issues, and he feels good most of the time.  Thanksgiving was good except that he was overeating.  He had a good Christmas party last weekend.  He feels relaxed, and nothing seems to bother him on the days he does not work.  He continues to have initial and middle insomnia.  He kicks a lot and his wife had some bruises.  Psychoeducation was provided to sleeping separately, and remove items to ensure the safety at night.  He has occasional flashback.  He has dreams of being in the war, or in a different world.  He denies  SI, hallucinations.  His wife has been taking care of his medication, and reportedly they have not received 20 mg tab.  However, they have been taking 20 mg (10 mg two tabs) regularly.  He agrees with the plans as outlined.    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 He was in [place] after 911, no combat experience Support: wife Household: [name]/his wife  Marital status: married with his wife of six years Number of children: 2 daughters, age 85, 66,  3 grandchild (oldest in [place] )  Visit Diagnosis:    ICD-10-CM   1. PTSD (post-traumatic stress disorder)  F43.10     2. Moderate episode of recurrent major depressive disorder (HCC)  F33.1     3. Insomnia, unspecified type  G47.00       Past Psychiatric History: Please see initial evaluation for  full details. I have reviewed the history. No updates at this time.     Past Medical History:  Past Medical History:  Diagnosis Date   Aortic valve stenosis    a. Bicuspid AV - 2D Echo 06/2014 - mod AS, mild AI, mildly dilated aortic root.   Arthritis    Neck   CAD (coronary artery disease)    Carotid artery disease    a. Mild by duplex 05/2015 - 1-39% BICA. Repeat due 05/2017.   Depression    Diabetes mellitus without complication (HCC)    Dilated aortic root    a. By echo 06/2014.   Dyspnea    with exercion   Dysrhythmia    Atrial Fibrilation   Environmental allergies    Headache    MIgraines- rare   Heart murmur    Hypercholesterolemia    Hypertension    Obesity (BMI  30-39.9) 04/26/2018   PTSD (post-traumatic stress disorder)    Sleep apnea    CPAP   SVT (supraventricular tachycardia)    a. h/o SVT - Ablation done 2013.    Past Surgical History:  Procedure Laterality Date   AORTIC VALVE REPLACEMENT N/A 10/04/2020   Procedure: AORTIC VALVE REPLACEMENT (AVR);  Surgeon: Lucas Dorise POUR, MD;  [facility] OR;  Service: Open Heart Surgery;  Laterality: N/A;   CARDIAC ELECTROPHYSIOLOGY STUDY AND ABLATION  08/21/2011   COLONOSCOPY WITH PROPOFOL  N/A 06/26/2017   Procedure: COLONOSCOPY WITH PROPOFOL ;  Surgeon: Viktoria Lamar DASEN, MD;  Location: [facility] ENDOSCOPY;  Service: Endoscopy;  Laterality: N/A;   ESOPHAGOGASTRODUODENOSCOPY (EGD) WITH PROPOFOL  N/A 06/26/2017   Procedure: ESOPHAGOGASTRODUODENOSCOPY (EGD) WITH PROPOFOL ;  Surgeon: Viktoria Lamar DASEN, MD;  Location: [facility] ENDOSCOPY;  Service: Endoscopy;  Laterality: N/A;   MAZE N/A 10/04/2020   Procedure: MAZE;  Surgeon: Lucas Dorise POUR, MD;  Location: [facility] OR;  Service: Open Heart Surgery;  Laterality: N/A;   RIGHT/LEFT HEART CATH AND CORONARY ANGIOGRAPHY N/A 11/16/2019   Procedure: RIGHT/LEFT HEART CATH AND CORONARY ANGIOGRAPHY;  Surgeon: Claudene Victory ORN, MD;  Location: [facility] INVASIVE CV LAB;  Service: Cardiovascular;  Laterality: N/A;   SEPTOPLASTY N/A 03/22/2015   Procedure: SEPTOPLASTY  WITH RIGHT INFERIOR TURBINATE REDUCTION;  Surgeon: Deward Argue, MD;  Location: [facility]GERY [facility];  Service: ENT;  Laterality: N/A;   SUPRAVENTRICULAR TACHYCARDIA ABLATION N/A 08/21/2011   Procedure: SUPRAVENTRICULAR TACHYCARDIA ABLATION;  Surgeon: Danelle ORN Birmingham, MD;  Location: [facility] CATH LAB;  Service: Cardiovascular;  Laterality: N/A;   surgery for non descending testicle     TEE WITHOUT CARDIOVERSION N/A 11/16/2019   Procedure: TRANSESOPHAGEAL ECHOCARDIOGRAM (TEE);  Surgeon: Barbaraann Darryle Ned, MD;  Location: [facility] ENDOSCOPY;  Service: Cardiovascular;  Laterality: N/A;   TEE WITHOUT CARDIOVERSION N/A 10/04/2020   Procedure:  TRANSESOPHAGEAL ECHOCARDIOGRAM (TEE);  Surgeon: Lucas Dorise POUR, MD;  Location: [facility] OR;  Service: Open Heart Surgery;  Laterality: N/A;   TONSILLECTOMY      Family Psychiatric History: Please see initial evaluation for full details. I have reviewed the history. No updates at this time.     Family History:  Family History  Problem Relation Age of Onset   Diabetes Mother    Heart disease Mother    Arthritis Mother    Hypertension Mother    Stroke Mother    Heart attack Father    Kidney disease Father    Cancer Father    Heart disease Father    Hypertension Father    Breast cancer Maternal Grandmother    Cancer Maternal  Grandfather    Aortic stenosis Maternal Grandfather    Heart disease Maternal Grandfather    Hypertension Maternal Grandfather     Social History:  Social History   Socioeconomic History   Marital status: Married    Spouse name: Not on file   Number of children: 2   Years of education: Not on file   Highest education level: 12th grade  Occupational History   Occupation: truck Air Traffic Controller: [employer]  Tobacco Use   Smoking status: Former    Current packs/day: 0.00    Types: Cigarettes    Quit date: 11/04/1988    Years since quitting: 35.6   Smokeless tobacco: Former  Building Services Engineer status: Never Used  Substance and Sexual Activity   Alcohol use: Yes    Comment: occasionally   Drug use: Not Currently    Comment: many years ago in highschool   Sexual activity: Not Currently  Other Topics Concern   Not on file  Social History Narrative   Very rare exercise   Social Drivers of Health   Tobacco Use: Medium Risk (06/23/2024)   Patient History    Smoking Tobacco Use: Former    Smokeless Tobacco Use: Former    Passive Exposure: Not on Actuary Strain: High Risk (08/05/2023)   Overall Financial Resource Strain (CARDIA)    Difficulty of Paying Living Expenses: Hard  Food Insecurity: Patient Declined (08/05/2023)    Hunger Vital Sign    Worried About Running Out of Food in the Last Year: Patient declined    Ran Out of Food in the Last Year: Patient declined  Transportation Needs: No Transportation Needs (08/05/2023)   PRAPARE - Administrator, Civil Service (Medical): No    Lack of Transportation (Non-Medical): No  Physical Activity: Patient Declined (08/05/2023)   Exercise Vital Sign    Days of Exercise per Week: Patient declined    Minutes of Exercise per Session: Patient declined  Stress: No Stress Concern Present (08/05/2023)   [employer] of Occupational Health - Occupational Stress Questionnaire    Feeling of Stress : Only a little  Social Connections: Unknown (08/05/2023)   Social Connection and Isolation Panel    Frequency of Communication with Friends and Family: More than three times a week    Frequency of Social Gatherings with Friends and Family: Patient declined    Attends Religious Services: Patient declined    Active Member of Clubs or Organizations: Yes    Attends Banker Meetings: Patient declined    Marital Status: Married  Depression (PHQ2-9): High Risk (05/10/2024)   Depression (PHQ2-9)    PHQ-2 Score: 23  Alcohol Screen: Low Risk (08/05/2023)   Alcohol Screen    Last Alcohol Screening Score (AUDIT): 1  Housing: Unknown (08/05/2023)   Housing Stability Vital Sign    Unable to Pay for Housing in the Last Year: Patient declined    Number of Times Moved in the Last Year: 0    Homeless in the Last Year: No  Utilities: Not At Risk (03/19/2023)   Received from [facility] Utilities    Threatened with loss of utilities: No  Health Literacy: Not on file    Allergies: Allergies[1]  Metabolic Disorder Labs: Lab Results  Component Value Date   HGBA1C 8.9 (H) 05/10/2024   MPG 128.37 09/20/2020   No results found for: PROLACTIN Lab Results  Component Value Date   CHOL 137  05/10/2024   TRIG 227.0 (H) 05/10/2024   HDL 33.80 (L) 05/10/2024    CHOLHDL 4 05/10/2024   VLDL 45.4 (H) 05/10/2024   LDLCALC 58 05/10/2024   LDLCALC 66 12/08/2023   Lab Results  Component Value Date   TSH 1.63 08/21/2023   TSH 1.49 02/02/2023    Therapeutic Level Labs: No results found for: LITHIUM No results found for: VALPROATE No results found for: CBMZ  Current Medications: Current Outpatient Medications  Medication Sig Dispense Refill   acetaminophen  (TYLENOL ) 500 MG tablet Take 1,000 mg by mouth every 6 (six) hours as needed for moderate pain or headache.     atorvastatin  (LIPITOR) 40 MG tablet TAKE 1 TABLET BY MOUTH DAILY 90 tablet 3   buPROPion  (WELLBUTRIN  XL) 300 MG 24 hr tablet Take 1 tablet (300 mg total) by mouth daily. 90 tablet 1   cetirizine (ZYRTEC) 10 MG tablet Take 10 mg by mouth 2 (two) times daily.      dapagliflozin  propanediol (FARXIGA ) 10 MG TABS tablet Take 1 tablet (10 mg total) by mouth daily. 60 tablet 5   digoxin  (LANOXIN ) 0.125 MG tablet Take 1 tablet (0.125 mg total) by mouth daily. 90 tablet 3   ELIQUIS  5 MG TABS tablet TAKE 1 TABLET BY MOUTH TWICE  DAILY 180 tablet 3   fluticasone  (FLONASE ) 50 MCG/ACT nasal spray SHAKE LIQUID AND USE 2 SPRAYS IN EACH NOSTRIL DAILY AS NEEDED FOR ALLERGIES OR RHINITIS 48 g 1   gabapentin (NEURONTIN) 300 MG capsule Take 300 mg by mouth 3 (three) times daily.     glucose blood (CONTOUR NEXT TEST) test strip Use as instructed 100 strip 1   Lancets (ONETOUCH DELICA PLUS LANCET33G) MISC Use twice daily as directed. 100 each 7   metFORMIN  (GLUCOPHAGE ) 500 MG tablet Take 1 tablet (500 mg total) by mouth 2 (two) times daily. 180 tablet 3   metoprolol  succinate (TOPROL -XL) 25 MG 24 hr tablet TAKE 1 TABLET BY MOUTH DAILY 90 tablet 3   NON FORMULARY as directed. CPAP     ramelteon  (ROZEREM ) 8 MG tablet Take 1 tablet (8 mg total) by mouth at bedtime as needed for sleep. 30 tablet 1   sacubitril -valsartan  (ENTRESTO ) 24-26 MG Take 1 tablet by mouth 2 (two) times daily. 180 tablet 3    Vilazodone  HCl (VIIBRYD ) 10 MG TABS 5 mg daily for one week, then 10 mg daily 90 tablet 0   Vilazodone  HCl (VIIBRYD ) 40 MG TABS Take 1 tablet (40 mg total) by mouth daily. 90 tablet 0   Vilazodone  HCl 20 MG TABS Take 1 tablet (20 mg total) by mouth daily. (Patient not taking: Reported on 06/23/2024) 90 tablet 0   No current facility-administered medications for this visit.     Musculoskeletal: Strength & Muscle Tone: N/A Gait & Station: N/A Patient leans: N/A  Psychiatric Specialty Exam: Review of Systems  Psychiatric/Behavioral:  Positive for dysphoric mood and sleep disturbance. Negative for agitation, behavioral problems, confusion, decreased concentration, hallucinations, self-injury and suicidal ideas. The patient is nervous/anxious. The patient is not hyperactive.   All other systems reviewed and are negative.   There were no vitals taken for this visit.There is no height or weight on file to calculate BMI.  General Appearance: Well Groomed  Eye Contact:  Good  Speech:  Clear and Coherent  Volume:  Normal  Mood:  Irritable  Affect:  Appropriate, Congruent, and tense  Thought Process:  Coherent  Orientation:  Full (Time, Place, and Person)  Thought  Content: Logical   Suicidal Thoughts:  No  Homicidal Thoughts:  No  Memory:  Immediate;   Good  Judgement:  Good  Insight:  Good  Psychomotor Activity:  Normal  Concentration:  Concentration: Good and Attention Span: Good  Recall:  Good  Fund of Knowledge: Good  Language: Good  Akathisia:  No  Handed:  Right  AIMS (if indicated): not done  Assets:  Communication Skills Desire for Improvement  ADL's:  Intact  Cognition: WNL  Sleep:  Poor   Screenings: GAD-7    Flowsheet Row Office Visit from [facility]/10/2024 in [facility] Visit from 11/03/2023 in [facility] Office Visit from 03/19/2023 in [facility] Office Visit from 02/02/2023 in [facility] Office Visit from 07/21/2022 in [facility]  Total GAD-7 Score 17 12 10 11 13    PHQ2-9    Flowsheet Row Office Visit from 05/10/2024 in [facility] Visit from 11/03/2023 in [facility] Office Visit from 05/11/2023 in [facility] Office Visit from 03/19/2023 in [facility] Office Visit from 02/02/2023 in [facility]  PHQ-2 Total Score 6 4 4 3 3   PHQ-9 Total Score 23 14 16 14 12    Flowsheet Row Office Visit from 07/21/2022 in [facility] Counselor from 05/14/2021 in [facility] Counselor from 03/05/2021 in [facility]  C-SSRS RISK CATEGORY Error: Q3, 4, or 5 should not be populated when Q2 is No No Risk Low Risk     Assessment and Plan:  TEREL BANN is a 58 y.o. 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   1. PTSD (post-traumatic stress disorder) 2. 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  He has been reportedly taking higher dose of Viibryd , although  the record suggests he might have ran out if that is the case.  He was advised to recheck the dose he is taking, and is willing to try uptitration to optimize treatment for depression, PTSD off label.  Discussed potential risk of nausea.  Will continue bupropion  as adjunctive treatment for depression. He will greatly benefit from CBT/EMDR; referral was made.  3. Insomnia, unspecified type He continues to experience initial middle insomnia.  He appears to have REM sleep behavior; psychoeducation was provided to ensure safety for his partner/himself.  He was referred to sleep specialist; will defer the treatment at this time.  May consider prazosin in the future.   4. Restless leg 6. Vitamin D  deficiency - - using a new CPAP machine (struggling with leaking) - ferritin 53 05/2024 Unstable.  He prefers to discuss this with his primary provider to obtain labs, who he has an appointment this morning.  Last checked  EKG HR 80, QTc412msec Afib 01/2023  Lipid panels LDL 56 07/2023  HbA1c 8.1, on metformin  07/2023      Plan Increase viibryd  40 mg daily  Continue bupropion  300 mg daily Next appointment: 2/5 at 2:30,IP Referred to therapy onsite - on ozempic , metformin  (Was on quetiapine  150 mg at night)   Past trials of medication: citalopram , lamotrigine , quetiapine , Trazodone,     I have reviewed suicide assessment in detail. No change in the following assessment.    The patient demonstrates the following risk factors for suicide: Chronic risk factors for suicide include: psychiatric disorder of depression, PTSD. Acute risk factors for suicide include: N/A. Protective factors for this patient include: positive social support, responsibility to others (children, family), coping skills and hope for the future. He is future oriented, and is amenable to treatment plans. Considering these factors, the overall suicide risk at this point appears to be moderate, but not at imminent risk. Patient is  appropriate for outpatient follow up. Although he has guns at home, those are locked, and only his wife has access to it.  Collaboration of Care: Collaboration of Care: Other reviewed notes in Epic  Patient/Guardian was advised Release of Information must be obtained prior to any record release in order to collaborate their care with an outside provider. Patient/Guardian was advised if they have not already done so to contact the registration department to sign all necessary forms in order for us  to release information regarding their care.   Consent: Patient/Guardian gives verbal consent for treatment and assignment of benefits for services provided during this visit. Patient/Guardian expressed understanding and agreed to proceed.    Katheren Sleet, MD 06/23/2024, 10:35 AM     [1]  Allergies Allergen Reactions   Tomato Hives and Itching    Only allergic to raw tomato  Solanum (organism)

## 2024-06-23 ENCOUNTER — Telehealth: Admitting: Psychiatry

## 2024-06-23 ENCOUNTER — Encounter: Payer: Self-pay | Admitting: Psychiatry

## 2024-06-23 DIAGNOSIS — G2581 Restless legs syndrome: Secondary | ICD-10-CM | POA: Diagnosis not present

## 2024-06-23 DIAGNOSIS — F4310 Post-traumatic stress disorder, unspecified: Secondary | ICD-10-CM

## 2024-06-23 DIAGNOSIS — F331 Major depressive disorder, recurrent, moderate: Secondary | ICD-10-CM

## 2024-06-23 DIAGNOSIS — G4700 Insomnia, unspecified: Secondary | ICD-10-CM

## 2024-06-23 MED ORDER — VILAZODONE HCL 40 MG PO TABS: 40.0000 mg | ORAL_TABLET | Freq: Every day | ORAL | 0 refills | Status: DC

## 2024-07-27 ENCOUNTER — Other Ambulatory Visit: Payer: Self-pay | Admitting: Psychiatry

## 2024-07-27 ENCOUNTER — Other Ambulatory Visit: Payer: Self-pay | Admitting: Cardiovascular Disease

## 2024-07-29 NOTE — Telephone Encounter (Signed)
 Patient need make an appointment for further refills. Thank  1st attempt

## 2024-08-10 ENCOUNTER — Encounter: Payer: Self-pay | Admitting: Internal Medicine

## 2024-08-10 ENCOUNTER — Ambulatory Visit: Admitting: Internal Medicine

## 2024-08-10 DIAGNOSIS — E1165 Type 2 diabetes mellitus with hyperglycemia: Secondary | ICD-10-CM

## 2024-08-10 DIAGNOSIS — E785 Hyperlipidemia, unspecified: Secondary | ICD-10-CM

## 2024-08-10 LAB — HEPATIC FUNCTION PANEL
ALT: 27 U/L (ref 3–53)
AST: 16 U/L (ref 5–37)
Albumin: 4.8 g/dL (ref 3.5–5.2)
Alkaline Phosphatase: 80 U/L (ref 39–117)
Bilirubin, Direct: 0.2 mg/dL (ref 0.1–0.3)
Total Bilirubin: 0.9 mg/dL (ref 0.2–1.2)
Total Protein: 6.8 g/dL (ref 6.0–8.3)

## 2024-08-10 LAB — BASIC METABOLIC PANEL WITH GFR
BUN: 14 mg/dL (ref 6–23)
CO2: 28 meq/L (ref 19–32)
Calcium: 9.5 mg/dL (ref 8.4–10.5)
Chloride: 101 meq/L (ref 96–112)
Creatinine, Ser: 1.14 mg/dL (ref 0.40–1.50)
GFR: 70.7 mL/min
Glucose, Bld: 152 mg/dL — ABNORMAL HIGH (ref 70–99)
Potassium: 4.8 meq/L (ref 3.5–5.1)
Sodium: 139 meq/L (ref 135–145)

## 2024-08-10 LAB — LIPID PANEL
Cholesterol: 117 mg/dL (ref 28–200)
HDL: 33.1 mg/dL — ABNORMAL LOW
LDL Cholesterol: 49 mg/dL (ref 10–99)
NonHDL: 83.75
Total CHOL/HDL Ratio: 4
Triglycerides: 174 mg/dL — ABNORMAL HIGH (ref 10.0–149.0)
VLDL: 34.8 mg/dL (ref 0.0–40.0)

## 2024-08-10 LAB — HM DIABETES FOOT EXAM

## 2024-08-10 LAB — HEMOGLOBIN A1C: Hgb A1c MFr Bld: 8.9 % — ABNORMAL HIGH (ref 4.6–6.5)

## 2024-08-10 LAB — TSH: TSH: 1.94 u[IU]/mL (ref 0.35–5.50)

## 2024-08-10 MED ORDER — DAPAGLIFLOZIN PROPANEDIOL 10 MG PO TABS: 10.0000 mg | ORAL_TABLET | Freq: Every day | ORAL | 1 refills | Status: AC

## 2024-08-10 NOTE — Progress Notes (Unsigned)
 "  Subjective:    Patient ID: Dalton [name], male    DOB: [birth year], 59 y.o.   MRN: [id number]  Patient here for  Chief Complaint  Patient presents with   Medical Management of Chronic Issues    HPI Here for a scheduled follow up - follow up regarding afib, hypertension, diabetes and hypercholesterolemia. Had f/u with cardiology 01/22/24 - stable. Continue toprol , entresto , farxiga  and digoxin . Plan f/u echo next year - last 02/2023 - normally functioning valve. Continue eliquis , lipitor and BB. Continues on metformin  and farxiga . Has a history of sleep apnea. Using cpap. Saw Dr Jess 05/23/24. Pressure adjusted. Seeing psychiatry - f/u PTSD.    Due f/u colonoscopy.  F/u thyroid  ultrasound.   Past Medical History:  Diagnosis Date   Aortic valve stenosis    a. Bicuspid AV - 2D Echo 06/2014 - mod AS, mild AI, mildly dilated aortic root.   Arthritis    Neck   CAD (coronary artery disease)    Carotid artery disease    a. Mild by duplex 05/2015 - 1-39% BICA. Repeat due 05/2017.   Depression    Diabetes mellitus without complication (HCC)    Dilated aortic root    a. By echo 06/2014.   Dyspnea    with exercion   Dysrhythmia    Atrial Fibrilation   Environmental allergies    Headache    MIgraines- rare   Heart murmur    Hypercholesterolemia    Hypertension    Obesity (BMI 30-39.9) 04/26/2018   PTSD (post-traumatic stress disorder)    Sleep apnea    CPAP   SVT (supraventricular tachycardia)    a. h/o SVT - Ablation done 2013.   Past Surgical History:  Procedure Laterality Date   AORTIC VALVE REPLACEMENT N/A 10/04/2020   Procedure: AORTIC VALVE REPLACEMENT (AVR);  Surgeon: Lucas Dorise POUR, MD;  Location: [facility] OR;  Service: Open Heart Surgery;  Laterality: N/A;   CARDIAC ELECTROPHYSIOLOGY STUDY AND ABLATION  08/21/2011   COLONOSCOPY WITH PROPOFOL  N/A 06/26/2017   Procedure: COLONOSCOPY WITH PROPOFOL ;  Surgeon: Viktoria Lamar DASEN, MD;  Location: [facility] ENDOSCOPY;  Service:  Endoscopy;  Laterality: N/A;   ESOPHAGOGASTRODUODENOSCOPY (EGD) WITH PROPOFOL  N/A 06/26/2017   Procedure: ESOPHAGOGASTRODUODENOSCOPY (EGD) WITH PROPOFOL ;  Surgeon: Viktoria Lamar DASEN, MD;  Location: [facility] ENDOSCOPY;  Service: Endoscopy;  Laterality: N/A;   MAZE N/A 10/04/2020   Procedure: MAZE;  Surgeon: Lucas Dorise POUR, MD;  Location: [facility] OR;  Service: Open Heart Surgery;  Laterality: N/A;   RIGHT/LEFT HEART CATH AND CORONARY ANGIOGRAPHY N/A 11/16/2019   Procedure: RIGHT/LEFT HEART CATH AND CORONARY ANGIOGRAPHY;  Surgeon: Claudene Victory ORN, MD;  Location: [facility] INVASIVE CV LAB;  Service: Cardiovascular;  Laterality: N/A;   SEPTOPLASTY N/A 03/22/2015   Procedure: SEPTOPLASTY  WITH RIGHT INFERIOR TURBINATE REDUCTION;  Surgeon: Deward Argue, MD;  Location: [facility]GERY [facility];  Service: ENT;  Laterality: N/A;   SUPRAVENTRICULAR TACHYCARDIA ABLATION N/A 08/21/2011   Procedure: SUPRAVENTRICULAR TACHYCARDIA ABLATION;  Surgeon: Danelle ORN Birmingham, MD;  Location: [facility] CATH LAB;  Service: Cardiovascular;  Laterality: N/A;   surgery for non descending testicle     TEE WITHOUT CARDIOVERSION N/A 11/16/2019   Procedure: TRANSESOPHAGEAL ECHOCARDIOGRAM (TEE);  Surgeon: Barbaraann Darryle Ned, MD;  Location: [facility] ENDOSCOPY;  Service: Cardiovascular;  Laterality: N/A;   TEE WITHOUT CARDIOVERSION N/A 10/04/2020   Procedure: TRANSESOPHAGEAL ECHOCARDIOGRAM (TEE);  Surgeon: Lucas Dorise POUR, MD;  Location: [facility] OR;  Service: Open Heart Surgery;  Laterality: N/A;   TONSILLECTOMY  Family History  Problem Relation Age of Onset   Diabetes Mother    Heart disease Mother    Arthritis Mother    Hypertension Mother    Stroke Mother    Heart attack Father    Kidney disease Father    Cancer Father    Heart disease Father    Hypertension Father    Breast cancer Maternal Grandmother    Cancer Maternal Grandfather    Aortic stenosis Maternal Grandfather    Heart disease Maternal Grandfather    Hypertension Maternal Grandfather    Social  History   Socioeconomic History   Marital status: Married    Spouse name: Not on file   Number of children: 2   Years of education: Not on file   Highest education level: 12th grade  Occupational History   Occupation: truck Air Traffic Controller: Office Manager  Tobacco Use   Smoking status: Former    Current packs/day: 0.00    Types: Cigarettes    Quit date: 11/04/1988    Years since quitting: 35.7   Smokeless tobacco: Former  Building Services Engineer status: Never Used  Substance and Sexual Activity   Alcohol use: Yes    Comment: occasionally   Drug use: Not Currently    Comment: many years ago in highschool   Sexual activity: Not Currently  Other Topics Concern   Not on file  Social History Narrative   Very rare exercise   Social Drivers of Health   Tobacco Use: Medium Risk (08/10/2024)   Patient History    Smoking Tobacco Use: Former    Smokeless Tobacco Use: Former    Passive Exposure: Not on Actuary Strain: Low Risk (08/09/2024)   Overall Financial Resource Strain (CARDIA)    Difficulty of Paying Living Expenses: Not very hard  Food Insecurity: Patient Declined (08/09/2024)   Epic    Worried About Programme Researcher, Broadcasting/film/video in the Last Year: Patient declined    Barista in the Last Year: Patient declined  Transportation Needs: No Transportation Needs (08/09/2024)   Epic    Lack of Transportation (Medical): No    Lack of Transportation (Non-Medical): No  Physical Activity: Unknown (08/09/2024)   Exercise Vital Sign    Days of Exercise per Week: Patient declined    Minutes of Exercise per Session: Not on file  Stress: Stress Concern Present (08/09/2024)   [employer] of Occupational Health - Occupational Stress Questionnaire    Feeling of Stress: To some extent  Social Connections: Socially Integrated (08/09/2024)   Social Connection and Isolation Panel    Frequency of Communication with Friends and Family: More than three times a week    Frequency of  Social Gatherings with Friends and Family: More than three times a week    Attends Religious Services: 1 to 4 times per year    Active Member of Clubs or Organizations: Yes    Attends Banker Meetings: Never    Marital Status: Married  Depression (PHQ2-9): High Risk (05/10/2024)   Depression (PHQ2-9)    PHQ-2 Score: 23  Alcohol Screen: Low Risk (08/09/2024)   Alcohol Screen    Last Alcohol Screening Score (AUDIT): 1  Housing: Low Risk (08/09/2024)   Epic    Unable to Pay for Housing in the Last Year: No    Number of Times Moved in the Last Year: 0    Homeless in the Last Year: No  Utilities: Not  At Risk (03/19/2023)   Received from [facility] Utilities    Threatened with loss of utilities: No  Health Literacy: Not on file     Review of Systems     Objective:     BP 126/78   Pulse 71   Temp 97.8 F (36.6 C) (Oral)   Ht 6' 1 (1.854 m)   Wt 240 lb (108.9 kg)   SpO2 97%   BMI 31.66 kg/m  Wt Readings from Last 3 Encounters:  08/10/24 240 lb (108.9 kg)  05/23/24 250 lb 3.2 oz (113.5 kg)  05/10/24 249 lb 8 oz (113.2 kg)    Physical Exam  {Perform Simple Foot Exam  Perform Detailed exam:1} {Insert foot Exam (Optional):30965}   Outpatient Encounter Medications as of 08/10/2024  Medication Sig   acetaminophen  (TYLENOL ) 500 MG tablet Take 1,000 mg by mouth every 6 (six) hours as needed for moderate pain or headache.   atorvastatin  (LIPITOR) 40 MG tablet TAKE 1 TABLET BY MOUTH DAILY   buPROPion  (WELLBUTRIN  XL) 300 MG 24 hr tablet Take 1 tablet (300 mg total) by mouth daily.   cetirizine (ZYRTEC) 10 MG tablet Take 10 mg by mouth 2 (two) times daily.    dapagliflozin  propanediol (FARXIGA ) 10 MG TABS tablet Take 1 tablet (10 mg total) by mouth daily.   digoxin  (LANOXIN ) 0.125 MG tablet TAKE 1 TABLET BY MOUTH DAILY   ELIQUIS  5 MG TABS tablet TAKE 1 TABLET BY MOUTH TWICE  DAILY   fluticasone  (FLONASE ) 50 MCG/ACT nasal spray SHAKE LIQUID AND USE 2 SPRAYS IN  EACH NOSTRIL DAILY AS NEEDED FOR ALLERGIES OR RHINITIS   gabapentin (NEURONTIN) 300 MG capsule Take 300 mg by mouth 3 (three) times daily.   glucose blood (CONTOUR NEXT TEST) test strip Use as instructed   Lancets (ONETOUCH DELICA PLUS LANCET33G) MISC Use twice daily as directed.   metFORMIN  (GLUCOPHAGE ) 500 MG tablet Take 1 tablet (500 mg total) by mouth 2 (two) times daily.   metoprolol  succinate (TOPROL -XL) 25 MG 24 hr tablet TAKE 1 TABLET BY MOUTH DAILY   NON FORMULARY as directed. CPAP   ramelteon  (ROZEREM ) 8 MG tablet Take 1 tablet (8 mg total) by mouth at bedtime as needed for sleep.   sacubitril -valsartan  (ENTRESTO ) 24-26 MG Take 1 tablet by mouth 2 (two) times daily.   Vilazodone  HCl (VIIBRYD ) 40 MG TABS Take 1 tablet (40 mg total) by mouth daily.   [DISCONTINUED] Vilazodone  HCl (VIIBRYD ) 10 MG TABS 5 mg daily for one week, then 10 mg daily   [DISCONTINUED] Vilazodone  HCl 20 MG TABS Take 1 tablet (20 mg total) by mouth daily. (Patient not taking: Reported on 08/10/2024)   No facility-administered encounter medications on file as of 08/10/2024.     Lab Results  Component Value Date   WBC 5.4 05/10/2024   HGB 15.0 05/10/2024   HCT 44.8 05/10/2024   PLT 162.0 05/10/2024   GLUCOSE 351 (H) 05/10/2024   CHOL 137 05/10/2024   TRIG 227.0 (H) 05/10/2024   HDL 33.80 (L) 05/10/2024   LDLDIRECT 75.0 03/12/2022   LDLCALC 58 05/10/2024   ALT 21 05/10/2024   AST 10 05/10/2024   NA 137 05/10/2024   K 4.4 05/10/2024   CL 100 05/10/2024   CREATININE 1.09 05/10/2024   BUN 11 05/10/2024   CO2 29 05/10/2024   TSH 1.63 08/21/2023   PSA 0.67 05/10/2024   INR 1.4 (H) 10/04/2020   HGBA1C 8.9 (H) 05/10/2024   MICROALBUR 1.1  05/10/2024    EXERCISE TOLERANCE TEST (ETT) Result Date: 02/13/2023   Baseline ECG demonstrates atrial fibrillation without significant ST/T abnormalities.   The patient exhibits good functional capacity with normal heart rate and blood pressure responses.  No angina was  reported.   1.5-2 mm downsloping ST depressions and T wave inversions are seen in the inferolateral leads during peak stress.   Rare PVC's occurred during stress.  Atrial fibrillation persisted throughout stress and recovery.   Intermediate risk stress test ([facility] Treadmill Score -1 to +1.5). Intermediate risk exercise tolerance test with 1.5-2 mm downsloping ST depressions and T wave inversions in the inferolateral leads at peak stress.  Persistent atrial fibrillation noted.       Assessment & Plan:  Hyperlipidemia, unspecified hyperlipidemia type  Type 2 diabetes mellitus with hyperglycemia, without long-term current use of insulin  (HCC)     Allena Hamilton, MD "

## 2024-08-11 ENCOUNTER — Ambulatory Visit (INDEPENDENT_AMBULATORY_CARE_PROVIDER_SITE_OTHER): Admitting: Psychiatry

## 2024-08-11 ENCOUNTER — Other Ambulatory Visit: Payer: Self-pay

## 2024-08-11 ENCOUNTER — Encounter: Payer: Self-pay | Admitting: Psychiatry

## 2024-08-11 VITALS — BP 118/72 | HR 90 | Temp 97.8°F | Ht 73.0 in | Wt 248.0 lb

## 2024-08-11 DIAGNOSIS — G4700 Insomnia, unspecified: Secondary | ICD-10-CM

## 2024-08-11 DIAGNOSIS — F331 Major depressive disorder, recurrent, moderate: Secondary | ICD-10-CM | POA: Diagnosis not present

## 2024-08-11 DIAGNOSIS — F4310 Post-traumatic stress disorder, unspecified: Secondary | ICD-10-CM | POA: Diagnosis not present

## 2024-08-11 MED ORDER — BUPROPION HCL ER (XL) 150 MG PO TB24: 150.0000 mg | ORAL_TABLET | Freq: Every day | ORAL | 0 refills | Status: AC

## 2024-08-11 MED ORDER — VILAZODONE HCL 40 MG PO TABS: 40.0000 mg | ORAL_TABLET | Freq: Every day | ORAL | 0 refills | Status: AC

## 2024-08-11 NOTE — Patient Instructions (Signed)
 Continue viibryd  40 mg daily  Increase bupropion  450 mg daily Hold ramelteon  Next appointment: 4/13 at 3 PM

## 2024-08-23 ENCOUNTER — Ambulatory Visit: Admitting: Sleep Medicine

## 2024-08-24 ENCOUNTER — Ambulatory Visit

## 2024-10-17 ENCOUNTER — Ambulatory Visit: Admitting: Psychiatry

## 2024-11-07 ENCOUNTER — Ambulatory Visit: Admitting: Internal Medicine
